# Patient Record
Sex: Female | Born: 1935 | Race: White | Hispanic: No | Marital: Single | Smoking: Former smoker
Health system: Southern US, Community
[De-identification: ages and names within clinical notes are randomized; demographics above are authoritative.]

## PROBLEM LIST (undated history)

## (undated) DIAGNOSIS — F32A Depression, unspecified: Secondary | ICD-10-CM

## (undated) DIAGNOSIS — G4700 Insomnia, unspecified: Secondary | ICD-10-CM

## (undated) DIAGNOSIS — F329 Major depressive disorder, single episode, unspecified: Secondary | ICD-10-CM

## (undated) DIAGNOSIS — L409 Psoriasis, unspecified: Secondary | ICD-10-CM

## (undated) DIAGNOSIS — I10 Essential (primary) hypertension: Secondary | ICD-10-CM

## (undated) DIAGNOSIS — M1990 Unspecified osteoarthritis, unspecified site: Secondary | ICD-10-CM

## (undated) DIAGNOSIS — Z8673 Personal history of transient ischemic attack (TIA), and cerebral infarction without residual deficits: Secondary | ICD-10-CM

## (undated) DIAGNOSIS — L309 Dermatitis, unspecified: Secondary | ICD-10-CM

## (undated) DIAGNOSIS — D649 Anemia, unspecified: Secondary | ICD-10-CM

## (undated) DIAGNOSIS — E039 Hypothyroidism, unspecified: Secondary | ICD-10-CM

## (undated) DIAGNOSIS — F319 Bipolar disorder, unspecified: Secondary | ICD-10-CM

## (undated) HISTORY — DX: Depression, unspecified: F32.A

## (undated) HISTORY — DX: Unspecified osteoarthritis, unspecified site: M19.90

## (undated) HISTORY — DX: Essential (primary) hypertension: I10

## (undated) HISTORY — DX: Hypothyroidism, unspecified: E03.9

## (undated) HISTORY — DX: Major depressive disorder, single episode, unspecified: F32.9

## (undated) HISTORY — PX: EYE SURGERY: SHX253

## (undated) HISTORY — DX: Insomnia, unspecified: G47.00

## (undated) HISTORY — DX: Personal history of transient ischemic attack (TIA), and cerebral infarction without residual deficits: Z86.73

## (undated) HISTORY — DX: Psoriasis, unspecified: L40.9

## (undated) HISTORY — PX: HERNIA REPAIR: SHX51

## (undated) HISTORY — DX: Bipolar disorder, unspecified: F31.9

## (undated) HISTORY — PX: TONSILLECTOMY: SUR1361

## (undated) HISTORY — DX: Dermatitis, unspecified: L30.9

---
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
## 1969-06-18 HISTORY — PX: TONSILLECTOMY: SHX5217

## 1969-06-18 HISTORY — PX: NASAL SEPTUM SURGERY: SHX37

## 1980-10-18 HISTORY — PX: ABDOMINAL HYSTERECTOMY: SHX81

## 2001-10-18 HISTORY — PX: APPENDECTOMY: SHX54

## 2003-09-27 ENCOUNTER — Observation Stay (HOSPITAL_COMMUNITY): Admission: RE | Admit: 2003-09-27 | Discharge: 2003-09-28 | Payer: Self-pay | Admitting: General Surgery

## 2003-09-28 ENCOUNTER — Emergency Department (HOSPITAL_COMMUNITY): Admission: EM | Admit: 2003-09-28 | Discharge: 2003-09-28 | Payer: Self-pay | Admitting: Emergency Medicine

## 2003-10-16 ENCOUNTER — Encounter: Admission: RE | Admit: 2003-10-16 | Discharge: 2003-10-16 | Payer: Self-pay | Admitting: General Surgery

## 2003-10-19 HISTORY — PX: VENTRAL HERNIA REPAIR: SHX424

## 2004-06-30 ENCOUNTER — Encounter: Admission: RE | Admit: 2004-06-30 | Discharge: 2004-06-30 | Payer: Self-pay | Admitting: Family Medicine

## 2004-11-30 ENCOUNTER — Emergency Department (HOSPITAL_COMMUNITY): Admission: EM | Admit: 2004-11-30 | Discharge: 2004-11-30 | Payer: Self-pay | Admitting: Emergency Medicine

## 2005-03-03 ENCOUNTER — Ambulatory Visit: Payer: Self-pay | Admitting: Internal Medicine

## 2005-06-09 ENCOUNTER — Ambulatory Visit: Payer: Self-pay | Admitting: Internal Medicine

## 2005-06-10 ENCOUNTER — Ambulatory Visit: Payer: Self-pay | Admitting: Internal Medicine

## 2005-06-22 ENCOUNTER — Ambulatory Visit: Payer: Self-pay | Admitting: Internal Medicine

## 2005-07-19 ENCOUNTER — Ambulatory Visit: Payer: Self-pay | Admitting: Internal Medicine

## 2005-10-05 ENCOUNTER — Ambulatory Visit: Payer: Self-pay | Admitting: Internal Medicine

## 2005-10-14 ENCOUNTER — Ambulatory Visit: Payer: Self-pay | Admitting: Internal Medicine

## 2005-10-15 ENCOUNTER — Encounter: Payer: Self-pay | Admitting: Cardiology

## 2005-10-15 ENCOUNTER — Ambulatory Visit: Payer: Self-pay

## 2005-12-23 ENCOUNTER — Ambulatory Visit: Payer: Self-pay | Admitting: Family Medicine

## 2006-08-29 ENCOUNTER — Ambulatory Visit: Payer: Self-pay | Admitting: Family Medicine

## 2006-09-06 ENCOUNTER — Ambulatory Visit: Payer: Self-pay | Admitting: Family Medicine

## 2006-09-06 LAB — CONVERTED CEMR LAB
ALT: 24 units/L (ref 0–40)
AST: 32 units/L (ref 0–37)
Albumin: 3.9 g/dL (ref 3.5–5.2)
Alkaline Phosphatase: 27 units/L — ABNORMAL LOW (ref 39–117)
BUN: 16 mg/dL (ref 6–23)
Basophils Absolute: 0 10*3/uL (ref 0.0–0.1)
Basophils Relative: 0.8 % (ref 0.0–1.0)
CO2: 29 meq/L (ref 19–32)
Calcium: 9.5 mg/dL (ref 8.4–10.5)
Chloride: 108 meq/L (ref 96–112)
Chol/HDL Ratio, serum: 3.7
Cholesterol: 185 mg/dL (ref 0–200)
Creatinine, Ser: 1 mg/dL (ref 0.4–1.2)
Eosinophil percent: 4.9 % (ref 0.0–5.0)
GFR calc non Af Amer: 58 mL/min
Glomerular Filtration Rate, Af Am: 70 mL/min/{1.73_m2}
Glucose, Bld: 88 mg/dL (ref 70–99)
HCT: 39.5 % (ref 36.0–46.0)
HDL: 50.2 mg/dL (ref >39.0)
Hemoglobin: 13.7 g/dL (ref 12.0–15.0)
LDL Cholesterol: 119 mg/dL — ABNORMAL HIGH (ref 0–99)
Lymphocytes Relative: 59.4 % — ABNORMAL HIGH (ref 12.0–46.0)
MCHC: 34.7 g/dL (ref 30.0–36.0)
MCV: 96.2 fL (ref 78.0–100.0)
Monocytes Absolute: 0.5 10*3/uL (ref 0.2–0.7)
Monocytes Relative: 9.1 % (ref 3.0–11.0)
Neutro Abs: 1.3 10*3/uL — ABNORMAL LOW (ref 1.4–7.7)
Neutrophils Relative %: 25.8 % — ABNORMAL LOW (ref 43.0–77.0)
Platelets: 203 10*3/uL (ref 150–400)
Potassium: 4.2 meq/L (ref 3.5–5.1)
RBC: 4.11 M/uL (ref 3.87–5.11)
RDW: 13.6 % (ref 11.5–14.6)
Sodium: 144 meq/L (ref 135–145)
TSH: 2.69 microintl units/mL (ref 0.35–5.50)
Total Bilirubin: 0.9 mg/dL (ref 0.3–1.2)
Total Protein: 7 g/dL (ref 6.0–8.3)
Triglyceride fasting, serum: 80 mg/dL (ref 0–149)
VLDL: 16 mg/dL (ref 0–40)
WBC: 5.1 10*3/uL (ref 4.5–10.5)

## 2006-09-14 ENCOUNTER — Encounter: Admission: RE | Admit: 2006-09-14 | Discharge: 2006-09-14 | Payer: Self-pay | Admitting: Family Medicine

## 2007-03-23 ENCOUNTER — Encounter: Payer: Self-pay | Admitting: Family Medicine

## 2007-05-22 ENCOUNTER — Telehealth (INDEPENDENT_AMBULATORY_CARE_PROVIDER_SITE_OTHER): Payer: Self-pay | Admitting: *Deleted

## 2007-08-11 ENCOUNTER — Telehealth (INDEPENDENT_AMBULATORY_CARE_PROVIDER_SITE_OTHER): Payer: Self-pay | Admitting: *Deleted

## 2007-08-15 ENCOUNTER — Telehealth (INDEPENDENT_AMBULATORY_CARE_PROVIDER_SITE_OTHER): Payer: Self-pay | Admitting: *Deleted

## 2007-09-01 ENCOUNTER — Ambulatory Visit: Payer: Self-pay | Admitting: Family Medicine

## 2007-09-01 DIAGNOSIS — M810 Age-related osteoporosis without current pathological fracture: Secondary | ICD-10-CM | POA: Insufficient documentation

## 2007-09-01 DIAGNOSIS — L259 Unspecified contact dermatitis, unspecified cause: Secondary | ICD-10-CM | POA: Insufficient documentation

## 2007-09-01 DIAGNOSIS — L409 Psoriasis, unspecified: Secondary | ICD-10-CM | POA: Insufficient documentation

## 2007-09-01 DIAGNOSIS — G4700 Insomnia, unspecified: Secondary | ICD-10-CM | POA: Insufficient documentation

## 2007-09-01 DIAGNOSIS — E039 Hypothyroidism, unspecified: Secondary | ICD-10-CM | POA: Insufficient documentation

## 2007-09-19 ENCOUNTER — Ambulatory Visit: Payer: Self-pay | Admitting: Pulmonary Disease

## 2007-09-20 ENCOUNTER — Encounter: Payer: Self-pay | Admitting: Pulmonary Disease

## 2007-09-23 ENCOUNTER — Encounter: Payer: Self-pay | Admitting: Pulmonary Disease

## 2007-09-27 ENCOUNTER — Encounter: Payer: Self-pay | Admitting: Family Medicine

## 2007-12-21 ENCOUNTER — Ambulatory Visit: Payer: Self-pay | Admitting: Family Medicine

## 2007-12-21 DIAGNOSIS — J342 Deviated nasal septum: Secondary | ICD-10-CM | POA: Insufficient documentation

## 2007-12-21 DIAGNOSIS — M1990 Unspecified osteoarthritis, unspecified site: Secondary | ICD-10-CM | POA: Insufficient documentation

## 2007-12-21 DIAGNOSIS — D126 Benign neoplasm of colon, unspecified: Secondary | ICD-10-CM | POA: Insufficient documentation

## 2007-12-21 DIAGNOSIS — F319 Bipolar disorder, unspecified: Secondary | ICD-10-CM | POA: Insufficient documentation

## 2007-12-21 DIAGNOSIS — K439 Ventral hernia without obstruction or gangrene: Secondary | ICD-10-CM | POA: Insufficient documentation

## 2007-12-25 ENCOUNTER — Encounter (INDEPENDENT_AMBULATORY_CARE_PROVIDER_SITE_OTHER): Payer: Self-pay | Admitting: *Deleted

## 2008-01-10 ENCOUNTER — Ambulatory Visit: Payer: Self-pay | Admitting: Gastroenterology

## 2008-02-02 ENCOUNTER — Encounter: Payer: Self-pay | Admitting: Family Medicine

## 2008-02-02 ENCOUNTER — Ambulatory Visit: Payer: Self-pay | Admitting: Gastroenterology

## 2008-02-02 ENCOUNTER — Encounter: Payer: Self-pay | Admitting: Gastroenterology

## 2008-03-07 ENCOUNTER — Telehealth (INDEPENDENT_AMBULATORY_CARE_PROVIDER_SITE_OTHER): Payer: Self-pay | Admitting: *Deleted

## 2008-04-01 ENCOUNTER — Encounter (INDEPENDENT_AMBULATORY_CARE_PROVIDER_SITE_OTHER): Payer: Self-pay | Admitting: *Deleted

## 2008-04-01 ENCOUNTER — Ambulatory Visit: Payer: Self-pay | Admitting: Family Medicine

## 2008-04-09 ENCOUNTER — Encounter (INDEPENDENT_AMBULATORY_CARE_PROVIDER_SITE_OTHER): Payer: Self-pay | Admitting: *Deleted

## 2008-04-22 ENCOUNTER — Telehealth (INDEPENDENT_AMBULATORY_CARE_PROVIDER_SITE_OTHER): Payer: Self-pay | Admitting: *Deleted

## 2008-04-23 ENCOUNTER — Ambulatory Visit: Payer: Self-pay | Admitting: Family Medicine

## 2008-04-23 DIAGNOSIS — N390 Urinary tract infection, site not specified: Secondary | ICD-10-CM | POA: Insufficient documentation

## 2008-04-23 LAB — CONVERTED CEMR LAB
Bilirubin Urine: NEGATIVE
Glucose, Urine, Semiquant: NEGATIVE
Ketones, urine, test strip: NEGATIVE
Nitrite: POSITIVE
Protein, U semiquant: 30
Specific Gravity, Urine: <1.005
Urobilinogen, UA: NEGATIVE
pH: 5.5

## 2008-04-25 ENCOUNTER — Telehealth (INDEPENDENT_AMBULATORY_CARE_PROVIDER_SITE_OTHER): Payer: Self-pay | Admitting: *Deleted

## 2008-07-03 ENCOUNTER — Encounter: Payer: Self-pay | Admitting: Family Medicine

## 2008-07-17 ENCOUNTER — Telehealth (INDEPENDENT_AMBULATORY_CARE_PROVIDER_SITE_OTHER): Payer: Self-pay | Admitting: *Deleted

## 2008-08-05 ENCOUNTER — Telehealth (INDEPENDENT_AMBULATORY_CARE_PROVIDER_SITE_OTHER): Payer: Self-pay | Admitting: *Deleted

## 2008-09-09 ENCOUNTER — Telehealth (INDEPENDENT_AMBULATORY_CARE_PROVIDER_SITE_OTHER): Payer: Self-pay | Admitting: *Deleted

## 2008-09-16 ENCOUNTER — Telehealth (INDEPENDENT_AMBULATORY_CARE_PROVIDER_SITE_OTHER): Payer: Self-pay | Admitting: *Deleted

## 2008-09-16 ENCOUNTER — Telehealth: Payer: Self-pay | Admitting: Family Medicine

## 2008-09-23 ENCOUNTER — Encounter: Payer: Self-pay | Admitting: Family Medicine

## 2008-09-24 ENCOUNTER — Telehealth (INDEPENDENT_AMBULATORY_CARE_PROVIDER_SITE_OTHER): Payer: Self-pay | Admitting: *Deleted

## 2008-10-18 HISTORY — PX: CATARACT EXTRACTION: SUR2

## 2008-10-23 ENCOUNTER — Telehealth (INDEPENDENT_AMBULATORY_CARE_PROVIDER_SITE_OTHER): Payer: Self-pay | Admitting: *Deleted

## 2008-11-25 ENCOUNTER — Ambulatory Visit: Payer: Self-pay | Admitting: Family Medicine

## 2008-11-25 DIAGNOSIS — J019 Acute sinusitis, unspecified: Secondary | ICD-10-CM | POA: Insufficient documentation

## 2008-11-25 DIAGNOSIS — J029 Acute pharyngitis, unspecified: Secondary | ICD-10-CM | POA: Insufficient documentation

## 2008-11-25 LAB — CONVERTED CEMR LAB: Rapid Strep: NEGATIVE

## 2008-12-25 ENCOUNTER — Telehealth (INDEPENDENT_AMBULATORY_CARE_PROVIDER_SITE_OTHER): Payer: Self-pay | Admitting: *Deleted

## 2008-12-30 ENCOUNTER — Ambulatory Visit: Payer: Self-pay | Admitting: Family Medicine

## 2008-12-30 LAB — CONVERTED CEMR LAB
ALT: 21 units/L (ref 0–35)
AST: 24 units/L (ref 0–37)
Albumin: 3.9 g/dL (ref 3.5–5.2)
Alkaline Phosphatase: 39 units/L (ref 39–117)
BUN: 14 mg/dL (ref 6–23)
Bilirubin, Direct: 0.2 mg/dL (ref 0.0–0.3)
CO2: 30 meq/L (ref 19–32)
Calcium: 9.7 mg/dL (ref 8.4–10.5)
Chloride: 105 meq/L (ref 96–112)
Cholesterol: 143 mg/dL (ref 0–200)
Creatinine, Ser: 0.8 mg/dL (ref 0.4–1.2)
GFR calc Af Amer: 90 mL/min
GFR calc non Af Amer: 75 mL/min
Glucose, Bld: 92 mg/dL (ref 70–99)
HDL: 42 mg/dL (ref >39.0)
Hgb A1c MFr Bld: 5.6 % (ref 4.6–6.0)
LDL Cholesterol: 82 mg/dL (ref 0–99)
Potassium: 4.1 meq/L (ref 3.5–5.1)
Sodium: 142 meq/L (ref 135–145)
Total Bilirubin: 1 mg/dL (ref 0.3–1.2)
Total CHOL/HDL Ratio: 3.4
Total Protein: 7.6 g/dL (ref 6.0–8.3)
Triglycerides: 95 mg/dL (ref 0–149)
VLDL: 19 mg/dL (ref 0–40)

## 2008-12-31 ENCOUNTER — Encounter (INDEPENDENT_AMBULATORY_CARE_PROVIDER_SITE_OTHER): Payer: Self-pay | Admitting: *Deleted

## 2008-12-31 LAB — CONVERTED CEMR LAB: Vit D, 25-Hydroxy: 88 ng/mL (ref 30–89)

## 2009-01-01 ENCOUNTER — Encounter (INDEPENDENT_AMBULATORY_CARE_PROVIDER_SITE_OTHER): Payer: Self-pay | Admitting: *Deleted

## 2009-01-20 ENCOUNTER — Telehealth: Payer: Self-pay | Admitting: Family Medicine

## 2009-05-07 ENCOUNTER — Telehealth (INDEPENDENT_AMBULATORY_CARE_PROVIDER_SITE_OTHER): Payer: Self-pay | Admitting: *Deleted

## 2009-05-09 ENCOUNTER — Ambulatory Visit: Payer: Self-pay | Admitting: Family Medicine

## 2009-05-09 DIAGNOSIS — R300 Dysuria: Secondary | ICD-10-CM | POA: Insufficient documentation

## 2009-05-09 DIAGNOSIS — N393 Stress incontinence (female) (male): Secondary | ICD-10-CM | POA: Insufficient documentation

## 2009-05-09 LAB — CONVERTED CEMR LAB
Bilirubin Urine: NEGATIVE
Glucose, Urine, Semiquant: NEGATIVE
Ketones, urine, test strip: NEGATIVE
Nitrite: NEGATIVE
Protein, U semiquant: NEGATIVE
Specific Gravity, Urine: <1.005
Urobilinogen, UA: 0.2
WBC Urine, dipstick: NEGATIVE
pH: 6

## 2009-05-12 ENCOUNTER — Telehealth (INDEPENDENT_AMBULATORY_CARE_PROVIDER_SITE_OTHER): Payer: Self-pay | Admitting: *Deleted

## 2009-05-19 ENCOUNTER — Telehealth (INDEPENDENT_AMBULATORY_CARE_PROVIDER_SITE_OTHER): Payer: Self-pay | Admitting: *Deleted

## 2009-05-22 ENCOUNTER — Telehealth (INDEPENDENT_AMBULATORY_CARE_PROVIDER_SITE_OTHER): Payer: Self-pay | Admitting: *Deleted

## 2009-05-23 ENCOUNTER — Ambulatory Visit: Payer: Self-pay | Admitting: Family Medicine

## 2009-05-23 ENCOUNTER — Telehealth (INDEPENDENT_AMBULATORY_CARE_PROVIDER_SITE_OTHER): Payer: Self-pay | Admitting: *Deleted

## 2009-05-23 LAB — CONVERTED CEMR LAB
Bilirubin Urine: NEGATIVE
Glucose, Urine, Semiquant: NEGATIVE
Ketones, urine, test strip: NEGATIVE
Nitrite: POSITIVE
Protein, U semiquant: NEGATIVE
Specific Gravity, Urine: <1.005
Urobilinogen, UA: 0.2
WBC Urine, dipstick: NEGATIVE
pH: 6.5

## 2009-05-24 ENCOUNTER — Encounter: Payer: Self-pay | Admitting: Family Medicine

## 2009-05-26 ENCOUNTER — Telehealth (INDEPENDENT_AMBULATORY_CARE_PROVIDER_SITE_OTHER): Payer: Self-pay | Admitting: *Deleted

## 2009-05-28 ENCOUNTER — Encounter (INDEPENDENT_AMBULATORY_CARE_PROVIDER_SITE_OTHER): Payer: Self-pay | Admitting: *Deleted

## 2009-05-28 ENCOUNTER — Telehealth (INDEPENDENT_AMBULATORY_CARE_PROVIDER_SITE_OTHER): Payer: Self-pay | Admitting: *Deleted

## 2009-05-30 ENCOUNTER — Telehealth (INDEPENDENT_AMBULATORY_CARE_PROVIDER_SITE_OTHER): Payer: Self-pay | Admitting: *Deleted

## 2009-06-02 ENCOUNTER — Ambulatory Visit: Payer: Self-pay | Admitting: Family Medicine

## 2009-06-02 DIAGNOSIS — R110 Nausea: Secondary | ICD-10-CM | POA: Insufficient documentation

## 2009-06-02 DIAGNOSIS — R109 Unspecified abdominal pain: Secondary | ICD-10-CM | POA: Insufficient documentation

## 2009-06-03 ENCOUNTER — Encounter: Admission: RE | Admit: 2009-06-03 | Discharge: 2009-06-03 | Payer: Self-pay | Admitting: Family Medicine

## 2009-06-05 ENCOUNTER — Encounter: Payer: Self-pay | Admitting: Family Medicine

## 2009-06-05 ENCOUNTER — Telehealth: Payer: Self-pay | Admitting: Family Medicine

## 2009-06-12 ENCOUNTER — Encounter (INDEPENDENT_AMBULATORY_CARE_PROVIDER_SITE_OTHER): Payer: Self-pay | Admitting: *Deleted

## 2009-07-21 ENCOUNTER — Encounter: Payer: Self-pay | Admitting: Family Medicine

## 2009-08-05 ENCOUNTER — Telehealth (INDEPENDENT_AMBULATORY_CARE_PROVIDER_SITE_OTHER): Payer: Self-pay | Admitting: *Deleted

## 2009-09-01 ENCOUNTER — Encounter: Payer: Self-pay | Admitting: Family Medicine

## 2009-11-03 ENCOUNTER — Telehealth (INDEPENDENT_AMBULATORY_CARE_PROVIDER_SITE_OTHER): Payer: Self-pay | Admitting: *Deleted

## 2009-11-04 ENCOUNTER — Ambulatory Visit: Payer: Self-pay | Admitting: Family Medicine

## 2009-11-04 DIAGNOSIS — R319 Hematuria, unspecified: Secondary | ICD-10-CM | POA: Insufficient documentation

## 2009-11-04 LAB — CONVERTED CEMR LAB
Bilirubin Urine: NEGATIVE
Glucose, Urine, Semiquant: NEGATIVE
Ketones, urine, test strip: NEGATIVE
Nitrite: NEGATIVE
Protein, U semiquant: NEGATIVE
Specific Gravity, Urine: 1.01
Urobilinogen, UA: 0.2
WBC Urine, dipstick: NEGATIVE
pH: 6

## 2009-11-05 ENCOUNTER — Encounter: Payer: Self-pay | Admitting: Family Medicine

## 2009-11-07 ENCOUNTER — Telehealth (INDEPENDENT_AMBULATORY_CARE_PROVIDER_SITE_OTHER): Payer: Self-pay | Admitting: *Deleted

## 2009-11-18 ENCOUNTER — Ambulatory Visit: Payer: Self-pay | Admitting: Family Medicine

## 2009-11-18 LAB — CONVERTED CEMR LAB
Bilirubin Urine: NEGATIVE
Glucose, Urine, Semiquant: NEGATIVE
Ketones, urine, test strip: NEGATIVE
Nitrite: NEGATIVE
Protein, U semiquant: NEGATIVE
Specific Gravity, Urine: <1.005
Urobilinogen, UA: 0.2
WBC Urine, dipstick: NEGATIVE
pH: 7

## 2009-11-19 ENCOUNTER — Encounter: Payer: Self-pay | Admitting: Family Medicine

## 2010-01-01 ENCOUNTER — Ambulatory Visit: Payer: Self-pay | Admitting: Family Medicine

## 2010-01-01 DIAGNOSIS — K644 Residual hemorrhoidal skin tags: Secondary | ICD-10-CM | POA: Insufficient documentation

## 2010-01-08 ENCOUNTER — Encounter (INDEPENDENT_AMBULATORY_CARE_PROVIDER_SITE_OTHER): Payer: Self-pay | Admitting: *Deleted

## 2010-01-08 ENCOUNTER — Ambulatory Visit: Payer: Self-pay | Admitting: Family Medicine

## 2010-01-08 LAB — CONVERTED CEMR LAB
OCCULT 1: NEGATIVE
OCCULT 2: NEGATIVE
OCCULT 3: NEGATIVE

## 2010-02-09 ENCOUNTER — Encounter: Payer: Self-pay | Admitting: Family Medicine

## 2010-02-23 ENCOUNTER — Ambulatory Visit: Payer: Self-pay | Admitting: Family Medicine

## 2010-02-23 DIAGNOSIS — K589 Irritable bowel syndrome without diarrhea: Secondary | ICD-10-CM | POA: Insufficient documentation

## 2010-02-23 LAB — CONVERTED CEMR LAB
Bilirubin Urine: NEGATIVE
Glucose, Urine, Semiquant: NEGATIVE
Ketones, urine, test strip: NEGATIVE
Nitrite: NEGATIVE
Protein, U semiquant: NEGATIVE
Specific Gravity, Urine: 1.015
Urobilinogen, UA: 0.2
WBC Urine, dipstick: NEGATIVE
pH: 7

## 2010-02-24 ENCOUNTER — Encounter: Payer: Self-pay | Admitting: Family Medicine

## 2010-04-10 ENCOUNTER — Ambulatory Visit: Payer: Self-pay | Admitting: Family Medicine

## 2010-04-10 LAB — CONVERTED CEMR LAB
Bilirubin Urine: NEGATIVE
Glucose, Urine, Semiquant: NEGATIVE
Ketones, urine, test strip: NEGATIVE
Nitrite: NEGATIVE
Protein, U semiquant: NEGATIVE
Specific Gravity, Urine: 1.015
Urobilinogen, UA: 0.2
WBC Urine, dipstick: NEGATIVE
pH: 6

## 2010-05-11 ENCOUNTER — Telehealth (INDEPENDENT_AMBULATORY_CARE_PROVIDER_SITE_OTHER): Payer: Self-pay | Admitting: *Deleted

## 2010-07-06 ENCOUNTER — Encounter: Payer: Self-pay | Admitting: Family Medicine

## 2010-09-02 ENCOUNTER — Telehealth (INDEPENDENT_AMBULATORY_CARE_PROVIDER_SITE_OTHER): Payer: Self-pay | Admitting: *Deleted

## 2010-09-04 ENCOUNTER — Encounter: Payer: Self-pay | Admitting: Family Medicine

## 2010-09-04 ENCOUNTER — Ambulatory Visit: Payer: Self-pay | Admitting: Family Medicine

## 2010-09-04 LAB — CONVERTED CEMR LAB
Bilirubin Urine: NEGATIVE
Glucose, Urine, Semiquant: NEGATIVE
Ketones, urine, test strip: NEGATIVE
Nitrite: NEGATIVE
Protein, U semiquant: NEGATIVE
Specific Gravity, Urine: <1.005
Urobilinogen, UA: NEGATIVE
WBC Urine, dipstick: NEGATIVE
pH: 7

## 2010-09-07 ENCOUNTER — Telehealth (INDEPENDENT_AMBULATORY_CARE_PROVIDER_SITE_OTHER): Payer: Self-pay | Admitting: *Deleted

## 2010-09-07 LAB — CONVERTED CEMR LAB
ALT: 24 units/L (ref 0–35)
AST: 27 units/L (ref 0–37)
Albumin: 4.3 g/dL (ref 3.5–5.2)
Alkaline Phosphatase: 33 units/L — ABNORMAL LOW (ref 39–117)
BUN: 11 mg/dL (ref 6–23)
Basophils Absolute: 0 10*3/uL (ref 0.0–0.1)
Basophils Relative: 0.3 % (ref 0.0–3.0)
Bilirubin, Direct: 0.1 mg/dL (ref 0.0–0.3)
CO2: 29 meq/L (ref 19–32)
Calcium: 9.5 mg/dL (ref 8.4–10.5)
Chloride: 99 meq/L (ref 96–112)
Cholesterol: 204 mg/dL — ABNORMAL HIGH (ref 0–200)
Creatinine, Ser: 0.8 mg/dL (ref 0.4–1.2)
Direct LDL: 123.7 mg/dL
Eosinophils Absolute: 0 10*3/uL (ref 0.0–0.7)
Eosinophils Relative: 0.5 % (ref 0.0–5.0)
GFR calc non Af Amer: 73.23 mL/min (ref >60)
Glucose, Bld: 96 mg/dL (ref 70–99)
HCT: 42.7 % (ref 36.0–46.0)
HDL: 64.8 mg/dL (ref >39.00)
Hemoglobin: 14.6 g/dL (ref 12.0–15.0)
Lymphocytes Relative: 16.4 % (ref 12.0–46.0)
Lymphs Abs: 1.6 10*3/uL (ref 0.7–4.0)
MCHC: 34.2 g/dL (ref 30.0–36.0)
MCV: 94.4 fL (ref 78.0–100.0)
Monocytes Absolute: 1 10*3/uL (ref 0.1–1.0)
Monocytes Relative: 10.2 % (ref 3.0–12.0)
Neutro Abs: 6.9 10*3/uL (ref 1.4–7.7)
Neutrophils Relative %: 72.6 % (ref 43.0–77.0)
Platelets: 250 10*3/uL (ref 150.0–400.0)
Potassium: 4.1 meq/L (ref 3.5–5.1)
RBC: 4.52 M/uL (ref 3.87–5.11)
RDW: 14 % (ref 11.5–14.6)
Sodium: 139 meq/L (ref 135–145)
TSH: 3.87 microintl units/mL (ref 0.35–5.50)
Total Bilirubin: 0.8 mg/dL (ref 0.3–1.2)
Total CHOL/HDL Ratio: 3
Total Protein: 7.4 g/dL (ref 6.0–8.3)
Triglycerides: 50 mg/dL (ref 0.0–149.0)
VLDL: 10 mg/dL (ref 0.0–40.0)
WBC: 9.5 10*3/uL (ref 4.5–10.5)

## 2010-10-02 ENCOUNTER — Ambulatory Visit: Payer: Self-pay | Admitting: Family Medicine

## 2010-10-02 ENCOUNTER — Encounter: Payer: Self-pay | Admitting: Family Medicine

## 2010-10-02 LAB — CONVERTED CEMR LAB
Bilirubin Urine: NEGATIVE
Blood in Urine, dipstick: NEGATIVE
Glucose, Urine, Semiquant: NEGATIVE
Ketones, urine, test strip: NEGATIVE
Nitrite: NEGATIVE
Protein, U semiquant: NEGATIVE
Specific Gravity, Urine: 1.015
Urobilinogen, UA: 0.2
WBC Urine, dipstick: NEGATIVE
pH: 6.5

## 2010-11-09 ENCOUNTER — Telehealth (INDEPENDENT_AMBULATORY_CARE_PROVIDER_SITE_OTHER): Payer: Self-pay | Admitting: *Deleted

## 2010-11-10 ENCOUNTER — Encounter: Payer: Self-pay | Admitting: Family Medicine

## 2010-11-10 ENCOUNTER — Telehealth (INDEPENDENT_AMBULATORY_CARE_PROVIDER_SITE_OTHER): Payer: Self-pay | Admitting: *Deleted

## 2010-11-15 LAB — CONVERTED CEMR LAB
ALT: 16 units/L (ref 0–35)
ALT: 16 units/L (ref 0–35)
ALT: 23 units/L (ref 0–35)
AST: 21 units/L (ref 0–37)
AST: 23 units/L (ref 0–37)
AST: 26 units/L (ref 0–37)
Albumin: 3.9 g/dL (ref 3.5–5.2)
Albumin: 4 g/dL (ref 3.5–5.2)
Albumin: 4 g/dL (ref 3.5–5.2)
Alkaline Phosphatase: 31 units/L — ABNORMAL LOW (ref 39–117)
Alkaline Phosphatase: 62 units/L (ref 39–117)
Alkaline Phosphatase: 65 units/L (ref 39–117)
BUN: 12 mg/dL (ref 6–23)
BUN: 18 mg/dL (ref 6–23)
Basophils Absolute: 0 10*3/uL (ref 0.0–0.1)
Basophils Absolute: 0.1 10*3/uL (ref 0.0–0.1)
Basophils Relative: 0 % (ref 0.0–3.0)
Basophils Relative: 0.9 % (ref 0.0–1.0)
Bilirubin, Direct: 0.1 mg/dL (ref 0.0–0.3)
Bilirubin, Direct: 0.1 mg/dL (ref 0.0–0.3)
Bilirubin, Direct: 0.1 mg/dL (ref 0.0–0.3)
CO2: 30 meq/L (ref 19–32)
CO2: 30 meq/L (ref 19–32)
Calcium: 10.3 mg/dL (ref 8.4–10.5)
Calcium: 9.4 mg/dL (ref 8.4–10.5)
Chloride: 105 meq/L (ref 96–112)
Chloride: 109 meq/L (ref 96–112)
Cholesterol: 203 mg/dL (ref 0–200)
Creatinine, Ser: 0.8 mg/dL (ref 0.4–1.2)
Creatinine, Ser: 0.9 mg/dL (ref 0.4–1.2)
Direct LDL: 134.2 mg/dL
Eosinophils Absolute: 0.1 10*3/uL (ref 0.0–0.7)
Eosinophils Absolute: 0.2 10*3/uL (ref 0.0–0.6)
Eosinophils Relative: 1.7 % (ref 0.0–5.0)
Eosinophils Relative: 2.1 % (ref 0.0–5.0)
Folate: 16.6 ng/mL
GFR calc Af Amer: 79 mL/min
GFR calc non Af Amer: 65 mL/min
GFR calc non Af Amer: 74.54 mL/min (ref >60)
Glucose, Bld: 101 mg/dL — ABNORMAL HIGH (ref 70–99)
Glucose, Bld: 87 mg/dL (ref 70–99)
HCT: 39.7 % (ref 36.0–46.0)
HCT: 41 % (ref 36.0–46.0)
HDL: 38.2 mg/dL — ABNORMAL LOW (ref >39.0)
Hemoglobin: 13.7 g/dL (ref 12.0–15.0)
Hemoglobin: 13.9 g/dL (ref 12.0–15.0)
Lymphocytes Relative: 25.6 % (ref 12.0–46.0)
Lymphocytes Relative: 30.7 % (ref 12.0–46.0)
Lymphs Abs: 1.9 10*3/uL (ref 0.7–4.0)
MCHC: 33.3 g/dL (ref 30.0–36.0)
MCHC: 35 g/dL (ref 30.0–36.0)
MCV: 93.3 fL (ref 78.0–100.0)
MCV: 94.6 fL (ref 78.0–100.0)
Monocytes Absolute: 0.2 10*3/uL (ref 0.1–1.0)
Monocytes Absolute: 0.8 10*3/uL — ABNORMAL HIGH (ref 0.2–0.7)
Monocytes Relative: 10 % (ref 3.0–11.0)
Monocytes Relative: 3.3 % (ref 3.0–12.0)
Neutro Abs: 4.4 10*3/uL (ref 1.4–7.7)
Neutro Abs: 5.2 10*3/uL (ref 1.4–7.7)
Neutrophils Relative %: 56.3 % (ref 43.0–77.0)
Neutrophils Relative %: 69.4 % (ref 43.0–77.0)
Platelets: 225 10*3/uL (ref 150.0–400.0)
Platelets: 257 10*3/uL (ref 150–400)
Potassium: 3.9 meq/L (ref 3.5–5.1)
Potassium: 4.5 meq/L (ref 3.5–5.1)
RBC: 4.26 M/uL (ref 3.87–5.11)
RBC: 4.33 M/uL (ref 3.87–5.11)
RDW: 12.7 % (ref 11.5–14.6)
RDW: 12.7 % (ref 11.5–14.6)
Sodium: 142 meq/L (ref 135–145)
Sodium: 143 meq/L (ref 135–145)
TSH: 1.19 microintl units/mL (ref 0.35–5.50)
TSH: 1.6 microintl units/mL (ref 0.35–5.50)
Total Bilirubin: 0.7 mg/dL (ref 0.3–1.2)
Total Bilirubin: 0.8 mg/dL (ref 0.3–1.2)
Total Bilirubin: 0.8 mg/dL (ref 0.3–1.2)
Total CHOL/HDL Ratio: 5.3
Total Protein: 7.1 g/dL (ref 6.0–8.3)
Total Protein: 7.5 g/dL (ref 6.0–8.3)
Total Protein: 7.6 g/dL (ref 6.0–8.3)
Triglycerides: 179 mg/dL — ABNORMAL HIGH (ref 0–149)
VLDL: 36 mg/dL (ref 0–40)
Vit D, 1,25-Dihydroxy: 43 (ref 30–89)
Vitamin B-12: 703 pg/mL (ref 211–911)
WBC: 7.4 10*3/uL (ref 4.5–10.5)
WBC: 8 10*3/uL (ref 4.5–10.5)

## 2010-11-17 NOTE — Letter (Signed)
Summary: Results Follow-up Letter   at [place]  [address] [place], [place] [address]   Phone: [phone]  Fax: [phone]    12/31/2008        [facility] [address] [place], [place]  [address]  Dear Ms. Soh,   The following are the results of your recent test(s):  Test     Result     Pap Smear    Normal_______  Not Normal_____       Comments: _________________________________________________________ Cholesterol LDL(Bad cholesterol):          Your goal is less than:         HDL (Good cholesterol):        Your goal is more than: _________________________________________________________ Other Tests:   _________________________________________________________  Please call for an appointment Or _PLEASE SEE ATTACHED LABWORK.________________________________________________________ _________________________________________________________ _________________________________________________________  Sincerely,  Ardyth Man  at [facility]

## 2010-11-17 NOTE — Letter (Signed)
Summary: Results Follow up Letter  [place] at [place]  [address] [place], [place] [address]   Phone: [phone]  Fax: [phone]    01/08/2010 MRN: [id number]  [facility] [address] [place], [place]  [address]  Dear Ms. Devries,  The following are the results of your recent test(s):  Test         Result    Pap Smear:        Normal _____  Not Normal _____ Comments: ______________________________________________________ Cholesterol: LDL(Bad cholesterol):         Your goal is less than:         HDL (Good cholesterol):       Your goal is more than: Comments:  ______________________________________________________ Mammogram:        Normal _____  Not Normal _____ Comments:  ___________________________________________________________________ Hemoccult:        Normal __X___  Not normal _______ Comments:    _____________________________________________________________________ Other Tests:    We routinely do not discuss normal results over the telephone.  If you desire a copy of the results, or you have any questions about this information we can discuss them at your next office visit.   Sincerely,   Army Fossa CMA  January 08, 2010 4:30 PM

## 2010-11-17 NOTE — Letter (Signed)
Summary: Results Follow-up Letter  [place] at [facility]  [address] [place], [place] [address]   Phone: [phone]  Fax: [phone]    01/01/2009         [name] [name] [address] [place], [place]  [address]  Dear Ms. Geffrard,   The following are the results of your recent test(s):  Test     Result     Pap Smear    Normal_______  Not Normal_____       Comments: _________________________________________________________ Cholesterol LDL(Bad cholesterol):          Your goal is less than:         HDL (Good cholesterol):        Your goal is more than: _________________________________________________________ Other Tests:   _________________________________________________________  Please call for an appointment Or Please see attached labwork. _________________________________________________________ _________________________________________________________  Sincerely,  Ardyth Man [place] at [facility]

## 2010-11-17 NOTE — Progress Notes (Signed)
  Phone Note Outgoing Call   Summary of Call: Left message for patient to call the office in regards to her append on the most recent lab. Ardyth Man  April 25, 2008 1:33 PM   Follow-up for Phone Call        patient aware to change abx from cipro to bactrim ds and is aware that rx sent to pharmacy Ardyth Man  April 25, 2008 3:28 PM  Follow-up by: Ardyth Man,  April 25, 2008 3:28 PM

## 2010-11-17 NOTE — Progress Notes (Signed)
Summary: lowne--rx  Phone Note Refill Request   Refills Requested: Medication #1:  FORTEO 750 MCG/3ML  SOLN SUBCUTANEOUS DAILY  Medication #2:  BD MINI PEN NDL 31 G X5MM as directed [id number]--[phone]  Initial call taken by: Freddy Jaksch,  September 24, 2008 8:43 AM      Prescriptions: BD MINI PEN NDL 70 G X5MM as directed  #3 months x 3   Entered by:   Kandice Hams   Authorized by:   Loreen Freud DO   Signed by:   Kandice Hams on 09/24/2008   Method used:   Printed then faxed to ...       [facility] [address] Environmental education officer)       [address]       [place], [place]  [address]       Ph: [phone]       Fax: [phone]   RxID:   [id number] FORTEO 750 MCG/3ML  SOLN (TERIPARATIDE (RECOMBINANT)) SUBCUTANEOUS DAILY  #3 pens x 3   Entered by:   Kandice Hams   Authorized by:   Loreen Freud DO   Signed by:   Kandice Hams on 09/24/2008   Method used:   Printed then faxed to ...       [facility] [address] Environmental education officer)       [address]       [place], [place]  [address]       Ph: [phone]       Fax: [phone]   RxID:   [id number]

## 2010-11-17 NOTE — Assessment & Plan Note (Signed)
Summary: acute cough,sore throat.cbs   Vital Signs:  Patient Profile:   77 Years Old Female Height:     66 inches Weight:      144.0 pounds O2 Sat:      96 % Temp:     98.0 degrees F oral Pulse rate:   89 / minute BP sitting:   150 / 78  (left arm)  Pt. in pain?   no  Vitals Entered By: Jeremy Johann CMA (November 25, 2008 10:49 AM)                  PCP:  Laury Axon  Chief Complaint:  nasal drainage, sore throat cough up yellow mucous x 4 days, and URI symptoms.  History of Present Illness:  URI Symptoms      This is a 77 year old woman who presents with URI symptoms.  The symptoms began duration > 3 days ago.  Pt here c/o congestion and ST since Thursday.  pt used neti pot with no relief.  The patient reports nasal congestion, purulent nasal discharge, sore throat, and productive cough, but denies sick contacts.  The patient denies fever, low-grade fever (<100.5 degrees), fever of 100.5-103 degrees, fever of 103.1-104 degrees, fever to >104 degrees, stiff neck, dyspnea, wheezing, rash, vomiting, diarrhea, use of an antipyretic, and response to antipyretic.  The patient also reports itchy throat and headache.  The patient denies itchy watery eyes, sneezing, seasonal symptoms, response to antihistamine, muscle aches, and severe fatigue.  The patient denies the following risk factors for Strep sinusitis: unilateral facial pain, unilateral nasal discharge, poor response to decongestant, double sickening, tooth pain, Strep exposure, tender adenopathy, and absence of cough.      Current Allergies: ! [facility]  Past Medical History:    Reviewed history from 12/21/2007 and no changes required:       HYPOTHYROIDISM (ICD-244.9)       INSOMNIA (ICD-780.52)       PSORIASIS (ICD-696.1)       ECZEMA (ICD-692.9)       OSTEOPOROSIS (ICD-733.00)       FAMILY HISTORY OF COLON CA 1ST DEGREE RELATIVE <60 (ICD-V16.0)       BIPOLAR       Osteoarthritis-- LOW BACK   Family History:  Reviewed history from 12/21/2007 and no changes required:       MOTHER:LIVING       FATHER:LIVING       4:BROTHERS-1LIVING       Family History of Colon CA 1st degree relative <60: BROTHER(DECEASED)       Family History Lung cancer: BROTHER(DECEASED), FATHER       Family History of Prostate CA 1st degree relative <50: FATHER       HEART:BROTHER(DECEASED)       Family History of Arthritis:FATHER       Family History of Aneurysm Aortic-- brother (deceased)  Social History:    Reviewed history from 12/21/2007 and no changes required:       Retired-- Engineer, 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[place]       Single       Former Smoker       Alcohol use-no       Drug use-no       Regular exercise-yes   Risk Factors: Tobacco use:  quit    Year quit:  age 39    Pack-years:  20-- 1ppd for 20 years Passive smoke exposure:  no Drug use:  no HIV high-risk behavior:  no Alcohol use:  no Exercise:  yes    Times per week:  7    Type:  WALKING,YOGA Seatbelt use:  100 %  Family History Risk Factors:    Family History of MI in females < 14 years old:  no    Family History of MI in males < 14 years old:  no  Colonoscopy History:    Date of Last Colonoscopy:  01/31/2008  Mammogram History:    Date of Last Mammogram:  11/02/2006   Review of Systems      See HPI   Physical Exam  General:     Well-developed,well-nourished,in no acute distress; alert,appropriate and cooperative throughout examination Ears:     External ear exam shows no significant lesions or deformities.  Otoscopic examination reveals clear canals, tympanic membranes are intact bilaterally without bulging, retraction, inflammation or discharge. Hearing is grossly normal bilaterally. Nose:     L frontal sinus tenderness and R frontal sinus tenderness.   Mouth:     pharyngeal erythema and postnasal drip.   Neck:     No deformities, masses, or tenderness noted. Lungs:     Normal respiratory effort, chest expands symmetrically. Lungs are clear to  auscultation, no crackles or wheezes. Heart:     normal rate, regular rhythm, and no murmur.   Psych:     Cognition and judgment appear intact. Alert and cooperative with normal attention span and concentration. No apparent delusions, illusions, hallucinations    Impression & Recommendations:  Problem # 1:  ACUTE PHARYNGITIS (ICD-462)  The following medications were removed from the medication list:    Bactrim Ds 800-160 Mg Tabs (Sulfamethoxazole-trimethoprim) ..... One tablet by mouth twice daily for 7 days.  Her updated medication list for this problem includes:    Augmentin 875-125 Mg Tabs (Amoxicillin-pot clavulanate) .Marland Kitchen... 1 by mouth two times a day  Orders: Rapid Strep ([address])   Problem # 2:  SINUSITIS- ACUTE-NOS (ICD-461.9)  The following medications were removed from the medication list:    Bactrim Ds 800-160 Mg Tabs (Sulfamethoxazole-trimethoprim) ..... One tablet by mouth twice daily for 7 days.  Her updated medication list for this problem includes:    Augmentin 875-125 Mg Tabs (Amoxicillin-pot clavulanate) .Marland Kitchen... 1 by mouth two times a day    Nasonex 50 Mcg/act Susp (Mometasone furoate) .Marland Kitchen... 2 sprays each nostril once daily Instructed on treatment. Call if symptoms persist or worsen.  Orders: Rapid Strep ([address])   Complete Medication List: 1)  Ambien 5 Mg Tabs (Zolpidem tartrate) .Marland Kitchen.. 1 by mouth at bedtime 2)  Vit-d 1000iu  .Marland Kitchen.. 1 by mouth once daily 3)  Mvi-centrum Silver  .Marland Kitchen.. 1 by mouth once daily 4)  Calcium 600mg   .... 1 by mouth two times a day 5)  Fish Oil 1000 Mg Caps (Omega-3 fatty acids) .Marland Kitchen.. 1 by mouth once daily 6)  Lutein 40 Mg Caps (Lutein) .Marland Kitchen.. 1 by mouth once daily 7)  Bd Mini Pen Ndl 31 G X56mm  .... As directed 8)  Alcohol Prep Pads  .... As directed 9)  Synthroid 75 Mcg Tabs (Levothyroxine sodium) .Marland Kitchen.. 1 by mouth once daily 10)  Abilify 2 Mg Tabs (Aripiprazole) .Marland Kitchen.. 1 by mouth once daily 11)  Zocor 40 Mg Tabs (Simvastatin) .... Take one  tablet each evening at bedtime. 12)  Zocor 40 Mg Tabs (Simvastatin) .Marland Kitchen.. 1 by mouth at bedtime 13)  Fosamax 70 Mg Tabs (Alendronate sodium) .... Take 1 tab weekly 14)  Augmentin 875-125 Mg Tabs (Amoxicillin-pot clavulanate) .Marland Kitchen.. 1 by  mouth two times a day 15)  Nasonex 50 Mcg/act Susp (Mometasone furoate) .... 2 sprays each nostril once daily    Prescriptions: NASONEX 50 MCG/ACT SUSP (MOMETASONE FUROATE) 2 sprays each nostril once daily  #1 x 0   Entered and Authorized by:   Loreen Freud DO   Signed by:   Loreen Freud DO on 11/25/2008   Method used:   Historical   RxID:   [id number] AUGMENTIN 875-125 MG TABS (AMOXICILLIN-POT CLAVULANATE) 1 by mouth two times a day  #20 x 0   Entered and Authorized by:   Loreen Freud DO   Signed by:   Loreen Freud DO on 11/25/2008   Method used:   [phone][facility]       [address]       [place], [place]  [address]       Ph: [phone]       Fax: [phone]   RxID:   [id number]   Laboratory Results  Date/Time Received: November 25, 2008 11:03 AM  Date/Time Reported: November 25, 2008 11:06 AM   Other Tests  Rapid Strep: negative

## 2010-11-17 NOTE — Procedures (Signed)
Summary: Colonoscopy  Colonoscopy   Imported By: Freddy Jaksch 02/12/2008 10:13:34  _____________________________________________________________________  External Attachment:    Type:   Image     Comment:   External Document  Appended Document: Colonoscopy    Clinical Lists Changes  Observations: Added new observation of COLONNXTDUE: 01/2013 (02/14/2008 17:57) Added new observation of COLONOSCOPY: polyp (01/31/2008 18:09)        Preventive Care Screening  Colonoscopy:    Date:  01/31/2008    Next Due:  01/2013    Results:  polyp

## 2010-11-17 NOTE — Progress Notes (Signed)
Summary: UTI   Phone Note Call from Patient   Caller: Patient Summary of Call: pt left VM that she would like a appt for today due to buring and frequency with urination. call pt back pt c/o burning and frequency with urination.. pt denies any fever, pain lower back or side or any  blood in urine. pt has pending OV for tomorrow but advised if symptoms worsen or fever occur advise UC or ED pt ok...............Marland KitchenFelecia Deloach CMA  November 03, 2009 1:24 PM   Initial call taken by: Jeremy Johann CMA,  November 03, 2009 12:29 PM

## 2010-11-17 NOTE — Progress Notes (Signed)
Summary: labs sched [phone]  Phone Note Call from Patient Call back at Home Phone [phone]   Caller: Patient Call For: Loreen Freud DO Summary of Call: Patient is requesting that we order labs to test her thyroid, blood sugar, and cholesterol.  Magdalen Spatz [facility]  December 25, 2008 2:04 PM  Initial call taken by: Magdalen Spatz [facility],  December 25, 2008 2:04 PM  Follow-up for Phone Call        Last labs 04/2008 and last seen 11/2008 for cough. Please advise. Ardyth Man  December 26, 2008 7:46 AM  Follow-up by: Ardyth Man,  December 26, 2008 7:46 AM  Additional Follow-up for Phone Call Additional follow up Details #1::        272.4  bmp, lipid, hep,  244.9  TSH,  vita d Additional Follow-up by: Loreen Freud DO,  December 26, 2008 9:37 AM    Additional Follow-up for Phone Call Additional follow up Details #2::    i called patient to schedule lab appt she asked if blood sugar was going to be checked - she said she takes ambilify & psy told her med could cause level to go up - do you want to add Follow-up by: Okey Regal [place],  December 26, 2008 3:49 PM  Additional Follow-up for Phone Call Additional follow up Details #3:: Details for Additional Follow-up Action Taken: Dr. Laury Axon, Please advise. Ardyth Man  December 26, 2008 3:59 PM Yes add [id number].   added to appt Additional Follow-up by: Okey Regal [place],  December 26, 2008 4:11 PM

## 2010-11-17 NOTE — Letter (Signed)
Summary: [facility]  [facility]   Imported By: Lanelle Bal 02/13/2010 14:10:40  _____________________________________________________________________  External Attachment:    Type:   Image     Comment:   External Document

## 2010-11-17 NOTE — Progress Notes (Signed)
Summary: rx [name] [name]- dr Darcus Austin RX? DR PAZ SEE  Phone Note Call from Patient Call back at Home Phone [phone]   Caller: Patient Summary of Call: pt wanted rx for mail order for  ambian 5mg  - per last refill in aug pt was to make appt - she was last seen 1107 -- patient made appt for [address]   - please fax 30 day supply [facility] [address]   ------patient aware she is to keep appt Initial call taken by: Okey Regal [place],  August 11, 2007 4:31 PM  Follow-up for Phone Call        ok to send a 30 day supply to local pharmacy....................................................................Jose E. Paz MD  August 12, 2007 11:04 AM  rx sent to [facility] pt aware Follow-up by: Kandice Hams,  August 14, 2007 9:24 AM      Prescriptions: AMBIEN 5 MG  TABS (ZOLPIDEM TARTRATE) 1 by mouth at bedtime  #30 x 0   Entered by:   Kandice Hams   Authorized by:   Nolon Rod. Paz MD   Signed by:   Kandice Hams on 08/14/2007   Method used:   Electronically sent to ...       [facility] [phone][facility]       [address]       [place], [place]  [address]       Ph: [phone]       Fax: [phone]   RxID:   [id number]

## 2010-11-17 NOTE — Progress Notes (Signed)
Summary: [facility]--refill  Phone Note Refill Request   Refills Requested: Medication #1:  FORTEO 750 MCG/3ML  SOLN SUBCUTANEOUS DAILY [facility]--[phone]  Initial call taken by: Freddy Jaksch,  September 16, 2008 8:39 AM      Prescriptions: FORTEO 750 MCG/3ML  SOLN (TERIPARATIDE (RECOMBINANT)) SUBCUTANEOUS DAILY  #3 pens x 3   Entered by:   Ardyth Man   Authorized by:   Loreen Freud DO   Signed by:   Ardyth Man on 09/16/2008   Method used:   Printed then faxed to ...       [facility] [phone][facility] (retail)       [address]       [place], [place]  [address]       Ph: [phone]       Fax: [phone]   RxID:   [id number]

## 2010-11-17 NOTE — Miscellaneous (Signed)
Summary: Waiver of Liability  Waiver of Liability   Imported By: Freddy Jaksch 04/18/2008 10:05:06  _____________________________________________________________________  External Attachment:    Type:   Image     Comment:   External Document

## 2010-11-17 NOTE — Assessment & Plan Note (Signed)
Summary: burning when urinating.cbs   Vital Signs:  Patient profile:   77 year old female Height:      66 inches Weight:      148.13 pounds BMI:     24.00 Pulse rate:   92 / minute Pulse rhythm:   regular BP sitting:   126 / 80  (left arm) Cuff size:   regular  Vitals Entered By: Army Fossa CMA (April 10, 2010 10:04 AM) CC: Pt here for lower back pain, frequency and burning with urination., Dysuria   History of Present Illness:  Dysuria      This is a 77 year old woman who presents with Dysuria.  The symptoms began 2 days ago.  The patient complains of burning with urination and urinary frequency, but denies urgency, hematuria, vaginal discharge, vaginal itching, vaginal sores, and penile discharge.  Associated symptoms include abdominal pain and back pain.  The patient denies the following associated symptoms: nausea, vomiting, fever, shaking chills, flank pain, pelvic pain, and arthralgias.  The patient denies the following risk factors: diabetes, prior antibiotics, immunosuppression, history of GU anomaly, history of pyelonephritis, pregnancy, history of STD, and analgesic abuse.  History is significant for recent UTI.    Current Medications (verified): 1)  Ambien 10 Mg Tabs (Zolpidem Tartrate) .Marland Kitchen.. 1 By Mouth At Bedtime 2)  Vit-D 1000iu .Marland Kitchen.. 1 By Mouth Once Daily 3)  Mvi-Centrum Silver .Marland Kitchen.. 1 By Mouth Once Daily 4)  Calcium 600mg  .... 1 By Mouth Two Times A Day 5)  Fish Oil 1000 Mg  Caps (Omega-3 Fatty Acids) .Marland Kitchen.. 1 By Mouth Once Daily 6)  Lutein 40 Mg  Caps (Lutein) .Marland Kitchen.. 1 By Mouth Once Daily 7)  Synthroid 75 Mcg  Tabs (Levothyroxine Sodium) .Marland Kitchen.. 1 By Mouth Once Daily 8)  Abilify 2 Mg  Tabs (Aripiprazole) .... 2  By Mouth Once Daily 9)  Zocor 40 Mg  Tabs (Simvastatin) .Marland Kitchen.. 1 By Mouth At Bedtime 10)  Fosamax 70 Mg Tabs (Alendronate Sodium) .... Take 1 Tab Weekly 11)  Nasonex 50 Mcg/act Susp (Mometasone Furoate) .... 2 Sprays Each Nostril Once Daily 12)  Aciphex 20 Mg Tbec  (Rabeprazole Sodium) .Marland Kitchen.. 1 By Mouth Once Daily 13)  Promethazine Hcl 25 Mg Tabs (Promethazine Hcl) .Marland Kitchen.. 1 By Mouth Qid As Needed 14)  Cipro 500 Mg Tabs (Ciprofloxacin Hcl) .Marland Kitchen.. 1 By Mouth Two Times A Day  Allergies: 1)  ! Wellbutrin 2)  ! * Nitrofurantoin  Past History:  Past medical, surgical, family and social histories (including risk factors) reviewed for relevance to current acute and chronic problems.  Past Medical History: Reviewed history from 12/21/2007 and no changes required. HYPOTHYROIDISM (ICD-244.9) INSOMNIA (ICD-780.52) PSORIASIS (ICD-696.1) ECZEMA (ICD-692.9) OSTEOPOROSIS (ICD-733.00) FAMILY HISTORY OF COLON CA 1ST DEGREE RELATIVE <60 (ICD-V16.0) BIPOLAR Osteoarthritis-- LOW BACK  Past Surgical History: Reviewed history from 12/21/2007 and no changes required. Appendectomy Hysterectomy,  partial Tonsillectomy ventral hernia Lumpectomy  Family History: Reviewed history from 12/21/2007 and no changes required. MOTHER:LIVING FATHER:LIVING 4:BROTHERS-1LIVING Family History of Colon CA 1st degree relative <60: BROTHER(DECEASED) Family History Lung cancer: BROTHER(DECEASED), FATHER Family History of Prostate CA 1st degree relative <50: FATHER HEART:BROTHER(DECEASED) Family History of Arthritis:FATHER Family History of Aneurysm Aortic-- brother (deceased)  Social History: Reviewed history from 12/21/2007 and no changes required. 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 no flank pain Psych:  Oriented X3 and normally interactive.     Impression & Recommendations:  Problem # 1:  DYSURIA (ICD-788.1)  The following  medications were removed from the medication list:    Bactrim Ds 800-160 Mg Tabs (Sulfamethoxazole-trimethoprim) .Marland Kitchen... Take 1 by mouth two times a day for 7 days Her updated medication list for this problem includes:    Cipro 500 Mg Tabs (Ciprofloxacin hcl) .Marland Kitchen... 1 by mouth two times a day  Orders: T-Culture, Urine ([id number])  Encouraged to push clear liquids, get enough rest, and take acetaminophen as needed. To be seen in 10 days if no improvement, sooner if worse.  Complete Medication List: 1)  Ambien 10 Mg Tabs (Zolpidem tartrate) .Marland Kitchen.. 1 by mouth at bedtime 2)  Vit-d 1000iu  .Marland Kitchen.. 1 by mouth once daily 3)  Mvi-centrum Silver  .Marland Kitchen.. 1 by mouth once daily 4)  Calcium 600mg   .... 1 by mouth two times a day 5)  Fish Oil 1000 Mg Caps (Omega-3 fatty acids) .Marland Kitchen.. 1 by mouth once daily 6)  Lutein 40 Mg Caps (Lutein) .Marland Kitchen.. 1 by mouth once daily 7)  Synthroid 75 Mcg Tabs (Levothyroxine sodium) .Marland Kitchen.. 1 by mouth once daily 8)  Abilify 2 Mg Tabs (Aripiprazole) .... 2  by mouth once daily 9)  Zocor 40 Mg Tabs (Simvastatin) .Marland Kitchen.. 1 by mouth at bedtime 10)  Fosamax 70 Mg Tabs (Alendronate sodium) .... Take 1 tab weekly 11)  Nasonex 50 Mcg/act Susp (Mometasone furoate) .... 2 sprays each nostril once daily 12)  Aciphex 20 Mg Tbec (Rabeprazole sodium) .Marland Kitchen.. 1 by mouth once daily 13)  Promethazine Hcl 25 Mg Tabs (Promethazine hcl) .Marland Kitchen.. 1 by mouth qid as needed 14)  Cipro 500 Mg Tabs (Ciprofloxacin hcl) .Marland Kitchen.. 1 by mouth two times a day Prescriptions: CIPRO 500 MG TABS (CIPROFLOXACIN HCL) 1 by mouth two times a day  #10 x 0   Entered and Authorized by:   Loreen Freud DO   Signed by:   Loreen Freud DO on 04/10/2010   Method used:   [facility]        [facility] [phone][facility] (retail)       [address]       [place], [place]  [address]       Ph: [phone]       Fax: [phone]   RxID:   [id number]   Laboratory Results   Urine Tests    Routine Urinalysis    Color: yellow Appearance: Clear Glucose: negative   (Normal Range: Negative) Bilirubin: negative   (Normal Range: Negative) Ketone: negative   (Normal Range: Negative) Spec. Gravity: 1.015   (Normal Range: 1.003-1.035) Blood: trace-lysed   (Normal Range: Negative) pH: 6.0   (Normal Range: 5.0-8.0) Protein: negative   (Normal Range: Negative) Urobilinogen: 0.2   (Normal Range: 0-1) Nitrite: negative   (Normal Range: Negative) Leukocyte Esterace: negative   (Normal Range: Negative)    Comments: Army Fossa CMA  April 10, 2010 10:13 AM

## 2010-11-17 NOTE — Consult Note (Signed)
Summary: [facility]  [facility]   Imported By: Lanelle Bal 06/12/2009 10:46:09  _____________________________________________________________________  External Attachment:    Type:   Image     Comment:   External Document

## 2010-11-17 NOTE — Progress Notes (Signed)
Summary: orders  Phone Note Call from Patient   Caller: Patient Summary of Call: pt  needs order for bone density  pls fax over the order for [facility]  [phone]   dec 7  Initial call taken by: [facility] CMA,  September 16, 2008 4:08 PM  Follow-up for Phone Call        dr lowne pls advise on orders for bone density Follow-up by: Jeremy Johann CMA,  September 16, 2008 4:53 PM  Additional Follow-up for Phone Call Additional follow up Details #1::        done Additional Follow-up by: Loreen Freud DO,  September 17, 2008 9:08 AM    Additional Follow-up for Phone Call Additional follow up Details #2::    LEFT DETAIL MESSAGE FOR PT THAT ORDER HAD BEEN DONE Follow-up by: Jeremy Johann CMA,  September 17, 2008 1:47 PM

## 2010-11-17 NOTE — Assessment & Plan Note (Signed)
Summary: UTI symptoms//lch   Vital Signs:  Patient profile:   77 year old female Weight:      147 pounds Pulse rate:   88 / minute Pulse rhythm:   regular BP sitting:   128 / 82  (left arm) Cuff size:   regular  Vitals Entered By: Army Fossa CMA (Feb 23, 2010 2:41 PM) CC: Pt here for burning and frequency when urinating onset friday, Dysuria   History of Present Illness:  Dysuria      This is a 77 year old woman who presents with Dysuria.  The symptoms began 4 days ago.  The patient complains of burning with urination, urinary frequency, and hematuria, but denies urgency, vaginal discharge, vaginal itching, vaginal sores, and penile discharge.  The patient denies the following associated symptoms: nausea, vomiting, fever, shaking chills, flank pain, abdominal pain, back pain, pelvic pain, and arthralgias.  The patient denies the following risk factors: diabetes, prior antibiotics, immunosuppression, history of GU anomaly, history of pyelonephritis, pregnancy, history of STD, and analgesic abuse.  History is significant for recent UTI.  Pt sees urology regularly for incontinence and UA done at every visit.   Current Medications (verified): 1)  Ambien 10 Mg Tabs (Zolpidem Tartrate) .Marland Kitchen.. 1 By Mouth At Bedtime 2)  Vit-D 1000iu .Marland Kitchen.. 1 By Mouth Once Daily 3)  Mvi-Centrum Silver .Marland Kitchen.. 1 By Mouth Once Daily 4)  Calcium 600mg  .... 1 By Mouth Two Times A Day 5)  Fish Oil 1000 Mg  Caps (Omega-3 Fatty Acids) .Marland Kitchen.. 1 By Mouth Once Daily 6)  Lutein 40 Mg  Caps (Lutein) .Marland Kitchen.. 1 By Mouth Once Daily 7)  Synthroid 75 Mcg  Tabs (Levothyroxine Sodium) .Marland Kitchen.. 1 By Mouth Once Daily 8)  Abilify 2 Mg  Tabs (Aripiprazole) .... 2  By Mouth Once Daily 9)  Zocor 40 Mg  Tabs (Simvastatin) .... Take One Tablet Each Evening At Bedtime. 10)  Zocor 40 Mg  Tabs (Simvastatin) .Marland Kitchen.. 1 By Mouth At Bedtime 11)  Fosamax 70 Mg Tabs (Alendronate Sodium) .... Take 1 Tab Weekly 12)  Nasonex 50 Mcg/act Susp (Mometasone Furoate)  .... 2 Sprays Each Nostril Once Daily 13)  Aciphex 20 Mg Tbec (Rabeprazole Sodium) .Marland Kitchen.. 1 By Mouth Once Daily 14)  Promethazine Hcl 25 Mg Tabs (Promethazine Hcl) .Marland Kitchen.. 1 By Mouth Qid As Needed 15)  Bactrim Ds 800-160 Mg Tabs (Sulfamethoxazole-Trimethoprim) .... Take 1 By Mouth Two Times A Day For 7 Days 16)  Bactrim Ds 800-160 Mg Tabs (Sulfamethoxazole-Trimethoprim) .Marland Kitchen.. 1 By Mouth Two Times A Day  Allergies: 1)  ! Wellbutrin 2)  ! * Nitrofurantoin  Past History:  Past medical, surgical, family and social histories (including risk factors) reviewed for relevance to current acute and chronic problems.  Past Medical History: Reviewed history from 12/21/2007 and no changes required. HYPOTHYROIDISM (ICD-244.9) INSOMNIA (ICD-780.52) PSORIASIS (ICD-696.1) ECZEMA (ICD-692.9) OSTEOPOROSIS (ICD-733.00) FAMILY HISTORY OF COLON CA 1ST DEGREE RELATIVE <60 (ICD-V16.0) BIPOLAR Osteoarthritis-- LOW BACK  Past Surgical History: Reviewed history from 12/21/2007 and no changes required. Appendectomy Hysterectomy,  partial Tonsillectomy ventral hernia Lumpectomy  Family History: Reviewed history from 12/21/2007 and no changes required. MOTHER:LIVING FATHER:LIVING 4:BROTHERS-1LIVING Family History of Colon CA 1st degree relative <60: BROTHER(DECEASED) Family History Lung cancer: BROTHER(DECEASED), FATHER Family History of Prostate CA 1st degree relative <50: FATHER HEART:BROTHER(DECEASED) Family History of Arthritis:FATHER Family History of Aneurysm Aortic-- brother (deceased)  Social History: Reviewed history from 12/21/2007 and no changes required. 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 Psych:  Oriented X3 and normally interactive.     Impression & Recommendations:  Problem # 1:  HEMATURIA UNSPECIFIED (ICD-599.70)  Her updated medication list for this problem includes:    Bactrim Ds 800-160 Mg Tabs (Sulfamethoxazole-trimethoprim) .Marland Kitchen... Take 1 by mouth two times a day for 7 days    Bactrim Ds 800-160 Mg Tabs (Sulfamethoxazole-trimethoprim) .Marland Kitchen... 1 by mouth two times a day  Orders: T-Culture, Urine ([id number]) UA Dipstick w/o Micro (manual) (09811)  Discussed medication use and hematuria work-up.   Problem # 2:  DYSURIA (ICD-788.1)  Her updated medication list for this problem includes:    Bactrim Ds 800-160 Mg Tabs (Sulfamethoxazole-trimethoprim) .Marland Kitchen... Take 1 by mouth two times a day for 7 days    Bactrim Ds 800-160 Mg Tabs (Sulfamethoxazole-trimethoprim) .Marland Kitchen... 1 by mouth two times a day  Orders: T-Culture, Urine ([id number]) UA Dipstick w/o Micro (manual) (13086)  Encouraged to push clear liquids, get enough rest, and take acetaminophen as needed. To be seen in 10 days if no improvement, sooner if worse.  Problem # 3:  IBS (ICD-564.1)  align daily if no relief ---refer to GI-- pt was reluctant to go yet she will call if symptoms persist  Orders: UA Dipstick w/o Micro (manual) (57846)  Complete Medication List: 1)  Ambien 10 Mg Tabs (Zolpidem tartrate) .Marland Kitchen.. 1 by mouth at bedtime 2)  Vit-d 1000iu  .Marland Kitchen.. 1 by mouth once daily 3)  Mvi-centrum Silver  .Marland Kitchen.. 1 by mouth once daily 4)  Calcium 600mg   .... 1 by mouth two times a day 5)  Fish Oil 1000 Mg Caps (Omega-3 fatty acids) .Marland Kitchen.. 1 by mouth once daily 6)  Lutein 40 Mg Caps (Lutein) .Marland Kitchen.. 1 by mouth once daily 7)  Synthroid 75 Mcg Tabs (Levothyroxine sodium) .Marland Kitchen.. 1 by mouth once daily 8)  Abilify 2 Mg Tabs (Aripiprazole) .... 2  by mouth once daily 9)  Zocor 40 Mg Tabs (Simvastatin) .... Take one tablet each evening at bedtime. 10)  Zocor 40 Mg Tabs (Simvastatin)  .Marland Kitchen.. 1 by mouth at bedtime 11)  Fosamax 70 Mg Tabs (Alendronate sodium) .... Take 1 tab weekly 12)  Nasonex 50 Mcg/act Susp (Mometasone furoate) .... 2 sprays each nostril once daily 13)  Aciphex 20 Mg Tbec (Rabeprazole sodium) .Marland Kitchen.. 1 by mouth once daily 14)  Promethazine Hcl 25 Mg Tabs (Promethazine hcl) .Marland Kitchen.. 1 by mouth qid as needed 15)  Bactrim Ds 800-160 Mg Tabs (Sulfamethoxazole-trimethoprim) .... Take 1 by mouth two times a day for 7 days 16)  Bactrim Ds 800-160 Mg Tabs (Sulfamethoxazole-trimethoprim) .Marland Kitchen.. 1 by mouth two times a day Prescriptions: BACTRIM DS 800-160 MG TABS (SULFAMETHOXAZOLE-TRIMETHOPRIM) 1 by mouth two times a day  #14 x 0   Entered and Authorized by:   Loreen Freud DO   Signed by:   Loreen Freud DO on 02/23/2010   Method used:   [facility]        [facility] [phone][facility] (retail[facility]       [address]       [place], [place]  [address]       Ph: [phone]       Fax: [phone]   RxID:  [id number]   Laboratory Results   Urine Tests    Routine Urinalysis   Color: yellow Appearance: Clear Glucose: negative   (Normal Range: Negative) Bilirubin: negative   (Normal Range: Negative) Ketone: negative   (Normal Range: Negative) Spec. Gravity: 1.015   (Normal Range: 1.003-1.035) Blood: trace-lysed   (Normal Range: Negative) pH: 7.0   (Normal Range: 5.0-8.0) Protein: negative   (Normal Range: Negative) Urobilinogen: 0.2   (Normal Range: 0-1) Nitrite: negative   (Normal Range: Negative) Leukocyte Esterace: negative   (Normal Range: Negative)    Comments: Army Fossa CMA  Feb 23, 2010 2:55 PM

## 2010-11-17 NOTE — Progress Notes (Signed)
Summary: no better  Phone Note Call from Patient   Summary of Call: PT LEFT MESSAGE ON MACHINE THAT SHE IS STILL NAUSEA AND FEELS THAT SHE HAS A FEVER. PT STATES THAT SHE HAS ALREADY TAKING SEVERAL ANTIBIOTIC AND HAS NOT HAD ANY RELIEF YET...............................Marland KitchenFelecia Deloach CMA  May 30, 2009 3:27 PM  Follow-up for Phone Call        is she still having urinary symptoms?  if yes--refer to urology---if only nausea---ov Follow-up by: Loreen Freud DO,  May 30, 2009 3:38 PM  Additional Follow-up for Phone Call Additional follow up Details #1::        pt aware....................Marland KitchenFelecia Deloach CMA  May 30, 2009 4:02 PM

## 2010-11-17 NOTE — Assessment & Plan Note (Signed)
Summary: acute/uti   Vital Signs:  Patient Profile:   77 Years Old Female Height:     66 inches Weight:      138 pounds Temp:     98.4 degrees F oral Pulse rate:   78 / minute Resp:     14 per minute BP sitting:   124 / 80  (left arm)  Pt. in pain?   no  Vitals Entered By: Ardyth Man (April 23, 2008 3:23 PM)                  PCP:  Laury Axon  Chief Complaint:  ?UTI and Dysuria.  History of Present Illness:  Dysuria      This is a 77 year old woman who presents with Dysuria.  The symptoms began duration > 3 days ago.  The patient reports burning with urination, urinary frequency, and urgency, but denies hematuria, vaginal discharge, vaginal itching, vaginal sores, and penile discharge.  Associated symptoms include flank pain.  The patient denies the following associated symptoms: nausea, vomiting, fever, shaking chills, abdominal pain, back pain, pelvic pain, and arthralgias.  The patient denies the following risk factors: diabetes, prior antibiotics, immunosuppression, history of GU anomaly, history of pyelonephritis, pregnancy, history of STD, and analgesic abuse.  History is significant for no urinary tract problems.      Current Allergies: ! [facility]  Past Medical History:    Reviewed history from 12/21/2007 and no changes required:       HYPOTHYROIDISM (ICD-244.9)       INSOMNIA (ICD-780.52)       PSORIASIS (ICD-696.1)       ECZEMA (ICD-692.9)       OSTEOPOROSIS (ICD-733.00)       FAMILY HISTORY OF COLON CA 1ST DEGREE RELATIVE <60 (ICD-V16.0)       BIPOLAR       Osteoarthritis-- LOW BACK  Past Surgical History:    Reviewed history from 12/21/2007 and no changes required:       Appendectomy       Hysterectomy,  partial       Tonsillectomy       ventral hernia       Lumpectomy   Family History:    Reviewed history from 12/21/2007 and no changes required:       MOTHER:LIVING       FATHER:LIVING       4:BROTHERS-1LIVING       Family History of Colon CA  1st degree relative <60: BROTHER(DECEASED)       Family History Lung cancer: BROTHER(DECEASED), FATHER       Family History of Prostate CA 1st degree relative <50: FATHER       HEART:BROTHER(DECEASED)       Family History of Arthritis:FATHER       Family History of Aneurysm Aortic-- brother (deceased)  Social History:    Reviewed history from 12/21/2007 and no changes required:       Retired-- Engineer, 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[place]       Single       Former Smoker       Alcohol use-no       Drug use-no       Regular exercise-yes   Risk Factors: Tobacco use:  quit    Year quit:  age 56    Pack-years:  20-- 1ppd for 20 years Passive smoke exposure:  no Drug use:  no HIV high-risk behavior:  no Alcohol use:  no Exercise:  yes  Times per week:  7    Type:  WALKING,YOGA Seatbelt use:  100 %  Family History Risk Factors:    Family History of MI in females < 71 years old:  no    Family History of MI in males < 56 years old:  no  Colonoscopy History:    Date of Last Colonoscopy:  01/31/2008  Mammogram History:    Date of Last Mammogram:  11/02/2006   Review of Systems      See HPI   Physical Exam  General:     Well-developed,well-nourished,in no acute distress; alert,appropriate and cooperative throughout examination Abdomen:     + suprapubic tenderness soft, normal bowel sounds, no distention, no masses, no guarding, no rigidity, and no rebound tenderness.   no flank tenderness Skin:     Intact without suspicious lesions or rashes    Impression & Recommendations:  Problem # 1:  UTI (ICD-599.0)  Her updated medication list for this problem includes:    Ciprofloxacin Hcl 500 Mg Tabs (Ciprofloxacin hcl) .Marland Kitchen... 1 by mouth two times a day  Orders: T-Culture, Urine ([id number])  Encouraged to push clear liquids, get enough rest, and take acetaminophen as needed. To be seen in 10 days if no improvement, sooner if worse.   Complete Medication List: 1)  Ambien 5 Mg Tabs  (Zolpidem tartrate) .Marland Kitchen.. 1 by mouth at bedtime 2)  Forteo 750 Mcg/60ml Soln (Teriparatide (recombinant)) .... subcutaneous daily 3)  Vit-d 1000iu  .Marland Kitchen.. 1 by mouth once daily 4)  Mvi-centrum Silver  .Marland Kitchen.. 1 by mouth once daily 5)  Calcium 600mg   .... 1 by mouth two times a day 6)  Fish Oil 1000 Mg Caps (Omega-3 fatty acids) .Marland Kitchen.. 1 by mouth once daily 7)  Lutein 40 Mg Caps (Lutein) .Marland Kitchen.. 1 by mouth once daily 8)  Bd Mini Pen Ndl 31 G X18mm  .... As directed 9)  Alcohol Prep Pads  .... As directed 10)  Synthroid 75 Mcg Tabs (Levothyroxine sodium) .Marland Kitchen.. 1 by mouth once daily 11)  Abilify 2 Mg Tabs (Aripiprazole) .Marland Kitchen.. 1 by mouth once daily 12)  Zocor 40 Mg Tabs (Simvastatin) .... Take one tablet each evening at bedtime. 13)  Ciprofloxacin Hcl 500 Mg Tabs (Ciprofloxacin hcl) .Marland Kitchen.. 1 by mouth two times a day 14)  Zocor 40 Mg Tabs (Simvastatin) .Marland Kitchen.. 1 by mouth at bedtime    Prescriptions: ZOCOR 40 MG  TABS (SIMVASTATIN) 1 by mouth at bedtime  #90 x 3   Entered and Authorized by:   Loreen Freud DO   Signed by:   Loreen Freud DO on 04/23/2008   Method used:   Print then Give to Patient   RxID:   [id number] CIPROFLOXACIN HCL 500 MG  TABS (CIPROFLOXACIN HCL) 1 by mouth two times a day  #10 x 0   Entered and Authorized by:   Loreen Freud DO   Signed by:   Loreen Freud DO on 04/23/2008   Method used:   Electronically sent to ...       [facility] [phone][facility]       [address]       [place], [place]  [address]       Ph: [phone]       Fax: [phone]   RxID:   [id number]  ] Laboratory Results   Urine Tests  Date/Time Recieved: Ardyth Man  April 23, 2008 3:28 PM  Date/Time Reported: [name] [name]  [name] [name]  April 23, 2008 3:28 PM   Routine Urinalysis   Color: straw Appearance: Hazy Glucose: negative   (Normal Range: Negative) Bilirubin: negative   (Normal Range: Negative) Ketone: negative   (Normal Range: Negative) Spec. Gravity: <1.005    (Normal Range: 1.003-1.035) Blood: moderate   (Normal Range: Negative) pH: 5.5   (Normal Range: 5.0-8.0) Protein: 30   (Normal Range: Negative) Urobilinogen: negative   (Normal Range: 0-1) Nitrite: positive   (Normal Range: Negative) Leukocyte Esterace: moderate   (Normal Range: Negative)

## 2010-11-17 NOTE — Progress Notes (Signed)
Summary: ?FORTEO &REFERAL - DR Laury Axon  Phone Note Call from Patient Call back at [facility] Phone [phone]   Caller: Patient Summary of Call: PATIENT FEELS SHE HAS BEEN ON FORTEO TWO YEARS - SHE IS TO BE TAKEING SHE NEEDS REFERRAL FOR BONE DENSITY- & INSTRUCTION  IF YOU FEEL SHE SHOULD STOP TAKING FORTEO Initial call taken by: Okey Regal [place],  September 09, 2008 10:55 AM  Follow-up for Phone Call        Dr. Laury Axon, Please advise. Felecia Deloach CMA  September 09, 2008 1:53 PM  Follow-up by: Jeremy Johann CMA,  September 09, 2008 1:53 PM  Additional Follow-up for Phone Call Additional follow up Details #1::        get bmd first then we will discuss options Additional Follow-up by: Loreen Freud DO,  September 09, 2008 3:52 PM    Additional Follow-up for Phone Call Additional follow up Details #2::    Patient aware, would like to use Yolanda Bonine. Ardyth Man  September 10, 2008 5:47 PM  Follow-up by: Ardyth Man,  September 10, 2008 5:47 PM

## 2010-11-17 NOTE — Letter (Signed)
Summary: [facility]  [facility]   Imported By: Lanelle Bal 08/01/2009 11:23:08  _____________________________________________________________________  External Attachment:    Type:   Image     Comment:   External Document

## 2010-11-17 NOTE — Progress Notes (Signed)
  Phone Note Call from Patient Call back at [facility] Phone [phone]   Caller: Patient Summary of Call: Patient calling and wanted to know where she had her Bone Density done. The [facility] center [facility] per chart. Patient aware Ardyth Man  August 05, 2008 11:38 AM  Initial call taken by: Ardyth Man,  August 05, 2008 11:38 AM

## 2010-11-17 NOTE — Progress Notes (Signed)
Summary: WANTS MORE ANTIBIOTICS  Phone Note Call from Patient Call back at [facility] Phone [phone]   Caller: Patient Call For: Loreen Freud DO Reason for Call: Talk to Nurse Summary of Call: PATIENT CALLING TO SEE IF DR. Laury Axon IS CALLING HER IN MORE ANTIBIOTICS FOR HER CONSTANT UTI.  HER APPT W/[facility] IS 06-18-09 & PT IS AWARE OF THAT. Initial call taken by: Magdalen Spatz [facility],  May 26, 2009 5:31 PM  Follow-up for Phone Call        done Follow-up by: Loreen Freud DO,  May 27, 2009 11:06 AM  Additional Follow-up for Phone Call Additional follow up Details #1::        pt aware rx sent to pharmacy..............Marland KitchenFelecia Deloach CMA  May 27, 2009 12:13 PM    New/Updated Medications: NITROFURANTOIN MACROCRYSTAL 50 MG CAPS (NITROFURANTOIN MACROCRYSTAL) 1 by mouth at bedtime Prescriptions: NITROFURANTOIN MACROCRYSTAL 50 MG CAPS (NITROFURANTOIN MACROCRYSTAL) 1 by mouth at bedtime  #30 x 0   Entered and Authorized by:   Loreen Freud DO   Signed by:   Loreen Freud DO on 05/27/2009   Method used:   Electronically to        [facility] [phone]* (retail)       [address]       [place], [place]  [address]       Ph: [phone]       Fax: [phone]   RxID:   [id number]

## 2010-11-17 NOTE — Progress Notes (Signed)
  Phone Note Refill Request   Refills Requested: Medication #1:  MACROBID 100 MG CAPS Take one tablet twice daily for 7 days./. pt states that she is still nausea, and having buring with urination and would like a refill on med.Marti Sleigh Deloach CMA  May 19, 2009 4:49 PM   Follow-up for Phone Call        last ov 05-09-09, last filled 05-12-09 #14.Marti Sleigh Deloach CMA  May 19, 2009 4:49 PM  Additional Follow-up for Phone Call Additional follow up Details #1::        refill x1---pt needs ov to recheck urine  Additional Follow-up by: Loreen Freud DO,  May 19, 2009 5:03 PM    Additional Follow-up for Phone Call Additional follow up Details #2::    pt aware rx sent to pharmacy....Marland KitchenMarland KitchenFelecia Deloach CMA  May 19, 2009 5:08 PM  Prescriptions: MACROBID 100 MG CAPS (NITROFURANTOIN MONOHYD MACRO) Take one tablet twice daily for 7 days./  #14 x 0   Entered by:   Jeremy Johann CMA   Authorized by:   Loreen Freud DO   Signed by:   Jeremy Johann CMA on 05/19/2009   Method used:   Faxed to ...       [facility] [phone]* (retail)       [address]       [place], [place]  [address]       Ph: [phone]       Fax: [phone]   RxID:   [id number]

## 2010-11-17 NOTE — Assessment & Plan Note (Signed)
Summary: nausea//fd   Vital Signs:  Patient profile:   77 year old female Height:      66 inches Weight:      147 pounds BMI:     23.81 Temp:     98.3 degrees F oral Pulse rate:   90 / minute BP sitting:   140 / 80  (left arm)  Vitals Entered By: Jeremy Johann CMA (June 02, 2009 2:08 PM) CC: nausea,weakness,tremorous   History of Present Illness: Pt here c/o nausea since finishing the cipro.  + urinary frequency but no other symptoms.   no diarrhea , no vomiting,  no fever.     Current Medications (verified): 1)  Ambien 5 Mg  Tabs (Zolpidem Tartrate) .Marland Kitchen.. 1 By Mouth At Bedtime 2)  Vit-D 1000iu .Marland Kitchen.. 1 By Mouth Once Daily 3)  Mvi-Centrum Silver .Marland Kitchen.. 1 By Mouth Once Daily 4)  Calcium 600mg  .... 1 By Mouth Two Times A Day 5)  Fish Oil 1000 Mg  Caps (Omega-3 Fatty Acids) .Marland Kitchen.. 1 By Mouth Once Daily 6)  Lutein 40 Mg  Caps (Lutein) .Marland Kitchen.. 1 By Mouth Once Daily 7)  Synthroid 75 Mcg  Tabs (Levothyroxine Sodium) .Marland Kitchen.. 1 By Mouth Once Daily 8)  Abilify 2 Mg  Tabs (Aripiprazole) .Marland Kitchen.. 1 By Mouth Once Daily 9)  Zocor 40 Mg  Tabs (Simvastatin) .... Take One Tablet Each Evening At Bedtime. 10)  Zocor 40 Mg  Tabs (Simvastatin) .Marland Kitchen.. 1 By Mouth At Bedtime 11)  Fosamax 70 Mg Tabs (Alendronate Sodium) .... Take 1 Tab Weekly 12)  Nasonex 50 Mcg/act Susp (Mometasone Furoate) .... 2 Sprays Each Nostril Once Daily 13)  Aciphex 20 Mg Tbec (Rabeprazole Sodium) .Marland Kitchen.. 1 By Mouth Once Daily 14)  Promethazine Hcl 25 Mg Tabs (Promethazine Hcl) .Marland Kitchen.. 1 By Mouth Qid As Needed  Allergies (verified): 1)  ! Wellbutrin  Past History:  Past medical, surgical, family and social histories (including risk factors) reviewed, and no changes noted (except as noted below).  Past Medical History: Reviewed history from 12/21/2007 and no changes required. HYPOTHYROIDISM (ICD-244.9) INSOMNIA (ICD-780.52) PSORIASIS (ICD-696.1) ECZEMA (ICD-692.9) OSTEOPOROSIS (ICD-733.00) FAMILY HISTORY OF COLON CA 1ST DEGREE RELATIVE  <60 (ICD-V16.0) BIPOLAR Osteoarthritis-- LOW BACK  Past Surgical History: Reviewed history from 12/21/2007 and no changes required. Appendectomy Hysterectomy,  partial Tonsillectomy ventral hernia Lumpectomy  Family History: Reviewed history from 12/21/2007 and no changes required. MOTHER:LIVING FATHER:LIVING 4:BROTHERS-1LIVING Family History of Colon CA 1st degree relative <60: BROTHER(DECEASED) Family History Lung cancer: BROTHER(DECEASED), FATHER Family History of Prostate CA 1st degree relative <50: FATHER HEART:BROTHER(DECEASED) Family History of Arthritis:FATHER Family History of Aneurysm Aortic-- brother (deceased)  Social History: Reviewed history from 12/21/2007 and no changes required. 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  Teeth in good repair. Neck:  No deformities, masses, or tenderness noted. Lungs:  Normal respiratory effort, chest expands symmetrically. Lungs are clear to auscultation, no crackles or wheezes. Heart:  Normal rate and regular rhythm. S1 and S2 normal without gallop, murmur, click, rub or other extra sounds. Abdomen:  Bowel sounds positive,abdomen soft and non-tender without masses, organomegaly or hernias noted. Extremities:  No clubbing, cyanosis, edema, or deformity noted with normal full range of motion of all joints.   Neurologic:  No cranial nerve  deficits noted. Station and gait are normal. Plantar reflexes are down-going bilaterally. DTRs are symmetrical throughout. Sensory, motor and coordinative functions appear intact. Skin:  Intact without suspicious lesions or rashes Cervical Nodes:  No lymphadenopathy noted Psych:  Cognition and judgment  appear intact. Alert and cooperative with normal attention span and concentration. No apparent delusions, illusions, hallucinations   Impression & Recommendations:  Problem # 1:  ABDOMINAL PAIN OTHER SPECIFIED SITE (ICD-789.09)  Orders: Venipuncture (29562) TLB-BMP (Basic Metabolic Panel-BMET) (80048-METABOL) TLB-CBC Platelet - w/Differential (85025-CBCD) TLB-Hepatic/Liver Function Pnl (80076-HEPATIC) TLB-TSH (Thyroid Stimulating Hormone) (84443-TSH) Venipuncture (13086) Radiology Referral (Radiology) [place]   Discussed use of medications, application of heat or cold, and exercises.   Problem # 2:  NAUSEA (ICD-787.02)  Orders: Venipuncture (57846) TLB-BMP (Basic Metabolic Panel-BMET) (80048-METABOL) TLB-CBC Platelet - w/Differential (85025-CBCD) TLB-Hepatic/Liver Function Pnl (80076-HEPATIC) TLB-TSH (Thyroid Stimulating Hormone) (84443-TSH) Venipuncture (96295) Radiology Referral (Radiology) Admin of Therapeutic Inj  intramuscular or subcutaneous (28413) Promethazine up to 50mg  (K4401)  Her updated medication list for this problem includes:    Promethazine Hcl 25 Mg Tabs (Promethazine hcl) .Marland Kitchen... 1 by mouth qid as needed  Discussed symptom control.   Problem # 3:  URINARY INCONTINENCE, STRESS, FEMALE (ICD-625.6) pt has urology appointment this week  Complete Medication List: 1)  Ambien 5 Mg Tabs (Zolpidem tartrate) .Marland Kitchen.. 1 by mouth at bedtime 2)  Vit-d 1000iu  .Marland Kitchen.. 1 by mouth once daily 3)  Mvi-centrum Silver  .Marland Kitchen.. 1 by mouth once daily 4)  Calcium 600mg   .... 1 by mouth two times a day 5)  Fish Oil 1000 Mg Caps (Omega-3 fatty acids) .Marland Kitchen.. 1 by mouth once daily 6)  Lutein 40 Mg Caps (Lutein) .Marland Kitchen.. 1 by mouth once daily 7)  Synthroid 75 Mcg Tabs (Levothyroxine sodium) .Marland Kitchen.. 1 by mouth once daily 8)  Abilify 2 Mg Tabs (Aripiprazole) .Marland Kitchen.. 1 by mouth once daily 9)  Zocor 40 Mg Tabs (Simvastatin) .... Take one tablet each evening at bedtime. 10)  Zocor 40 Mg Tabs (Simvastatin) .Marland Kitchen..  1 by mouth at bedtime 11)  Fosamax 70 Mg Tabs (Alendronate sodium) .... Take 1 tab weekly 12)  Nasonex 50 Mcg/act Susp (Mometasone furoate) .... 2 sprays each nostril once daily 13)  Aciphex 20 Mg Tbec (Rabeprazole sodium) .Marland Kitchen.. 1 by mouth once daily 14)  Promethazine Hcl 25 Mg Tabs (Promethazine hcl) .Marland Kitchen.. 1 by mouth qid as needed Prescriptions: PROMETHAZINE HCL 25 MG TABS (PROMETHAZINE HCL) 1 by mouth qid as needed  #20 x 0   Entered and Authorized by:   Loreen Freud DO   Signed by:   Loreen Freud DO on 06/02/2009   Method used:   [facility] to        [phone]ail[facility]       [address]       [place], [place]  [address]       Ph: [phone]       Fax: [phone]   RxID:   [id number]    Medication Administration  Injection # 1:    Medication: Promethazine up to 50mg     Diagnosis: NAUSEA (ICD-787.02)    Route: IM    Site: RUOQ gluteus    Exp Date: 09/17/2010    Lot #: [id number]    Mfr: [email or web address]    Comments: pt given 50 mg    Patient tolerated injection without complications    Given by: Jeremy Johann CMA (June 02, 2009 5:18 PM)  Orders Added: 1)  Venipuncture [36415] 2)  TLB-BMP (Basic Metabolic Panel-BMET) [80048-METABOL] 3)  TLB-CBC Platelet - w/Differential [85025-CBCD] 4)  TLB-Hepatic/Liver Function Pnl [80076-HEPATIC] 5)  TLB-TSH (Thyroid Stimulating Hormone) [84443-TSH] 6)  Venipuncture [44010] 7)  Radiology Referral [Radiology] 8)  Admin of Therapeutic Inj  intramuscular or subcutaneous [96372] 9)  Promethazine up to 50mg  [J2550] 10)  Est. Patient Level IV [27253]

## 2010-11-17 NOTE — Progress Notes (Signed)
Summary: [facility] Report Results  Phone Note Outgoing Call Call back at [facility] Phone [phone]   Call placed by: Shonna Chock,  June 05, 2009 1:53 PM Call placed to: Patient Summary of Call: Tried to call patient x 2, line busy.  how is pt feeling?  No acute changes on CT----  + complex cyst R kidney----radiologist recc.  CT abd in 6 months to evaluate further---- however if pt feeling no better may need to to ct now.    Chrae Malloy  June 05, 2009 1:57 PM   Follow-up for Phone Call        pt states that nausea is almost gone and the shot really help and she is doing much better now.Marti Sleigh Deloach CMA  June 05, 2009 4:40 PM

## 2010-11-17 NOTE — Assessment & Plan Note (Signed)
Summary: cpx//tl   Vital Signs:  Patient Profile:   77 Years Old Female Height:     66 inches Weight:      138.2 pounds Temp:     98.1 degrees F oral Pulse rate:   88 / minute Resp:     16 per minute BP sitting:   122 / 76  (left arm) Cuff size:   regular  Vitals Entered By: Shonna Chock (December 21, 2007 9:05 AM)                 PCP:  Laury Axon  Chief Complaint:  CPX WITH FASTING LABS and NO PAP.Marland Kitchen  History of Present Illness: Pt here for CPE and labs -- no pap.    Current Allergies: ! [facility]  Past Medical History:    Reviewed history from 09/23/2007 and no changes required:       HYPOTHYROIDISM (ICD-244.9)       INSOMNIA (ICD-780.52)       PSORIASIS (ICD-696.1)       ECZEMA (ICD-692.9)       OSTEOPOROSIS (ICD-733.00)       FAMILY HISTORY OF COLON CA 1ST DEGREE RELATIVE <60 (ICD-V16.0)       BIPOLAR       Osteoarthritis-- LOW BACK  Past Surgical History:    Reviewed history from 09/20/2007 and no changes required:       Appendectomy       Hysterectomy,  partial       Tonsillectomy       ventral hernia       Lumpectomy   Family History:    Reviewed history from 09/01/2007 and no changes required:       MOTHER:LIVING       FATHER:LIVING       4:BROTHERS-1LIVING       Family History of Colon CA 1st degree relative <60: BROTHER(DECEASED)       Family History Lung cancer: BROTHER(DECEASED), FATHER       Family History of Prostate CA 1st degree relative <50: FATHER       HEART:BROTHER(DECEASED)       Family History of Arthritis:FATHER       Family History of Aneurysm Aortic-- brother (deceased)  Social History:    Reviewed history and no changes required:       Retired-- Engineer, 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[place]       Single       Former Smoker       Alcohol use-no       Drug use-no       Regular exercise-yes   Risk Factors:  Tobacco use:  quit    Year quit:  age 65    Pack-years:  20-- 1ppd for 20 years Passive smoke exposure:  no Drug use:  no HIV high-risk  behavior:  no Alcohol use:  no Exercise:  yes Seatbelt use:  100 %  Family History Risk Factors:    Family History of MI in females < 49 years old:  no    Family History of MI in males < 27 years old:  no  Mammogram History:     Date of Last Mammogram:  11/02/2006    Results:  Normal Bilateral   Colonoscopy History:     Date of Last Colonoscopy:  11/01/2001    Results:  polyps    Review of Systems      See HPI  General      Denies chills, fatigue, fever, loss of appetite,  malaise, sleep disorder, sweats, weakness, and weight loss.  Eyes      Denies blurring, discharge, double vision, eye irritation, eye pain, halos, itching, light sensitivity, red eye, vision loss-1 eye, and vision loss-both eyes.      optho-q 1 year-- beg of cataract per pt per ophtho  ENT      Denies decreased hearing, difficulty swallowing, ear discharge, earache, hoarseness, nasal congestion, nosebleeds, postnasal drainage, ringing in ears, sinus pressure, and sore throat.      dentist q67m  CV      Denies bluish discoloration of lips or nails, chest pain or discomfort, difficulty breathing at night, difficulty breathing while lying down, fainting, fatigue, leg cramps with exertion, lightheadness, near fainting, palpitations, shortness of breath with exertion, swelling of feet, swelling of hands, and weight gain.  Resp      Denies chest discomfort, chest pain with inspiration, cough, coughing up blood, excessive snoring, hypersomnolence, morning headaches, pleuritic, shortness of breath, sputum productive, and wheezing.  GI      Denies abdominal pain, bloody stools, change in bowel habits, constipation, dark tarry stools, diarrhea, excessive appetite, gas, hemorrhoids, indigestion, loss of appetite, nausea, vomiting, vomiting blood, and yellowish skin color.  GU      Denies abnormal vaginal bleeding, decreased libido, discharge, dysuria, genital sores, hematuria, incontinence, nocturia, urinary frequency,  and urinary hesitancy.  MS      Denies joint pain, joint redness, joint swelling, loss of strength, low back pain, mid back pain, muscle aches, muscle , cramps, muscle weakness, stiffness, and thoracic pain.  Derm      derm- Dr Lucretia Roers  Neuro      Denies brief paralysis, difficulty with concentration, disturbances in coordination, falling down, headaches, inability to speak, memory loss, numbness, poor balance, seizures, sensation of room spinning, tingling, tremors, visual disturbances, and weakness.  Psych      Denies alternate hallucination ( auditory/visual), anxiety, depression, easily angered, easily tearful, irritability, mental problems, panic attacks, sense of great danger, suicidal thoughts/plans, thoughts of violence, unusual visions or sounds, and thoughts /plans of harming others.      psych q16m  Endo      Denies cold intolerance, excessive hunger, excessive thirst, excessive urination, heat intolerance, polyuria, and weight change.  Heme      Denies abnormal bruising, bleeding, enlarge lymph nodes, fevers, pallor, and skin discoloration.  Allergy      Denies hives or rash, itching eyes, persistent infections, seasonal allergies, and sneezing.  ENT      Denies decreased hearing, difficulty swallowing, ear discharge, earache, hoarseness, nasal congestion, nosebleeds, postnasal drainage, ringing in ears, sinus pressure, and sore throat.   Physical Exam  General:     Well-developed,well-nourished,in no acute distress; alert,appropriate and cooperative throughout examination Head:     Normocephalic and atraumatic without obvious abnormalities. No apparent alopecia or balding. Eyes:     vision grossly intact, pupils equal, pupils round, pupils reactive to light, and no injection.   Ears:     External ear exam shows no significant lesions or deformities.  Otoscopic examination reveals clear canals, tympanic membranes are intact bilaterally without bulging, retraction,  inflammation or discharge. Hearing is grossly normal bilaterally. Nose:     External nasal examination shows no deformity or inflammation. Nasal mucosa are pink and moist without lesions or exudates. Mouth:     Oral mucosa and oropharynx without lesions or exudates.  Teeth in good repair. Neck:     No deformities, masses, or tenderness noted.no carotid bruits  and no cervical lymphadenopathy.   Chest Wall:     No deformities, masses, or tenderness noted. Breasts:     No mass, nodules, thickening, tenderness, bulging, retraction, inflamation, nipple discharge or skin changes noted.   Lungs:     Normal respiratory effort, chest expands symmetrically. Lungs are clear to auscultation, no crackles or wheezes. Heart:     normal rate, regular rhythm, and no murmur.   Abdomen:     Bowel sounds positive,abdomen soft and non-tender without masses, organomegaly or hernias noted. Rectal:     gyn Genitalia:     gyn Msk:     normal ROM, no joint tenderness, no joint swelling, no joint warmth, no redness over joints, no joint deformities, no joint instability, no crepitation, and no muscle atrophy.   Pulses:     R carotid normal and L carotid normal.   Extremities:     No clubbing, cyanosis, edema, or deformity noted with normal full range of motion of all joints.   Neurologic:     No cranial nerve deficits noted. Station and gait are normal. Plantar reflexes are down-going bilaterally. DTRs are symmetrical throughout. Sensory, motor and coordinative functions appear intact. Skin:     Intact without suspicious lesions or rashes Cervical Nodes:     No lymphadenopathy noted Psych:     Cognition and judgment appear intact. Alert and cooperative with normal attention span and concentration. No apparent delusions, illusions, hallucinations    Impression & Recommendations:  Problem # 1:  PREVENTIVE HEALTH CARE (ICD-V70.0) Immun UTD Orders: Venipuncture (60454) TLB-B12 + Folate Pnl  (09811_91478-G95/AOZ) TLB-Lipid Panel (80061-LIPID) TLB-BMP (Basic Metabolic Panel-BMET) (80048-METABOL) TLB-CBC Platelet - w/Differential (85025-CBCD) TLB-Hepatic/Liver Function Pnl (80076-HEPATIC) TLB-TSH (Thyroid Stimulating Hormone) (84443-TSH) T-Vitamin D (25-Hydroxy) (30865-78469) UA Dipstick w/o Micro (manual) (62952) EKG w/ Interpretation (93000)   Problem # 2:  BIPOLAR DISORDER UNSPECIFIED (ICD-296.80) per psych Orders: Venipuncture (84132) TLB-B12 + Folate Pnl (44010_27253-G64/QIH) TLB-Lipid Panel (80061-LIPID) TLB-BMP (Basic Metabolic Panel-BMET) (80048-METABOL) TLB-CBC Platelet - w/Differential (85025-CBCD) TLB-Hepatic/Liver Function Pnl (80076-HEPATIC) TLB-TSH (Thyroid Stimulating Hormone) (84443-TSH) T-Vitamin D (25-Hydroxy) (47425-95638) UA Dipstick w/o Micro (manual) (75643) EKG w/ Interpretation (93000)   Problem # 3:  COLONIC POLYPS (ICD-211.3)  Orders: Gastroenterology Referral (GI) UA Dipstick w/o Micro (manual) (32951) EKG w/ Interpretation (93000)   Problem # 4:  HYPOTHYROIDISM (ICD-244.9)  Her updated medication list for this problem includes:    Synthroid 75 Mcg Tabs (Levothyroxine sodium) .Marland Kitchen... 1 by mouth once daily  Orders: Venipuncture (88416) TLB-B12 + Folate Pnl (60630_16010-X32/TFT) TLB-Lipid Panel (80061-LIPID) TLB-BMP (Basic Metabolic Panel-BMET) (80048-METABOL) TLB-CBC Platelet - w/Differential (85025-CBCD) TLB-Hepatic/Liver Function Pnl (80076-HEPATIC) TLB-TSH (Thyroid Stimulating Hormone) (84443-TSH) T-Vitamin D (25-Hydroxy) (73220-25427) UA Dipstick w/o Micro (manual) (06237) EKG w/ Interpretation (93000)  Labs Reviewed: TSH: 2.69 (09/06/2006)    Chol: 185 (09/06/2006)   HDL: 50.2 (09/06/2006)   LDL: 119 (09/06/2006)   TG: 80 (09/06/2006)   Problem # 5:  OSTEOPOROSIS (ICD-733.00)  Her updated medication list for this problem includes:    Forteo 750 Mcg/33ml Soln (Teriparatide (recombinant)) .Marland Kitchen... subcutaneous  daily  Orders: Venipuncture (62831) TLB-B12 + Folate Pnl (51761_60737-T06/YIR) TLB-Lipid Panel (80061-LIPID) TLB-BMP (Basic Metabolic Panel-BMET) (80048-METABOL) TLB-CBC Platelet - w/Differential (85025-CBCD) TLB-Hepatic/Liver Function Pnl (80076-HEPATIC) TLB-TSH (Thyroid Stimulating Hormone) (84443-TSH) T-Vitamin D (25-Hydroxy) (48546-27035) UA Dipstick w/o Micro (manual) (00938) EKG w/ Interpretation (93000)   Problem # 6:  PSORIASIS (ICD-696.1) per derm Orders: Venipuncture (18299) TLB-B12 + Folate Pnl (37169_67893-Y10/FBP) TLB-Lipid Panel (80061-LIPID) TLB-BMP (Basic Metabolic Panel-BMET) (80048-METABOL) TLB-CBC Platelet - w/Differential (  85025-CBCD) TLB-Hepatic/Liver Function Pnl (80076-HEPATIC) TLB-TSH (Thyroid Stimulating Hormone) (84443-TSH) T-Vitamin D (25-Hydroxy) (09811-91478) UA Dipstick w/o Micro (manual) (29562) EKG w/ Interpretation (93000)   Complete Medication List: 1)  Ambien 5 Mg Tabs (Zolpidem tartrate) .Marland Kitchen.. 1 by mouth at bedtime 2)  Forteo 750 Mcg/50ml Soln (Teriparatide (recombinant)) .... subcutaneous daily 3)  Vit-d 1000iu  .Marland Kitchen.. 1 by mouth once daily 4)  Mvi-centrum Silver  .Marland Kitchen.. 1 by mouth once daily 5)  Calcium 600mg   .... 1 by mouth two times a day 6)  Fish Oil 1000 Mg Caps (Omega-3 fatty acids) .Marland Kitchen.. 1 by mouth once daily 7)  Lutein 40 Mg Caps (Lutein) .Marland Kitchen.. 1 by mouth once daily 8)  Bd Mini Pen Ndl 31 G X82mm  .... As directed 9)  Alcohol Prep Pads  .... As directed 10)  Synthroid 75 Mcg Tabs (Levothyroxine sodium) .Marland Kitchen.. 1 by mouth once daily 11)  Abilify 2 Mg Tabs (Aripiprazole) .Marland Kitchen.. 1 by mouth once daily     ]  Tetanus/Td Immunization History:    Tetanus/Td # 1:  Td (10/18/1998)  Pneumovax Immunization History:    Pneumovax # 1:  Pneumovax (Medicare) (08/08/2002)  Other Immunization History:    Zostavax # 1:  Zostavax (09/28/2006)    Echocardiogram  Procedure date:  12/21/2007  Findings:      sinus rhythm 93 bpm

## 2010-11-17 NOTE — Progress Notes (Signed)
  Phone Note Outgoing Call Call back at [facility] Phone [phone]   Call placed by: Ardyth Man,  May 12, 2009 3:08 PM Call placed to: Patient Summary of Call: Patient aware of uti and macrobid sent to pharmacy Ardyth Man  May 12, 2009 3:08 PM

## 2010-11-17 NOTE — Progress Notes (Signed)
Summary: lmtc 1-21  Phone Note Outgoing Call   Call placed by: Jeremy Johann CMA,  November 07, 2009 10:13 AM Summary of Call: left message to call  office..................Marland KitchenFelecia Deloach CMA  November 07, 2009 10:13 AM   + UTI---resistant to cipro-----bactrim ds 1 by mouth two times a day for 7 days  recheck 2 weeks  Follow-up for Phone Call        Pt is aware to stop cipro and pick up bactrim. Army Fossa CMA  November 07, 2009 2:22 PM     New/Updated Medications: BACTRIM DS 800-160 MG TABS (SULFAMETHOXAZOLE-TRIMETHOPRIM) take 1 by mouth two times a day for 7 days Prescriptions: BACTRIM DS 800-160 MG TABS (SULFAMETHOXAZOLE-TRIMETHOPRIM) take 1 by mouth two times a day for 7 days  #14 x 0   Entered by:   Army Fossa CMA   Authorized by:   Loreen Freud DO   Signed by:   Army Fossa CMA on 11/07/2009   Method used:   [facility]        [phone][facility] ([facility]ail[facility]       [address]       [place], [place]  [address]       Ph: [phone]       Fax: [phone]   RxID:   [id number]

## 2010-11-17 NOTE — Assessment & Plan Note (Signed)
Summary: frequency urination/alr  ns/alr   Vital Signs:  Patient profile:   77 year old female Height:      66 inches Weight:      148 pounds Temp:     98.3 degrees F oral Pulse rate:   86 / minute BP sitting:   160 / 90  (left arm)  Vitals Entered By: Jeremy Johann CMA (May 09, 2009 2:04 PM) CC: constant urge to urinate   History of Present Illness: Pt here c/o urinary incontinence ----+ frequent urination ---up several times at night.  She is going on a cruise with a friend and is afraid it will be a problem.  Her friend takes enablex and she would like to try this.   No dysuria,  back pain or blood in urine.   Current Medications (verified): 1)  Ambien 5 Mg  Tabs (Zolpidem Tartrate) .Marland Kitchen.. 1 By Mouth At Bedtime 2)  Vit-D 1000iu .Marland Kitchen.. 1 By Mouth Once Daily 3)  Mvi-Centrum Silver .Marland Kitchen.. 1 By Mouth Once Daily 4)  Calcium 600mg  .... 1 By Mouth Two Times A Day 5)  Fish Oil 1000 Mg  Caps (Omega-3 Fatty Acids) .Marland Kitchen.. 1 By Mouth Once Daily 6)  Lutein 40 Mg  Caps (Lutein) .Marland Kitchen.. 1 By Mouth Once Daily 7)  Synthroid 75 Mcg  Tabs (Levothyroxine Sodium) .Marland Kitchen.. 1 By Mouth Once Daily 8)  Abilify 2 Mg  Tabs (Aripiprazole) .Marland Kitchen.. 1 By Mouth Once Daily 9)  Zocor 40 Mg  Tabs (Simvastatin) .... Take One Tablet Each Evening At Bedtime. 10)  Zocor 40 Mg  Tabs (Simvastatin) .Marland Kitchen.. 1 By Mouth At Bedtime 11)  Fosamax 70 Mg Tabs (Alendronate Sodium) .... Take 1 Tab Weekly 12)  Nasonex 50 Mcg/act Susp (Mometasone Furoate) .... 2 Sprays Each Nostril Once Daily 13)  Enablex 7.5 Mg Xr24h-Tab (Darifenacin Hydrobromide) .Marland Kitchen.. 1 By Mouth Once Daily  Allergies: 1)  ! Wellbutrin PMH-FH-SH reviewed-no changes except otherwise noted  Physical Exam  General:  Well-developed,well-nourished,in no acute distress; alert,appropriate and cooperative throughout examination Psych:  Oriented X3, normally interactive, and good eye contact.     Impression & Recommendations:  Problem # 1:  URINARY INCONTINENCE, STRESS, FEMALE  (ICD-625.6) enablex 7.5 1 by mouth once daily ----after 2 weeks can increase to 15 mg as needed  urology if no better  Problem # 2:  DYSURIA (ICD-788.1)  Her updated medication list for this problem includes:    Enablex 7.5 Mg Xr24h-tab (Darifenacin hydrobromide) .Marland Kitchen... 1 by mouth once daily  Orders: T-Culture, Urine ([id number])  Encouraged to push clear liquids, get enough rest, and take acetaminophen as needed. To be seen in 10 days if no improvement, sooner if worse.  Complete Medication List: 1)  Ambien 5 Mg Tabs (Zolpidem tartrate) .Marland Kitchen.. 1 by mouth at bedtime 2)  Vit-d 1000iu  .Marland Kitchen.. 1 by mouth once daily 3)  Mvi-centrum Silver  .Marland Kitchen.. 1 by mouth once daily 4)  Calcium 600mg   .... 1 by mouth two times a day 5)  Fish Oil 1000 Mg Caps (Omega-3 fatty acids) .Marland Kitchen.. 1 by mouth once daily 6)  Lutein 40 Mg Caps (Lutein) .Marland Kitchen.. 1 by mouth once daily 7)  Synthroid 75 Mcg Tabs (Levothyroxine sodium) .Marland Kitchen.. 1 by mouth once daily 8)  Abilify 2 Mg Tabs (Aripiprazole) .Marland Kitchen.. 1 by mouth once daily 9)  Zocor 40 Mg Tabs (Simvastatin) .... Take one tablet each evening at bedtime. 10)  Zocor 40 Mg Tabs (Simvastatin) .Marland Kitchen.. 1 by mouth at bedtime 11)  Fosamax  70 Mg Tabs (Alendronate sodium) .... Take 1 tab weekly 12)  Nasonex 50 Mcg/act Susp (Mometasone furoate) .... 2 sprays each nostril once daily 13)  Enablex 7.5 Mg Xr24h-tab (Darifenacin hydrobromide) .Marland Kitchen.. 1 by mouth once daily  Laboratory Results   Urine Tests   Date/Time Reported: May 09, 2009 2:18 PM  Routine Urinalysis   Color: yellow Appearance: Clear Glucose: negative   (Normal Range: Negative) Bilirubin: negative   (Normal Range: Negative) Ketone: negative   (Normal Range: Negative) Spec. Gravity: <1.005   (Normal Range: 1.003-1.035) Blood: trace-lysed   (Normal Range: Negative) pH: 6.0   (Normal Range: 5.0-8.0) Protein: negative   (Normal Range: Negative) Urobilinogen: 0.2   (Normal Range: 0-1) Nitrite: negative   (Normal Range:  Negative) Leukocyte Esterace: negative   (Normal Range: Negative)

## 2010-11-17 NOTE — Letter (Signed)
Summary: Results Follow-up Letter  [place] [facility]  [address] [place], [place] [address]   Phone: [phone]  Fax: [phone]    06/12/2009        [name] [name] [address] [place], [place]  [address]  Dear [name],   The following are the results of your recent test(s):  Test     Result     Pap Smear    Normal_______  Not Normal_____       Comments: _________________________________________________________ Cholesterol LDL(Bad cholesterol):          Your goal is less than:         HDL (Good cholesterol):        Your goal is more than: _________________________________________________________ Other Tests:   _________________________________________________________  Please call for an appointment Or Please see attached labs.___________________________________________________ _________________________________________________________ _________________________________________________________  Sincerely,  Felecia Deloach CMA [place] [facility]

## 2010-11-17 NOTE — Assessment & Plan Note (Signed)
Summary: urinary frequency/kdc   Vital Signs:  Patient profile:   77 year old female Weight:      146 pounds Temp:     98.0 degrees F oral Pulse rate:   96 / minute Pulse rhythm:   regular BP sitting:   140 / 86  (left arm) Cuff size:   regular  Vitals Entered By: Army Fossa CMA (November 04, 2009 11:32 AM) CC: Urinary frequency, burning x 3 days , Dysuria   History of Present Illness:  Dysuria      This is a 77 year old woman who presents with Dysuria.  The symptoms began 3 days ago.  The patient complains of burning with urination and urinary frequency, but denies urgency, hematuria, vaginal discharge, vaginal itching, vaginal sores, and penile discharge.  The patient denies the following associated symptoms: nausea, vomiting, fever, shaking chills, flank pain, abdominal pain, back pain, pelvic pain, and arthralgias.  The patient denies the following risk factors: diabetes, prior antibiotics, immunosuppression, history of GU anomaly, history of pyelonephritis, pregnancy, history of STD, and analgesic abuse.    Allergies: 1)  ! Wellbutrin 2)  ! * Nitrofurantoin  Past History:  Past medical, surgical, family and social histories (including risk factors) reviewed for relevance to current acute and chronic problems.  Past Medical History: Reviewed history from 12/21/2007 and no changes required. HYPOTHYROIDISM (ICD-244.9) INSOMNIA (ICD-780.52) PSORIASIS (ICD-696.1) ECZEMA (ICD-692.9) OSTEOPOROSIS (ICD-733.00) FAMILY HISTORY OF COLON CA 1ST DEGREE RELATIVE <60 (ICD-V16.0) BIPOLAR Osteoarthritis-- LOW BACK  Past Surgical History: Reviewed history from 12/21/2007 and no changes required. Appendectomy Hysterectomy,  partial Tonsillectomy ventral hernia Lumpectomy  Family History: Reviewed history from 12/21/2007 and no changes required. MOTHER:LIVING FATHER:LIVING 4:BROTHERS-1LIVING Family History of Colon CA 1st degree relative <60: BROTHER(DECEASED) Family  History Lung cancer: BROTHER(DECEASED), FATHER Family History of Prostate CA 1st degree relative <50: FATHER HEART:BROTHER(DECEASED) Family History of Arthritis:FATHER Family History of Aneurysm Aortic-- brother (deceased)  Social History: Reviewed history from 12/21/2007 and no changes required. Retired-- Engineer, 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 Single Former Smoker Alcohol use-no Drug use-no Regular exercise-yes  Review of Systems      See HPI  Physical Exam  General:  Well-developed,well-nourished,in no acute distress; alert,appropriate and cooperative throughout examination Abdomen:  Bowel sounds positive,abdomen soft and non-tender without masses, organomegaly or hernias noted. Msk:  no back pain Psych:  Oriented X3 and normally interactive.     Impression & Recommendations:  Problem # 1:  DYSURIA (ICD-788.1)  Her updated medication list for this problem includes:    Cipro 500 Mg Tabs (Ciprofloxacin hcl) .Marland Kitchen... 1 by mouth two times a day  Encouraged to push clear liquids, get enough rest, and take acetaminophen as needed. To be seen in 10 days if no improvement, sooner if worse.  Orders: UA Dipstick w/o Micro (manual) ([address])  Problem # 2:  HEMATURIA UNSPECIFIED (ICD-599.70)  may be secondary to number 1---recheck 2 weeks  Her updated medication list for this problem includes:    Cipro 500 Mg Tabs (Ciprofloxacin hcl) .Marland Kitchen... 1 by mouth two times a day  Orders: T-Culture, Urine ([id number]) UA Dipstick w/o Micro (manual) (91478)  Discussed medication use and hematuria work-up.   Complete Medication List: 1)  Ambien 5 Mg Tabs (Zolpidem tartrate) .Marland Kitchen.. 1 by mouth at bedtime 2)  Vit-d 1000iu  .Marland Kitchen.. 1 by mouth once daily 3)  Mvi-centrum Silver  .Marland Kitchen.. 1 by mouth once daily 4)  Calcium 600mg   .... 1 by mouth two times a day 5)  Fish Oil  1000 Mg Caps (Omega-3 fatty acids) .Marland Kitchen.. 1 by mouth once daily 6)  Lutein 40 Mg Caps (Lutein) .Marland Kitchen.. 1 by mouth once daily 7)  Synthroid 75 Mcg Tabs  (Levothyroxine sodium) .Marland Kitchen.. 1 by mouth once daily 8)  Abilify 2 Mg Tabs (Aripiprazole) .Marland Kitchen.. 1 by mouth once daily 9)  Zocor 40 Mg Tabs (Simvastatin) .... Take one tablet each evening at bedtime. 10)  Zocor 40 Mg Tabs (Simvastatin) .Marland Kitchen.. 1 by mouth at bedtime 11)  Fosamax 70 Mg Tabs (Alendronate sodium) .... Take 1 tab weekly 12)  Nasonex 50 Mcg/act Susp (Mometasone furoate) .... 2 sprays each nostril once daily 13)  Aciphex 20 Mg Tbec (Rabeprazole sodium) .Marland Kitchen.. 1 by mouth once daily 14)  Promethazine Hcl 25 Mg Tabs (Promethazine hcl) .Marland Kitchen.. 1 by mouth qid as needed 15)  Cipro 500 Mg Tabs (Ciprofloxacin hcl) .Marland Kitchen.. 1 by mouth two times a day Prescriptions: FOSAMAX 70 MG TABS (ALENDRONATE SODIUM) take 1 tab weekly  #12 x 3   Entered and Authorized by:   Loreen Freud DO   Signed by:   Loreen Freud DO on 11/04/2009   Method used:   Electronically to        [facility]* (mail-order)             ,          Ph: [phone]       Fax: [phone]   RxID:   [id number] CIPRO 500 MG TABS (CIPROFLOXACIN HCL) 1 by mouth two times a day  #10 x 0   Entered and Authorized by:   Loreen Freud DO   Signed by:   Loreen Freud DO on 11/04/2009   Method used:   Electronically to        [facility] [phone][facility] (retail)       [address]       [place], [place]  [address]       Ph: [phone]       Fax: [phone]   RxID:   [id number]   Laboratory Results   Urine Tests    Routine Urinalysis   Color: yellow Appearance: Clear Glucose: negative   (Normal Range: Negative) Bilirubin: negative   (Normal Range: Negative) Ketone: negative   (Normal Range: Negative) Spec. Gravity: 1.010   (Normal Range: 1.003-1.035) Blood: moderate   (Normal Range: Negative) pH: 6.0   (Normal Range: 5.0-8.0) Protein: negative   (Normal Range: Negative) Urobilinogen: 0.2   (Normal Range: 0-1) Nitrite: negative   (Normal Range: Negative) Leukocyte Esterace: negative   (Normal  Range: Negative)    Comments: Army Fossa CMA  November 04, 2009 11:40 AM

## 2010-11-17 NOTE — Progress Notes (Signed)
Summary: tsh and synthroid  Phone Note Refill Request Message from:  Patient  Refills Requested: Medication #1:  SYNTHROID 75 MCG  TABS 1 by mouth once daily Initial call taken by: Kandice Hams,  January 20, 2009 10:54 AM  Follow-up for Phone Call        Pt called for refill of her Synthroid, she is requesting more than a 6 months supply she says is VERY INCONVENIENT, Itold pt I will inform Dr Laury Axon.  Dr Laury Axon  WAS TSH DONE?  Follow-up by: Kandice Hams,  January 20, 2009 11:02 AM  Additional Follow-up for Phone Call Additional follow up Details #1::        TSH was ordered but I don't see it in labs---we need TSH. Pt can get synthroid for a year as long as tsh has been done within year and it is normal. Additional Follow-up by: Loreen Freud DO,  January 20, 2009 12:09 PM    Additional Follow-up for Phone Call Additional follow up Details #2::    Left message for patient to call the office. Ardyth Man  January 20, 2009 12:20 PM  Follow-up by: Ardyth Man,  January 20, 2009 12:20 PM  Additional Follow-up for Phone Call Additional follow up Details #3:: Details for Additional Follow-up Action Taken: PATIENT AWARE RX SENT TO PHARMACY BY DR. Laury Axon. Ardyth Man  January 20, 2009 1:55 PM  Additional Follow-up by: Ardyth Man,  January 20, 2009 1:55 PM    Prescriptions: SYNTHROID 75 MCG  TABS (LEVOTHYROXINE SODIUM) 1 by mouth once daily  #90 x 1   Entered by:   Ardyth Man   Authorized by:   Loreen Freud DO   Signed by:   Ardyth Man on 01/20/2009   Method used:   Electronically to        [facility]* (mail-order)             ,          Ph: [phone]       Fax: [phone]   RxID:   [id number] SYNTHROID 75 MCG  TABS (LEVOTHYROXINE SODIUM) 1 by mouth once daily  #90 x 1   Entered by:   Kandice Hams   Authorized by:   Loreen Freud DO   Signed by:   Kandice Hams on 01/20/2009   Method used:   Print then Give to Patient   RxID:   [id number]

## 2010-11-17 NOTE — Progress Notes (Signed)
Summary: PRIOR AUTH APPROVED FOR AMBIEN 5 MG -PER [facility]  Phone Note Refill Request   Initial call taken by: Kandice Hams,  August 15, 2007 4:14 PM       PRIOR AUTH APPROVED FOR [name] FROM 10-27,08 TO 08/13/08 [facility] FAXED

## 2010-11-17 NOTE — Assessment & Plan Note (Signed)
Summary: BLOOD IN STOOL/KDC   Vital Signs:  Patient profile:   77 year old female Weight:      142 pounds Temp:     98.3 degrees F oral Pulse rate:   97 / minute Pulse rhythm:   regular BP sitting:   132 / 86  (left arm) Cuff size:   regular  Vitals Entered By: Army Fossa CMA (January 01, 2010 3:53 PM) CC: Pt c/o when wipeing this a.m. saw blood, none in the actual stool.    History of Present Illness: Pt here c/o blood on TP this am.  Pt had soft BM,  no straining,  no abd pain,   Pt had colonoscopyin 2009 ---1 polyp.    No other complaints.      Allergies: 1)  ! Wellbutrin 2)  ! * Nitrofurantoin  Past History:  Past medical, surgical, family and social histories (including risk factors) reviewed for relevance to current acute and chronic problems.  Past Medical History: Reviewed history from 12/21/2007 and no changes required. HYPOTHYROIDISM (ICD-244.9) INSOMNIA (ICD-780.52) PSORIASIS (ICD-696.1) ECZEMA (ICD-692.9) OSTEOPOROSIS (ICD-733.00) FAMILY HISTORY OF COLON CA 1ST DEGREE RELATIVE <60 (ICD-V16.0) BIPOLAR Osteoarthritis-- LOW BACK  Past Surgical History: Reviewed history from 12/21/2007 and no changes required. Appendectomy Hysterectomy,  partial Tonsillectomy ventral hernia Lumpectomy  Family History: Reviewed history from 12/21/2007 and no changes required. MOTHER:LIVING FATHER:LIVING 4:BROTHERS-1LIVING Family History of Colon CA 1st degree relative <60: BROTHER(DECEASED) Family History Lung cancer: BROTHER(DECEASED), FATHER Family History of Prostate CA 1st degree relative <50: FATHER HEART:BROTHER(DECEASED) Family History of Arthritis:FATHER Family History of Aneurysm Aortic-- brother (deceased)  Social History: Reviewed history from 12/21/2007 and no changes required. 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 Lungs:  Normal respiratory effort, chest expands symmetrically. Lungs are clear to auscultation, no crackles or wheezes. Heart:  Normal rate and regular rhythm. S1 and S2 normal without gallop, murmur, click, rub or other extra sounds. Abdomen:  Bowel sounds positive,abdomen soft and non-tender without masses, organomegaly or hernias noted. Rectal:  small ext hemorrhoids heme neg brown stool Psych:  Cognition and judgment appear intact. Alert and cooperative with normal attention span and concentration. No apparent delusions, illusions, hallucinations   Impression & Recommendations:  Problem # 1:  HEMORRHOIDS, EXTERNAL (ICD-455.3) increase fiber in diet  proctosol cream two times a day  check stool cards consider GI if occurs again  Complete Medication List: 1)  Ambien 5 Mg Tabs (Zolpidem tartrate) .Marland Kitchen.. 1 by mouth at bedtime 2)  Vit-d 1000iu  .Marland Kitchen.. 1 by mouth once daily 3)  Mvi-centrum Silver  .Marland Kitchen.. 1 by mouth once daily 4)  Calcium 600mg   .... 1 by mouth two times a day 5)  Fish Oil 1000 Mg Caps (Omega-3 fatty acids) .Marland Kitchen.. 1 by mouth once daily 6)  Lutein 40 Mg Caps (Lutein) .Marland Kitchen.. 1 by mouth once daily 7)  Synthroid 75 Mcg Tabs (Levothyroxine sodium) .Marland Kitchen.. 1 by mouth once daily 8)  Abilify 2 Mg Tabs (Aripiprazole) .Marland Kitchen.. 1 by mouth once daily 9)  Zocor 40 Mg Tabs (Simvastatin) .... Take one tablet each evening at bedtime. 10)  Zocor 40 Mg Tabs (Simvastatin) .Marland Kitchen.. 1 by mouth at bedtime 11)  Fosamax 70 Mg Tabs (Alendronate sodium) .... Take 1 tab weekly 12)  Nasonex 50 Mcg/act Susp (Mometasone furoate) .... 2 sprays each nostril once daily 13)  Aciphex 20 Mg Tbec (Rabeprazole sodium) .Marland Kitchen.. 1 by mouth once daily 14)  Promethazine Hcl 25 Mg Tabs (Promethazine hcl) .Marland Kitchen.. 1 by mouth qid as needed 15)  Bactrim Ds 800-160 Mg Tabs  (Sulfamethoxazole-trimethoprim) .... Take 1 by mouth two times a day for 7 days 16)  Proctosol Hc 2.5 % Crea (Hydrocortisone) .... Apply two times a day Prescriptions: PROCTOSOL HC 2.5 % CREA (HYDROCORTISONE) apply two times a day  #2 weeks x 1   Entered and Authorized by:   Loreen Freud DO   Signed by:   Loreen Freud DO on 01/01/2010   Method used:   [facility] to        [facility] [phone][facility] (retail)       [address]       [place], [place]  [address]       Ph: [phone]       Fax: [phone]   RxID:   [id number]

## 2010-11-17 NOTE — Progress Notes (Signed)
Summary: frequency/urinate-d lowne   Phone Note Call from Patient Call back at Home Phone [phone]   Caller: Patient Summary of Call: patient is haveing frequency to urinate x's 1 week - now she is having discomfort x's a few days  Initial call taken by: Okey Regal [place],  April 22, 2008 4:30 PM  Follow-up for Phone Call        Patient coming in tomorrow at 2:45 PM Ardyth Man  April 22, 2008 4:35 PM  Follow-up by: Ardyth Man,  April 22, 2008 4:35 PM

## 2010-11-17 NOTE — Assessment & Plan Note (Signed)
Summary: roa med refill,cbs   Vital Signs:  Patient Profile:   77 Years Old Female Weight:      136.6 pounds Temp:     98.4 degrees F oral Pulse rate:   88 / minute BP sitting:   100 / 68  (left arm) Cuff size:   regular  Vitals Entered By: Shonna Chock (September 01, 2007 10:29 AM)                 PCP:  Laury Axon  Chief Complaint:  REFILL MEDICATIONS.  History of Present Illness: Pt here for med refills.  Pt has been on Ambien 5 years daily---  and was never told she shouldn't  take it everyday and she never had a sleep study.  Current Allergies: ! [facility]  Past Medical History:    Osteoporosis    Hypothyroidism   Family History:    MOTHER:LIVING    FATHER:LIVING    4:BROTHERS-1LIVING    Family History of Colon CA 1st degree relative <60: BROTHER(DECEASED)    Family History Lung cancer: BROTHER(DECEASED), FATHER    Family History of Prostate CA 1st degree relative <50: FATHER    HEART:BROTHER(DECEASED)    Family History of Arthritis:FATHER   Risk Factors:  Tobacco use:  quit    Year quit:  AGE 85 Alcohol use:  no Exercise:  yes    Times per week:  7    Type:  WALKING,YOGA   Review of Systems      See HPI   Physical Exam  General:     Well-developed,well-nourished,in no acute distress; alert,appropriate and cooperative throughout examination Psych:     Cognition and judgment appear intact. Alert and cooperative with normal attention span and concentration. No apparent delusions, illusions, hallucinations    Impression & Recommendations:  Problem # 1:  INSOMNIA (ICD-780.52) f/u cpe Her updated medication list for this problem includes:    Ambien 5 Mg Tabs (Zolpidem tartrate) .Marland Kitchen... 1 by mouth at bedtime  Orders: Sleep Disorder Referral (Sleep Disorder)--pulm Discussed sleep hygiene.  Orders: Sleep Disorder Referral (Sleep Disorder)   Problem # 2:  OSTEOPOROSIS (ICD-733.00) Pt to f/u for cpe Her updated medication list for this problem  includes:    Forteo 750 Mcg/13ml Soln (Teriparatide (recombinant)) .Marland Kitchen... subcutaneous daily   Problem # 3:  HYPOTHYROIDISM (ICD-244.9) RTO for labs Her updated medication list for this problem includes:    Synthroid 75 Mcg Tabs (Levothyroxine sodium) .Marland Kitchen... 1 by mouth once daily  Labs Reviewed: TSH: 2.69 (09/06/2006)    Chol: 185 (09/06/2006)   HDL: 50.2 (09/06/2006)   LDL: 119 (09/06/2006)   TG: 80 (09/06/2006)   Complete Medication List: 1)  Ambien 5 Mg Tabs (Zolpidem tartrate) .Marland Kitchen.. 1 by mouth at bedtime 2)  Forteo 750 Mcg/12ml Soln (Teriparatide (recombinant)) .... subcutaneous daily 3)  Vit-d 1000iu  .Marland Kitchen.. 1 by mouth once daily 4)  Mvi-centrum Silver  .Marland Kitchen.. 1 by mouth once daily 5)  Calcium 600mg   .... 1 by mouth two times a day 6)  Fish Oil 1000 Mg Caps (Omega-3 fatty acids) .Marland Kitchen.. 1 by mouth once daily 7)  Lutein 40 Mg Caps (Lutein) .Marland Kitchen.. 1 by mouth once daily 8)  Bd Mini Pen Ndl 31 G X73mm  .... As directed 9)  Alcohol Prep Pads  .... As directed 10)  Synthroid 75 Mcg Tabs (Levothyroxine sodium) .Marland Kitchen.. 1 by mouth once daily   Patient Instructions: 1)  f/u CPE    Prescriptions: AMBIEN 5 MG  TABS (  ZOLPIDEM TARTRATE) 1 by mouth at bedtime  #90 x 0   Entered and Authorized by:   Loreen Freud DO   Signed by:   Loreen Freud DO on 09/01/2007   Method used:   Print then Give to Patient   RxID:   [id number] SYNTHROID 75 MCG  TABS (LEVOTHYROXINE SODIUM) 1 by mouth once daily  #90 x 3   Entered and Authorized by:   Loreen Freud DO   Signed by:   Loreen Freud DO on 09/01/2007   Method used:   Print then Give to Patient   RxID:   [id number] ALCOHOL PREP PADS as directed  #box 200 x 3   Entered and Authorized by:   Loreen Freud DO   Signed by:   Loreen Freud DO on 09/01/2007   Method used:   Print then Give to Patient   RxID:   [id number] BD MINI PEN NDL 31 G X5MM as directed  #3 months x 3   Entered and Authorized by:   Loreen Freud DO   Signed by:    Loreen Freud DO on 09/01/2007   Method used:   Print then Give to Patient   RxID:   [id number] FORTEO 750 MCG/3ML  SOLN (TERIPARATIDE (RECOMBINANT)) SUBCUTANEOUS DAILY  #3 pens x 3   Entered and Authorized by:   Loreen Freud DO   Signed by:   Loreen Freud DO on 09/01/2007   Method used:   Print then Give to Patient   RxID:   [id number]  ]  Influenza Immunization History:    Influenza # 1:  Fluvax MCR (08/09/2007)

## 2010-11-17 NOTE — Assessment & Plan Note (Signed)
Summary: cpx//fd   Vital Signs:  Patient profile:   77 year old female Height:      66 inches Weight:      192 pounds Temp:     98.0 degrees F oral Pulse rate:   82 / minute Resp:     18 per minute BP sitting:   170 / 98  (left arm)  Vitals Entered By: Jeremy Johann CMA (September 04, 2010 1:07 PM)  Serial Vital Signs/Assessments:  Time      Position  BP       Pulse  Resp  Temp     By 2:00 PM             140/90                         Almeta Monas CMA (AAMA)  CC: cpx  Does patient need assistance? Functional Status Self care, Cook/clean, Shopping, Social activities Ambulation Normal Comments pt is able to do all adls and can read and write.  Vision Screening:      Vision Comments: optho-- q1y + glasses 40db HL: Left  Right  Audiometry Comment: grossly normal    History of Present Illness: Pt here for cpe and labs.  No complaints.  Pt has had some stress because her boyfriend broke his hip and is in [place].   optho--Dr Groat dentist -- Dr Yetta Barre   Preventive Screening-Counseling & Management  Alcohol-Tobacco     Alcohol drinks/day: 0     Smoking Status: quit     Year Quit: age 77     Pack years: 20-- 1ppd for 20 years     Passive Smoke Exposure: no  Caffeine-Diet-Exercise     Caffeine use/day: 3     Does Patient Exercise: yes     Type of exercise: WALKING,YOGA, silver sneakers     Times/week: 7  Hep-HIV-STD-Contraception     HIV Risk: no     Dental Visit-last 6 months yes     Dental Care Counseling: not indicated; dental care within six months  Safety-Violence-Falls     Seat Belt Use: 100     Firearms in the Home: no firearms in the home     Smoke Detectors: yes     Violence in the Home: no risk noted     Sexual Abuse: no     Fall Risk: no      Sexual History:  currently monogamous.    Current Medications (verified): 1)  Ambien 10 Mg Tabs (Zolpidem Tartrate) .Marland Kitchen.. 1 By Mouth At Bedtime 2)  Vit-D 1000iu .Marland Kitchen.. 1 By Mouth Once Daily 3)   Mvi-Centrum Silver .Marland Kitchen.. 1 By Mouth Once Daily 4)  Calcium 600mg  .... 1 By Mouth Two Times A Day 5)  Fish Oil 1000 Mg  Caps (Omega-3 Fatty Acids) .Marland Kitchen.. 1 By Mouth Once Daily 6)  Lutein 40 Mg  Caps (Lutein) .Marland Kitchen.. 1 By Mouth Once Daily 7)  Synthroid 75 Mcg  Tabs (Levothyroxine Sodium) .Marland Kitchen.. 1 By Mouth Once Daily 8)  Abilify 2 Mg  Tabs (Aripiprazole) .... 2  By Mouth Once Daily 9)  Zocor 40 Mg  Tabs (Simvastatin) .Marland Kitchen.. 1 By Mouth At Bedtime 10)  Fosamax 70 Mg Tabs (Alendronate Sodium) .... Take 1 Tab Weekly 11)  Nasonex 50 Mcg/act Susp (Mometasone Furoate) .... 2 Sprays Each Nostril Once Daily 12)  Aciphex 20 Mg Tbec (Rabeprazole Sodium) .Marland Kitchen.. 1 By Mouth Once Daily  Allergies (verified): 1)  !  Wellbutrin 2)  ! * Nitrofurantoin  Past History:  Past Medical History: Last updated: 12/21/2007 HYPOTHYROIDISM (ICD-244.9) INSOMNIA (ICD-780.52) PSORIASIS (ICD-696.1) ECZEMA (ICD-692.9) OSTEOPOROSIS (ICD-733.00) FAMILY HISTORY OF COLON CA 1ST DEGREE RELATIVE <60 (ICD-V16.0) BIPOLAR Osteoarthritis-- LOW BACK  Family History: Last updated: 12/21/2007 MOTHER:LIVING FATHER:LIVING 4:BROTHERS-1LIVING Family History of Colon CA 1st degree relative <60: BROTHER(DECEASED) Family History Lung cancer: BROTHER(DECEASED), FATHER Family History of Prostate CA 1st degree relative <50: FATHER HEART:BROTHER(DECEASED) Family History of Arthritis:FATHER Family History of Aneurysm Aortic-- brother (deceased)  Social History: Last updated: 12/21/2007 Retired-- Engineer, 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[employer] Single Former Smoker Alcohol use-no Drug use-no Regular exercise-yes  Risk Factors: Alcohol Use: 0 (09/04/2010) Caffeine Use: 3 (09/04/2010) Exercise: yes (09/04/2010)  Risk Factors: Smoking Status: quit (09/04/2010) Passive Smoke Exposure: no (09/04/2010)  Past Surgical History: Appendectomy Hysterectomy,  partial Tonsillectomy ventral hernia Lumpectomy Cataract extraction  b/l 2010  Family History: Reviewed  history from 12/21/2007 and no changes required. MOTHER:LIVING FATHER:LIVING 4:BROTHERS-1LIVING Family History of Colon CA 1st degree relative <60: BROTHER(DECEASED) Family History Lung cancer: BROTHER(DECEASED), FATHER Family History of Prostate CA 1st degree relative <50: FATHER HEART:BROTHER(DECEASED) Family History of Arthritis:FATHER Family History of Aneurysm Aortic-- brother (deceased)  Social History: Reviewed history from 12/21/2007 and no changes required. 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 Eyes:  Denies blurring, discharge, double vision, eye irritation, eye pain, halos, itching, light sensitivity, red eye, vision loss-1 eye, and vision loss-both eyes. ENT:  Denies decreased hearing, difficulty swallowing, ear discharge, earache, hoarseness, nasal congestion, nosebleeds, postnasal drainage, ringing in ears, sinus pressure, and sore throat. CV:  Denies bluish discoloration of lips or nails, chest pain or discomfort, difficulty breathing at night, difficulty breathing while lying down, fainting, fatigue, leg cramps with exertion, lightheadness, near fainting, palpitations, shortness of breath with exertion, swelling of feet, swelling of hands, and weight gain. Resp:  Denies chest discomfort, chest pain with inspiration, cough, coughing up blood, excessive snoring, hypersomnolence, morning headaches, pleuritic, shortness of breath, sputum productive, and wheezing. GI:  Denies abdominal pain, bloody stools, change in bowel habits, constipation, dark tarry stools, diarrhea, excessive appetite, gas, hemorrhoids, indigestion, loss of appetite, nausea, vomiting, vomiting  blood, and yellowish skin color. GU:  Denies abnormal vaginal bleeding, decreased libido, discharge, dysuria, genital sores, hematuria, incontinence, nocturia, urinary frequency, and urinary hesitancy. MS:  Denies joint pain, joint redness, joint swelling, loss of strength, low back pain, mid back pain, muscle aches, muscle , cramps, muscle weakness, stiffness, and thoracic pain. Derm:  Denies changes in color of skin, changes in nail beds, dryness, excessive perspiration, flushing, hair loss, insect bite(s), itching, lesion(s), poor wound healing, and rash. Neuro:  Denies brief paralysis, difficulty with concentration, disturbances in coordination, falling down, headaches, inability to speak, memory loss, numbness, poor balance, seizures, sensation of room spinning, tingling, tremors, visual disturbances, and weakness. Psych:  Denies alternate hallucination ( auditory/visual), anxiety, depression, easily angered, easily tearful, irritability, mental problems, panic attacks, sense of great danger, suicidal thoughts/plans, thoughts of violence, unusual visions or sounds, and thoughts /plans of harming others. Endo:  Denies cold intolerance, excessive hunger, excessive thirst, excessive urination, heat intolerance, polyuria, and weight change. Heme:  Denies abnormal bruising, bleeding, enlarge lymph  nodes, fevers, pallor, and skin discoloration. Allergy:  Denies hives or rash, itching eyes, persistent infections, seasonal allergies, and sneezing.   Impression & Recommendations:  Problem # 1:  PREVENTIVE HEALTH CARE (ICD-V70.0)  Orders: Venipuncture (16109) TLB-Lipid Panel (80061-LIPID) TLB-BMP (Basic Metabolic Panel-BMET) (80048-METABOL) TLB-CBC Platelet - w/Differential (85025-CBCD) TLB-Hepatic/Liver Function Pnl (80076-HEPATIC) TLB-TSH (Thyroid Stimulating Hormone) (84443-TSH) Specimen Handling (60454) Medicare -1st Annual Wellness Visit [phone]) EKG w/ Interpretation (93000)  Problem # 2:   HYPOTHYROIDISM (ICD-244.9)  Her updated medication list for this problem includes:    Synthroid 75 Mcg Tabs (Levothyroxine sodium) .Marland Kitchen... 1 by mouth once daily  Orders: Venipuncture (91478) TLB-Lipid Panel (80061-LIPID) TLB-BMP (Basic Metabolic Panel-BMET) (80048-METABOL) TLB-CBC Platelet - w/Differential (85025-CBCD) TLB-Hepatic/Liver Function Pnl (80076-HEPATIC) TLB-TSH (Thyroid Stimulating Hormone) (84443-TSH)  Labs Reviewed: TSH: 1.60 (06/02/2009)    HgBA1c: 5.6 (12/30/2008) Chol: 143 (12/30/2008)   HDL: 42.0 (12/30/2008)   LDL: 82 (12/30/2008)   TG: 95 (12/30/2008)  Problem # 3:  OSTEOPOROSIS (ICD-733.00)  Her updated medication list for this problem includes:    Fosamax 70 Mg Tabs (Alendronate sodium) .Marland Kitchen... Take 1 tab weekly  Orders: Specimen Handling (29562) Radiology Referral (Radiology) Prescription Created Electronically [phone])  Bone Density: osteopenia (10/08/2008) Vit D:88 (12/30/2008), 43 (12/21/2007)  Complete Medication List: 1)  Ambien 10 Mg Tabs (Zolpidem tartrate) .Marland Kitchen.. 1 by mouth at bedtime 2)  Vit-d 1000iu  .Marland Kitchen.. 1 by mouth once daily 3)  Mvi-centrum Silver  .Marland Kitchen.. 1 by mouth once daily 4)  Calcium 600mg   .... 1 by mouth two times a day 5)  Fish Oil 1000 Mg Caps (Omega-3 fatty acids) .Marland Kitchen.. 1 by mouth once daily 6)  Lutein 40 Mg Caps (Lutein) .Marland Kitchen.. 1 by mouth once daily 7)  Synthroid 75 Mcg Tabs (Levothyroxine sodium) .Marland Kitchen.. 1 by mouth once daily 8)  Abilify 2 Mg Tabs (Aripiprazole) .... 2  by mouth once daily 9)  Zocor 40 Mg Tabs (Simvastatin) .Marland Kitchen.. 1 by mouth at bedtime 10)  Fosamax 70 Mg Tabs (Alendronate sodium) .... Take 1 tab weekly 11)  Nasonex 50 Mcg/act Susp (Mometasone furoate) .... 2 sprays each nostril once daily 12)  Aciphex 20 Mg Tbec (Rabeprazole sodium) .Marland Kitchen.. 1 by mouth once daily  Other Orders: T-Culture, Urine (57846-96295) UA Dipstick W/ Micro (manual) (28413) Prescriptions: FOSAMAX 70 MG TABS (ALENDRONATE SODIUM) take 1 tab weekly  #12 x  3   Entered and Authorized by:   Loreen Freud DO   Signed by:   Loreen Freud DO on 09/04/2010   Method used:   Faxed to ...       [facility] (mail-order)             , [place]         Ph: [phone]       Fax: [phone]   RxID:   [id number]    Orders Added: 1)  Venipuncture [18841] 2)  TLB-Lipid Panel [80061-LIPID] 3)  TLB-BMP (Basic Metabolic Panel-BMET) [80048-METABOL] 4)  TLB-CBC Platelet - w/Differential [85025-CBCD] 5)  TLB-Hepatic/Liver Function Pnl [80076-HEPATIC] 6)  TLB-TSH (Thyroid Stimulating Hormone) [84443-TSH] 7)  Specimen Handling [99000] 8)  Specimen Handling [99000] 9)  T-Culture, Urine [66063-01601] 10)  UA Dipstick W/ Micro (manual) [81000] 11)  Radiology Referral [Radiology] 12)  Medicare -1st Annual Wellness Visit [G0438] 13)  EKG w/ Interpretation [93000] 14)  Prescription Created Electronically [G8553]    Last Flu Vaccine:  Fluvax MCR (08/09/2007 10:29:59 AM) Flu Vaccine Result Date:  07/08/2010 Flu Vaccine Result:  given Flu Vaccine Next Due:  1 yr Last Mammogram:  Normal Bilateral (11/02/2006 9:43:12 AM) Mammogram Result Date:  07/01/2010 Mammogram Result:  normal Mammogram Next Due:  1 yr     Laboratory Results   Urine Tests   Date/Time Reported: September 04, 2010 2:13 PM   Routine Urinalysis   Color: yellow Appearance: Clear Glucose: negative   (Normal Range: Negative) Bilirubin: negative   (Normal Range: Negative) Ketone: negative   (Normal Range: Negative) Spec. Gravity: <1.005   (Normal Range: 1.003-1.035) Blood: moderate   (Normal Range: Negative) pH: 7.0   (Normal Range: 5.0-8.0) Protein: negative   (Normal Range: Negative) Urobilinogen: negative   (Normal Range: 0-1) Nitrite: negative   (Normal Range: Negative) Leukocyte Esterace: negative   (Normal Range: Negative)    Comments: Floydene Flock  September 04, 2010 2:14 PM cx sent     Appended Document: cpx//fd     Vital Signs:  Patient profile:   77 year old  female Weight:      192 pounds BMI:     31.10  Allergies: 1)  ! Wellbutrin 2)  ! * Nitrofurantoin   Complete Medication List: 1)  Ambien 10 Mg Tabs (Zolpidem tartrate) .Marland Kitchen.. 1 by mouth at bedtime 2)  Vit-d 1000iu  .Marland Kitchen.. 1 by mouth once daily 3)  Mvi-centrum Silver  .Marland Kitchen.. 1 by mouth once daily 4)  Calcium 600mg   .... 1 by mouth two times a day 5)  Fish Oil 1000 Mg Caps (Omega-3 fatty acids) .Marland Kitchen.. 1 by mouth once daily 6)  Lutein 40 Mg Caps (Lutein) .Marland Kitchen.. 1 by mouth once daily 7)  Synthroid 75 Mcg Tabs (Levothyroxine sodium) .Marland Kitchen.. 1 by mouth once daily 8)  Abilify 2 Mg Tabs (Aripiprazole) .... 2  by mouth once daily 9)  Zocor 40 Mg Tabs (Simvastatin) .Marland Kitchen.. 1 by mouth at bedtime 10)  Fosamax 70 Mg Tabs (Alendronate sodium) .... Take 1 tab weekly 11)  Nasonex 50 Mcg/act Susp (Mometasone furoate) .... 2 sprays each nostril once daily 12)  Aciphex 20 Mg Tbec (Rabeprazole sodium) .Marland Kitchen.. 1 by mouth once daily 13)  Macrobid 100 Mg Caps (Nitrofurantoin monohyd macro) .Marland Kitchen.. 1 by mouth two times a day x7days

## 2010-11-17 NOTE — Progress Notes (Signed)
Summary: UTI CONCERNS  Phone Note Call from Patient   Summary of Call: PATIENT CAME IN TO GIVE URINE SAMPLE, PATIENT APPEARED VERY DISTURBED THAT SHE IS STILL HAVING UTI SYMPTOMS AFTER TAKING MACROBID. PATIENT STATES "MACROBID DIDNT HELP A BIT" PATIENT WOULD LIKE FOR ALL OF THIS TO BE CLEARED PRIOR TO HER CRUISE AND REQUESTED THAT THE DR. GIVE HER AND ABX TO BEGIN.  PER DR.LOWNE CIPRO 500MG  1 by mouth two times a day X 5 DAYS , IF NO BETTER OV NEEDED-NEXT STEP : POSSIBLE REFERRAL TO UROLOGIST. PATIENT OK'D ALL INSTRUCTION. Initial call taken by: Shonna Chock,  May 23, 2009 9:38 AM    New/Updated Medications: CIPROFLOXACIN HCL 500 MG TABS (CIPROFLOXACIN HCL) 1 by mouth two times a day X 5 DAYS Prescriptions: CIPROFLOXACIN HCL 500 MG TABS (CIPROFLOXACIN HCL) 1 by mouth two times a day X 5 DAYS  #10 x 0   Entered by:   Shonna Chock   Authorized by:   Loreen Freud DO   Signed by:   Shonna Chock on 05/23/2009   Method used:   [facility] to        [facility] [phone][facility] (retail)       [address]       [place], [place]  [address]       Ph: [phone]       Fax: [phone]   RxID:   [id number]

## 2010-11-17 NOTE — Progress Notes (Signed)
Summary: ambien refill  Phone Note Refill Request Message from:  Patient on Mar 07, 2008 11:52 AM  Refills Requested: Medication #1:  AMBIEN 5 MG  TABS 1 by mouth at bedtime 90 day supply-call to let patient know it was done wants faxed to [facility]  Initial call taken by: Doristine Devoid,  Mar 07, 2008 11:53 AM  Follow-up for Phone Call        Patient aware faxed to [facility]. Ardyth Man  Mar 07, 2008 12:14 PM  Follow-up by: Ardyth Man,  Mar 07, 2008 12:15 PM      Prescriptions: AMBIEN 5 MG  TABS (ZOLPIDEM TARTRATE) 1 by mouth at bedtime  #90 x 0   Entered by:   Ardyth Man   Authorized by:   Loreen Freud DO   Signed by:   Ardyth Man on 03/07/2008   Method used:   Print then Give to Patient   RxID:   [id number]

## 2010-11-17 NOTE — Progress Notes (Signed)
Summary: PHONE  Phone Note Call from Patient   Caller: Patient Summary of Call: PATIENT DAUGHTER IN LAW CALL STATING THAT HER MOTHER IN LAW FEEL THAT SHE NEEDS TO BE CATHERIZE. PATIENT IS GOING TO THE BATHROOM EVER 15 MINS. PER CHRAE TOLD TO GO TO THE URGENT CARE OR EMERGENCY ROOM. Initial call taken by: Barb Merino,  May 28, 2009 1:39 PM  Follow-up for Phone Call        [facility] BRIEFLY DESCRIBED THAT PATIENT IS CURRENTLY UNDER TREATMENT FOR UTI AND STILL WITH URINARY FREQUENCY EVERY 15 MIN. 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 I RECOMMENDED UC Follow-up by: Shonna Chock,  May 28, 2009 2:28 PM

## 2010-11-17 NOTE — Progress Notes (Signed)
Summary: refill for mail order [facility] DRE LOWNE SEE  Phone Note Call from Patient Call back at [facility] Phone [phone]   Caller: Patient Summary of Call: patient needs mail order refill for ambian 10 mg - with several refills - pt wil pick up   Initial call taken by: Okey Regal [place],  May 22, 2007 10:29 AM  Follow-up for Phone Call        I don't do several refills on ambien----  Only 1 rx a t a time.  Also when was the last time whe was seen.---- if not in last 12 months needs ov.  1 rx printed Follow-up by: Loreen Freud DO,  May 22, 2007 12:49 PM  Additional Follow-up for Phone Call Additional follow up Details #1::        SPOKE WITH PT RX READY FOR PICKUP PT LAST SEEN 11/07 Additional Follow-up by: Kandice Hams,  May 22, 2007 2:21 PM    New/Updated Medications: AMBIEN 5 MG  TABS (ZOLPIDEM TARTRATE) 1 by mouth at bedtime   Prescriptions: AMBIEN 5 MG  TABS (ZOLPIDEM TARTRATE) 1 by mouth at bedtime  #90 x 0   Entered and Authorized by:   Loreen Freud DO   Signed by:   Loreen Freud DO on 05/22/2007   Method used:   Print then Give to Patient   RxID:   [id number]

## 2010-11-17 NOTE — Letter (Signed)
Summary: External Correspondence-[facility] office visit  External Correspondence-[facility] office visit   Imported By: Vanessa Swaziland 10/11/2007 10:53:08  _____________________________________________________________________  External Attachment:    Type:   Image     Comment:   External Document

## 2010-11-17 NOTE — Progress Notes (Signed)
Summary: [name] [name]  for mailorder/DR LOWNE  Phone Note Refill Request Call back at [phone] Message from:  Patient on ambien  Refills Requested: Medication #1:  AMBIEN 5 MG  TABS 1 by mouth at bedtime pt requesting refill of Ambien  for [employer] , she said she has had a sleep study, and has bipolar, need this med to sleep.  Last rx #90 03/07/08, pt is aware Dr Rachael Darby out of office until AM and will address then  Initial call taken by: Kandice Hams,  July 17, 2008 2:24 PM  Follow-up for Phone Call        ok to refill #90  no refills Follow-up by: Loreen Freud DO,  July 17, 2008 10:27 PM  Additional Follow-up for Phone Call Additional follow up Details #1::        left msg for pt rc for Ambien faxed to [facility] .Kandice Hams  July 18, 2008 10:54 AM  Additional Follow-up by: Kandice Hams,  July 18, 2008 10:55 AM      Prescriptions: AMBIEN 5 MG  TABS (ZOLPIDEM TARTRATE) 1 by mouth at bedtime  #90 x 0   Entered by:   Kandice Hams   Authorized by:   Loreen Freud DO   Signed by:   Kandice Hams on 07/18/2008   Method used:   Printed then faxed to ...       [facility] (mail-order)             ,          Ph: [phone]       Fax: [phone]   RxID:   [id number]

## 2010-11-17 NOTE — Letter (Signed)
Summary: Primary Care Consult Scheduled Letter  [facility] [place]  [address] [place], [place] [address]   Phone: [phone]  Fax: [phone]      12/25/2007 MRN: [id number]  [facility] [address] [place], [place]  [address]    Dear Ms. Oberman,      We have scheduled an appointment for you.  At the recommendation of Dr.LOWNE, we have scheduled you a consult with DR JACOBS [facility] [address] on 03.25.09 @ 8:30 CK IN 8:15.  Their phone number is [phone].  If this appointment day and time is not convenient for you, please feel free to call the office of the doctor you are being referred to at the number listed above and reschedule the appointment.     It is important for you to keep your scheduled appointments. We are here to make sure you are given good patient care. If yu have questions or you have made changes to your appointment, please notify us at  [phone], ask for [facility].    Thank you,  Patient Care Coordinator [place] [facility]

## 2010-11-17 NOTE — Letter (Signed)
Summary: Internal Correspondence  Internal Correspondence   Imported By: Freddy Jaksch 04/05/2007 11:41:32  _____________________________________________________________________  External Attachment:    Type:   Image     Comment:   External Document

## 2010-11-17 NOTE — Progress Notes (Signed)
Summary: no better uti  Phone Note Call from Patient   Summary of Call: pt states that she just got med refill and she is still having nausea and burning. pt would like for another med to be call in for her. pt states that she plan to take a cruise in 15 days and she seem to be getting worse than better and she needs a med that works. [facility]  May 22, 2009 4:43 PM...  Follow-up for Phone Call        Pt needs to have urine repeated----  [facility] c&S refer to urology Follow-up by: Loreen Freud DO,  May 22, 2009 4:58 PM  Additional Follow-up for Phone Call Additional follow up Details #1::        pt aware.Felecia Deloach CMA  May 22, 2009 5:06 PM

## 2010-11-17 NOTE — Progress Notes (Signed)
Summary: 91-47,82-95---AOZHY culture  Phone Note Outgoing Call   Call placed by: [facility] CMA,  September 07, 2010 3:05 PM Details for Reason: + UTI---- macrobid 1 by mouth two times a day for 7 days recheck UA,  and C&S  2 weeks Summary of Call: left message to call office ...............Marland KitchenFelecia Deloach CMA  September 07, 2010 3:05 PM   pt left mssg advising to leave am msgs on vm.... Almeta Monas CMA Duncan Dull)  September 08, 2010 10:30 AM  Left message to call back.... Almeta Monas CMA Duncan Dull)  September 08, 2010 10:31 AM   Follow-up for Phone Call        pt aware.... Almeta Monas CMA Duncan Dull)  September 08, 2010 12:02 PM     New/Updated Medications: MACROBID 100 MG CAPS (NITROFURANTOIN MONOHYD MACRO) 1 by mouth two times a day x7days Prescriptions: MACROBID 100 MG CAPS (NITROFURANTOIN MONOHYD MACRO) 1 by mouth two times a day x7days  #14 x 0   Entered by:   Almeta Monas CMA (AAMA)   Authorized by:   Loreen Freud DO   Signed by:   Almeta Monas CMA (AAMA) on 09/08/2010   Method used:   [facility]        [facility] [phone][facility] (retail)       [address]       [place], [place]  [address]       Ph: [phone]       Fax: [phone]   RxID:   [id number]

## 2010-11-17 NOTE — Letter (Signed)
Summary: Internal Correspondence--MAMMOGRAM  Internal Correspondence--MAMMOGRAM   Imported By: Freddy Jaksch 04/05/2007 11:40:37  _____________________________________________________________________  External Attachment:    Type:   Image     Comment:   MAMMOGRAM

## 2010-11-17 NOTE — Progress Notes (Signed)
Summary: **90 DAY**REFILLS FOR SYNTHROID AND AMBIEN Dr Laury Axon SEE  Phone Note Call from Patient Call back at Home Phone [phone]   Caller: Patient Summary of Call: PATIENT NEEDS TWO ****90 DAY**** PRECRIPTIONS FOR   1) SYNTHROID TAB (L-THROX) 75 MCG------1 TABLET DAILY---NEEDS 3 REFILLS  2) ZOLPIDEM TARTRATE ( AMBIEN) 5MG      1 TABLET AT BEDTIME---NEEDS REFILLS  HER LETTER STATES:   " I M BIPOLAR AND NEED THE AMBIENTO GET SIX OR MORE HOURS OF SLEEP AT NIGHT.  YOU SENT ME TO A SLEEP DOCTOR REGARDING MY NEED TO REQUIRE A SLEEPING PILL."  PLEASE PRINT PRESCRIPTIONS AND CALL HER TO PICK THEM UP  WILL SEND LETTER TO [facility]   Initial call taken by: Jerolyn Shin,  October 23, 2008 4:07 PM  Follow-up for Phone Call        last refill ambien #90 no refill 07/17/08 last ov 04/23/08.  please advise on ambien request.  pt due for tsh in March can do 90 of synthroid  Follow-up by: Kandice Hams,  October 23, 2008 5:00 PM  Additional Follow-up for Phone Call Additional follow up Details #1::        She did see Dr Shelle Iron for sleep--- he wanted her to eventually come off Ambien---  she was supposed to discuss this with her psych---Dr plovsky----  I will fill #30 only and she should not take it everyday --- If her psych wants her on it every day---He needs to write it. Additional Follow-up by: Loreen Freud DO,  October 23, 2008 6:51 PM    Additional Follow-up for Phone Call Additional follow up Details #2::    pt has ben informed and rx up front for pickup .Kandice Hams  October 24, 2008 12:38 PM  Follow-up by: Kandice Hams,  October 24, 2008 12:38 PM    Prescriptions: SYNTHROID 75 MCG  TABS (LEVOTHYROXINE SODIUM) 1 by mouth once daily  #90 x 0   Entered by:   Kandice Hams   Authorized by:   Loreen Freud DO   Signed by:   Kandice Hams on 10/24/2008   Method used:   Print then Give to Patient   RxID:   [id number] AMBIEN 5 MG  TABS (ZOLPIDEM TARTRATE) 1 by mouth at bedtime  #30 x 0  Entered by:   Kandice Hams   Authorized by:   Loreen Freud DO   Signed by:   Kandice Hams on 10/24/2008   Method used:   Print then Give to Patient   RxID:   [id number]

## 2010-11-17 NOTE — Letter (Signed)
Summary: Results Follow-up Letter  [place] at [facility]  [address] [place], [place] [address]   Phone: [phone]  Fax: [phone]    05/28/2009        Vaeda Westall [address] [place], [place]  [address]  Dear Ms. Knight,   The following are the results of your recent test(s):  Test     Result     Pap Smear    Normal_______  Not Normal_____       Comments: _________________________________________________________ Cholesterol LDL(Bad cholesterol):          Your goal is less than:         HDL (Good cholesterol):        Your goal is more than: _________________________________________________________ Other Tests:   _________________________________________________________  Please call for an appointment Or Please see attached labs._________________________________________________________ _________________________________________________________ _________________________________________________________  Sincerely,  Felecia Deloach CMA [place] at [facility]

## 2010-11-17 NOTE — Letter (Signed)
Summary: Results Follow-up Letter  [place] at [facility]  [address] [place], [place] [address]   Phone: [phone]  Fax: [phone]    04/09/2008        [name] [name] [address] [place], [place]  [address]  Dear Ms. Friend,   The following are the results of your recent test(s):  Test     Result     Pap Smear    Normal_______  Not Normal_____       Comments: _________________________________________________________ Cholesterol LDL(Bad cholesterol):          Your goal is less than:         HDL (Good cholesterol):        Your goal is more than: _________________________________________________________ Other Tests:   _________________________________________________________  Please call for an appointment Or __Please see attached lab report._______________________________________________________ _________________________________________________________ _________________________________________________________  Sincerely,  Ardyth Man [place] at [facility]

## 2010-11-17 NOTE — Progress Notes (Signed)
Summary: Questions about appt  Phone Note Call from Patient   Caller: Patient Details for Reason: questions about appt Summary of Call: Mssg from pt and she wanted to know if she needed to be fasting for Friday c/b# [phone]... Almeta Monas CMA [facility] Dull)  September 02, 2010 5:06 PM  Initial call taken by: Almeta Monas CMA [facility]),  September 02, 2010 5:06 PM  Follow-up for Phone Call        PT aware if she would like to have labs done the same day she will need to fast otherwise she can have labs done another day. Pt ok, verbalized understanding ..........Marland KitchenFelecia Deloach CMA  September 03, 2010 9:19 AM

## 2010-11-17 NOTE — Letter (Signed)
Summary: Results Follow-up Letter  [place] at [facility]  [address] [place], [place] [address]   Phone: [phone]  Fax: [phone]    04/01/2008       [name] [name] [address] [place], [place]  [address]  Dear [name],   The following are the results of your recent test(s):  Test     Result     Pap Smear    Normal_______  Not Normal_____       Comments: _________________________________________________________ Cholesterol LDL(Bad cholesterol):          Your goal is less than:         HDL (Good cholesterol):        Your goal is more than: _________________________________________________________ Other Tests:   _________________________________________________________  Please call for an appointment Or _Please see attached.________________________________________________________ _________________________________________________________ _________________________________________________________  Sincerely,  Ardyth Man [place] at [facility]

## 2010-11-17 NOTE — Progress Notes (Signed)
  Phone Note Call from Patient Call back at Home Phone [phone]   Caller: Patient Call For: [name] Freud DO Summary of Call: Having problems with urinary frequency for about 6 months and would like to know if she can have enablex or toviaz. [facility] [address] Ardyth Man  May 07, 2009 3:08 PM   Follow-up for Phone Call        urine needs to be checked first.---needs OV Follow-up by: [name] Freud DO,  May 07, 2009 3:45 PM  Additional Follow-up for Phone Call Additional follow up Details #1::        patient aware and switched to scheduler. Ardyth Man  May 07, 2009 4:01 PM  Additional Follow-up by: Ardyth Man,  May 07, 2009 4:01 PM

## 2010-11-17 NOTE — Progress Notes (Signed)
Summary: refill  Phone Note Refill Request   Refills Requested: Medication #1:  SYNTHROID 75 MCG  TABS 1 by mouth once daily [facility].Felecia Deloach CMA  May 11, 2010 4:47 PM       Prescriptions: SYNTHROID 75 MCG  TABS (LEVOTHYROXINE SODIUM) 1 by mouth once daily  #90 x 0   Entered by:   Jeremy Johann CMA   Authorized by:   Loreen Freud DO   Signed by:   Jeremy Johann CMA on 05/11/2010   Method used:   Faxed to ...       [facility]* (retail)             ,          Ph: [phone]       Fax: [phone]   RxID:   [id number]

## 2010-11-19 NOTE — Progress Notes (Signed)
Summary: Refill request  Phone Note Refill Request   Refills Requested: Medication #1:  SYNTHROID 75 MCG  TABS 1 by mouth once daily  Medication #2:  FOSAMAX 70 MG TABS take 1 tab weekly RF request from [facility]   Method Requested: Fax to [facility] Pharm[facility] Initial call taken by: Almeta Monas CMA [facility]),  November 09, 2010 8:28 AM    Prescriptions: FOSAMAX 70 MG TABS (ALENDRONATE SODIUM) take 1 tab weekly  #12 x 3   Entered by:   Almeta Monas CMA (AAMA)   Authorized by:   Loreen Freud DO   Signed by:   Almeta Monas CMA (AAMA) on 11/09/2010   Method used:   Electronically to        [facility]* (retail)             ,          Ph: [phone]       Fax: [phone]   RxID:   [id number] SYNTHROID 75 MCG  TABS (LEVOTHYROXINE SODIUM) 1 by mouth once daily  #90 x 1   Entered by:   Almeta Monas CMA (AAMA)   Authorized by:   Loreen Freud DO   Signed by:   Almeta Monas CMA (AAMA) on 11/09/2010   Method used:   Electronically to        [facility]* (retail)             ,          Ph: [phone]       Fax: [phone]   RxID:   [id number]

## 2010-11-19 NOTE — Progress Notes (Signed)
Summary: Per fax from [facility] patient no longer has home delivery Benefits  Phone Note Outgoing Call   Call placed by: Almeta Monas CMA [facility] Dull),  November 10, 2010 1:29 PM Call placed to: Patient Details for Reason: Pt No longer has home delivery services--Per Fax from [facility]----Need local pharmacy  Summary of Call: Left message to call back.... Almeta Monas CMA [facility] Dull)  November 10, 2010 1:29 PM  spoke with patient and advised of the above, I advised her she can try calling [facility] to see what was going on, in the mean time I can fax to a local pharmacy...she agreed.Marland Kitchen Rx for a 90 day supply being [facility] [address].... Almeta Monas CMA Duncan Dull)  November 10, 2010 4:16 PM     Prescriptions: FOSAMAX 70 MG TABS (ALENDRONATE SODIUM) take 1 tab weekly  #12 x 3   Entered by:   Almeta Monas CMA (AAMA)   Authorized by:   Loreen Freud DO   Signed by:   Almeta Monas CMA (AAMA) on 11/10/2010   Method used:   [facility]ed to ...       [facility] [phone]* (retail)       [address]       [place], [place]  [address]       Ph: [phone]       Fax: [phone]   RxID:   [id number] SYNTHROID 75 MCG  TABS (LEVOTHYROXINE SODIUM) 1 by mouth once daily  #90 x 0   Entered by:   Almeta Monas CMA (AAMA)   Authorized by:   Loreen Freud DO   Signed by:   Almeta Monas CMA (AAMA) on 11/10/2010   Method used:   Faxed to ...       [facility] [phone]* (retail)       [address]       [place], [place]  [address]       Ph: [phone]       Fax: [phone]   RxID:   [id number]

## 2010-11-20 NOTE — Letter (Signed)
Summary: [facility]  [facility]   Imported By: Lanelle Bal 09/05/2009 09:50:13  _____________________________________________________________________  External Attachment:    Type:   Image     Comment:   External Document

## 2010-11-25 NOTE — Medication Information (Signed)
Summary: Patient No Longer Has Home Delivery Benefits/[facility]  Patient No Longer Has Home Delivery Benefits/[facility]   Imported By: Lanelle Bal 11/19/2010 09:20:21  _____________________________________________________________________  External Attachment:    Type:   Image     Comment:   External Document

## 2011-01-04 ENCOUNTER — Encounter: Payer: Self-pay | Admitting: Family Medicine

## 2011-01-04 ENCOUNTER — Other Ambulatory Visit: Payer: Self-pay | Admitting: Family Medicine

## 2011-01-04 ENCOUNTER — Ambulatory Visit (INDEPENDENT_AMBULATORY_CARE_PROVIDER_SITE_OTHER): Payer: Medicare Other | Admitting: Family Medicine

## 2011-01-04 DIAGNOSIS — N390 Urinary tract infection, site not specified: Secondary | ICD-10-CM

## 2011-01-04 LAB — CONVERTED CEMR LAB
Bilirubin Urine: NEGATIVE
Glucose, Urine, Semiquant: NEGATIVE
Ketones, urine, test strip: NEGATIVE
Nitrite: NEGATIVE
Protein, U semiquant: NEGATIVE
Specific Gravity, Urine: <1.005
Urobilinogen, UA: 0.2
pH: 6.5

## 2011-01-07 ENCOUNTER — Telehealth: Payer: Self-pay

## 2011-01-07 LAB — URINE CULTURE: Colony Count: >=100000

## 2011-01-07 MED ORDER — SULFAMETHOXAZOLE-TRIMETHOPRIM 800-160 MG PO TABS: 1.0000 | ORAL_TABLET | Freq: Two times a day (BID) | ORAL | Status: AC

## 2011-01-07 NOTE — Telephone Encounter (Signed)
Message copied by Almeta Monas on Thu Jan 07, 2011  1:29 PM ------      Message from: Loreen Freud      Created: Thu Jan 07, 2011 12:21 PM       + uti ---resistant to cipro      Bactrim ds 1 po bid for 7 days       Recheck urine 2 weeks----UA and culture

## 2011-01-07 NOTE — Telephone Encounter (Signed)
Spoke with patient and made her aware of her Urine culture results, she voiced understanding. She has been advised to stop taking the Cipro and to start the Bactrim. Rx sent [facility] per patient request..... KP

## 2011-01-14 NOTE — Assessment & Plan Note (Signed)
Summary: UTI//kp   Vital Signs:  Patient profile:   77 year old female Weight:      147.0 pounds Temp:     98.6 degrees F oral BP sitting:   122 / 74  (left arm) Cuff size:   regular  Vitals Entered By: Almeta Monas CMA [facility] Dull) (January 04, 2011 1:47 PM) CC: x 3 days c/o UTI, frequency and LBP, Dysuria   History of Present Illness:  Dysuria      This is a 77 year old woman who presents with Dysuria.  The symptoms began 4 days ago.  The patient complains of burning with urination and urinary frequency, but denies urgency, hematuria, vaginal discharge, vaginal itching, and vaginal sores.  Associated symptoms include back pain.  The patient denies the following associated symptoms: nausea, vomiting, fever, shaking chills, flank pain, abdominal pain, pelvic pain, and arthralgias.  The patient denies the following risk factors: diabetes, prior antibiotics, immunosuppression, history of GU anomaly, history of pyelonephritis, pregnancy, history of STD, and analgesic abuse.    Current Medications (verified): 1)  Ambien 10 Mg Tabs (Zolpidem Tartrate) .Marland Kitchen.. 1 By Mouth At Bedtime 2)  Vit-D 1000iu .Marland Kitchen.. 1 By Mouth Once Daily 3)  Mvi-Centrum Silver .Marland Kitchen.. 1 By Mouth Once Daily 4)  Calcium 600mg  .... 1 By Mouth Two Times A Day 5)  Fish Oil 1000 Mg  Caps (Omega-3 Fatty Acids) .Marland Kitchen.. 1 By Mouth Once Daily 6)  Lutein 40 Mg  Caps (Lutein) .Marland Kitchen.. 1 By Mouth Once Daily 7)  Synthroid 75 Mcg  Tabs (Levothyroxine Sodium) .Marland Kitchen.. 1 By Mouth Once Daily 8)  Abilify 2 Mg  Tabs (Aripiprazole) .... 2  By Mouth Once Daily 9)  Fosamax 70 Mg Tabs (Alendronate Sodium) .... Take 1 Tab Weekly 10)  Nasonex 50 Mcg/act Susp (Mometasone Furoate) .... 2 Sprays Each Nostril Once Daily 11)  Cipro 500 Mg Tabs (Ciprofloxacin Hcl) .Marland Kitchen.. 1 By Mouth Two Times A Day  Allergies (verified): 1)  ! Wellbutrin 2)  ! * Nitrofurantoin  Past History:  Past Medical History: Last updated: 12/21/2007 HYPOTHYROIDISM (ICD-244.9) INSOMNIA  (ICD-780.52) PSORIASIS (ICD-696.1) ECZEMA (ICD-692.9) OSTEOPOROSIS (ICD-733.00) FAMILY HISTORY OF COLON CA 1ST DEGREE RELATIVE <60 (ICD-V16.0) BIPOLAR Osteoarthritis-- LOW BACK  Past Surgical History: Last updated: 09/04/2010 Appendectomy Hysterectomy,  partial Tonsillectomy ventral hernia Lumpectomy Cataract extraction  b/l 2010  Family History: Last updated: 12/21/2007 MOTHER:LIVING FATHER:LIVING 4:BROTHERS-1LIVING Family History of Colon CA 1st degree relative <60: BROTHER(DECEASED) Family History Lung cancer: BROTHER(DECEASED), FATHER Family History of Prostate CA 1st degree relative <50: FATHER HEART:BROTHER(DECEASED) Family History of Arthritis:FATHER Family History of Aneurysm Aortic-- brother (deceased)  Social History: Last updated: 12/21/2007 Retired-- Engineer, 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 MOTHER:LIVING FATHER:LIVING 4:BROTHERS-1LIVING Family History of Colon CA 1st degree relative <60: BROTHER(DECEASED) Family History Lung cancer: BROTHER(DECEASED), FATHER Family History of Prostate CA 1st degree relative <50: FATHER HEART:BROTHER(DECEASED) Family History of Arthritis:FATHER Family History of Aneurysm Aortic-- brother (deceased)  Social History: Reviewed history from 12/21/2007 and no changes required. 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     Impression  & Recommendations:  Problem # 1:  UTI (ICD-599.0)  The following medications were removed from the medication list:    Macrobid 100 Mg Caps (Nitrofurantoin monohyd macro) .Marland Kitchen... 1 by mouth two times a day x7days Her updated medication list for this problem includes:    Cipro 500 Mg Tabs (Ciprofloxacin hcl) .Marland Kitchen... 1 by mouth two times a day  Orders: T-Culture, Urine ([id number]) UA Dipstick w/o Micro (manual) (57846)  Encouraged to push clear liquids, get enough rest, and take acetaminophen as needed. To be seen in 10 days if no improvement, sooner if worse.  Complete Medication List: 1)  Ambien 10 Mg Tabs (Zolpidem tartrate) .Marland Kitchen.. 1 by mouth at bedtime 2)  Vit-d 1000iu  .Marland Kitchen.. 1 by mouth once daily 3)  Mvi-centrum Silver  .Marland Kitchen.. 1 by mouth once daily 4)  Calcium 600mg   .... 1 by mouth two times a day 5)  Fish Oil 1000 Mg Caps (Omega-3 fatty acids) .Marland Kitchen.. 1 by mouth once daily 6)  Lutein 40 Mg Caps (Lutein) .Marland Kitchen.. 1 by mouth once daily 7)  Synthroid 75 Mcg Tabs (Levothyroxine sodium) .Marland Kitchen.. 1 by mouth once daily 8)  Abilify 2 Mg Tabs (Aripiprazole) .... 2  by mouth once daily 9)  Fosamax 70 Mg Tabs (Alendronate sodium) .... Take 1 tab weekly 10)  Nasonex 50 Mcg/act Susp (Mometasone furoate) .... 2 sprays each nostril once daily 11)  Cipro 500 Mg Tabs (Ciprofloxacin hcl) .Marland Kitchen.. 1 by mouth two times a day Prescriptions: CIPRO 500 MG TABS (CIPROFLOXACIN HCL) 1 by mouth two times a day  #10 x 0   Entered and Authorized by:   Loreen Freud DO   Signed by:   Loreen Freud DO on 01/04/2011   Method used:   [facility]        [facility] [phone][facility] (retail)       [address]       [place], [place]  [address]       Ph: [phone]       Fax: [phone]   RxID:   [id number]    Orders Added: 1)  T-Culture, Urine [[id number]] 2)  Est. Patient Level III [30160] 3)  UA Dipstick w/o Micro (manual) [81002]    Laboratory Results   Urine Tests    Routine  Urinalysis   Color: straw Appearance: Clear Glucose: negative   (Normal Range: Negative) Bilirubin: negative   (Normal Range: Negative) Ketone: negative   (Normal Range: Negative) Spec. Gravity: <1.005   (Normal Range: 1.003-1.035) Blood: moderate   (Normal Range: Negative) pH: 6.5   (Normal Range: 5.0-8.0) Protein: negative   (Normal Range: Negative) Urobilinogen: 0.2   (Normal Range: 0-1) Nitrite: negative   (Normal Range: Negative) Leukocyte Esterace: small   (Normal Range: Negative)

## 2011-02-16 ENCOUNTER — Other Ambulatory Visit: Payer: Self-pay

## 2011-02-16 MED ORDER — LEVOTHYROXINE SODIUM 75 MCG PO TABS: 75.0000 ug | ORAL_TABLET | Freq: Every day | ORAL | Status: DC

## 2011-03-02 NOTE — Assessment & Plan Note (Signed)
[facility]                         GASTROENTEROLOGY OFFICE NOTE   NAME:[name] [name], [name] [name]                       MRN:          [id number]  DATE:01/10/2008                            DOB:          1933-03-28    REFERRING PHYSICIAN:  Lelon Perla, DO   REASON FOR REFERRAL:  Dr. Laury Axon asked me to evaluate [name] [name] in  consultation regarding a personal history of colon polyps, family  history of colon cancer.   HPI:  [name] [name] is a very pleasant 77 year old woman, whose brother was  diagnosed with colon cancer.  She does not know exactly how old he was  when that happened, as she does not keep in very close touch with him.  She herself has had tubular adenomas removed by various  gastroenterologists.  She moved from [place] in 2004 and had multiple  colonoscopies there, at [facility], [place].  I do have path report  dating back to 1995, showing she had a small tubular adenoma removed.  Her most recent colonoscopy was in 2003 and she says that no polyps were  seen at that time.  She is not really having any trouble with  significant bowel symptoms.  She does have very mild constipation, but  that is easy for her control with fiber in her diet.  She has no  bleeding.   REVIEW OF SYSTEMS:  Notable for stable weight, otherwise essentially  normal and is available on our nursing intake sheet.   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   CURRENT MEDICINES:  Synthroid, Forteo, Ambien, vitamin D, multivitamin,  calcium, fish oil, Lutein Vision, Abilify.   ALLERGIES:  To WELLBUTRIN.   SOCIAL HISTORY:  Divorced, lives alone, retired Engineer, civil (consulting), nonsmoker,  nondrinker, has one son.   FAMILY HISTORY:  Brother had colon cancer.   PHYSICAL EXAM:  Patient is 5 feet 6 inches, 135 pounds.   Blood pressure  118/76, pulse 80.  CONSTITUTIONAL:  Generally well-appearing.  NEUROLOGIC:  Alert and oriented times three.  EYES:  Extraocular movements intact.  MOUTH:  Oropharynx moist, no lesions.  NECK:  Supple, no lymphadenopathy.  CARDIOVASCULAR/HEART:  Regular rate and rhythm.  LUNGS:  Clear to auscultation bilaterally.  ABDOMEN:  Soft, nontender, nondistended, normal bowel sounds.  EXTREMITIES:  No lower extremity edema.  SKIN:  No rashes or lesions on visible extremities.   ASSESSMENT AND PLAN:  Patient is a 77 year old woman with personal  history of tubular adenomas, family history of colon cancer (brother).   I did not mention above, but she did have recent blood testing, earlier  this month, showing normal CBC, as well as a normal complete metabolic  profile.  She has two indications for colonoscopy, including personal  history of colonic adenomas and family history of colon cancer in first-  degree relative, and therefore, we will arrange for her to have a full  colonoscopy performed at her soonest convenience.  She normally needs a  two-day prep  and so she will have two days of clear liquids prior to a  Movi Prep.  I see no reason for any further blood tests or imaging  studies prior to then.     Rachael Fee, MD  Electronically Signed    DPJ/MedQ  DD: 01/10/2008  DT: 01/10/2008  Job #: [id number]   cc:   Lelon Perla, DO

## 2011-03-02 NOTE — Assessment & Plan Note (Signed)
[facility]                             PULMONARY OFFICE NOTE   NAME:[name] [name]                       MRN:          [id number]  DATE:09/19/2007                            DOB:          1933-03-17    HISTORY OF PRESENT ILLNESS:  The patient is a very pleasant 77 year old  white female whom I have been asked to see for sleep onset insomnia.  The patient states her whole life she has had a very difficult time  unwinding, and very difficult for her to fall asleep.  She typically  will go to bed between 10:30 and 11 p.m. and would stare at the ceiling  and toss and turn all through the night.  She would ultimately fall  asleep at approximately 2 a.m. and would awaken at 7 a.m. and would not  feel rested and would be very tired all throughout the day.  She  recently has been placed on Ambien at 5 mg q.h.s. since June, and states  that with this she falls asleep very easily without difficulty and  awakens at 9 a.m. and feels extremely rested.  She currently feels that  her alertness during the day is excellent and has no complaints.  The  patient typically drinks three cups of coffee in the morning and one cup  of green tea between 1 p.m. and 2 p.m.  She does not drink caffeinated  sodas or iced tea.  She does not nap during the day.  She exercises at  least two miles a day, but never within three to four hours of bedtime.  Of note, the patient has recently been diagnosed with a bipolar illness.  She is seeing Dr. Archer Asa, and has been placed on Abilify.  The  patient is very upset with herself because she has not told Dr. Lelon Perla this.  The patient denies any snoring or pauses in her  breathing during sleep.  She has nothing by history to suggest  obstructive sleep apnea or periodic leg movements.   PAST MEDICAL/SURGICAL HISTORY:  1. Significant for allergic rhinitis with sinus difficulties.  2. Status post hysterectomy.  3. Status  post appendectomy.  4. History of hypothyroidism, for which she is on medication.   CURRENT MEDICATIONS:  1. Forteo 20 mcg daily.  2. Ambien 5 mg daily.  3. Synthroid 75 mcg daily.  4. Abilify 2 mg, two daily.   ALLERGIES/INTOLERANCES:  The patient has intolerance to [facility].   SOCIAL HISTORY:  She is divorced and has one son.  She has a history of  smoking cigarettes, one pack per day for 15 years, but has not smoked  since age 86.   FAMILY HISTORY:  Remarkable for father and brother having lung cancer.  A brother also had colon cancer.   REVIEW OF SYSTEMS:  As per the history of present illness, also see the  patient intake form documented in the chart.   PHYSICAL EXAMINATION:  GENERAL:  She is a thin white female, in no acute  distress.  VITAL SIGNS:  Blood pressure 126/78, pulse  97, temperature 98.5 degrees,  weight 137 pounds.  She is 5 feet 6 inches tall.  O2 saturation on room  air 97%.  HEENT:  Pupils equal, round, reactive to light and accommodation.  Extraocular muscles intact.  Nares patent without discharge.  Oropharynx  clear.  NECK:  Supple without jugular venous distention or lymphadenopathy.  No  palpable thyromegaly.  CHEST:  Totally clear.  HEART:  A regular rate and rhythm.  No murmurs, rubs or gallops.  ABDOMEN:  Soft, nontender, with good bowel sounds.  GENITAL/BREASTS/RECTAL:  Exams not done, not indicated.  EXTREMITIES:  Lower extremities show trace of edema.  Pulses intact  distally.  NEUROLOGIC:  Alert and oriented with no obvious motor deficits.   IMPRESSION:  1. Sleep onset insomnia that I suspect is secondary to her bipolar      disease and also inadequate sleep hygiene.  Unfortunately, the      patient frequently will watch television or read in bed, which I      think contributes to some of her sleep issues.  At this point in      time, she is seeing Dr. Donell Beers and has been placed on Abilify.  I      would hope that with control of her  bipolar disease, along with      better sleep hygiene, that she should be able to get over this and      possibly ultimately get off of Ambien.   PLAN:  1. I have asked the patient to work on ritualistic behaviors at night      that she can do for 30 to 60 minutes prior to going to bed each      night, in order to try to train her brain to turn itself off.  2. No watching TV or reading in the bedroom.  I have explained to her      that the bedroom is only for sleep.  3. The patient has followup with Dr. Donell Beers.  I will leave this issue      in his very capable hands.  She may need cognitive behavioral      therapy, to help with this.  I would hope that over time she will      be able to come off the Ambien.  4. The patient will follow up on a p.r.n. basis.     Barbaraann Share, MD,FCCP  Electronically Signed    [facility]Med[facility]  DD: 09/19/2007  DT: 09/19/2007  Job #: [id number]   cc:   Lelon Perla, DO  Archer Asa, M.D.

## 2011-03-05 NOTE — Op Note (Signed)
[name] [name], [name] [name]                          ACCOUNT NO.:  [id number]   MEDICAL RECORD NO.:  [id number]                   PATIENT TYPE:  AMB   LOCATION:  DAY                                  FACILITY:  [facility]   PHYSICIAN:  Jimmye Norman III, M.D.               DATE OF BIRTH:  1933/04/27   DATE OF PROCEDURE:  09/27/2003  DATE OF DISCHARGE:                                 OPERATIVE REPORT   PREOPERATIVE DIAGNOSIS:  Right lower quadrant ventral hernia at previous  incisional site for appendectomy.   POSTOPERATIVE DIAGNOSIS:  Right lower quadrant ventral hernia at previous  incisional site for appendectomy.   PROCEDURE:  Repair of right lower quadrant ventral hernia with mesh.   SURGEON:  Jimmye Norman, M.D.   ASSISTANT:  Anselm Pancoast. Zachery Dakins, M.D.   ANESTHESIA:  General endotracheal.   ESTIMATED BLOOD LOSS:  Less than 50 mL.   COMPLICATIONS:  None.   CONDITION:  Stable.   FINDINGS:  The patient had [name] 6 x 3 cm fascial defect in the internal oblique  and external oblique fascia and muscle.  Those more incarcerated are  compromised bowel.   INDICATION FOR OPERATION:  The patient is [name] 77 year old female, in otherwise  good condition, who comes in with [name] hernia at her previous appendectomy  site.   OPERATION:  The patient was taken to the operating room, placed on the table  in [name] supine position.  After an adequate endotracheal anesthetic was  administered, she was prepped and draped in the usual sterile manner,  exposing the right lower quadrant.   The patient's old previous well-healed incision was used as the site for  this incision.  It was [name] transverse incision approximately 7 cm long.  It  was taken down into the subcu.  We subsequently dissected out the hernia sac  circumferentially from the surrounding subcu down to the fascia of the  external oblique.  At that point, we incised and excised the hernia sac at  the edge of the external oblique fascia and sent it on  separately as [name]  specimen.  Contained within the hernia sac was omentum which was freely  mobile and easily reduced into the peritoneal cavity.   Once we had released the hernia sac, we closed the hernia defect primarily  using figure-of-eight stitches of #1 Novofil.  We made sure to include the  internal oblique fascia and the external oblique fascia with the repair.  Once this was repaired and closed primarily, taking care not to injure  internal structures, we did an onlay mesh of polypropylene, measuring  approximately 5 x 3 cm.  We tagged that down using [name] running stitch of 0  Prolene at the margins overlying the primary repair.  The mesh had been  soaked in antibiotic solution prior to implantation, and we also irrigated  the wound with antibiotic solution.  The inferolateral part of  this mesh was  attached to the fascia just overlying the anterior-superior iliac spine.   Once the mesh was implanted, we closed the abdomen.  We closed the fascia.  The underlying subcu was closed using [name] running 3-0 Vicryl suture.  Then  0.5% Marcaine without epinephrine was injected into the wound and the  incision.  Once the subcu was closed, the skin was closed using running  subcuticular stitch of 4-0 Vicryl.  All needle counts, sponge counts, and  instrument counts were correct.  Sterile dressing was applied.                                               Kathrin Ruddy, M.D.    JW/MEDQ  D:  09/27/2003  T:  09/27/2003  Job:  [id number]

## 2011-04-19 ENCOUNTER — Encounter: Payer: Self-pay | Admitting: Family Medicine

## 2011-04-19 ENCOUNTER — Ambulatory Visit (INDEPENDENT_AMBULATORY_CARE_PROVIDER_SITE_OTHER): Payer: Medicare Other | Admitting: Family Medicine

## 2011-04-19 VITALS — BP 148/92 | HR 70 | Temp 98.8°F | Wt 145.8 lb

## 2011-04-19 DIAGNOSIS — R300 Dysuria: Secondary | ICD-10-CM

## 2011-04-19 LAB — POCT URINALYSIS DIPSTICK
Bilirubin, UA: NEGATIVE
Glucose, UA: NEGATIVE
Ketones, UA: NEGATIVE
Leukocytes, UA: NEGATIVE
Nitrite, UA: NEGATIVE
Protein, UA: NEGATIVE
Spec Grav, UA: 1.005
Urobilinogen, UA: 0.2
pH, UA: 7

## 2011-04-19 MED ORDER — CIPROFLOXACIN HCL 500 MG PO TABS: 500.0000 mg | ORAL_TABLET | Freq: Two times a day (BID) | ORAL | Status: AC

## 2011-04-19 NOTE — Progress Notes (Signed)
  Subjective:    [name] [name] is a 77 y.o. female who complains of burning with urination and frequency. She has had symptoms for 6 days. Patient also complains of back pain and chills. Patient denies congestion, cough, fever, headache, rhinitis, sorethroat, stomach ache and vaginal discharge. Patient does not have a history of recurrent UTI. Patient does not have a history of pyelonephritis.   The following portions of the patient's history were reviewed and updated as appropriate: allergies, current medications, past family history, past medical history, past social history, past surgical history and problem list.  Review of Systems Pertinent items are noted in HPI.    Objective:    BP 148/92  Pulse 70  Temp(Src) 98.8 F (37.1 C) (Oral)  Wt 145 lb 12.8 oz (66.134 kg)  SpO2 96% General appearance: alert, cooperative, appears stated age and no distress Back: no cva tenderness Abdomen: soft, non-tender; bowel sounds normal; no masses,  no organomegaly  Laboratory:  Urine dipstick: + for hemoglobin.   Micro exam: not done.    Assessment:    UTI     Plan:    Medications: ciprofloxacin. Maintain adequate hydration. f/u 2 weeks to recheck urine

## 2011-04-19 NOTE — Patient Instructions (Signed)
Urinary Tract Infection (UTI)   Infections of the urinary tract can start in several places. A bladder infection (cystitis), a kidney infection (pyelonephritis), and a prostate infection (prostatitis) are different types of urinary tract infections. They usually get better if treated with medicines (antibiotics) that kill germs. Take all the medicine until it is gone. You or your child may feel better in a few days, but TAKE ALL MEDICINE or the infection may not respond and may become more difficult to treat.   HOME CARE INSTRUCTIONS   Drink enough water and fluids to keep the urine clear or pale yellow. Cranberry juice is especially recommended, in addition to large amounts of water.   Avoid caffeine, tea, and carbonated beverages. They tend to irritate the bladder.   Alcohol may irritate the prostate.   Only take over-the-counter or prescription medicines for pain, discomfort, or fever as directed by your caregiver.   FINDING OUT THE RESULTS OF YOUR TEST   Not all test results are available during your visit. If your or your child's test results are not back during the visit, make an appointment with your caregiver to find out the results. Do not assume everything is normal if you have not heard from your caregiver or the medical facility. It is important for you to follow up on all test results.   TO PREVENT FURTHER INFECTIONS:   Empty the bladder often. Avoid holding urine for long periods of time.   After a bowel movement, women should cleanse from front to back. Use each tissue only once.   Empty the bladder before and after sexual intercourse.   SEEK MEDICAL CARE IF:   There is back pain.   You or your child has an oral temperature above 100.4.   Your baby is older than 3 months with a rectal temperature of 100.5º F (38.1° C) or higher for more than 1 day.   Your or your child's problems (symptoms) are no better in 3 days. Return sooner if you or your child is getting worse.   SEEK IMMEDIATE MEDICAL CARE IF:    There is severe back pain or lower abdominal pain.   You or your child develops chills.   You or your child has an oral temperature above 100.4, not controlled by medicine.   Your baby is older than 3 months with a rectal temperature of 102º F (38.9º C) or higher.   Your baby is 3 months old or younger with a rectal temperature of 100.4º F (38º C) or higher.   There is nausea or vomiting.   There is continued burning or discomfort with urination.   MAKE SURE YOU:   Understand these instructions.   Will watch this condition.   Will get help right away if you or your child is not doing well or gets worse.   Document Released: 07/14/2005 Document Re-Released: 12/29/2009   ExitCare® Patient Information ©2011 [employer].

## 2011-04-22 LAB — URINE CULTURE: Colony Count: >=100000

## 2011-08-20 ENCOUNTER — Other Ambulatory Visit: Payer: Self-pay | Admitting: Orthopaedic Surgery

## 2011-08-20 DIAGNOSIS — M25561 Pain in right knee: Secondary | ICD-10-CM

## 2011-08-23 ENCOUNTER — Other Ambulatory Visit: Payer: Self-pay | Admitting: Family Medicine

## 2011-08-23 MED ORDER — LEVOTHYROXINE SODIUM 75 MCG PO TABS: 75.0000 ug | ORAL_TABLET | Freq: Every day | ORAL | Status: DC

## 2011-08-23 NOTE — Telephone Encounter (Signed)
Letter mailed to schedule a CPE.      KP 

## 2011-08-25 ENCOUNTER — Ambulatory Visit
Admission: RE | Admit: 2011-08-25 | Discharge: 2011-08-25 | Disposition: A | Discharge status: Home / Self Care | Payer: Medicare Other | Source: Ambulatory Visit | Attending: Orthopaedic Surgery | Admitting: Orthopaedic Surgery

## 2011-08-25 ENCOUNTER — Other Ambulatory Visit: Payer: Medicare Other

## 2011-08-25 DIAGNOSIS — M25561 Pain in right knee: Secondary | ICD-10-CM

## 2011-09-02 ENCOUNTER — Telehealth: Payer: Self-pay | Admitting: *Deleted

## 2011-09-02 NOTE — Telephone Encounter (Signed)
Spoke to sylvia from orthopedic surgical office requesting last EKG and last CBC results. Manually faxed to [phone]

## 2011-09-07 HISTORY — PX: KNEE ARTHROSCOPY: SUR90

## 2011-09-17 DIAGNOSIS — IMO0001 Reserved for inherently not codable concepts without codable children: Secondary | ICD-10-CM | POA: Diagnosis not present

## 2011-09-17 DIAGNOSIS — Z4889 Encounter for other specified surgical aftercare: Secondary | ICD-10-CM | POA: Diagnosis not present

## 2011-09-20 DIAGNOSIS — IMO0001 Reserved for inherently not codable concepts without codable children: Secondary | ICD-10-CM | POA: Diagnosis not present

## 2011-09-20 DIAGNOSIS — Z4889 Encounter for other specified surgical aftercare: Secondary | ICD-10-CM | POA: Diagnosis not present

## 2011-09-22 DIAGNOSIS — Z4889 Encounter for other specified surgical aftercare: Secondary | ICD-10-CM | POA: Diagnosis not present

## 2011-09-22 DIAGNOSIS — IMO0001 Reserved for inherently not codable concepts without codable children: Secondary | ICD-10-CM | POA: Diagnosis not present

## 2011-09-27 DIAGNOSIS — IMO0001 Reserved for inherently not codable concepts without codable children: Secondary | ICD-10-CM | POA: Diagnosis not present

## 2011-09-27 DIAGNOSIS — Z4889 Encounter for other specified surgical aftercare: Secondary | ICD-10-CM | POA: Diagnosis not present

## 2011-09-28 ENCOUNTER — Encounter: Payer: Self-pay | Admitting: Family Medicine

## 2011-09-29 DIAGNOSIS — IMO0001 Reserved for inherently not codable concepts without codable children: Secondary | ICD-10-CM | POA: Diagnosis not present

## 2011-09-29 DIAGNOSIS — Z4889 Encounter for other specified surgical aftercare: Secondary | ICD-10-CM | POA: Diagnosis not present

## 2011-09-30 ENCOUNTER — Ambulatory Visit (INDEPENDENT_AMBULATORY_CARE_PROVIDER_SITE_OTHER): Payer: Medicare Other | Admitting: Family Medicine

## 2011-09-30 ENCOUNTER — Encounter: Payer: Self-pay | Admitting: Family Medicine

## 2011-09-30 VITALS — BP 134/80 | HR 94 | Temp 98.4°F | Ht 66.0 in | Wt 141.2 lb

## 2011-09-30 DIAGNOSIS — Z13220 Encounter for screening for lipoid disorders: Secondary | ICD-10-CM

## 2011-09-30 DIAGNOSIS — M8580 Other specified disorders of bone density and structure, unspecified site: Secondary | ICD-10-CM

## 2011-09-30 DIAGNOSIS — Z0000 Encounter for general adult medical examination without abnormal findings: Secondary | ICD-10-CM

## 2011-09-30 DIAGNOSIS — Z1239 Encounter for other screening for malignant neoplasm of breast: Secondary | ICD-10-CM

## 2011-09-30 DIAGNOSIS — M899 Disorder of bone, unspecified: Secondary | ICD-10-CM

## 2011-09-30 DIAGNOSIS — L853 Xerosis cutis: Secondary | ICD-10-CM

## 2011-09-30 DIAGNOSIS — E039 Hypothyroidism, unspecified: Secondary | ICD-10-CM

## 2011-09-30 DIAGNOSIS — M949 Disorder of cartilage, unspecified: Secondary | ICD-10-CM

## 2011-09-30 LAB — CBC WITH DIFFERENTIAL/PLATELET
Basophils Absolute: 0 10*3/uL (ref 0.0–0.1)
Basophils Relative: 0.6 % (ref 0.0–3.0)
Eosinophils Absolute: 0 10*3/uL (ref 0.0–0.7)
Eosinophils Relative: 0 % (ref 0.0–5.0)
HCT: 41.9 % (ref 36.0–46.0)
Hemoglobin: 14.3 g/dL (ref 12.0–15.0)
Lymphocytes Relative: 27.3 % (ref 12.0–46.0)
Lymphs Abs: 2.2 10*3/uL (ref 0.7–4.0)
MCHC: 34.1 g/dL (ref 30.0–36.0)
MCV: 95.7 fl (ref 78.0–100.0)
Monocytes Absolute: 0.6 10*3/uL (ref 0.1–1.0)
Monocytes Relative: 7.7 % (ref 3.0–12.0)
Neutro Abs: 5.1 10*3/uL (ref 1.4–7.7)
Neutrophils Relative %: 64.4 % (ref 43.0–77.0)
Platelets: 299 10*3/uL (ref 150.0–400.0)
RBC: 4.38 Mil/uL (ref 3.87–5.11)
RDW: 14.1 % (ref 11.5–14.6)
WBC: 7.9 10*3/uL (ref 4.5–10.5)

## 2011-09-30 LAB — LIPID PANEL
Cholesterol: 189 mg/dL (ref 0–200)
HDL: 54.8 mg/dL (ref >39.00)
LDL Cholesterol: 117 mg/dL — ABNORMAL HIGH (ref 0–99)
Total CHOL/HDL Ratio: 3
Triglycerides: 85 mg/dL (ref 0.0–149.0)
VLDL: 17 mg/dL (ref 0.0–40.0)

## 2011-09-30 LAB — BASIC METABOLIC PANEL
BUN: 14 mg/dL (ref 6–23)
CO2: 29 mEq/L (ref 19–32)
Calcium: 9.9 mg/dL (ref 8.4–10.5)
Chloride: 103 mEq/L (ref 96–112)
Creatinine, Ser: 0.8 mg/dL (ref 0.4–1.2)
GFR: 76.27 mL/min (ref >60.00)
Glucose, Bld: 103 mg/dL — ABNORMAL HIGH (ref 70–99)
Potassium: 4.2 mEq/L (ref 3.5–5.1)
Sodium: 141 mEq/L (ref 135–145)

## 2011-09-30 LAB — HEPATIC FUNCTION PANEL
ALT: 15 U/L (ref 0–35)
AST: 19 U/L (ref 0–37)
Albumin: 4.2 g/dL (ref 3.5–5.2)
Alkaline Phosphatase: 36 U/L — ABNORMAL LOW (ref 39–117)
Bilirubin, Direct: 0 mg/dL (ref 0.0–0.3)
Total Bilirubin: 0.6 mg/dL (ref 0.3–1.2)
Total Protein: 7.8 g/dL (ref 6.0–8.3)

## 2011-09-30 LAB — TSH: TSH: 2.59 u[IU]/mL (ref 0.35–5.50)

## 2011-09-30 MED ORDER — AMMONIUM LACTATE 12 % EX CREA: TOPICAL_CREAM | CUTANEOUS | Status: AC | PRN

## 2011-09-30 NOTE — Patient Instructions (Signed)

## 2011-09-30 NOTE — Progress Notes (Signed)
Subjective:    [name] [name] is a 77 y.o. female who presents for Medicare Annual/Subsequent preventive examination.  Preventive Screening-Counseling & Management  Tobacco History  Smoking status  . Former Smoker  . Quit date: 09/29/1985  Smokeless tobacco  . Never Used     Problems Prior to Visit 1.   Current Problems (verified) Patient Active Problem List  Diagnoses  . COLONIC POLYPS  . HYPOTHYROIDISM  . BIPOLAR DISORDER UNSPECIFIED  . HEMORRHOIDS, EXTERNAL  . SINUSITIS- ACUTE-NOS  . ACUTE PHARYNGITIS  . DEVIATED NASAL SEPTUM  . HERNIA, VENTRAL  . IBS  . UTI  . HEMATURIA UNSPECIFIED  . URINARY INCONTINENCE, STRESS, FEMALE  . ECZEMA  . PSORIASIS  . OSTEOARTHRITIS  . OSTEOPOROSIS  . INSOMNIA  . NAUSEA  . DYSURIA  . ABDOMINAL PAIN OTHER SPECIFIED SITE    Medications Prior to Visit Current Outpatient Prescriptions on File Prior to Visit  Medication Sig Dispense Refill  . alendronate (FOSAMAX) 70 MG tablet Take 70 mg by mouth every 7 (seven) days. Take with a full glass of water on an empty stomach.       . ARIPiprazole (ABILIFY) 2 MG tablet Take 4 mg by mouth daily.        . calcium carbonate (OS-CAL) 600 MG TABS Take 600 mg by mouth 2 (two) times daily with a meal.        . cholecalciferol (VITAMIN D) 1000 UNITS tablet Take 1,000 Units by mouth daily.        . fish oil-omega-3 fatty acids 1000 MG capsule Take 1 g by mouth daily.        Marland Kitchen levothyroxine (SYNTHROID) 75 MCG tablet Take 1 tablet (75 mcg total) by mouth daily.  90 tablet  0  . Lutein 40 MG CAPS Take 1 capsule by mouth daily.        . multivitamin (THERAGRAN) per tablet Take 1 tablet by mouth daily.        Marland Kitchen zolpidem (AMBIEN) 10 MG tablet Take 10 mg by mouth at bedtime as needed.          Current Medications (verified) Current Outpatient Prescriptions  Medication Sig Dispense Refill  . alendronate (FOSAMAX) 70 MG tablet Take 70 mg by mouth every 7 (seven) days. Take with a full glass of water on  an empty stomach.       . ARIPiprazole (ABILIFY) 2 MG tablet Take 4 mg by mouth daily.        . calcium carbonate (OS-CAL) 600 MG TABS Take 600 mg by mouth 2 (two) times daily with a meal.        . cholecalciferol (VITAMIN D) 1000 UNITS tablet Take 1,000 Units by mouth daily.        . fish oil-omega-3 fatty acids 1000 MG capsule Take 1 g by mouth daily.        Marland Kitchen levothyroxine (SYNTHROID) 75 MCG tablet Take 1 tablet (75 mcg total) by mouth daily.  90 tablet  0  . Lutein 40 MG CAPS Take 1 capsule by mouth daily.        . multivitamin (THERAGRAN) per tablet Take 1 tablet by mouth daily.        Marland Kitchen zolpidem (AMBIEN) 10 MG tablet Take 10 mg by mouth at bedtime as needed.           Allergies (verified) Bupropion hcl   PAST HISTORY  Family History Family History  Problem Relation Age of Onset  . Colon cancer Brother   .  Lung cancer Brother   . Lung cancer Father   . Prostate cancer Father   . Arthritis Father   . Cancer Father     lung, prostate  . Heart disease Brother   . Aortic aneurysm Brother   . Cancer Brother     lung  . Cancer Other     colon    Social History History  Substance Use Topics  . Smoking status: Former Smoker    Quit date: 09/29/1985  . Smokeless tobacco: Never Used  . Alcohol Use: No     Are there smokers in your home (other than you)? No  Risk Factors Current exercise habits: Exercise is limited by orthopedic condition(s): knee surgery.  Dietary issues discussed: na   Cardiac risk factors: advanced age (older than 74 for men, 95 for women) and hypertension.  Depression Screen (Note: if answer to either of the following is "Yes", a more complete depression screening is indicated)   Over the past two weeks, have you felt down, depressed or hopeless? Yes--depressed over being confined in her house  Over the past two weeks, have you felt little interest or pleasure in doing things? No  Have you lost interest or pleasure in daily life? No  Do you often  feel hopeless? No  Do you cry easily over simple problems? No  Activities of Daily Living In your present state of health, do you have any difficulty performing the following activities?:  Driving? No Managing money?  No Feeding yourself? No Getting from bed to chair? No Climbing a flight of stairs? No Preparing food and eating?: No Bathing or showering? No Getting dressed: No Getting to the toilet? No Using the toilet:No Moving around from place to place: No In the past year have you fallen or had a near fall?:No   Are you sexually active?  No  Do you have more than one partner?  No  Hearing Difficulties: No Do you often ask people to speak up or repeat themselves? No Do you experience ringing or noises in your ears? No Do you have difficulty understanding soft or whispered voices? No   Do you feel that you have a problem with memory? No  Do you often misplace items? No  Do you feel safe at home? yes Cognitive Testing  Alert? Yes  Normal Appearance?Yes  Oriented to person? Yes  Place? Yes   Time? Yes  Recall of three objects?  Yes  Can perform simple calculations? Yes  Displays appropriate judgment?Yes  Can read the correct time from a watch face?Yes   Advanced Directives have been discussed with the patient? Yes  List the Names of Other Physician/Practitioners you currently use: 1.  optho-- [facility]  Indicate any recent Medical Services you may have received from other than [facility] providers in the past year (date may be approximate).  Immunization History  Administered Date(s) Administered  . Influenza Whole 08/09/2007, 07/08/2010  . Pneumococcal Polysaccharide 08/08/2002  . Td 10/18/1998  . Zoster 09/28/2006    Screening Tests Health Maintenance  Topic Date Due  . Tetanus/tdap  10/18/2008  . Mammogram  09/30/2011  . Influenza Vaccine  07/18/2012  . Colonoscopy  01/30/2013  . Pneumococcal Polysaccharide Vaccine Age 55 And Over  Completed  . Zostavax  Completed     All answers were reviewed with the patient and necessary referrals were made:  Loreen Freud, DO   09/30/2011   History reviewed: allergies, current medications, past family history, past medical history, past social  history, past surgical history and problem list  Review of Systems  Review of Systems  Constitutional: Negative for activity change, appetite change and fatigue.  HENT: Negative for hearing loss, congestion, tinnitus and ear discharge.   Eyes: Negative for visual disturbance (see optho q1y -- vision corrected to 20/20 with glasses).  Respiratory: Negative for cough, chest tightness and shortness of breath.   Cardiovascular: Negative for chest pain, palpitations and leg swelling.  Gastrointestinal: Negative for abdominal pain, diarrhea, constipation and abdominal distention.  Genitourinary: Negative for urgency, frequency, decreased urine volume and difficulty urinating.  Musculoskeletal: Negative for back pain, arthralgias and gait problem.  Skin: Negative for color change, pallor and rash.  Neurological: Negative for dizziness, light-headedness, numbness and headaches.  Hematological: Negative for adenopathy. Does not bruise/bleed easily.  Psychiatric/Behavioral: Negative for suicidal ideas, confusion, sleep disturbance, self-injury, dysphoric mood, decreased concentration and agitation.  Pt is able to read and write and can do all ADLs No risk for falling No abuse/ violence in home     Objective:     Vision by Snellen chart: optho  Body mass index is 22.79 kg/(m^2). BP 134/80  Pulse 94  Temp(Src) 98.4 F (36.9 C) (Oral)  Ht 5\' 6"  (1.676 m)  Wt 141 lb 3.2 oz (64.048 kg)  BMI 22.79 kg/m2  SpO2 96%  BP 134/80  Pulse 94  Temp(Src) 98.4 F (36.9 C) (Oral)  Ht 5\' 6"  (1.676 m)  Wt 141 lb 3.2 oz (64.048 kg)  BMI 22.79 kg/m2  SpO2 96% General appearance: alert, cooperative, appears stated age and no distress Head: Normocephalic, without obvious  abnormality, atraumatic Eyes: conjunctivae/corneas clear. PERRL, EOM's intact. Fundi benign. Ears: normal TM's and external ear canals both ears Nose: Nares normal. Septum midline. Mucosa normal. No drainage or sinus tenderness. Throat: lips, mucosa, and tongue normal; teeth and gums normal Neck: no adenopathy, no carotid bruit, no JVD, supple, symmetrical, trachea midline and thyroid not enlarged, symmetric, no tenderness/mass/nodules Back: symmetric, no curvature. ROM normal. No CVA tenderness. Lungs: clear to auscultation bilaterally Breasts: normal appearance, no masses or tenderness Heart: regular rate and rhythm, S1, S2 normal, no murmur, click, rub or gallop Abdomen: soft, non-tender; bowel sounds normal; no masses,  no organomegaly Pelvic: not indicated; post-menopausal, no abnormal Pap smears in past Extremities: extremities normal, atraumatic, no cyanosis or edema Pulses: 2+ and symmetric Skin: Skin color, texture, turgor normal. No rashes or lesions----very dry Lymph nodes: Cervical, supraclavicular, and axillary nodes normal. Neurologic: Alert and oriented X 3, normal strength and tone. Normal symmetric reflexes. Normal coordination and gait Psych--no depression, anxiety     Assessment:    cpe Osteoporosis---check bmd Hypothyroidism---check labs     Plan:     During the course of the visit the patient was educated and counseled about appropriate screening and preventive services including:    Pneumococcal vaccine   Influenza vaccine  Td vaccine  Screening mammography  Screening Pap smear and pelvic exam   Bone densitometry screening  Colorectal cancer screening  Advanced directives: has an advanced directive - a copy HAS NOT been provided.  Diet review for nutrition referral? Yes ____  Not Indicated ___x_   Patient Instructions (the written plan) was given to the patient.  Medicare Attestation I have personally reviewed: The patient's medical and  social history Their use of alcohol, tobacco or illicit drugs Their current medications and supplements The patient's functional ability including ADLs,fall risks, home safety risks, cognitive, and hearing and visual impairment Diet and physical activities  Evidence for depression or mood disorders  The patient's weight, height, BMI, and visual acuity have been recorded in the chart.  I have made referrals, counseling, and provided education to the patient based on review of the above and I have provided the patient with a written personalized care plan for preventive services.     Loreen Freud, DO   09/30/2011

## 2011-10-04 DIAGNOSIS — Z4889 Encounter for other specified surgical aftercare: Secondary | ICD-10-CM | POA: Diagnosis not present

## 2011-10-04 DIAGNOSIS — IMO0001 Reserved for inherently not codable concepts without codable children: Secondary | ICD-10-CM | POA: Diagnosis not present

## 2011-10-06 DIAGNOSIS — IMO0001 Reserved for inherently not codable concepts without codable children: Secondary | ICD-10-CM | POA: Diagnosis not present

## 2011-10-06 DIAGNOSIS — Z4889 Encounter for other specified surgical aftercare: Secondary | ICD-10-CM | POA: Diagnosis not present

## 2011-10-11 DIAGNOSIS — Z4889 Encounter for other specified surgical aftercare: Secondary | ICD-10-CM | POA: Diagnosis not present

## 2011-10-11 DIAGNOSIS — IMO0001 Reserved for inherently not codable concepts without codable children: Secondary | ICD-10-CM | POA: Diagnosis not present

## 2011-10-13 DIAGNOSIS — Z4889 Encounter for other specified surgical aftercare: Secondary | ICD-10-CM | POA: Diagnosis not present

## 2011-10-13 DIAGNOSIS — IMO0001 Reserved for inherently not codable concepts without codable children: Secondary | ICD-10-CM | POA: Diagnosis not present

## 2011-10-18 DIAGNOSIS — IMO0001 Reserved for inherently not codable concepts without codable children: Secondary | ICD-10-CM | POA: Diagnosis not present

## 2011-10-18 DIAGNOSIS — Z4889 Encounter for other specified surgical aftercare: Secondary | ICD-10-CM | POA: Diagnosis not present

## 2011-10-22 DIAGNOSIS — Z4889 Encounter for other specified surgical aftercare: Secondary | ICD-10-CM | POA: Diagnosis not present

## 2011-10-22 DIAGNOSIS — IMO0001 Reserved for inherently not codable concepts without codable children: Secondary | ICD-10-CM | POA: Diagnosis not present

## 2011-10-25 DIAGNOSIS — Z4889 Encounter for other specified surgical aftercare: Secondary | ICD-10-CM | POA: Diagnosis not present

## 2011-10-25 DIAGNOSIS — IMO0001 Reserved for inherently not codable concepts without codable children: Secondary | ICD-10-CM | POA: Diagnosis not present

## 2011-10-27 DIAGNOSIS — IMO0001 Reserved for inherently not codable concepts without codable children: Secondary | ICD-10-CM | POA: Diagnosis not present

## 2011-10-27 DIAGNOSIS — Z4889 Encounter for other specified surgical aftercare: Secondary | ICD-10-CM | POA: Diagnosis not present

## 2011-11-18 DIAGNOSIS — F319 Bipolar disorder, unspecified: Secondary | ICD-10-CM | POA: Diagnosis not present

## 2011-11-19 ENCOUNTER — Telehealth: Payer: Self-pay | Admitting: Family Medicine

## 2011-11-19 MED ORDER — LEVOTHYROXINE SODIUM 75 MCG PO TABS: 75.0000 ug | ORAL_TABLET | Freq: Every day | ORAL | Status: DC

## 2011-11-19 NOTE — Telephone Encounter (Signed)
Rx faxed.    KP 

## 2011-11-19 NOTE — Telephone Encounter (Signed)
Refill- levothyroxine tablet. Take one tablet by mouth daily. Qty 90 last fill 11.5.12

## 2011-12-06 DIAGNOSIS — Z1231 Encounter for screening mammogram for malignant neoplasm of breast: Secondary | ICD-10-CM | POA: Diagnosis not present

## 2011-12-09 DIAGNOSIS — B351 Tinea unguium: Secondary | ICD-10-CM | POA: Diagnosis not present

## 2011-12-09 DIAGNOSIS — M79609 Pain in unspecified limb: Secondary | ICD-10-CM | POA: Diagnosis not present

## 2011-12-22 ENCOUNTER — Encounter: Payer: Self-pay | Admitting: Family Medicine

## 2012-02-24 DIAGNOSIS — H61009 Unspecified perichondritis of external ear, unspecified ear: Secondary | ICD-10-CM | POA: Diagnosis not present

## 2012-02-24 DIAGNOSIS — L570 Actinic keratosis: Secondary | ICD-10-CM | POA: Diagnosis not present

## 2012-02-29 DIAGNOSIS — B351 Tinea unguium: Secondary | ICD-10-CM | POA: Diagnosis not present

## 2012-02-29 DIAGNOSIS — M79609 Pain in unspecified limb: Secondary | ICD-10-CM | POA: Diagnosis not present

## 2012-05-04 DIAGNOSIS — M79609 Pain in unspecified limb: Secondary | ICD-10-CM | POA: Diagnosis not present

## 2012-05-04 DIAGNOSIS — B351 Tinea unguium: Secondary | ICD-10-CM | POA: Diagnosis not present

## 2012-05-10 DIAGNOSIS — F319 Bipolar disorder, unspecified: Secondary | ICD-10-CM | POA: Diagnosis not present

## 2012-06-16 ENCOUNTER — Ambulatory Visit (INDEPENDENT_AMBULATORY_CARE_PROVIDER_SITE_OTHER): Payer: Medicare Other | Admitting: Family Medicine

## 2012-06-16 ENCOUNTER — Encounter: Payer: Self-pay | Admitting: Family Medicine

## 2012-06-16 VITALS — BP 130/80 | HR 80 | Temp 98.3°F | Wt 149.2 lb

## 2012-06-16 DIAGNOSIS — K219 Gastro-esophageal reflux disease without esophagitis: Secondary | ICD-10-CM | POA: Diagnosis not present

## 2012-06-16 DIAGNOSIS — R197 Diarrhea, unspecified: Secondary | ICD-10-CM

## 2012-06-16 LAB — CBC WITH DIFFERENTIAL/PLATELET
Basophils Absolute: 0 10*3/uL (ref 0.0–0.1)
Basophils Relative: 0 % (ref 0–1)
Eosinophils Absolute: 0.2 10*3/uL (ref 0.0–0.7)
Eosinophils Relative: 3 % (ref 0–5)
HCT: 39.4 % (ref 36.0–46.0)
Hemoglobin: 13.8 g/dL (ref 12.0–15.0)
Lymphocytes Relative: 30 % (ref 12–46)
Lymphs Abs: 2 10*3/uL (ref 0.7–4.0)
MCH: 31.6 pg (ref 26.0–34.0)
MCHC: 35 g/dL (ref 30.0–36.0)
MCV: 90.2 fL (ref 78.0–100.0)
Monocytes Absolute: 0.7 10*3/uL (ref 0.1–1.0)
Monocytes Relative: 11 % (ref 3–12)
Neutro Abs: 3.8 10*3/uL (ref 1.7–7.7)
Neutrophils Relative %: 56 % (ref 43–77)
Platelets: 290 10*3/uL (ref 150–400)
RBC: 4.37 MIL/uL (ref 3.87–5.11)
RDW: 13.6 % (ref 11.5–15.5)
WBC: 6.7 10*3/uL (ref 4.0–10.5)

## 2012-06-16 LAB — BASIC METABOLIC PANEL
BUN: 13 mg/dL (ref 6–23)
CO2: 27 mEq/L (ref 19–32)
Calcium: 9.6 mg/dL (ref 8.4–10.5)
Chloride: 105 mEq/L (ref 96–112)
Creat: 0.73 mg/dL (ref 0.50–1.10)
Glucose, Bld: 98 mg/dL (ref 70–99)
Potassium: 4.1 mEq/L (ref 3.5–5.3)
Sodium: 140 mEq/L (ref 135–145)

## 2012-06-16 LAB — HEPATIC FUNCTION PANEL
ALT: 13 U/L (ref 0–35)
AST: 17 U/L (ref 0–37)
Albumin: 4.2 g/dL (ref 3.5–5.2)
Alkaline Phosphatase: 32 U/L — ABNORMAL LOW (ref 39–117)
Bilirubin, Direct: 0.1 mg/dL (ref 0.0–0.3)
Indirect Bilirubin: 0.3 mg/dL (ref 0.0–0.9)
Total Bilirubin: 0.4 mg/dL (ref 0.3–1.2)
Total Protein: 7.2 g/dL (ref 6.0–8.3)

## 2012-06-16 LAB — TSH: TSH: 3.582 u[IU]/mL (ref 0.350–4.500)

## 2012-06-16 MED ORDER — HYOSCYAMINE SULFATE 0.125 MG SL SUBL: 0.1250 mg | SUBLINGUAL_TABLET | SUBLINGUAL | Status: DC | PRN

## 2012-06-16 MED ORDER — OMEPRAZOLE MAGNESIUM 20 MG PO TBEC: 20.0000 mg | DELAYED_RELEASE_TABLET | Freq: Every day | ORAL | Status: DC

## 2012-06-16 NOTE — Progress Notes (Signed)
  Subjective:     [name] [name] is a 78 y.o. female who presents for evaluation of diarrhea. Onset of diarrhea was a few weeks ago. Diarrhea is occurring approximately 3 times per day. Patient describes diarrhea as watery. Diarrhea has been associated with abdominal pain described as cramping. Patient denies blood in stool, fever, illness in household contacts, recent antibiotic use, recent camping, recent travel, significant abdominal pain, unintentional weight loss. Previous visits for diarrhea: none. Evaluation to date: none.  Treatment to date: pepcid complete because she was having heartburn as well.  The following portions of the patient's history were reviewed and updated as appropriate: allergies, current medications, past family history, past medical history, past social history, past surgical history and problem list.  Review of Systems Pertinent items are noted in HPI.    Objective:    BP 130/80  Pulse 80  Temp 98.3 F (36.8 C) (Oral)  Wt 149 lb 3.2 oz (67.677 kg)  SpO2 96% General: alert, cooperative, appears stated age and no distress  Hydration:  well hydrated  Abdomen:    normal findings: soft and abnormal findings:  mild tenderness in the epigastrium    Assessment:    Diarrhea of uncertain etiology, mild in severity ---? IBS  Plan:    Appropriate educational material discussed and distributed. Follow up as needed. Lab studies per orders. Medications per orders. OTC antidiarrheals may be used judiciously.

## 2012-06-16 NOTE — Patient Instructions (Signed)
Diarrhea Infections caused by germs (bacterial) or a virus commonly cause diarrhea. Your caregiver has determined that with time, rest and fluids, the diarrhea should improve. In general, eat normally while drinking more water than usual. Although water may prevent dehydration, it does not contain salt and minerals (electrolytes). Broths, weak tea without caffeine and oral rehydration solutions (ORS) replace fluids and electrolytes. Small amounts of fluids should be taken frequently. Large amounts at one time may not be tolerated. Plain water may be harmful in infants and the elderly. Oral rehydrating solutions (ORS) are available at pharmacies and grocery stores. ORS replace water and important electrolytes in proper proportions. Sports drinks are not as effective as ORS and may be harmful due to sugars worsening diarrhea.  ORS is especially recommended for use in children with diarrhea. As a general guideline for children, replace any new fluid losses from diarrhea and/or vomiting with ORS as follows:   If your child weighs 22 pounds or under (10 kg or less), give 60-120 mL ( -  cup or 2 - 4 ounces) of ORS for each episode of diarrheal stool or vomiting episode.   If your child weighs more than 22 pounds (more than 10 kgs), give 120-240 mL ( - 1 cup or 4 - 8 ounces) of ORS for each diarrheal stool or episode of vomiting.   While correcting for dehydration, children should eat normally. However, foods high in sugar should be avoided because this may worsen diarrhea. Large amounts of carbonated soft drinks, juice, gelatin desserts and other highly sugared drinks should be avoided.   After correction of dehydration, other liquids that are appealing to the child may be added. Children should drink small amounts of fluids frequently and fluids should be increased as tolerated. Children should drink enough fluids to keep urine clear or pale yellow.   Adults should eat normally while drinking more fluids  than usual. Drink small amounts of fluids frequently and increase as tolerated. Drink enough fluids to keep urine clear or pale yellow. Broths, weak decaffeinated tea, lemon lime soft drinks (allowed to go flat) and ORS replace fluids and electrolytes.   Avoid:   Carbonated drinks.   Juice.   Extremely hot or cold fluids.   Caffeine drinks.   Fatty, greasy foods.   Alcohol.   Tobacco.   Too much intake of anything at one time.   Gelatin desserts.   Probiotics are active cultures of beneficial bacteria. They may lessen the amount and number of diarrheal stools in adults. Probiotics can be found in yogurt with active cultures and in supplements.   Wash hands well to avoid spreading bacteria and virus.   Anti-diarrheal medications are not recommended for infants and children.   Only take over-the-counter or prescription medicines for pain, discomfort or fever as directed by your caregiver. Do not give aspirin to children because it may cause Reye's Syndrome.   For adults, ask your caregiver if you should continue all prescribed and over-the-counter medicines.   If your caregiver has given you a follow-up appointment, it is very important to keep that appointment. Not keeping the appointment could result in a chronic or permanent injury, and disability. If there is any problem keeping the appointment, you must call back to this facility for assistance.  SEEK IMMEDIATE MEDICAL CARE IF:   You or your child is unable to keep fluids down or other symptoms or problems become worse in spite of treatment.   Vomiting or diarrhea develops and becomes persistent.     There is vomiting of blood or bile (green material).   There is blood in the stool or the stools are black and tarry.   There is no urine output in 6-8 hours or there is only a small amount of very dark urine.   Abdominal pain develops, increases or localizes.   You have a fever.   Your baby is older than 3 months with a  rectal temperature of 102 F (38.9 C) or higher.   Your baby is 3 months old or younger with a rectal temperature of 100.4 F (38 C) or higher.   You or your child develops excessive weakness, dizziness, fainting or extreme thirst.   You or your child develops a rash, stiff neck, severe headache or become irritable or sleepy and difficult to awaken.  MAKE SURE YOU:   Understand these instructions.   Will watch your condition.   Will get help right away if you are not doing well or get worse.  Document Released: 09/24/2002 Document Revised: 09/23/2011 Document Reviewed: 08/11/2009 [employer] Patient Information 2012 [employer]. 

## 2012-06-16 NOTE — Assessment & Plan Note (Signed)
Check labs 

## 2012-06-20 LAB — H. PYLORI ANTIBODY, IGG: H Pylori IgG: <0.4 {ISR}

## 2012-07-10 DIAGNOSIS — M216X9 Other acquired deformities of unspecified foot: Secondary | ICD-10-CM | POA: Diagnosis not present

## 2012-11-08 DIAGNOSIS — F319 Bipolar disorder, unspecified: Secondary | ICD-10-CM | POA: Diagnosis not present

## 2012-11-16 DIAGNOSIS — M79609 Pain in unspecified limb: Secondary | ICD-10-CM | POA: Diagnosis not present

## 2012-11-16 DIAGNOSIS — B351 Tinea unguium: Secondary | ICD-10-CM | POA: Diagnosis not present

## 2013-01-26 ENCOUNTER — Encounter: Payer: Self-pay | Admitting: Gastroenterology

## 2013-02-01 ENCOUNTER — Encounter: Payer: Self-pay | Admitting: Gastroenterology

## 2013-02-12 ENCOUNTER — Other Ambulatory Visit: Payer: Self-pay | Admitting: Family Medicine

## 2013-03-01 DIAGNOSIS — B351 Tinea unguium: Secondary | ICD-10-CM | POA: Diagnosis not present

## 2013-03-01 DIAGNOSIS — M79609 Pain in unspecified limb: Secondary | ICD-10-CM | POA: Diagnosis not present

## 2013-03-02 ENCOUNTER — Telehealth: Payer: Self-pay | Admitting: *Deleted

## 2013-03-02 NOTE — Telephone Encounter (Signed)
Good pick up.  It was a insertion time, must have been pretty challenging and so I think MAC is a good idea this time.  Thanks

## 2013-03-02 NOTE — Telephone Encounter (Signed)
Please review pt. Last colon in 2009.  Your note says tortuous colon.  Do you want her to have propofol?  Thanks.

## 2013-03-02 NOTE — Telephone Encounter (Signed)
Spoke with pt. And changed her colon to a propofol day./Dr. Christella Hartigan

## 2013-03-05 ENCOUNTER — Ambulatory Visit (AMBULATORY_SURGERY_CENTER): Payer: Medicare Other | Admitting: *Deleted

## 2013-03-05 VITALS — Ht 67.0 in | Wt 158.0 lb

## 2013-03-05 DIAGNOSIS — Z1211 Encounter for screening for malignant neoplasm of colon: Secondary | ICD-10-CM

## 2013-03-05 MED ORDER — MOVIPREP 100 G PO SOLR: ORAL | Status: DC

## 2013-03-05 NOTE — Progress Notes (Signed)
Pt. Told to be on clear liquids an extra day Monday 04/02/13 as per last colonoscopy instructions.  Pt. Did not have access to computer so was not given emmi.

## 2013-03-27 ENCOUNTER — Other Ambulatory Visit: Payer: Medicare Other | Admitting: Gastroenterology

## 2013-04-04 ENCOUNTER — Ambulatory Visit (AMBULATORY_SURGERY_CENTER): Payer: Medicare Other | Admitting: Gastroenterology

## 2013-04-04 ENCOUNTER — Encounter: Payer: Self-pay | Admitting: Gastroenterology

## 2013-04-04 VITALS — BP 120/96 | HR 76 | Temp 97.1°F | Resp 25 | Ht 67.0 in | Wt 158.0 lb

## 2013-04-04 DIAGNOSIS — Z1211 Encounter for screening for malignant neoplasm of colon: Secondary | ICD-10-CM | POA: Diagnosis not present

## 2013-04-04 DIAGNOSIS — F319 Bipolar disorder, unspecified: Secondary | ICD-10-CM | POA: Diagnosis not present

## 2013-04-04 DIAGNOSIS — E039 Hypothyroidism, unspecified: Secondary | ICD-10-CM | POA: Diagnosis not present

## 2013-04-04 DIAGNOSIS — K5730 Diverticulosis of large intestine without perforation or abscess without bleeding: Secondary | ICD-10-CM

## 2013-04-04 DIAGNOSIS — D126 Benign neoplasm of colon, unspecified: Secondary | ICD-10-CM

## 2013-04-04 DIAGNOSIS — Z86010 Personal history of colonic polyps: Secondary | ICD-10-CM | POA: Diagnosis not present

## 2013-04-04 MED ORDER — SODIUM CHLORIDE 0.9 % IV SOLN: 500.0000 mL | INTRAVENOUS | Status: DC

## 2013-04-04 NOTE — Op Note (Signed)
[facility] [address] [place], [address]   COLONOSCOPY PROCEDURE REPORT  PATIENT: [name], [name]  MR#: [id number] BIRTHDATE: 08-03-1933 , 79  yrs. old GENDER: Female ENDOSCOPIST: Rachael Fee, MD PROCEDURE DATE:  04/04/2013 PROCEDURE:   Colonoscopy with snare polypectomy ASA CLASS:   Class II INDICATIONS:Two subcentimeter adenomas removed 2009. MEDICATIONS: MAC sedation, administered by CRNA and propofol (Diprivan) 200mg  IV  DESCRIPTION OF PROCEDURE:   After the risks benefits and alternatives of the procedure were thoroughly explained, informed consent was obtained.  A digital rectal exam revealed no abnormalities of the rectum.   The LB [id number]-[id number]  endoscope was introduced through the anus and advanced to the cecum, which was identified by both the appendix and ileocecal valve. No adverse events experienced.   The quality of the prep was good.  The instrument was then slowly withdrawn as the colon was fully examined.   COLON FINDINGS: Four small polyps were found, removed and sent to pathology.  These were all sessile; 2-49mm across; removed with cold snare; located in ascending and sigmoid segments.  There were a few small diverticulum in left colon.  The examination was otherwise normal.  Retroflexed views revealed no abnormalities. The time to cecum=9 minutes 00 seconds.  Withdrawal time=11 minutes 12 seconds. The scope was withdrawn and the procedure completed. COMPLICATIONS: There were no complications.  ENDOSCOPIC IMPRESSION: Four small polyps were found, removed and sent to pathology. There were a few small diverticulum in left colon. The examination was otherwise normal.  RECOMMENDATIONS: 1.  Given your age, you will not need another colonoscopy for colon cancer screening or polyp surveillance.  These types of tests usually stop around the age 71. 2.  You will receive a letter within 1-2 weeks with the results of your biopsy as  well as final recommendations.  Please call my office if you have not received a letter after 3 weeks.   eSigned:  Rachael Fee, MD 04/04/2013 10:42 AM   cc: Loreen Freud, MD

## 2013-04-04 NOTE — Patient Instructions (Addendum)
YOU HAD AN ENDOSCOPIC PROCEDURE TODAY AT THE [facility]: Refer to the procedure report that was given to you for any specific questions about what was found during the examination.  If the procedure report does not answer your questions, please call your gastroenterologist to clarify.  If you requested that your care partner not be given the details of your procedure findings, then the procedure report has been included in a sealed envelope for you to review at your convenience later.  YOU SHOULD EXPECT: Some feelings of bloating in the abdomen. Passage of more gas than usual.  Walking can help get rid of the air that was put into your GI tract during the procedure and reduce the bloating. If you had a lower endoscopy (such as a colonoscopy or flexible sigmoidoscopy) you may notice spotting of blood in your stool or on the toilet paper. If you underwent a bowel prep for your procedure, then you may not have a normal bowel movement for a few days.  DIET: Your first meal following the procedure should be a light meal and then it is ok to progress to your normal diet.  A half-sandwich or bowl of soup is an example of a good first meal.  Heavy or fried foods are harder to digest and may make you feel nauseous or bloated.  Likewise meals heavy in dairy and vegetables can cause extra gas to form and this can also increase the bloating.  Drink plenty of fluids but you should avoid alcoholic beverages for 24 hours.  ACTIVITY: Your care partner should take you home directly after the procedure.  You should plan to take it easy, moving slowly for the rest of the day.  You can resume normal activity the day after the procedure however you should NOT DRIVE or use heavy machinery for 24 hours (because of the sedation medicines used during the test).    SYMPTOMS TO REPORT IMMEDIATELY: A gastroenterologist can be reached at any hour.  During normal business hours, 8:30 AM to 5:00 PM Monday through Friday,  call [phone].  After hours and on weekends, please call the GI answering service at [phone] who will take a message and have the physician on call contact you.   Following lower endoscopy (colonoscopy or flexible sigmoidoscopy):  Excessive amounts of blood in the stool  Significant tenderness or worsening of abdominal pains  Swelling of the abdomen that is new, acute  Fever of 100F or higher    FOLLOW UP: If any biopsies were taken you will be contacted by phone or by letter within the next 1-3 weeks.  Call your gastroenterologist if you have not heard about the biopsies in 3 weeks.  Our staff will call the home number listed on your records the next business day following your procedure to check on you and address any questions or concerns that you may have at that time regarding the information given to you following your procedure. This is a courtesy call and so if there is no answer at the home number and we have not heard from you through the emergency physician on call, we will assume that you have returned to your regular daily activities without incident.  SIGNATURES/CONFIDENTIALITY: You and/or your care partner have signed paperwork which will be entered into your electronic medical record.  These signatures attest to the fact that that the information above on your After Visit Summary has been reviewed and is understood.  Full responsibility of the confidentiality   of this discharge information lies with you and/or your care-partner.  Polyp and diverticulosis information given. 

## 2013-04-04 NOTE — Progress Notes (Signed)
Called to room to assist during endoscopic procedure.  Patient ID and intended procedure confirmed with present staff. Received instructions for my participation in the procedure from the performing physician.  

## 2013-04-04 NOTE — Progress Notes (Signed)
Patient did not experience any of the following events: a burn prior to discharge; a fall within the facility; wrong site/side/patient/procedure/implant event; or a hospital transfer or hospital admission upon discharge from the facility. (G8907) Patient did not have preoperative order for IV antibiotic SSI prophylaxis. (G8918)  

## 2013-04-04 NOTE — Progress Notes (Signed)
Lidocaine-40mg IV prior to Propofol InductionPropofol given over incremental dosages 

## 2013-04-05 ENCOUNTER — Telehealth: Payer: Self-pay | Admitting: *Deleted

## 2013-04-05 NOTE — Telephone Encounter (Signed)
  Follow up Call-  Call back number 04/04/2013  Post procedure Call Back phone  # [phone]  Permission to leave phone message Yes     Patient questions:  Do you have a fever, pain , or abdominal swelling? no Pain Score  0 *  Have you tolerated food without any problems? yes  Have you been able to return to your normal activities? yes  Do you have any questions about your discharge instructions: Diet   no Medications  no Follow up visit  no  Do you have questions or concerns about your Care? no  Actions: * If pain score is 4 or above: No action needed, pain <4.

## 2013-04-09 ENCOUNTER — Encounter: Payer: Self-pay | Admitting: Gastroenterology

## 2013-05-07 DIAGNOSIS — M79609 Pain in unspecified limb: Secondary | ICD-10-CM | POA: Diagnosis not present

## 2013-05-07 DIAGNOSIS — B351 Tinea unguium: Secondary | ICD-10-CM | POA: Diagnosis not present

## 2013-05-09 ENCOUNTER — Other Ambulatory Visit: Payer: Self-pay | Admitting: Family Medicine

## 2013-05-09 DIAGNOSIS — F319 Bipolar disorder, unspecified: Secondary | ICD-10-CM | POA: Diagnosis not present

## 2013-07-26 DIAGNOSIS — B351 Tinea unguium: Secondary | ICD-10-CM | POA: Diagnosis not present

## 2013-07-26 DIAGNOSIS — M79609 Pain in unspecified limb: Secondary | ICD-10-CM | POA: Diagnosis not present

## 2013-08-09 DIAGNOSIS — H902 Conductive hearing loss, unspecified: Secondary | ICD-10-CM | POA: Diagnosis not present

## 2013-08-09 DIAGNOSIS — H6120 Impacted cerumen, unspecified ear: Secondary | ICD-10-CM | POA: Diagnosis not present

## 2013-08-09 DIAGNOSIS — M1710 Unilateral primary osteoarthritis, unspecified knee: Secondary | ICD-10-CM | POA: Diagnosis not present

## 2013-08-11 ENCOUNTER — Other Ambulatory Visit: Payer: Self-pay | Admitting: Family Medicine

## 2013-08-29 ENCOUNTER — Other Ambulatory Visit: Payer: Self-pay | Admitting: Family Medicine

## 2013-08-29 DIAGNOSIS — M1710 Unilateral primary osteoarthritis, unspecified knee: Secondary | ICD-10-CM | POA: Diagnosis not present

## 2013-09-05 ENCOUNTER — Telehealth: Payer: Self-pay

## 2013-09-05 NOTE — Telephone Encounter (Signed)
Medication and allergies: reviewed and updated  90 day supply/mail order: na Local pharmacy: [facility]   Immunizations due:  tdap  A/P:   No changes to FH or PSH Tdap---2000 PNA--09/2002 Shingles--09/2006 Flu--06/2013 MMG--11/2011--neg CCS--03/2013 (see note below from Dr Christella Hartigan) At least one of the polyps removed during your recent procedure was proven to be adenomatous.  Usually, routine screening/surveillance colonoscopy would be recommended to be done in 3-5 years.  However since colon cancer screening tests generally stop between ages 50 and 75, I will leave it to you and your primary care physician to contact my office at that time if it is felt that colon cancer screening is still an important issue for you.  To Discuss with Provider: PNA vaccine

## 2013-09-06 ENCOUNTER — Ambulatory Visit (INDEPENDENT_AMBULATORY_CARE_PROVIDER_SITE_OTHER): Payer: Medicare Other | Admitting: Family Medicine

## 2013-09-06 ENCOUNTER — Encounter: Payer: Self-pay | Admitting: Family Medicine

## 2013-09-06 VITALS — BP 114/80 | HR 87 | Temp 98.5°F | Ht 65.0 in | Wt 155.0 lb

## 2013-09-06 DIAGNOSIS — E2839 Other primary ovarian failure: Secondary | ICD-10-CM

## 2013-09-06 DIAGNOSIS — Z23 Encounter for immunization: Secondary | ICD-10-CM | POA: Diagnosis not present

## 2013-09-06 DIAGNOSIS — L259 Unspecified contact dermatitis, unspecified cause: Secondary | ICD-10-CM | POA: Diagnosis not present

## 2013-09-06 DIAGNOSIS — Z1231 Encounter for screening mammogram for malignant neoplasm of breast: Secondary | ICD-10-CM

## 2013-09-06 DIAGNOSIS — L309 Dermatitis, unspecified: Secondary | ICD-10-CM

## 2013-09-06 DIAGNOSIS — Z136 Encounter for screening for cardiovascular disorders: Secondary | ICD-10-CM | POA: Diagnosis not present

## 2013-09-06 DIAGNOSIS — Z0000 Encounter for general adult medical examination without abnormal findings: Secondary | ICD-10-CM

## 2013-09-06 DIAGNOSIS — M810 Age-related osteoporosis without current pathological fracture: Secondary | ICD-10-CM

## 2013-09-06 DIAGNOSIS — M1710 Unilateral primary osteoarthritis, unspecified knee: Secondary | ICD-10-CM | POA: Diagnosis not present

## 2013-09-06 DIAGNOSIS — E039 Hypothyroidism, unspecified: Secondary | ICD-10-CM | POA: Diagnosis not present

## 2013-09-06 LAB — CBC WITH DIFFERENTIAL/PLATELET
Basophils Absolute: 0.1 10*3/uL (ref 0.0–0.1)
Basophils Relative: 0.8 % (ref 0.0–3.0)
Eosinophils Absolute: 0.2 10*3/uL (ref 0.0–0.7)
Eosinophils Relative: 3.1 % (ref 0.0–5.0)
HCT: 41 % (ref 36.0–46.0)
Hemoglobin: 13.9 g/dL (ref 12.0–15.0)
Lymphocytes Relative: 34.3 % (ref 12.0–46.0)
Lymphs Abs: 2.2 10*3/uL (ref 0.7–4.0)
MCHC: 33.8 g/dL (ref 30.0–36.0)
MCV: 92.3 fl (ref 78.0–100.0)
Monocytes Absolute: 0.6 10*3/uL (ref 0.1–1.0)
Monocytes Relative: 8.9 % (ref 3.0–12.0)
Neutro Abs: 3.4 10*3/uL (ref 1.4–7.7)
Neutrophils Relative %: 52.9 % (ref 43.0–77.0)
Platelets: 270 10*3/uL (ref 150.0–400.0)
RBC: 4.44 Mil/uL (ref 3.87–5.11)
RDW: 14.2 % (ref 11.5–14.6)
WBC: 6.5 10*3/uL (ref 4.5–10.5)

## 2013-09-06 LAB — HEPATIC FUNCTION PANEL
ALT: 19 U/L (ref 0–35)
AST: 20 U/L (ref 0–37)
Albumin: 4.2 g/dL (ref 3.5–5.2)
Alkaline Phosphatase: 37 U/L — ABNORMAL LOW (ref 39–117)
Bilirubin, Direct: 0.1 mg/dL (ref 0.0–0.3)
Total Bilirubin: 0.9 mg/dL (ref 0.3–1.2)
Total Protein: 7.8 g/dL (ref 6.0–8.3)

## 2013-09-06 LAB — BASIC METABOLIC PANEL
BUN: 16 mg/dL (ref 6–23)
CO2: 26 mEq/L (ref 19–32)
Calcium: 9.6 mg/dL (ref 8.4–10.5)
Chloride: 105 mEq/L (ref 96–112)
Creatinine, Ser: 0.9 mg/dL (ref 0.4–1.2)
GFR: 64.33 mL/min (ref >60.00)
Glucose, Bld: 89 mg/dL (ref 70–99)
Potassium: 3.9 mEq/L (ref 3.5–5.1)
Sodium: 140 mEq/L (ref 135–145)

## 2013-09-06 LAB — TSH: TSH: 2.32 u[IU]/mL (ref 0.35–5.50)

## 2013-09-06 LAB — LIPID PANEL
Cholesterol: 212 mg/dL — ABNORMAL HIGH (ref 0–200)
HDL: 56.9 mg/dL (ref >39.00)
Total CHOL/HDL Ratio: 4
Triglycerides: 69 mg/dL (ref 0.0–149.0)
VLDL: 13.8 mg/dL (ref 0.0–40.0)

## 2013-09-06 LAB — LDL CHOLESTEROL, DIRECT: Direct LDL: 150.7 mg/dL

## 2013-09-06 MED ORDER — TETANUS-DIPHTH-ACELL PERTUSSIS 5-2.5-18.5 LF-MCG/0.5 IM SUSP: 0.5000 mL | Freq: Once | INTRAMUSCULAR | Status: DC

## 2013-09-06 MED ORDER — AMMONIUM LACTATE 12 % EX CREA: TOPICAL_CREAM | CUTANEOUS | Status: DC | PRN

## 2013-09-06 NOTE — Assessment & Plan Note (Signed)
Refill lac hydrin

## 2013-09-06 NOTE — Patient Instructions (Signed)
Preventive Care for Adults, Female A healthy lifestyle and preventive care can promote health and wellness. Preventive health guidelines for women include the following key practices.  A routine yearly physical is a good way to check with your caregiver about your health and preventive screening. It is a chance to share any concerns and updates on your health, and to receive a thorough exam.  Visit your dentist for a routine exam and preventive care every 6 months. Brush your teeth twice a day and floss once a day. Good oral hygiene prevents tooth decay and gum disease.  The frequency of eye exams is based on your age, health, family medical history, use of contact lenses, and other factors. Follow your caregiver's recommendations for frequency of eye exams.  Eat a healthy diet. Foods like vegetables, fruits, whole grains, low-fat dairy products, and lean protein foods contain the nutrients you need without too many calories. Decrease your intake of foods high in solid fats, added sugars, and salt. Eat the right amount of calories for you.Get information about a proper diet from your caregiver, if necessary.  Regular physical exercise is one of the most important things you can do for your health. Most adults should get at least 150 minutes of moderate-intensity exercise (any activity that increases your heart rate and causes you to sweat) each week. In addition, most adults need muscle-strengthening exercises on 2 or more days a week.  Maintain a healthy weight. The body mass index (BMI) is a screening tool to identify possible weight problems. It provides an estimate of body fat based on height and weight. Your caregiver can help determine your BMI, and can help you achieve or maintain a healthy weight.For adults 20 years and older:  A BMI below 18.5 is considered underweight.  A BMI of 18.5 to 24.9 is normal.  A BMI of 25 to 29.9 is considered overweight.  A BMI of 30 and above is  considered obese.  Maintain normal blood lipids and cholesterol levels by exercising and minimizing your intake of saturated fat. Eat a balanced diet with plenty of fruit and vegetables. Blood tests for lipids and cholesterol should begin at age 20 and be repeated every 5 years. If your lipid or cholesterol levels are high, you are over 50, or you are at high risk for heart disease, you may need your cholesterol levels checked more frequently.Ongoing high lipid and cholesterol levels should be treated with medicines if diet and exercise are not effective.  If you smoke, find out from your caregiver how to quit. If you do not use tobacco, do not start.  Lung cancer screening is recommended for adults aged 55 80 years who are at high risk for developing lung cancer because of a history of smoking. Yearly low-dose computed tomography (CT) is recommended for people who have at least a 30-pack-year history of smoking and are a current smoker or have quit within the past 15 years. A pack year of smoking is smoking an average of 1 pack of cigarettes a day for 1 year (for example: 1 pack a day for 30 years or 2 packs a day for 15 years). Yearly screening should continue until the smoker has stopped smoking for at least 15 years. Yearly screening should also be stopped for people who develop a health problem that would prevent them from having lung cancer treatment.  If you are pregnant, do not drink alcohol. If you are breastfeeding, be very cautious about drinking alcohol. If you are   not pregnant and choose to drink alcohol, do not exceed 1 drink per day. One drink is considered to be 12 ounces (355 mL) of beer, 5 ounces (148 mL) of wine, or 1.5 ounces (44 mL) of liquor.  Avoid use of street drugs. Do not share needles with anyone. Ask for help if you need support or instructions about stopping the use of drugs.  High blood pressure causes heart disease and increases the risk of stroke. Your blood pressure  should be checked at least every 1 to 2 years. Ongoing high blood pressure should be treated with medicines if weight loss and exercise are not effective.  If you are 55 to 77 years old, ask your caregiver if you should take aspirin to prevent strokes.  Diabetes screening involves taking a blood sample to check your fasting blood sugar level. This should be done once every 3 years, after age 45, if you are within normal weight and without risk factors for diabetes. Testing should be considered at a younger age or be carried out more frequently if you are overweight and have at least 1 risk factor for diabetes.  Breast cancer screening is essential preventive care for women. You should practice "breast self-awareness." This means understanding the normal appearance and feel of your breasts and may include breast self-examination. Any changes detected, no matter how small, should be reported to a caregiver. Women in their 20s and 30s should have a clinical breast exam (CBE) by a caregiver as part of a regular health exam every 1 to 3 years. After age 40, women should have a CBE every year. Starting at age 40, women should consider having a mammography (breast X-ray test) every year. Women who have a family history of breast cancer should talk to their caregiver about genetic screening. Women at a high risk of breast cancer should talk to their caregivers about having magnetic resonance imaging (MRI) and a mammography every year.  Breast cancer gene (BRCA)-related cancer risk assessment is recommended for women who have family members with BRCA-related cancers. BRCA-related cancers include breast, ovarian, tubal, and peritoneal cancers. Having family members with these cancers may be associated with an increased risk for harmful changes (mutations) in the breast cancer genes BRCA1 and BRCA2. Results of the assessment will determine the need for genetic counseling and BRCA1 and BRCA2 testing.  The Pap test is  a screening test for cervical cancer. A Pap test can show cell changes on the cervix that might become cervical cancer if left untreated. A Pap test is a procedure in which cells are obtained and examined from the lower end of the uterus (cervix).  Women should have a Pap test starting at age 21.  Between ages 21 and 29, Pap tests should be repeated every 2 years.  Beginning at age 30, you should have a Pap test every 3 years as long as the past 3 Pap tests have been normal.  Some women have medical problems that increase the chance of getting cervical cancer. Talk to your caregiver about these problems. It is especially important to talk to your caregiver if a new problem develops soon after your last Pap test. In these cases, your caregiver may recommend more frequent screening and Pap tests.  The above recommendations are the same for women who have or have not gotten the vaccine for human papillomavirus (HPV).  If you had a hysterectomy for a problem that was not cancer or a condition that could lead to cancer, then   you no longer need Pap tests. Even if you no longer need a Pap test, a regular exam is a good idea to make sure no other problems are starting.  If you are between ages 65 and 70, and you have had normal Pap tests going back 10 years, you no longer need Pap tests. Even if you no longer need a Pap test, a regular exam is a good idea to make sure no other problems are starting.  If you have had past treatment for cervical cancer or a condition that could lead to cancer, you need Pap tests and screening for cancer for at least 20 years after your treatment.  If Pap tests have been discontinued, risk factors (such as a new sexual partner) need to be reassessed to determine if screening should be resumed.  The HPV test is an additional test that may be used for cervical cancer screening. The HPV test looks for the virus that can cause the cell changes on the cervix. The cells collected  during the Pap test can be tested for HPV. The HPV test could be used to screen women aged 30 years and older, and should be used in women of any age who have unclear Pap test results. After the age of 30, women should have HPV testing at the same frequency as a Pap test.  Colorectal cancer can be detected and often prevented. Most routine colorectal cancer screening begins at the age of 50 and continues through age 75. However, your caregiver may recommend screening at an earlier age if you have risk factors for colon cancer. On a yearly basis, your caregiver may provide home test kits to check for hidden blood in the stool. Use of a small camera at the end of a tube, to directly examine the colon (sigmoidoscopy or colonoscopy), can detect the earliest forms of colorectal cancer. Talk to your caregiver about this at age 50, when routine screening begins. Direct examination of the colon should be repeated every 5 to 10 years through age 75, unless early forms of pre-cancerous polyps or small growths are found.  Hepatitis C blood testing is recommended for all people born from 1945 through 1965 and any individual with known risks for hepatitis C.  Practice safe sex. Use condoms and avoid high-risk sexual practices to reduce the spread of sexually transmitted infections (STIs). STIs include gonorrhea, chlamydia, syphilis, trichomonas, herpes, HPV, and human immunodeficiency virus (HIV). Herpes, HIV, and HPV are viral illnesses that have no cure. They can result in disability, cancer, and death. Sexually active women aged 25 and younger should be checked for chlamydia. Older women with new or multiple partners should also be tested for chlamydia. Testing for other STIs is recommended if you are sexually active and at increased risk.  Osteoporosis is a disease in which the bones lose minerals and strength with aging. This can result in serious bone fractures. The risk of osteoporosis can be identified using a  bone density scan. Women ages 65 and over and women at risk for fractures or osteoporosis should discuss screening with their caregivers. Ask your caregiver whether you should take a calcium supplement or vitamin D to reduce the rate of osteoporosis.  Menopause can be associated with physical symptoms and risks. Hormone replacement therapy is available to decrease symptoms and risks. You should talk to your caregiver about whether hormone replacement therapy is right for you.  Use sunscreen. Apply sunscreen liberally and repeatedly throughout the day. You should seek shade   when your shadow is shorter than you. Protect yourself by wearing long sleeves, pants, a wide-brimmed hat, and sunglasses year round, whenever you are outdoors.  Once a month, do a whole body skin exam, using a mirror to look at the skin on your back. Notify your caregiver of new moles, moles that have irregular borders, moles that are larger than a pencil eraser, or moles that have changed in shape or color.  Stay current with required immunizations.  Influenza vaccine. All adults should be immunized every year.  Tetanus, diphtheria, and acellular pertussis (Td, Tdap) vaccine. Pregnant women should receive 1 dose of Tdap vaccine during each pregnancy. The dose should be obtained regardless of the length of time since the last dose. Immunization is preferred during the 27th to 36th week of gestation. An adult who has not previously received Tdap or who does not know her vaccine status should receive 1 dose of Tdap. This initial dose should be followed by tetanus and diphtheria toxoids (Td) booster doses every 10 years. Adults with an unknown or incomplete history of completing a 3-dose immunization series with Td-containing vaccines should begin or complete a primary immunization series including a Tdap dose. Adults should receive a Td booster every 10 years.  Varicella vaccine. An adult without evidence of immunity to varicella  should receive 2 doses or a second dose if she has previously received 1 dose. Pregnant females who do not have evidence of immunity should receive the first dose after pregnancy. This first dose should be obtained before leaving the health care facility. The second dose should be obtained 4 8 weeks after the first dose.  Human papillomavirus (HPV) vaccine. Females aged 13 26 years who have not received the vaccine previously should obtain the 3-dose series. The vaccine is not recommended for use in pregnant females. However, pregnancy testing is not needed before receiving a dose. If a female is found to be pregnant after receiving a dose, no treatment is needed. In that case, the remaining doses should be delayed until after the pregnancy. Immunization is recommended for any person with an immunocompromised condition through the age of 26 years if she did not get any or all doses earlier. During the 3-dose series, the second dose should be obtained 4 8 weeks after the first dose. The third dose should be obtained 24 weeks after the first dose and 16 weeks after the second dose.  Zoster vaccine. One dose is recommended for adults aged 60 years or older unless certain conditions are present.  Measles, mumps, and rubella (MMR) vaccine. Adults born before 1957 generally are considered immune to measles and mumps. Adults born in 1957 or later should have 1 or more doses of MMR vaccine unless there is a contraindication to the vaccine or there is laboratory evidence of immunity to each of the three diseases. A routine second dose of MMR vaccine should be obtained at least 28 days after the first dose for students attending postsecondary schools, health care workers, or international travelers. People who received inactivated measles vaccine or an unknown type of measles vaccine during 1963 1967 should receive 2 doses of MMR vaccine. People who received inactivated mumps vaccine or an unknown type of mumps vaccine  before 1979 and are at high risk for mumps infection should consider immunization with 2 doses of MMR vaccine. For females of childbearing age, rubella immunity should be determined. If there is no evidence of immunity, females who are not pregnant should be vaccinated. If there   is no evidence of immunity, females who are pregnant should delay immunization until after pregnancy. Unvaccinated health care workers born before 1957 who lack laboratory evidence of measles, mumps, or rubella immunity or laboratory confirmation of disease should consider measles and mumps immunization with 2 doses of MMR vaccine or rubella immunization with 1 dose of MMR vaccine.  Pneumococcal 13-valent conjugate (PCV13) vaccine. When indicated, a person who is uncertain of her immunization history and has no record of immunization should receive the PCV13 vaccine. An adult aged 19 years or older who has certain medical conditions and has not been previously immunized should receive 1 dose of PCV13 vaccine. This PCV13 should be followed with a dose of pneumococcal polysaccharide (PPSV23) vaccine. The PPSV23 vaccine dose should be obtained at least 8 weeks after the dose of PCV13 vaccine. An adult aged 19 years or older who has certain medical conditions and previously received 1 or more doses of PPSV23 vaccine should receive 1 dose of PCV13. The PCV13 vaccine dose should be obtained 1 or more years after the last PPSV23 vaccine dose.  Pneumococcal polysaccharide (PPSV23) vaccine. When PCV13 is also indicated, PCV13 should be obtained first. All adults aged 65 years and older should be immunized. An adult younger than age 65 years who has certain medical conditions should be immunized. Any person who resides in a nursing home or long-term care facility should be immunized. An adult smoker should be immunized. People with an immunocompromised condition and certain other conditions should receive both PCV13 and PPSV23 vaccines. People  with human immunodeficiency virus (HIV) infection should be immunized as soon as possible after diagnosis. Immunization during chemotherapy or radiation therapy should be avoided. Routine use of PPSV23 vaccine is not recommended for American Indians, [place] Natives, or people younger than 65 years unless there are medical conditions that require PPSV23 vaccine. When indicated, people who have unknown immunization and have no record of immunization should receive PPSV23 vaccine. One-time revaccination 5 years after the first dose of PPSV23 is recommended for people aged 19 64 years who have chronic kidney failure, nephrotic syndrome, asplenia, or immunocompromised conditions. People who received 1 2 doses of PPSV23 before age 65 years should receive another dose of PPSV23 vaccine at age 65 years or later if at least 5 years have passed since the previous dose. Doses of PPSV23 are not needed for people immunized with PPSV23 at or after age 65 years.  Meningococcal vaccine. Adults with asplenia or persistent complement component deficiencies should receive 2 doses of quadrivalent meningococcal conjugate (MenACWY-D) vaccine. The doses should be obtained at least 2 months apart. Microbiologists working with certain meningococcal bacteria, military recruits, people at risk during an outbreak, and people who travel to or live in countries with a high rate of meningitis should be immunized. A first-year college student up through age 21 years who is living in a residence hall should receive a dose if she did not receive a dose on or after her 16th birthday. Adults who have certain high-risk conditions should receive one or more doses of vaccine.  Hepatitis A vaccine. Adults who wish to be protected from this disease, have certain high-risk conditions, work with hepatitis A-infected animals, work in hepatitis A research labs, or travel to or work in countries with a high rate of hepatitis A should be immunized. Adults  who were previously unvaccinated and who anticipate close contact with an international adoptee during the first 60 days after arrival in the [place] from a country   with a high rate of hepatitis A should be immunized.  Hepatitis B vaccine. Adults who wish to be protected from this disease, have certain high-risk conditions, may be exposed to blood or other infectious body fluids, are household contacts or sex partners of hepatitis B positive people, are clients or workers in certain care facilities, or travel to or work in countries with a high rate of hepatitis B should be immunized.  Haemophilus influenzae type b (Hib) vaccine. A previously unvaccinated person with asplenia or sickle cell disease or having a scheduled splenectomy should receive 1 dose of Hib vaccine. Regardless of previous immunization, a recipient of a hematopoietic stem cell transplant should receive a 3-dose series 6 12 months after her successful transplant. Hib vaccine is not recommended for adults with HIV infection. Preventive Services / Frequency Ages 19 to 39  Blood pressure check.** / Every 1 to 2 years.  Lipid and cholesterol check.** / Every 5 years beginning at age 20.  Clinical breast exam.** / Every 3 years for women in their 20s and 30s.  BRCA-related cancer risk assessment.** / For women who have family members with a BRCA-related cancer (breast, ovarian, tubal, or peritoneal cancers).  Pap test.** / Every 2 years from ages 21 through 29. Every 3 years starting at age 30 through age 65 or 70 with a history of 3 consecutive normal Pap tests.  HPV screening.** / Every 3 years from ages 30 through ages 65 to 70 with a history of 3 consecutive normal Pap tests.  Hepatitis C blood test.** / For any individual with known risks for hepatitis C.  Skin self-exam. / Monthly.  Influenza vaccine. / Every year.  Tetanus, diphtheria, and acellular pertussis (Tdap, Td) vaccine.** / Consult your caregiver. Pregnant  women should receive 1 dose of Tdap vaccine during each pregnancy. 1 dose of Td every 10 years.  Varicella vaccine.** / Consult your caregiver. Pregnant females who do not have evidence of immunity should receive the first dose after pregnancy.  HPV vaccine. / 3 doses over 6 months, if 26 and younger. The vaccine is not recommended for use in pregnant females. However, pregnancy testing is not needed before receiving a dose.  Measles, mumps, rubella (MMR) vaccine.** / You need at least 1 dose of MMR if you were born in 1957 or later. You may also need a 2nd dose. For females of childbearing age, rubella immunity should be determined. If there is no evidence of immunity, females who are not pregnant should be vaccinated. If there is no evidence of immunity, females who are pregnant should delay immunization until after pregnancy.  Pneumococcal 13-valent conjugate (PCV13) vaccine.** / Consult your caregiver.  Pneumococcal polysaccharide (PPSV23) vaccine.** / 1 to 2 doses if you smoke cigarettes or if you have certain conditions.  Meningococcal vaccine.** / 1 dose if you are age 19 to 21 years and a first-year college student living in a residence hall, or have one of several medical conditions, you need to get vaccinated against meningococcal disease. You may also need additional booster doses.  Hepatitis A vaccine.** / Consult your caregiver.  Hepatitis B vaccine.** / Consult your caregiver.  Haemophilus influenzae type b (Hib) vaccine.** / Consult your caregiver. Ages 40 to 64  Blood pressure check.** / Every 1 to 2 years.  Lipid and cholesterol check.** / Every 5 years beginning at age 20.  Lung cancer screening. / Every year if you are aged 55 80 years and have a 30-pack-year history of smoking and   currently smoke or have quit within the past 15 years. Yearly screening is stopped once you have quit smoking for at least 15 years or develop a health problem that would prevent you from having  lung cancer treatment.  Clinical breast exam.** / Every year after age 40.  BRCA-related cancer risk assessment.** / For women who have family members with a BRCA-related cancer (breast, ovarian, tubal, or peritoneal cancers).  Mammogram.** / Every year beginning at age 40 and continuing for as long as you are in good health. Consult with your caregiver.  Pap test.** / Every 3 years starting at age 30 through age 65 or 70 with a history of 3 consecutive normal Pap tests.  HPV screening.** / Every 3 years from ages 30 through ages 65 to 70 with a history of 3 consecutive normal Pap tests.  Fecal occult blood test (FOBT) of stool. / Every year beginning at age 50 and continuing until age 75. You may not need to do this test if you get a colonoscopy every 10 years.  Flexible sigmoidoscopy or colonoscopy.** / Every 5 years for a flexible sigmoidoscopy or every 10 years for a colonoscopy beginning at age 50 and continuing until age 75.  Hepatitis C blood test.** / For all people born from 1945 through 1965 and any individual with known risks for hepatitis C.  Skin self-exam. / Monthly.  Influenza vaccine. / Every year.  Tetanus, diphtheria, and acellular pertussis (Tdap/Td) vaccine.** / Consult your caregiver. Pregnant women should receive 1 dose of Tdap vaccine during each pregnancy. 1 dose of Td every 10 years.  Varicella vaccine.** / Consult your caregiver. Pregnant females who do not have evidence of immunity should receive the first dose after pregnancy.  Zoster vaccine.** / 1 dose for adults aged 60 years or older.  Measles, mumps, rubella (MMR) vaccine.** / You need at least 1 dose of MMR if you were born in 1957 or later. You may also need a 2nd dose. For females of childbearing age, rubella immunity should be determined. If there is no evidence of immunity, females who are not pregnant should be vaccinated. If there is no evidence of immunity, females who are pregnant should delay  immunization until after pregnancy.  Pneumococcal 13-valent conjugate (PCV13) vaccine.** / Consult your caregiver.  Pneumococcal polysaccharide (PPSV23) vaccine.** / 1 to 2 doses if you smoke cigarettes or if you have certain conditions.  Meningococcal vaccine.** / Consult your caregiver.  Hepatitis A vaccine.** / Consult your caregiver.  Hepatitis B vaccine.** / Consult your caregiver.  Haemophilus influenzae type b (Hib) vaccine.** / Consult your caregiver. Ages 65 and over  Blood pressure check.** / Every 1 to 2 years.  Lipid and cholesterol check.** / Every 5 years beginning at age 20.  Lung cancer screening. / Every year if you are aged 55 80 years and have a 30-pack-year history of smoking and currently smoke or have quit within the past 15 years. Yearly screening is stopped once you have quit smoking for at least 15 years or develop a health problem that would prevent you from having lung cancer treatment.  Clinical breast exam.** / Every year after age 40.  BRCA-related cancer risk assessment.** / For women who have family members with a BRCA-related cancer (breast, ovarian, tubal, or peritoneal cancers).  Mammogram.** / Every year beginning at age 40 and continuing for as long as you are in good health. Consult with your caregiver.  Pap test.** / Every 3 years starting at age   30 through age 65 or 70 with a 3 consecutive normal Pap tests. Testing can be stopped between 65 and 70 with 3 consecutive normal Pap tests and no abnormal Pap or HPV tests in the past 10 years.  HPV screening.** / Every 3 years from ages 30 through ages 65 or 70 with a history of 3 consecutive normal Pap tests. Testing can be stopped between 65 and 70 with 3 consecutive normal Pap tests and no abnormal Pap or HPV tests in the past 10 years.  Fecal occult blood test (FOBT) of stool. / Every year beginning at age 50 and continuing until age 75. You may not need to do this test if you get a colonoscopy  every 10 years.  Flexible sigmoidoscopy or colonoscopy.** / Every 5 years for a flexible sigmoidoscopy or every 10 years for a colonoscopy beginning at age 50 and continuing until age 75.  Hepatitis C blood test.** / For all people born from 1945 through 1965 and any individual with known risks for hepatitis C.  Osteoporosis screening.** / A one-time screening for women ages 65 and over and women at risk for fractures or osteoporosis.  Skin self-exam. / Monthly.  Influenza vaccine. / Every year.  Tetanus, diphtheria, and acellular pertussis (Tdap/Td) vaccine.** / 1 dose of Td every 10 years.  Varicella vaccine.** / Consult your caregiver.  Zoster vaccine.** / 1 dose for adults aged 60 years or older.  Pneumococcal 13-valent conjugate (PCV13) vaccine.** / Consult your caregiver.  Pneumococcal polysaccharide (PPSV23) vaccine.** / 1 dose for all adults aged 65 years and older.  Meningococcal vaccine.** / Consult your caregiver.  Hepatitis A vaccine.** / Consult your caregiver.  Hepatitis B vaccine.** / Consult your caregiver.  Haemophilus influenzae type b (Hib) vaccine.** / Consult your caregiver. ** Family history and personal history of risk and conditions may change your caregiver's recommendations. Document Released: 11/30/2001 Document Revised: 01/29/2013 Document Reviewed: 03/01/2011 [employer] Patient Information 2014 [employer].  

## 2013-09-06 NOTE — Progress Notes (Signed)
Subjective:    [name] [name] is a 79 y.o. female who presents for Medicare Annual/Subsequent preventive examination.  Preventive Screening-Counseling & Management  Tobacco History  Smoking status  . Former Smoker  . Quit date: 09/29/1985  Smokeless tobacco  . Never Used     Problems Prior to Visit 1.   Current Problems (verified) Patient Active Problem List   Diagnosis Date Noted  . Diarrhea 06/16/2012  . IBS 02/23/2010  . HEMORRHOIDS, EXTERNAL 01/01/2010  . HEMATURIA UNSPECIFIED 11/04/2009  . NAUSEA 06/02/2009  . ABDOMINAL PAIN OTHER SPECIFIED SITE 06/02/2009  . URINARY INCONTINENCE, STRESS, FEMALE 05/09/2009  . DYSURIA 05/09/2009  . SINUSITIS- ACUTE-NOS 11/25/2008  . ACUTE PHARYNGITIS 11/25/2008  . UTI 04/23/2008  . COLONIC POLYPS 12/21/2007  . BIPOLAR DISORDER UNSPECIFIED 12/21/2007  . DEVIATED NASAL SEPTUM 12/21/2007  . HERNIA, VENTRAL 12/21/2007  . OSTEOARTHRITIS 12/21/2007  . HYPOTHYROIDISM 09/01/2007  . ECZEMA 09/01/2007  . PSORIASIS 09/01/2007  . OSTEOPOROSIS 09/01/2007  . INSOMNIA 09/01/2007    Medications Prior to Visit Current Outpatient Prescriptions on File Prior to Visit  Medication Sig Dispense Refill  . ARIPiprazole (ABILIFY) 2 MG tablet Take 5 mg by mouth daily.       Marland Kitchen b complex vitamins tablet Take 2 tablets by mouth daily.      . calcium carbonate (OS-CAL) 600 MG TABS Take 600 mg by mouth 2 (two) times daily with a meal.        . cholecalciferol (VITAMIN D) 1000 UNITS tablet Take 2 Units by mouth daily.       . Cyanocobalamin (B-12) 2500 MCG TABS Take 1 tablet by mouth daily.      . fish oil-omega-3 fatty acids 1000 MG capsule Take 1 g by mouth daily.        . Glucosamine 500 MG TABS Take 2 tablets by mouth daily.      Marland Kitchen levothyroxine (SYNTHROID, LEVOTHROID) 75 MCG tablet TAKE 1 TABLET BY MOUTH DAILY **LABS AND OFFICE VISITI DUE NOW**  14 tablet  0  . Lutein 40 MG CAPS Take 1 capsule by mouth daily.        . magnesium oxide (MAG-OX) 400  MG tablet Take 800 mg by mouth daily.      . multivitamin (THERAGRAN) per tablet Take 1 tablet by mouth daily.        Marland Kitchen zolpidem (AMBIEN) 10 MG tablet Take 10 mg by mouth at bedtime as needed.         No current facility-administered medications on file prior to visit.    Current Medications (verified) Current Outpatient Prescriptions  Medication Sig Dispense Refill  . ARIPiprazole (ABILIFY) 2 MG tablet Take 5 mg by mouth daily.       Marland Kitchen b complex vitamins tablet Take 2 tablets by mouth daily.      . calcium carbonate (OS-CAL) 600 MG TABS Take 600 mg by mouth 2 (two) times daily with a meal.        . cholecalciferol (VITAMIN D) 1000 UNITS tablet Take 2 Units by mouth daily.       . Cyanocobalamin (B-12) 2500 MCG TABS Take 1 tablet by mouth daily.      . fish oil-omega-3 fatty acids 1000 MG capsule Take 1 g by mouth daily.        . Glucosamine 500 MG TABS Take 2 tablets by mouth daily.      Marland Kitchen levothyroxine (SYNTHROID, LEVOTHROID) 75 MCG tablet TAKE 1 TABLET BY MOUTH DAILY **LABS AND OFFICE  VISITI DUE NOW**  14 tablet  0  . Lutein 40 MG CAPS Take 1 capsule by mouth daily.        . magnesium oxide (MAG-OX) 400 MG tablet Take 800 mg by mouth daily.      . multivitamin (THERAGRAN) per tablet Take 1 tablet by mouth daily.        . Sodium Hyaluronate (EUFLEXXA) 20 MG/2ML SOSY Inject 20 mg into the articular space every 21 ( twenty-one) days.      Marland Kitchen zolpidem (AMBIEN) 10 MG tablet Take 10 mg by mouth at bedtime as needed.        Marland Kitchen ammonium lactate (LAC-HYDRIN) 12 % cream Apply topically as needed for dry skin.  385 g  0  . TDaP (BOOSTRIX) 5-2.5-18.5 LF-MCG/0.5 injection Inject 0.5 mLs into the muscle once.  0.5 mL  0   No current facility-administered medications for this visit.     Allergies (verified) Bupropion hcl   PAST HISTORY  Family History Family History  Problem Relation Age of Onset  . Colon cancer Brother   . Lung cancer Brother   . Lung cancer Father   . Prostate cancer  Father   . Arthritis Father   . Cancer Father     lung, prostate  . Heart disease Brother   . Aortic aneurysm Brother   . Cancer Brother     lung  . Cancer Other     colon    Social History History  Substance Use Topics  . Smoking status: Former Smoker    Quit date: 09/29/1985  . Smokeless tobacco: Never Used  . Alcohol Use: No     Are there smokers in your home (other than you)? No  Risk Factors Current exercise habits: Gym/ health club routine includes yoga and walking.  Dietary issues discussed: na   Cardiac risk factors: advanced age (older than 33 for men, 66 for women).  Depression Screen (Note: if answer to either of the following is "Yes", a more complete depression screening is indicated)   Over the past two weeks, have you felt down, depressed or hopeless? No  Over the past two weeks, have you felt little interest or pleasure in doing things? No  Have you lost interest or pleasure in daily life? No  Do you often feel hopeless? No  Do you cry easily over simple problems? No  Activities of Daily Living In your present state of health, do you have any difficulty performing the following activities?:  Driving? No Managing money?  No Feeding yourself? No Getting from bed to chair? No Climbing a flight of stairs? No Preparing food and eating?: No Bathing or showering? No Getting dressed: No Getting to the toilet? No Using the toilet:No Moving around from place to place: No In the past year have you fallen or had a near fall?:No   Are you sexually active?  No  Do you have more than one partner?  No  Hearing Difficulties: No Do you often ask people to speak up or repeat themselves? No Do you experience ringing or noises in your ears? No Do you have difficulty understanding soft or whispered voices? No   Do you feel that you have a problem with memory? No  Do you often misplace items? No  Do you feel safe at home?  Yes  Cognitive Testing  Alert? Yes   Normal Appearance?Yes  Oriented to person? Yes  Place? Yes   Time? Yes  Recall of three objects?  Yes  Can perform simple calculations? Yes  Displays appropriate judgment?Yes  Can read the correct time from a watch face?Yes   Advanced Directives have been discussed with the patient? Yes  List the Names of Other Physician/Practitioners you currently use: 1.    Indicate any recent Medical Services you may have received from other than [facility] providers in the past year (date may be approximate).  Immunization History  Administered Date(s) Administered  . Influenza Whole 08/09/2007, 07/08/2010  . Influenza,inj,Quad PF,36+ Mos 07/02/2013  . Pneumococcal Polysaccharide 08/08/2002  . Td 10/18/1998  . Zoster 09/28/2006    Screening Tests Health Maintenance  Topic Date Due  . Tetanus/tdap  10/18/2008  . Mammogram  12/05/2012  . Influenza Vaccine  05/18/2014  . Colonoscopy  04/04/2018  . Pneumococcal Polysaccharide Vaccine Age 73 And Over  Completed  . Zostavax  Completed    All answers were reviewed with the patient and necessary referrals were made:  Loreen Freud, DO   09/06/2013   History reviewed:  She  has a past medical history of Hypothyroidism; Insomnia; Psoriasis; Eczema; Osteoporosis; Bipolar disorder; Osteoarthritis; and Depression. She  does not have any pertinent problems on file. She  has past surgical history that includes Appendectomy (2003); Abdominal hysterectomy (1982); Tonsillectomy (1970's); Ventral hernia repair (2005); Breast lumpectomy (1947); Cataract extraction (2010); Knee arthroscopy (09/07/11); and Nasal septum surgery (1970's). Her family history includes Aortic aneurysm in her brother; Arthritis in her father; Cancer in her brother, father, and other; Colon cancer in her brother; Heart disease in her brother; Lung cancer in her brother and father; Prostate cancer in her father. She  reports that she quit smoking about 27 years ago. She has never used  smokeless tobacco. She reports that she does not drink alcohol or use illicit drugs. She has a current medication list which includes the following prescription(s): aripiprazole, b complex vitamins, calcium carbonate, cholecalciferol, b-12, fish oil-omega-3 fatty acids, glucosamine, levothyroxine, lutein, magnesium oxide, multivitamin, sodium hyaluronate, zolpidem, ammonium lactate, and tdap. Current Outpatient Prescriptions on File Prior to Visit  Medication Sig Dispense Refill  . ARIPiprazole (ABILIFY) 2 MG tablet Take 5 mg by mouth daily.       Marland Kitchen b complex vitamins tablet Take 2 tablets by mouth daily.      . calcium carbonate (OS-CAL) 600 MG TABS Take 600 mg by mouth 2 (two) times daily with a meal.        . cholecalciferol (VITAMIN D) 1000 UNITS tablet Take 2 Units by mouth daily.       . Cyanocobalamin (B-12) 2500 MCG TABS Take 1 tablet by mouth daily.      . fish oil-omega-3 fatty acids 1000 MG capsule Take 1 g by mouth daily.        . Glucosamine 500 MG TABS Take 2 tablets by mouth daily.      Marland Kitchen levothyroxine (SYNTHROID, LEVOTHROID) 75 MCG tablet TAKE 1 TABLET BY MOUTH DAILY **LABS AND OFFICE VISITI DUE NOW**  14 tablet  0  . Lutein 40 MG CAPS Take 1 capsule by mouth daily.        . magnesium oxide (MAG-OX) 400 MG tablet Take 800 mg by mouth daily.      . multivitamin (THERAGRAN) per tablet Take 1 tablet by mouth daily.        Marland Kitchen zolpidem (AMBIEN) 10 MG tablet Take 10 mg by mouth at bedtime as needed.         No current facility-administered medications on file prior  to visit.   She is allergic to bupropion hcl.  Review of Systems  Review of Systems  Constitutional: Negative for activity change, appetite change and fatigue.  HENT: Negative for hearing loss, congestion, tinnitus and ear discharge.   Eyes: Negative for visual disturbance (see optho q1y -- vision corrected to 20/20 with glasses).  Respiratory: Negative for cough, chest tightness and shortness of breath.    Cardiovascular: Negative for chest pain, palpitations and leg swelling.  Gastrointestinal: Negative for abdominal pain, diarrhea, constipation and abdominal distention.  Genitourinary: Negative for urgency, frequency, decreased urine volume and difficulty urinating.  Musculoskeletal: Negative for back pain, arthralgias and gait problem.  Skin: Negative for color change, pallor and rash.  Neurological: Negative for dizziness, light-headedness, numbness and headaches.  Hematological: Negative for adenopathy. Does not bruise/bleed easily.  Psychiatric/Behavioral: Negative for suicidal ideas, confusion, sleep disturbance, self-injury, dysphoric mood, decreased concentration and agitation.  Pt is able to read and write and can do all ADLs No risk for falling No abuse/ violence in home    Objective:     Vision by Snellen chart: opth  Body mass index is 25.79 kg/(m^2). BP 114/80  Pulse 87  Temp(Src) 98.5 F (36.9 C) (Oral)  Ht 5\' 5"  (1.651 m)  Wt 155 lb (70.308 kg)  BMI 25.79 kg/m2  SpO2 95%  BP 114/80  Pulse 87  Temp(Src) 98.5 F (36.9 C) (Oral)  Ht 5\' 5"  (1.651 m)  Wt 155 lb (70.308 kg)  BMI 25.79 kg/m2  SpO2 95% General appearance: alert, cooperative, appears stated age and no distress Head: Normocephalic, without obvious abnormality, atraumatic Eyes: conjunctivae/corneas clear. PERRL, EOM's intact. Fundi benign. Ears: normal TM's and external ear canals both ears Nose: Nares normal. Septum midline. Mucosa normal. No drainage or sinus tenderness. Throat: lips, mucosa, and tongue normal; teeth and gums normal Neck: no adenopathy, no carotid bruit, no JVD, supple, symmetrical, trachea midline and thyroid not enlarged, symmetric, no tenderness/mass/nodules Back: symmetric, no curvature. ROM normal. No CVA tenderness. Lungs: clear to auscultation bilaterally Breasts: normal appearance, no masses or tenderness Heart: regular rate and rhythm, S1, S2 normal, no murmur, click, rub  or gallop Abdomen: soft, non-tender; bowel sounds normal; no masses,  no organomegaly Pelvic: not indicated; post-menopausal, no abnormal Pap smears in past Extremities: extremities normal, atraumatic, no cyanosis or edema Pulses: 2+ and symmetric Skin: Skin color, texture, turgor normal. No rashes or lesions Lymph nodes: Cervical, supraclavicular, and axillary nodes normal. Neurologic: Alert and oriented X 3, normal strength and tone. Normal symmetric reflexes. Normal coordination and gait Psych-- no depression, no anxiety      Assessment:     cpe      Plan:     During the course of the visit the patient was educated and counseled about appropriate screening and preventive services including:    Pneumococcal vaccine   Influenza vaccine  Td vaccine  Screening mammography  Bone densitometry screening  Colorectal cancer screening  Glaucoma screening  Advanced directives: has an advanced directive - a copy HAS NOT been provided.  Diet review for nutrition referral? Yes ____  Not Indicated __x__   Patient Instructions (the written plan) was given to the patient.  Medicare Attestation I have personally reviewed: The patient's medical and social history Their use of alcohol, tobacco or illicit drugs Their current medications and supplements The patient's functional ability including ADLs,fall risks, home safety risks, cognitive, and hearing and visual impairment Diet and physical activities Evidence for depression or mood disorders  The patient's weight, height, BMI, and visual acuity have been recorded in the chart.  I have made referrals, counseling, and provided education to the patient based on review of the above and I have provided the patient with a written personalized care plan for preventive services.     Loreen Freud, DO   09/06/2013

## 2013-09-06 NOTE — Assessment & Plan Note (Signed)
Check bmd 

## 2013-09-06 NOTE — Progress Notes (Signed)
Pre visit review using our clinic review tool, if applicable. No additional management support is needed unless otherwise documented below in the visit note. 

## 2013-09-06 NOTE — Assessment & Plan Note (Signed)
Check labs 

## 2013-09-07 LAB — POCT URINALYSIS DIPSTICK
Bilirubin, UA: NEGATIVE
Blood, UA: NEGATIVE
Glucose, UA: NEGATIVE
Ketones, UA: NEGATIVE
Leukocytes, UA: NEGATIVE
Nitrite, UA: NEGATIVE
Protein, UA: NEGATIVE
Spec Grav, UA: <=1.005
Urobilinogen, UA: 0.2
pH, UA: 7.5

## 2013-09-12 ENCOUNTER — Other Ambulatory Visit: Payer: Self-pay | Admitting: Family Medicine

## 2013-09-12 ENCOUNTER — Ambulatory Visit (HOSPITAL_BASED_OUTPATIENT_CLINIC_OR_DEPARTMENT_OTHER): Payer: Medicare Other

## 2013-09-12 DIAGNOSIS — M1710 Unilateral primary osteoarthritis, unspecified knee: Secondary | ICD-10-CM | POA: Diagnosis not present

## 2013-09-17 DIAGNOSIS — Z1231 Encounter for screening mammogram for malignant neoplasm of breast: Secondary | ICD-10-CM | POA: Diagnosis not present

## 2013-09-17 DIAGNOSIS — M899 Disorder of bone, unspecified: Secondary | ICD-10-CM | POA: Diagnosis not present

## 2013-09-17 LAB — HM MAMMOGRAPHY

## 2013-09-18 ENCOUNTER — Encounter: Payer: Self-pay | Admitting: Family Medicine

## 2013-09-18 ENCOUNTER — Ambulatory Visit (HOSPITAL_BASED_OUTPATIENT_CLINIC_OR_DEPARTMENT_OTHER)
Admission: RE | Admit: 2013-09-18 | Discharge: 2013-09-18 | Disposition: A | Discharge status: Home / Self Care | Payer: Medicare Other | Source: Ambulatory Visit | Attending: Internal Medicine | Admitting: Internal Medicine

## 2013-09-18 ENCOUNTER — Encounter: Payer: Self-pay | Admitting: Internal Medicine

## 2013-09-18 ENCOUNTER — Ambulatory Visit (HOSPITAL_BASED_OUTPATIENT_CLINIC_OR_DEPARTMENT_OTHER): Payer: Medicare Other

## 2013-09-18 ENCOUNTER — Ambulatory Visit (INDEPENDENT_AMBULATORY_CARE_PROVIDER_SITE_OTHER): Payer: Medicare Other | Admitting: Internal Medicine

## 2013-09-18 VITALS — BP 148/76 | HR 76 | Temp 97.8°F | Wt 154.8 lb

## 2013-09-18 DIAGNOSIS — M47817 Spondylosis without myelopathy or radiculopathy, lumbosacral region: Secondary | ICD-10-CM | POA: Diagnosis not present

## 2013-09-18 DIAGNOSIS — M259 Joint disorder, unspecified: Secondary | ICD-10-CM | POA: Insufficient documentation

## 2013-09-18 DIAGNOSIS — M25559 Pain in unspecified hip: Secondary | ICD-10-CM | POA: Insufficient documentation

## 2013-09-18 DIAGNOSIS — M1610 Unilateral primary osteoarthritis, unspecified hip: Secondary | ICD-10-CM | POA: Diagnosis not present

## 2013-09-18 DIAGNOSIS — M1990 Unspecified osteoarthritis, unspecified site: Secondary | ICD-10-CM

## 2013-09-18 DIAGNOSIS — M79609 Pain in unspecified limb: Secondary | ICD-10-CM | POA: Diagnosis not present

## 2013-09-18 DIAGNOSIS — M169 Osteoarthritis of hip, unspecified: Secondary | ICD-10-CM | POA: Diagnosis not present

## 2013-09-18 NOTE — Progress Notes (Signed)
   Subjective:    Patient ID: [name] [name], female    DOB: [birth year], 79 y.o.   MRN: [id number]  HPI Acute visit, here with her daughter-in-law. 4 days ago developed pressure at the left groin without bulging or mass f/u by pain in the anterior left hip with walking and weightbearing. She also has persisting numbness in the anterior left tigh. Previously she has been very active , taking walks in the neighborhood, now needs to use a  walker. Has been taking Aleve with some relief.  Past Medical History  Diagnosis Date  . Hypothyroidism   . Insomnia   . Psoriasis   . Eczema   . Osteoporosis   . Bipolar disorder   . Osteoarthritis   . Depression     bipolar   Past Surgical History  Procedure Laterality Date  . Appendectomy  2003  . Abdominal hysterectomy  1982  . Tonsillectomy  1970's  . Ventral hernia repair  2005  . Breast lumpectomy  1947    left  . Cataract extraction  2010  . Knee arthroscopy  09/07/11    right knee  . Nasal septum surgery  1970's   History   Social History  . Marital Status: Divorced    Spouse Name: N/A    Number of Children: 1  . Years of Education: N/A   Occupational History  . retired    Social History Main Topics  . Smoking status: Former Smoker    Quit date: 09/29/1985  . Smokeless tobacco: Never Used  . Alcohol Use: No  . Drug Use: No  . Sexual Activity: Not Currently    Partners: Male   Other Topics Concern  . Not on file   Social History Narrative   Lives by herself   PT for knee----silver sneakers    Review of Systems Denies any recent falls or injuries. No weight loss or weight gain. Not rash. No pain at the rest of the lower extremities.     Objective:   Physical Exam BP 148/76  Pulse 76  Temp(Src) 97.8 F (36.6 C)  Wt 154 lb 12.8 oz (70.217 kg)  SpO2 96% General -- alert, well-developed, NAD.   Abdomen-- Not distended, good bowel sounds,soft, non-tender. Extremities-- no pretibial edema bilaterally ;  Good femoral pulses bilaterally, no rash anywhere in the lower extremities. Left anterior thigh somehow tender to palpation but without redness, warmness or swelling. Right hip rotation normal. Left hip rotation slightly limited with pain with internal rotation. Neurologic--  alert & oriented X3. Speech normal, gait antalgic.  DTRs symmetric  Psych-- Cognition and judgment appear intact. Cooperative with normal attention span and concentration. No anxious appearing , no depressed appearing.     Assessment & Plan:

## 2013-09-18 NOTE — Assessment & Plan Note (Signed)
Presents with 4 days history of left hip pain and numbness/tenderness at the left thigh. No history of injury, vascular exam normal. Plan: X-rays Referral to ortho Likes to continue with Aleve, and that is okay, GI precautions discussed. See instructions

## 2013-09-18 NOTE — Patient Instructions (Signed)
Get the XR at [facility], corner of [address] (10 minutes form here); they are open 24/7 [address]  [place], [place] [address] [phone]  Aleve, one or 2 a day   as needed for pain. Always take it with food because may cause gastritis and ulcers. If you notice nausea, stomach pain, change in the color of stools --->  Stop the medicine and let us know  Will arrange a visit with DR Valeria Batman,  Call if not improving or if worse

## 2013-09-24 DIAGNOSIS — M2550 Pain in unspecified joint: Secondary | ICD-10-CM | POA: Diagnosis not present

## 2013-09-24 DIAGNOSIS — M25569 Pain in unspecified knee: Secondary | ICD-10-CM | POA: Diagnosis not present

## 2013-09-24 DIAGNOSIS — M25559 Pain in unspecified hip: Secondary | ICD-10-CM | POA: Diagnosis not present

## 2013-09-26 ENCOUNTER — Other Ambulatory Visit (HOSPITAL_COMMUNITY): Payer: Self-pay | Admitting: Orthopaedic Surgery

## 2013-09-26 DIAGNOSIS — M549 Dorsalgia, unspecified: Secondary | ICD-10-CM

## 2013-10-02 ENCOUNTER — Encounter (HOSPITAL_COMMUNITY): Payer: Medicare Other

## 2013-10-02 ENCOUNTER — Ambulatory Visit (HOSPITAL_COMMUNITY): Payer: Medicare Other

## 2013-10-08 DIAGNOSIS — M79609 Pain in unspecified limb: Secondary | ICD-10-CM | POA: Diagnosis not present

## 2013-10-08 DIAGNOSIS — B351 Tinea unguium: Secondary | ICD-10-CM | POA: Diagnosis not present

## 2013-11-07 DIAGNOSIS — F319 Bipolar disorder, unspecified: Secondary | ICD-10-CM | POA: Diagnosis not present

## 2013-11-16 DIAGNOSIS — H04129 Dry eye syndrome of unspecified lacrimal gland: Secondary | ICD-10-CM | POA: Diagnosis not present

## 2013-11-16 DIAGNOSIS — Z961 Presence of intraocular lens: Secondary | ICD-10-CM | POA: Diagnosis not present

## 2013-12-11 DIAGNOSIS — B351 Tinea unguium: Secondary | ICD-10-CM | POA: Diagnosis not present

## 2013-12-11 DIAGNOSIS — M79609 Pain in unspecified limb: Secondary | ICD-10-CM | POA: Diagnosis not present

## 2014-01-02 ENCOUNTER — Telehealth: Payer: Self-pay

## 2014-01-02 NOTE — Telephone Encounter (Signed)
Spoke with patient and I made her aware that she has Osteoporosis, she voiced understanding and requested to have Prolia done. Forms faxed.     KP

## 2014-01-22 ENCOUNTER — Encounter: Payer: Self-pay | Admitting: Family Medicine

## 2014-02-11 NOTE — Telephone Encounter (Signed)
Benefits verified. No PA patient has met the 147 dollar deductible and has a secondary insurance that will cove the 20 percent. Medicare will pay 80 percent based on medical necessity. I have tried to call the patient. MSG left to call the office      KP

## 2014-02-13 ENCOUNTER — Ambulatory Visit (INDEPENDENT_AMBULATORY_CARE_PROVIDER_SITE_OTHER): Payer: Medicare Other | Admitting: *Deleted

## 2014-02-13 DIAGNOSIS — M810 Age-related osteoporosis without current pathological fracture: Secondary | ICD-10-CM

## 2014-02-13 MED ORDER — DENOSUMAB 60 MG/ML ~~LOC~~ SOLN
60.0000 mg | Freq: Once | SUBCUTANEOUS | Status: AC
  Administered 2014-02-13: 60 mg via SUBCUTANEOUS

## 2014-02-25 DIAGNOSIS — M79609 Pain in unspecified limb: Secondary | ICD-10-CM | POA: Diagnosis not present

## 2014-02-25 DIAGNOSIS — B351 Tinea unguium: Secondary | ICD-10-CM | POA: Diagnosis not present

## 2014-03-12 ENCOUNTER — Other Ambulatory Visit: Payer: Self-pay | Admitting: Family Medicine

## 2014-04-08 ENCOUNTER — Ambulatory Visit (INDEPENDENT_AMBULATORY_CARE_PROVIDER_SITE_OTHER): Payer: Medicare Other | Admitting: Family Medicine

## 2014-04-08 ENCOUNTER — Encounter: Payer: Self-pay | Admitting: Family Medicine

## 2014-04-08 VITALS — BP 132/90 | HR 83 | Temp 98.5°F | Wt 154.0 lb

## 2014-04-08 DIAGNOSIS — R300 Dysuria: Secondary | ICD-10-CM | POA: Diagnosis not present

## 2014-04-08 DIAGNOSIS — N3000 Acute cystitis without hematuria: Secondary | ICD-10-CM | POA: Diagnosis not present

## 2014-04-08 LAB — POCT URINALYSIS DIPSTICK
Bilirubin, UA: NEGATIVE
Glucose, UA: NEGATIVE
Ketones, UA: NEGATIVE
Nitrite, UA: POSITIVE
Protein, UA: NEGATIVE
Spec Grav, UA: <=1.005
Urobilinogen, UA: 0.2
pH, UA: 6.5

## 2014-04-08 MED ORDER — CIPROFLOXACIN HCL 250 MG PO TABS: 250.0000 mg | ORAL_TABLET | Freq: Two times a day (BID) | ORAL | Status: DC

## 2014-04-08 NOTE — Progress Notes (Signed)
  Subjective:    [name] [name] is a 80 y.o. female who complains of dysuria, frequency and nausea. She has had symptoms for 4 days. Patient also complains of stomach ache. Patient denies back pain, congestion, cough, fever, headache, rhinitis, sorethroat and vaginal discharge. Patient does not have a history of recurrent UTI. Patient does not have a history of pyelonephritis.   The following portions of the patient's history were reviewed and updated as appropriate: allergies, current medications, past family history, past medical history, past social history, past surgical history and problem list.  Review of Systems Pertinent items are noted in HPI.    Objective:    BP 132/90  Pulse 83  Temp(Src) 98.5 F (36.9 C) (Oral)  Wt 154 lb (69.854 kg)  SpO2 95% General appearance: alert, cooperative, appears stated age and no distress Abdomen: soft, non-tender; bowel sounds normal; no masses,  no organomegaly  Laboratory:  Urine dipstick: trace for hemoglobin, 3+ for leukocyte esterase and pos for nitrites.   Micro exam: not done.    Assessment:    UTI     Plan:    Medications: ciprofloxacin. Maintain adequate hydration. Follow up if symptoms not improving, and as needed.

## 2014-04-08 NOTE — Patient Instructions (Signed)

## 2014-04-08 NOTE — Progress Notes (Signed)
Pre visit review using our clinic review tool, if applicable. No additional management support is needed unless otherwise documented below in the visit note. 

## 2014-04-09 DIAGNOSIS — R300 Dysuria: Secondary | ICD-10-CM | POA: Diagnosis not present

## 2014-04-12 ENCOUNTER — Telehealth: Payer: Self-pay

## 2014-04-12 DIAGNOSIS — N3000 Acute cystitis without hematuria: Secondary | ICD-10-CM

## 2014-04-12 MED ORDER — CIPROFLOXACIN HCL 500 MG PO TABS: 500.0000 mg | ORAL_TABLET | Freq: Two times a day (BID) | ORAL | Status: DC

## 2014-04-12 NOTE — Telephone Encounter (Signed)
Patient has been made aware and voiced understanding. Rx faxed      KP 

## 2014-04-12 NOTE — Telephone Encounter (Signed)
MSG from patient who stated she is still having Dysuria after taking her Cipro for 3 days, she wants to know if she could get another refill. Please advise     KP

## 2014-04-12 NOTE — Telephone Encounter (Signed)
cipro 500 mg 1 po bid for 5 days

## 2014-04-13 LAB — URINE CULTURE: Colony Count: >=100000

## 2014-04-15 DIAGNOSIS — M216X9 Other acquired deformities of unspecified foot: Secondary | ICD-10-CM | POA: Diagnosis not present

## 2014-04-17 ENCOUNTER — Other Ambulatory Visit: Payer: Self-pay

## 2014-04-17 MED ORDER — CIPROFLOXACIN HCL 500 MG PO TABS: 500.0000 mg | ORAL_TABLET | Freq: Two times a day (BID) | ORAL | Status: DC

## 2014-05-16 DIAGNOSIS — F319 Bipolar disorder, unspecified: Secondary | ICD-10-CM | POA: Diagnosis not present

## 2014-05-28 DIAGNOSIS — M25559 Pain in unspecified hip: Secondary | ICD-10-CM | POA: Diagnosis not present

## 2014-06-07 ENCOUNTER — Other Ambulatory Visit: Payer: Self-pay

## 2014-06-07 DIAGNOSIS — M545 Low back pain, unspecified: Secondary | ICD-10-CM | POA: Diagnosis not present

## 2014-06-07 MED ORDER — LEVOTHYROXINE SODIUM 75 MCG PO TABS: ORAL_TABLET | ORAL | Status: DC

## 2014-06-17 DIAGNOSIS — M48061 Spinal stenosis, lumbar region without neurogenic claudication: Secondary | ICD-10-CM | POA: Diagnosis not present

## 2014-07-01 ENCOUNTER — Other Ambulatory Visit: Payer: Self-pay | Admitting: Orthopedic Surgery

## 2014-07-01 DIAGNOSIS — M21372 Foot drop, left foot: Secondary | ICD-10-CM

## 2014-07-01 DIAGNOSIS — M48061 Spinal stenosis, lumbar region without neurogenic claudication: Secondary | ICD-10-CM

## 2014-07-11 ENCOUNTER — Other Ambulatory Visit: Payer: Self-pay | Admitting: Orthopedic Surgery

## 2014-07-11 ENCOUNTER — Ambulatory Visit
Admission: RE | Admit: 2014-07-11 | Discharge: 2014-07-11 | Disposition: A | Discharge status: Home / Self Care | Payer: Self-pay | Source: Ambulatory Visit | Attending: Orthopedic Surgery | Admitting: Orthopedic Surgery

## 2014-07-11 ENCOUNTER — Ambulatory Visit
Admission: RE | Admit: 2014-07-11 | Discharge: 2014-07-11 | Disposition: A | Discharge status: Home / Self Care | Payer: Medicare Other | Source: Ambulatory Visit | Attending: Orthopedic Surgery | Admitting: Orthopedic Surgery

## 2014-07-11 VITALS — BP 157/69 | HR 66

## 2014-07-11 DIAGNOSIS — M21372 Foot drop, left foot: Secondary | ICD-10-CM

## 2014-07-11 DIAGNOSIS — M4120 Other idiopathic scoliosis, site unspecified: Secondary | ICD-10-CM | POA: Diagnosis not present

## 2014-07-11 DIAGNOSIS — M48061 Spinal stenosis, lumbar region without neurogenic claudication: Secondary | ICD-10-CM

## 2014-07-11 DIAGNOSIS — M216X9 Other acquired deformities of unspecified foot: Secondary | ICD-10-CM | POA: Diagnosis not present

## 2014-07-11 DIAGNOSIS — M5137 Other intervertebral disc degeneration, lumbosacral region: Secondary | ICD-10-CM | POA: Diagnosis not present

## 2014-07-11 DIAGNOSIS — M4310 Spondylolisthesis, site unspecified: Secondary | ICD-10-CM | POA: Diagnosis not present

## 2014-07-11 DIAGNOSIS — M47817 Spondylosis without myelopathy or radiculopathy, lumbosacral region: Secondary | ICD-10-CM | POA: Diagnosis not present

## 2014-07-11 MED ORDER — IOHEXOL 180 MG/ML  SOLN
15.0000 mL | Freq: Once | INTRAMUSCULAR | Status: AC | PRN
  Administered 2014-07-11: 15 mL via INTRATHECAL

## 2014-07-11 MED ORDER — DIAZEPAM 5 MG PO TABS
5.0000 mg | ORAL_TABLET | Freq: Once | ORAL | Status: AC
  Administered 2014-07-11: 5 mg via ORAL

## 2014-07-11 NOTE — Discharge Instructions (Signed)
Myelogram Discharge Instructions  1. Go home and rest quietly for the next 24 hours.  It is important to lie flat for the next 24 hours.  Get up only to go to the restroom.  You may lie in the bed or on a couch on your back, your stomach, your left side or your right side.  You may have one pillow under your head.  You may have pillows between your knees while you are on your side or under your knees while you are on your back.  2. DO NOT drive today.  Recline the seat as far back as it will go, while still wearing your seat belt, on the way home.  3. You may get up to go to the bathroom as needed.  You may sit up for 10 minutes to eat.  You may resume your normal diet and medications unless otherwise indicated.  Drink lots of extra fluids today and tomorrow.  4. The incidence of headache, nausea, or vomiting is about 5% (one in 20 patients).  If you develop a headache, lie flat and drink plenty of fluids until the headache goes away.  Caffeinated beverages may be helpful.  If you develop severe nausea and vomiting or a headache that does not go away with flat bed rest, call [phone].  5. You may resume normal activities after your 24 hours of bed rest is over; however, do not exert yourself strongly or do any heavy lifting tomorrow. If when you get up you have a headache when standing, go back to bed and force fluids for another 24 hours.  6. Call your physician for a follow-up appointment.  The results of your myelogram will be sent directly to your physician by the following day.  7. If you have any questions or if complications develop after you arrive home, please call [phone].  Discharge instructions have been explained to the patient.  The patient, or the person responsible for the patient, fully understands these instructions.      May resume Abilify on Sept. 25, 2015, after 1:00 pm.

## 2014-07-11 NOTE — Progress Notes (Signed)
Pt states she has been off Abilify for the past 2 days. Discharge instructions explained to pt.

## 2014-07-22 DIAGNOSIS — B351 Tinea unguium: Secondary | ICD-10-CM | POA: Diagnosis not present

## 2014-07-22 DIAGNOSIS — M79674 Pain in right toe(s): Secondary | ICD-10-CM | POA: Diagnosis not present

## 2014-07-22 DIAGNOSIS — M79675 Pain in left toe(s): Secondary | ICD-10-CM | POA: Diagnosis not present

## 2014-07-24 ENCOUNTER — Other Ambulatory Visit (HOSPITAL_COMMUNITY): Payer: Self-pay | Admitting: Psychiatry

## 2014-07-26 NOTE — Telephone Encounter (Signed)
Chart reviewed, refill not appropriate. Never seen at this practice or under Dr. Casimiro Needle

## 2014-08-08 DIAGNOSIS — M4807 Spinal stenosis, lumbosacral region: Secondary | ICD-10-CM | POA: Diagnosis not present

## 2014-09-04 ENCOUNTER — Telehealth: Payer: Self-pay | Admitting: Family Medicine

## 2014-09-04 NOTE — Telephone Encounter (Signed)
Pt scheduled prolia injection 09/06/14.

## 2014-09-06 ENCOUNTER — Ambulatory Visit (INDEPENDENT_AMBULATORY_CARE_PROVIDER_SITE_OTHER): Payer: Medicare Other

## 2014-09-06 DIAGNOSIS — M810 Age-related osteoporosis without current pathological fracture: Secondary | ICD-10-CM | POA: Diagnosis not present

## 2014-09-06 MED ORDER — DENOSUMAB 60 MG/ML ~~LOC~~ SOLN
60.0000 mg | Freq: Once | SUBCUTANEOUS | Status: AC
  Administered 2014-09-06: 60 mg via SUBCUTANEOUS

## 2014-09-06 NOTE — Progress Notes (Signed)
Pre visit review using our clinic review tool, if applicable. No additional management support is needed unless otherwise documented below in the visit note. 

## 2014-09-06 NOTE — Progress Notes (Signed)
Pt tolerated injection well

## 2014-09-30 DIAGNOSIS — Z1231 Encounter for screening mammogram for malignant neoplasm of breast: Secondary | ICD-10-CM | POA: Diagnosis not present

## 2014-10-15 ENCOUNTER — Emergency Department (HOSPITAL_BASED_OUTPATIENT_CLINIC_OR_DEPARTMENT_OTHER): Payer: Medicare Other

## 2014-10-15 ENCOUNTER — Emergency Department (HOSPITAL_BASED_OUTPATIENT_CLINIC_OR_DEPARTMENT_OTHER)
Admission: EM | Admit: 2014-10-15 | Discharge: 2014-10-15 | Disposition: A | Discharge status: Home / Self Care | Payer: Medicare Other | Attending: Emergency Medicine | Admitting: Emergency Medicine

## 2014-10-15 ENCOUNTER — Encounter (HOSPITAL_BASED_OUTPATIENT_CLINIC_OR_DEPARTMENT_OTHER): Payer: Self-pay | Admitting: *Deleted

## 2014-10-15 DIAGNOSIS — S0990XA Unspecified injury of head, initial encounter: Secondary | ICD-10-CM | POA: Diagnosis not present

## 2014-10-15 DIAGNOSIS — I6782 Cerebral ischemia: Secondary | ICD-10-CM | POA: Diagnosis not present

## 2014-10-15 DIAGNOSIS — Y92000 Kitchen of unspecified non-institutional (private) residence as  the place of occurrence of the external cause: Secondary | ICD-10-CM | POA: Diagnosis not present

## 2014-10-15 DIAGNOSIS — F329 Major depressive disorder, single episode, unspecified: Secondary | ICD-10-CM | POA: Insufficient documentation

## 2014-10-15 DIAGNOSIS — M810 Age-related osteoporosis without current pathological fracture: Secondary | ICD-10-CM | POA: Insufficient documentation

## 2014-10-15 DIAGNOSIS — S8001XA Contusion of right knee, initial encounter: Secondary | ICD-10-CM | POA: Insufficient documentation

## 2014-10-15 DIAGNOSIS — E039 Hypothyroidism, unspecified: Secondary | ICD-10-CM | POA: Insufficient documentation

## 2014-10-15 DIAGNOSIS — S01111A Laceration without foreign body of right eyelid and periocular area, initial encounter: Secondary | ICD-10-CM | POA: Insufficient documentation

## 2014-10-15 DIAGNOSIS — Z872 Personal history of diseases of the skin and subcutaneous tissue: Secondary | ICD-10-CM | POA: Diagnosis not present

## 2014-10-15 DIAGNOSIS — W19XXXA Unspecified fall, initial encounter: Secondary | ICD-10-CM

## 2014-10-15 DIAGNOSIS — S8991XA Unspecified injury of right lower leg, initial encounter: Secondary | ICD-10-CM | POA: Diagnosis not present

## 2014-10-15 DIAGNOSIS — Y9389 Activity, other specified: Secondary | ICD-10-CM | POA: Diagnosis not present

## 2014-10-15 DIAGNOSIS — W010XXA Fall on same level from slipping, tripping and stumbling without subsequent striking against object, initial encounter: Secondary | ICD-10-CM | POA: Diagnosis not present

## 2014-10-15 DIAGNOSIS — Y998 Other external cause status: Secondary | ICD-10-CM | POA: Insufficient documentation

## 2014-10-15 DIAGNOSIS — M1711 Unilateral primary osteoarthritis, right knee: Secondary | ICD-10-CM | POA: Diagnosis not present

## 2014-10-15 DIAGNOSIS — Z792 Long term (current) use of antibiotics: Secondary | ICD-10-CM | POA: Diagnosis not present

## 2014-10-15 DIAGNOSIS — Z87891 Personal history of nicotine dependence: Secondary | ICD-10-CM | POA: Diagnosis not present

## 2014-10-15 DIAGNOSIS — G4700 Insomnia, unspecified: Secondary | ICD-10-CM | POA: Insufficient documentation

## 2014-10-15 DIAGNOSIS — M25561 Pain in right knee: Secondary | ICD-10-CM | POA: Diagnosis not present

## 2014-10-15 DIAGNOSIS — M25461 Effusion, right knee: Secondary | ICD-10-CM

## 2014-10-15 DIAGNOSIS — S199XXA Unspecified injury of neck, initial encounter: Secondary | ICD-10-CM | POA: Diagnosis not present

## 2014-10-15 DIAGNOSIS — IMO0002 Reserved for concepts with insufficient information to code with codable children: Secondary | ICD-10-CM

## 2014-10-15 MED ORDER — LIDOCAINE HCL (PF) 1 % IJ SOLN
10.0000 mL | Freq: Once | INTRAMUSCULAR | Status: AC
  Administered 2014-10-15: 10 mL
  Filled 2014-10-15: qty 10

## 2014-10-15 NOTE — ED Provider Notes (Signed)
CSN: [id number]     Arrival date & time 10/15/14  1500 History   First MD Initiated Contact with Patient 10/15/14 1617     Chief Complaint  Patient presents with  . Laceration     (Consider location/radiation/quality/duration/timing/severity/associated sxs/prior Treatment) HPI   This is a 80 year old female who presents emergency Department with chief complaint of fall. The patient states she slipped on cleaning fluid in her kitchen today and fell face forward onto her right knee and right side of her head. She denies loss of consciousness, changes in vision, difficulty with speech or slurring. Patient was unable to get up because of her right knee pain. She complains of pain and swelling in the right knee. She just complains of laceration near the right eyebrow. She has a history of severe osteoarthritis of the right knee. she is up-to-date on her tetanus vaccination  Past Medical History  Diagnosis Date  . Hypothyroidism   . Insomnia   . Psoriasis   . Eczema   . Osteoporosis   . Bipolar disorder   . Osteoarthritis   . Depression     bipolar   Past Surgical History  Procedure Laterality Date  . Appendectomy  2003  . Abdominal hysterectomy  1982  . Tonsillectomy  1970's  . Ventral hernia repair  2005  . Breast lumpectomy  1947    left  . Cataract extraction  2010  . Knee arthroscopy  09/07/11    right knee  . Nasal septum surgery  1970's   Family History  Problem Relation Age of Onset  . Colon cancer Brother   . Lung cancer Brother   . Lung cancer Father   . Prostate cancer Father   . Arthritis Father   . Cancer Father     lung, prostate  . Heart disease Brother   . Aortic aneurysm Brother   . Cancer Brother     lung  . Cancer Other     colon   History  Substance Use Topics  . Smoking status: Former Smoker    Quit date: 09/29/1985  . Smokeless tobacco: Never Used  . Alcohol Use: No   OB History    No data available     Review of Systems Ten systems  reviewed and are negative for acute change, except as noted in the HPI.     Allergies  Bupropion hcl  Home Medications   Prior to Admission medications   Medication Sig Start Date End Date Taking? Authorizing Provider  ammonium lactate (LAC-HYDRIN) 12 % cream Apply topically as needed for dry skin. 09/06/13   Alferd Apa Lowne, DO  ARIPiprazole (ABILIFY) 2 MG tablet Take 5 mg by mouth daily.     Historical Provider, MD  b complex vitamins tablet Take 2 tablets by mouth daily.    Historical Provider, MD  calcium carbonate (OS-CAL) 600 MG TABS Take 600 mg by mouth 2 (two) times daily with a meal.      Historical Provider, MD  cholecalciferol (VITAMIN D) 1000 UNITS tablet Take 2 Units by mouth daily.     Historical Provider, MD  ciprofloxacin (CIPRO) 500 MG tablet Take 1 tablet (500 mg total) by mouth 2 (two) times daily. 04/17/14   Rosalita Chessman, DO  Cyanocobalamin (B-12) 2500 MCG TABS Take 1 tablet by mouth daily.    Historical Provider, MD  fish oil-omega-3 fatty acids 1000 MG capsule Take 1 g by mouth daily.      Historical Provider, MD  Glucosamine 500 MG TABS Take 2 tablets by mouth daily.    Historical Provider, MD  levothyroxine (SYNTHROID, LEVOTHROID) 75 MCG tablet TAKE 1 TABLET (75 MCG TOTAL) BY MOUTH DAILY BEFORE BREAKFAST. 06/07/14   Alferd Apa Lowne, DO  Lutein 40 MG CAPS Take 1 capsule by mouth daily.      Historical Provider, MD  magnesium oxide (MAG-OX) 400 MG tablet Take 800 mg by mouth daily.    Historical Provider, MD  multivitamin [facility]) per tablet Take 1 tablet by mouth daily.      Historical Provider, MD  Sodium Hyaluronate (EUFLEXXA) 20 MG/2ML SOSY Inject 20 mg into the articular space every 21 ( twenty-one) days.    Historical Provider, MD  TDaP Durwin Reges) 5-2.5-18.5 LF-MCG/0.5 injection Inject 0.5 mLs into the muscle once. 09/06/13   Rosalita Chessman, DO  zolpidem (AMBIEN) 10 MG tablet Take 10 mg by mouth at bedtime as needed.      Historical Provider, MD   BP 194/92  mmHg  Pulse 100  Temp(Src) 98.3 F (36.8 C) (Oral)  Resp 18  Ht 5\' 7"  (1.702 m)  Wt 155 lb (70.308 kg)  BMI 24.27 kg/m2  SpO2 99% Physical Exam  Constitutional: She is oriented to person, place, and time. She appears well-developed and well-nourished. No distress.  HENT:  Head: Normocephalic.    4cm laceration Proximal to the R eyebrow.  Eyes: Conjunctivae are normal. No scleral icterus.  Neck: Normal range of motion.  Cardiovascular: Normal rate, regular rhythm and normal heart sounds.  Exam reveals no gallop and no friction rub.   No murmur heard. Pulmonary/Chest: Effort normal and breath sounds normal. No respiratory distress.  Abdominal: Soft. Bowel sounds are normal. She exhibits no distension and no mass. There is no tenderness. There is no guarding.  Musculoskeletal:  Right knee with significant swelling and ecchymosis. Exquisitely tender to palpation over the patella. Range of motion is limited due to pain. Distal pulses intact.  Neurological: She is alert and oriented to person, place, and time.  Skin: Skin is warm and dry. She is not diaphoretic.  Nursing note and vitals reviewed.   ED Course  Procedures (including critical care time) Labs Review Labs Reviewed - No data to display  Imaging Review No results found.   EKG Interpretation None     LACERATION REPAIR Performed by: Margarita Mail Authorized by: Margarita Mail Consent: Verbal consent obtained. Risks and benefits: risks, benefits and alternatives were discussed Consent given by: patient Patient identity confirmed: provided demographic data Prepped and Draped in normal sterile fashion Wound explored  Laceration Location: R eyebrow  Laceration Length: 4 cm  No Foreign Bodies seen or palpated  Anesthesia: local infiltration  Local anesthetic: lidocaine 1% w/o epinephrine  Anesthetic total: 4 ml  Irrigation method: syringe Amount of cleaning: standard  Skin closure: 5.0 vicryl Rapide;  6.0 ethilon  Number of sutures: 6  Technique: 1 subq; 5 SI  Patient tolerance: Patient tolerated the procedure well with no immediate complications.  MDM   Final diagnoses:  Fall  Laceration  Knee injury, right, initial encounter  Knee effusion, right    Negative ct head and c-spine.  R knee with OA and large effusion/ Patient has a walker at home and will be assisted by her son who is present with the patient. Patient laceration well approximated. No Lid tug or lid margin abnormalities. She appears safe for discharge . The patient has pain medicine at home.    Margarita Mail, PA-C 10/15/14  1924  Mariea Clonts, MD 10/16/14 0001

## 2014-10-15 NOTE — ED Notes (Signed)
PA at bedside.

## 2014-10-15 NOTE — Discharge Instructions (Signed)
WOUND CARE Please have your stitches/staples removed in 7 days or sooner if you have concerns. You may do this at any available urgent care or at your primary care doctor's office.  Keep area clean and dry for 24 hours. Do not remove bandage, if applied.  After 24 hours, remove bandage and wash wound gently with mild soap and warm water. Reapply a new bandage after cleaning wound, if directed.  Continue daily cleansing with soap and water until stitches/staples are removed.  Do not apply any ointments or creams to the wound while stitches/staples are in place, as this may cause delayed healing.  Seek medical careif you experience any of the following signs of infection: Swelling, redness, pus drainage, streaking, fever >101.0 F  Seek care if you experience excessive bleeding that does not stop after 15-20 minutes of constant, firm pressure.  Fall Prevention and Home Safety Falls cause injuries and can affect all age groups. It is possible to use preventive measures to significantly decrease the likelihood of falls. There are many simple measures which can make your home safer and prevent falls. OUTDOORS  Repair cracks and edges of walkways and driveways.  Remove high doorway thresholds.  Trim shrubbery on the main path into your home.  Have good outside lighting.  Clear walkways of tools, rocks, debris, and clutter.  Check that handrails are not broken and are securely fastened. Both sides of steps should have handrails.  Have leaves, snow, and ice cleared regularly.  Use sand or salt on walkways during winter months.  In the garage, clean up grease or oil spills. BATHROOM  Install night lights.  Install grab bars by the toilet and in the tub and shower.  Use non-skid mats or decals in the tub or shower.  Place a plastic non-slip stool in the shower to sit on, if needed.  Keep floors dry and clean up all water on the floor immediately.  Remove soap buildup in  the tub or shower on a regular basis.  Secure bath mats with non-slip, double-sided rug tape.  Remove throw rugs and tripping hazards from the floors. BEDROOMS  Install night lights.  Make sure a bedside light is easy to reach.  Do not use oversized bedding.  Keep a telephone by your bedside.  Have a firm chair with side arms to use for getting dressed.  Remove throw rugs and tripping hazards from the floor. KITCHEN  Keep handles on pots and pans turned toward the center of the stove. Use back burners when possible.  Clean up spills quickly and allow time for drying.  Avoid walking on wet floors.  Avoid hot utensils and knives.  Position shelves so they are not too high or low.  Place commonly used objects within easy reach.  If necessary, use a sturdy step stool with a grab bar when reaching.  Keep electrical cables out of the way.  Do not use floor polish or wax that makes floors slippery. If you must use wax, use non-skid floor wax.  Remove throw rugs and tripping hazards from the floor. STAIRWAYS  Never leave objects on stairs.  Place handrails on both sides of stairways and use them. Fix any loose handrails. Make sure handrails on both sides of the stairways are as long as the stairs.  Check carpeting to make sure it is firmly attached along stairs. Make repairs to worn or loose carpet promptly.  Avoid placing throw rugs at the top or bottom of stairways, or properly secure  the rug with carpet tape to prevent slippage. Get rid of throw rugs, if possible.  Have an electrician put in a light switch at the top and bottom of the stairs. OTHER FALL PREVENTION TIPS  Wear low-heel or rubber-soled shoes that are supportive and fit well. Wear closed toe shoes.  When using a stepladder, make sure it is fully opened and both spreaders are firmly locked. Do not climb a closed stepladder.  Add color or contrast paint or tape to grab bars and handrails in your home.  Place contrasting color strips on first and last steps.  Learn and use mobility aids as needed. Install an electrical emergency response system.  Turn on lights to avoid dark areas. Replace light bulbs that burn out immediately. Get light switches that glow.  Arrange furniture to create clear pathways. Keep furniture in the same place.  Firmly attach carpet with non-skid or double-sided tape.  Eliminate uneven floor surfaces.  Select a carpet pattern that does not visually hide the edge of steps.  Be aware of all pets. OTHER HOME SAFETY TIPS  Set the water temperature for 120 F (48.8 C).  Keep emergency numbers on or near the telephone.  Keep smoke detectors on every level of the home and near sleeping areas. Document Released: 09/24/2002 Document Revised: 04/04/2012 Document Reviewed: 12/24/2011 [facility] Patient Information 2015 [place], [place]. This information is not intended to replace advice given to you by your health care provider. Make sure you discuss any questions you have with your health care provider.   Knee Effusion The medical term for having fluid in your knee is effusion. This is often due to an internal derangement of the knee. This means something is wrong inside the knee. Some of the causes of fluid in the knee may be torn cartilage, a torn ligament, or bleeding into the joint from an injury. Your knee is likely more difficult to bend and move. This is often because there is increased pain and pressure in the joint. The time it takes for recovery from a knee effusion depends on different factors, including:   Type of injury.  Your age.  Physical and medical conditions.  Rehabilitation Strategies. How long you will be away from your normal activities will depend on what kind of knee problem you have and how much damage is present. Your knee has two types of cartilage. Articular cartilage covers the bone ends and lets your knee bend and move smoothly. Two  menisci, thick pads of cartilage that form a rim inside the joint, help absorb shock and stabilize your knee. Ligaments bind the bones together and support your knee joint. Muscles move the joint, help support your knee, and take stress off the joint itself. CAUSES  Often an effusion in the knee is caused by an injury to one of the menisci. This is often a tear in the cartilage. Recovery after a meniscus injury depends on how much meniscus is damaged and whether you have damaged other knee tissue. Small tears may heal on their own with conservative treatment. Conservative means rest, limited weight bearing activity and muscle strengthening exercises. Your recovery may take up to 6 weeks.  TREATMENT  Larger tears may require surgery. Meniscus injuries may be treated during arthroscopy. Arthroscopy is a procedure in which your surgeon uses a small telescope like instrument to look in your knee. Your caregiver can make a more accurate diagnosis (learning what is wrong) by performing an arthroscopic procedure. If your injury is on the  inner margin of the meniscus, your surgeon may trim the meniscus back to a smooth rim. In other cases your surgeon will try to repair a damaged meniscus with stitches (sutures). This may make rehabilitation take longer, but may provide better long term result by helping your knee keep its shock absorption capabilities. Ligaments which are completely torn usually require surgery for repair. HOME CARE INSTRUCTIONS  Use crutches as instructed.  If a brace is applied, use as directed.  Once you are home, an ice pack applied to your swollen knee may help with discomfort and help decrease swelling.  Keep your knee raised (elevated) when you are not up and around or on crutches.  Only take over-the-counter or prescription medicines for pain, discomfort, or fever as directed by your caregiver.  Your caregivers will help with instructions for rehabilitation of your knee. This  often includes strengthening exercises.  You may resume a normal diet and activities as directed. SEEK MEDICAL CARE IF:   There is increased swelling in your knee.  You notice redness, swelling, or increasing pain in your knee.  An unexplained oral temperature above 102 F (38.9 C) develops. SEEK IMMEDIATE MEDICAL CARE IF:   You develop a rash.  You have difficulty breathing.  You have any allergic reactions from medications you may have been given.  There is severe pain with any motion of the knee. MAKE SURE YOU:   Understand these instructions.  Will watch your condition.  Will get help right away if you are not doing well or get worse. Document Released: 12/25/2003 Document Revised: 12/27/2011 Document Reviewed: 02/28/2008 [facility] Patient Information 2015 [place], [place]. This information is not intended to replace advice given to you by your health care provider. Make sure you discuss any questions you have with your health care provider. RICE: Routine Care for Injuries The routine care of many injuries includes Rest, Ice, Compression, and Elevation (RICE). HOME CARE INSTRUCTIONS  Rest is needed to allow your body to heal. Routine activities can usually be resumed when comfortable. Injured tendons and bones can take up to 6 weeks to heal. Tendons are the cord-like structures that attach muscle to bone.  Ice following an injury helps keep the swelling down and reduces pain.  Put ice in a plastic bag.  Place a towel between your skin and the bag.  Leave the ice on for 15-20 minutes, 3-4 times a day, or as directed by your health care provider. Do this while awake, for the first 24 to 48 hours. After that, continue as directed by your caregiver.  Compression helps keep swelling down. It also gives support and helps with discomfort. If an elastic bandage has been applied, it should be removed and reapplied every 3 to 4 hours. It should not be applied tightly, but firmly  enough to keep swelling down. Watch fingers or toes for swelling, bluish discoloration, coldness, numbness, or excessive pain. If any of these problems occur, remove the bandage and reapply loosely. Contact your caregiver if these problems continue.  Elevation helps reduce swelling and decreases pain. With extremities, such as the arms, hands, legs, and feet, the injured area should be placed near or above the level of the heart, if possible. SEEK IMMEDIATE MEDICAL CARE IF:  You have persistent pain and swelling.  You develop redness, numbness, or unexpected weakness.  Your symptoms are getting worse rather than improving after several days. These symptoms may indicate that further evaluation or further X-rays are needed. Sometimes, X-rays may not  show a small broken bone (fracture) until 1 week or 10 days later. Make a follow-up appointment with your caregiver. Ask when your X-ray results will be ready. Make sure you get your X-ray results. Document Released: 01/16/2001 Document Revised: 10/09/2013 Document Reviewed: 03/05/2011 [facility] Patient Information 2015 [place], [place]. This information is not intended to replace advice given to you by your health care provider. Make sure you discuss any questions you have with your health care provider.

## 2014-10-15 NOTE — ED Notes (Addendum)
Pt c/o fall from standing hitting head on floor, laceration to right eye brow, also c/o right knee pain

## 2014-10-23 ENCOUNTER — Encounter: Payer: Self-pay | Admitting: Medical

## 2014-10-23 ENCOUNTER — Ambulatory Visit (INDEPENDENT_AMBULATORY_CARE_PROVIDER_SITE_OTHER): Payer: Medicare Other | Admitting: Medical

## 2014-10-23 VITALS — BP 150/90 | HR 100 | Temp 98.2°F | Ht 65.5 in | Wt 158.2 lb

## 2014-10-23 DIAGNOSIS — S8001XD Contusion of right knee, subsequent encounter: Secondary | ICD-10-CM | POA: Diagnosis not present

## 2014-10-23 DIAGNOSIS — S8001XA Contusion of right knee, initial encounter: Secondary | ICD-10-CM | POA: Insufficient documentation

## 2014-10-23 DIAGNOSIS — Z4802 Encounter for removal of sutures: Secondary | ICD-10-CM | POA: Diagnosis not present

## 2014-10-23 NOTE — Progress Notes (Signed)
Pre visit review using our clinic review tool, if applicable. No additional management support is needed unless otherwise documented below in the visit note. 

## 2014-10-23 NOTE — Assessment & Plan Note (Signed)
For your rt eye brow region laceration, it looks well healed and removed all sutures. Looks well healed enough that no steri strips needed. For next 4 days pat wash and dry this area.

## 2014-10-23 NOTE — Assessment & Plan Note (Signed)
If your contused rt knee worsens with pain or changes notify us.   Pt couneled on normal healing process of contusion.

## 2014-10-23 NOTE — Progress Notes (Signed)
   Subjective:    Patient ID: [name] [name], female    DOB: 08/05/1933, 81 y.o.   MRN: [id number]  HPI   Pt in states she fell about 8 days ago. Pt has sutures below the eye brow area rt side. Pt was told to remove sutures in 7 days. Pt denies any redness, swelling, or any discharge. No fever, no chills. Pt states she was spraying febreeze in the kitchen. She made floor wet and she slipped. Pt in ED got the sutures. Negative ct of head or neck for acute changes.  Pt in also had contusion to knee. Xray showed degenerative changes but no acute injury.      Review of Systems  Constitutional: Negative for fever, chills, diaphoresis, activity change and fatigue.  Respiratory: Negative for cough, chest tightness and shortness of breath.   Cardiovascular: Negative for chest pain, palpitations and leg swelling.  Gastrointestinal: Negative for nausea, vomiting and abdominal pain.  Musculoskeletal: Negative for neck pain and neck stiffness.       Mild soreness to rt knee. No neck pain.  Skin:       Laceration healing well.  Neurological: Negative for dizziness, tremors, seizures, syncope, facial asymmetry, speech difficulty, weakness, light-headedness, numbness and headaches.  Psychiatric/Behavioral: Negative for behavioral problems, confusion and agitation. The patient is not nervous/anxious.        Objective:   Physical Exam   General Mental Status- Alert. General Appearance- Not in acute distress.   Skin General: Color- Normal Color. Moisture- Normal Moisture. Rt eye brow below. Small horizontal oriented laceration about 1.5 cm. 5 sutures placed. Wound well healed. No redness, warmth or tenderness. No discharge.  Neck Carotid Arteries- Normal color. Moisture- Normal Moisture.No mid cspine pain.  Chest and Lung Exam Auscultation: Breath Sounds:-Normal.  Cardiovascular Auscultation:Rythm- Regular. Murmurs & Other Heart Sounds:Auscultation of the heart reveals- No  Murmurs.     Neurologic Cranial Nerve exam:- CN III-XII intact(No nystagmus), symmetric smile. Romberg Exam:- Negative.  Finger to Nose:- Normal/Intact Strength:- 5/5 equal and symmetric strength both upper and lower extremities.  Rt knee- mild bruising and swelling. Good rom with mild pain.        Assessment & Plan:

## 2014-10-23 NOTE — Patient Instructions (Signed)
For your rt eye brow region laceration, it looks well healed and removed all sutures. Looks well healed enough that no steri strips needed. For next 4 days pat wash and dry this area.  If your contused rt knee worsens with pain or changes notify us.   Follow up as needed and as regularly scheduled with pcp.

## 2014-10-28 DIAGNOSIS — M79674 Pain in right toe(s): Secondary | ICD-10-CM | POA: Diagnosis not present

## 2014-10-28 DIAGNOSIS — B351 Tinea unguium: Secondary | ICD-10-CM | POA: Diagnosis not present

## 2014-10-28 DIAGNOSIS — M79675 Pain in left toe(s): Secondary | ICD-10-CM | POA: Diagnosis not present

## 2014-11-05 ENCOUNTER — Encounter: Payer: Self-pay | Admitting: Family Medicine

## 2014-11-13 DIAGNOSIS — F319 Bipolar disorder, unspecified: Secondary | ICD-10-CM | POA: Diagnosis not present

## 2014-11-29 ENCOUNTER — Other Ambulatory Visit: Payer: Self-pay | Admitting: Family Medicine

## 2014-12-25 DIAGNOSIS — M1711 Unilateral primary osteoarthritis, right knee: Secondary | ICD-10-CM | POA: Diagnosis not present

## 2014-12-26 DIAGNOSIS — B351 Tinea unguium: Secondary | ICD-10-CM | POA: Diagnosis not present

## 2014-12-26 DIAGNOSIS — M79675 Pain in left toe(s): Secondary | ICD-10-CM | POA: Diagnosis not present

## 2014-12-26 DIAGNOSIS — M79674 Pain in right toe(s): Secondary | ICD-10-CM | POA: Diagnosis not present

## 2015-02-05 DIAGNOSIS — M25561 Pain in right knee: Secondary | ICD-10-CM | POA: Diagnosis not present

## 2015-02-05 DIAGNOSIS — M1711 Unilateral primary osteoarthritis, right knee: Secondary | ICD-10-CM | POA: Diagnosis not present

## 2015-02-10 ENCOUNTER — Ambulatory Visit (INDEPENDENT_AMBULATORY_CARE_PROVIDER_SITE_OTHER): Payer: Medicare Other | Admitting: Family Medicine

## 2015-02-10 ENCOUNTER — Encounter: Payer: Self-pay | Admitting: Family Medicine

## 2015-02-10 VITALS — BP 144/83 | HR 88 | Temp 98.3°F | Wt 159.4 lb

## 2015-02-10 DIAGNOSIS — L301 Dyshidrosis [pompholyx]: Secondary | ICD-10-CM | POA: Diagnosis not present

## 2015-02-10 DIAGNOSIS — Z01810 Encounter for preprocedural cardiovascular examination: Secondary | ICD-10-CM | POA: Diagnosis not present

## 2015-02-10 DIAGNOSIS — E039 Hypothyroidism, unspecified: Secondary | ICD-10-CM

## 2015-02-10 DIAGNOSIS — Z136 Encounter for screening for cardiovascular disorders: Secondary | ICD-10-CM | POA: Diagnosis not present

## 2015-02-10 DIAGNOSIS — B3749 Other urogenital candidiasis: Secondary | ICD-10-CM

## 2015-02-10 NOTE — Progress Notes (Signed)
Pre visit review using our clinic review tool, if applicable. No additional management support is needed unless otherwise documented below in the visit note. 

## 2015-02-10 NOTE — Patient Instructions (Signed)
YOU ARE CLEAR TO HAVE SURGERY--- Mar 04, 2015--- WE WILL SEE YOU WHEN YOU ARE FINISHED WITH PT!!!

## 2015-02-10 NOTE — Progress Notes (Signed)
Subjective:    [name] [name] is a 81 y.o. female who presents to the office today for a preoperative consultation at the request of surgeon DR Jean Rosenthal  who plans on performing TOTAL RIGHT KNEE REPLACEMENT on May 17. This consultation is requested for the specific conditions prompting preoperative evaluation (i.e. because of potential affect on operative risk): AGE. Planned anesthesia: general. The patient has the following known anesthesia issues: NONE. Patients bleeding risk: no recent abnormal bleeding. Patient does not have objections to receiving blood products if needed.  The following portions of the patient's history were reviewed and updated as appropriate:  She  has a past medical history of Hypothyroidism; Insomnia; Psoriasis; Eczema; Osteoporosis; Bipolar disorder; Osteoarthritis; and Depression. She  does not have any pertinent problems on file. She  has past surgical history that includes Appendectomy (2003); Abdominal hysterectomy (1982); Tonsillectomy (1970's); Ventral hernia repair (2005); Breast lumpectomy (1947); Cataract extraction (2010); Knee arthroscopy (09/07/11); and Nasal septum surgery (1970's). Her family history includes Aortic aneurysm in her brother; Arthritis in her father; Cancer in her brother, father, and other; Colon cancer in her brother; Heart disease in her brother; Lung cancer in her brother and father; Prostate cancer in her father. She  reports that she quit smoking about 29 years ago. She has never used smokeless tobacco. She reports that she does not drink alcohol or use illicit drugs. She has a current medication list which includes the following prescription(s): ammonium lactate, aripiprazole, b complex vitamins, calcium carbonate, cholecalciferol, b-12, fish oil-omega-3 fatty acids, glucosamine, levothyroxine, lutein, multivitamin, and zolpidem. Current Outpatient Prescriptions on File Prior to Visit  Medication Sig Dispense Refill  .  ammonium lactate (LAC-HYDRIN) 12 % cream Apply topically as needed for dry skin. 385 g 0  . b complex vitamins tablet Take 2 tablets by mouth daily.    . calcium carbonate (OS-CAL) 600 MG TABS Take 600 mg by mouth 2 (two) times daily with a meal.      . cholecalciferol (VITAMIN D) 1000 UNITS tablet Take 2 Units by mouth daily.     . Cyanocobalamin (B-12) 2500 MCG TABS Take 1 tablet by mouth daily.    . fish oil-omega-3 fatty acids 1000 MG capsule Take 1 g by mouth daily.      . Glucosamine 500 MG TABS Take 2 tablets by mouth daily.    Marland Kitchen levothyroxine (SYNTHROID, LEVOTHROID) 75 MCG tablet TAKE 1 TABLET (75 MCG TOTAL) BY MOUTH DAILY BEFORE BREAKFAST. 30 tablet 11  . Lutein 40 MG CAPS Take 1 capsule by mouth daily.      . multivitamin (THERAGRAN) per tablet Take 1 tablet by mouth daily.      Marland Kitchen zolpidem (AMBIEN) 10 MG tablet Take 10 mg by mouth at bedtime as needed.       No current facility-administered medications on file prior to visit.   She is allergic to bupropion hcl..  Review of Systems Pertinent items are noted in HPI.    Objective:    BP 144/83 mmHg  Pulse 88  Temp(Src) 98.3 F (36.8 C) (Oral)  Wt 159 lb 6.4 oz (72.303 kg)  SpO2 95% General appearance: alert, cooperative, appears stated age and no distress Head: Normocephalic, without obvious abnormality, atraumatic Eyes: conjunctivae/corneas clear. PERRL, EOM's intact. Fundi benign. Ears: normal TM's and external ear canals both ears Nose: Nares normal. Septum midline. Mucosa normal. No drainage or sinus tenderness. Throat: lips, mucosa, and tongue normal; teeth and gums normal Neck: no adenopathy, no carotid  bruit, no JVD, supple, symmetrical, trachea midline and thyroid not enlarged, symmetric, no tenderness/mass/nodules Back: symmetric, no curvature. ROM normal. No CVA tenderness. Lungs: clear to auscultation bilaterally Heart: regular rate and rhythm, S1, S2 normal, no murmur, click, rub or gallop Abdomen: soft,  non-tender; bowel sounds normal; no masses,  no organomegaly Extremities: extremities normal, atraumatic, no cyanosis or edema Pulses: 2+ and symmetric Skin: eczema - lower leg(s) bilateral Lymph nodes: Cervical, supraclavicular, and axillary nodes normal. Neurologic: Alert and oriented X 3, normal strength and tone. Normal symmetric reflexes. Normal coordination and gait KNEES---  R KNEE + SWELLING AND TENDER TO PALPATION  Predictors of intubation difficulty:  Morbid obesity? no  Anatomically abnormal facies? no  Prominent incisors? UPPER DENTURES  Receding mandible? no  Short, thick neck? no  Neck range of motion: normal  Mallampati score:   Dentition: dentures--- upper  Cardiographics ECG: POOR R WAVE PROGRESSION OTHER WISE NORMAL Echocardiogram: not done  Imaging Chest x-ray: NOT DONE   Lab Review  No visits with results within 6 Month(s) from this visit. Latest known visit with results is:  Office Visit on 04/08/2014  Component Date Value  . Color, UA 04/08/2014 Yellow   . Clarity, UA 04/08/2014 Clear   . Glucose, UA 04/08/2014 Neg   . Bilirubin, UA 04/08/2014 Neg   . Ketones, UA 04/08/2014 Neg   . Spec Grav, UA 04/08/2014 <=1.005   . Blood, UA 04/08/2014 Trace   . pH, UA 04/08/2014 6.5   . Protein, UA 04/08/2014 Neg   . Urobilinogen, UA 04/08/2014 0.2   . Nitrite, UA 04/08/2014 Positive   . Leukocytes, UA 04/08/2014 large (3+)   . Culture 04/09/2014                     Value:KLEBSIELLA PNEUMONIAE                         PROTEUS MIRABILIS  . Colony Count 04/09/2014 >=100,000 COLONIES/ML   . Organism ID, Bacteria 04/09/2014 KLEBSIELLA PNEUMONIAE   . Organism ID, Bacteria 04/09/2014 PROTEUS MIRABILIS       Assessment:      81 y.o. female with planned surgery as above.   Known risk factors for perioperative complications: None   Difficulty with intubation is not anticipated.  Cardiac Risk Estimation: low  Current medications which may produce withdrawal  symptoms if withheld perioperatively: na      Plan:    1. Preoperative workup as follows ECG, hemoglobin, hematocrit, electrolytes, creatinine, glucose, liver function studies, urinalysis (urinary tract instrumentation planned). 2. Change in medication regimen before surgery: per surgical team. 3. Prophylaxis for cardiac events with perioperative beta-blockers: not indicated. 4. Invasive hemodynamic monitoring perioperatively: not indicated. 5. Deep vein thrombosis prophylaxis postoperatively:regimen to be chosen by surgical team. 6. Surveillance for postoperative MI with ECG immediately postoperatively and on postoperative days 1 and 2 AND troponin levels 24 hours postoperatively and on day 4 or hospital discharge (whichever comes first): at the discretion of anesthesiologist. 7. Pt cleared for surgery

## 2015-02-11 DIAGNOSIS — Z136 Encounter for screening for cardiovascular disorders: Secondary | ICD-10-CM | POA: Diagnosis not present

## 2015-02-11 DIAGNOSIS — L301 Dyshidrosis [pompholyx]: Secondary | ICD-10-CM | POA: Diagnosis not present

## 2015-02-11 DIAGNOSIS — B3749 Other urogenital candidiasis: Secondary | ICD-10-CM | POA: Diagnosis not present

## 2015-02-11 DIAGNOSIS — Z01810 Encounter for preprocedural cardiovascular examination: Secondary | ICD-10-CM | POA: Diagnosis not present

## 2015-02-11 DIAGNOSIS — R350 Frequency of micturition: Secondary | ICD-10-CM | POA: Diagnosis not present

## 2015-02-11 DIAGNOSIS — E039 Hypothyroidism, unspecified: Secondary | ICD-10-CM | POA: Diagnosis not present

## 2015-02-11 LAB — BASIC METABOLIC PANEL
BUN: 10 mg/dL (ref 6–23)
CO2: 29 mEq/L (ref 19–32)
Calcium: 10.2 mg/dL (ref 8.4–10.5)
Chloride: 104 mEq/L (ref 96–112)
Creatinine, Ser: 0.83 mg/dL (ref 0.40–1.20)
GFR: 70.37 mL/min (ref >60.00)
Glucose, Bld: 95 mg/dL (ref 70–99)
Potassium: 3.9 mEq/L (ref 3.5–5.1)
Sodium: 143 mEq/L (ref 135–145)

## 2015-02-11 LAB — CBC WITH DIFFERENTIAL/PLATELET
Basophils Absolute: 0 10*3/uL (ref 0.0–0.1)
Basophils Relative: 0.6 % (ref 0.0–3.0)
Eosinophils Absolute: 0.1 10*3/uL (ref 0.0–0.7)
Eosinophils Relative: 1.7 % (ref 0.0–5.0)
HCT: 42.5 % (ref 36.0–46.0)
Hemoglobin: 14.3 g/dL (ref 12.0–15.0)
Lymphocytes Relative: 33 % (ref 12.0–46.0)
Lymphs Abs: 2.5 10*3/uL (ref 0.7–4.0)
MCHC: 33.7 g/dL (ref 30.0–36.0)
MCV: 91.7 fl (ref 78.0–100.0)
Monocytes Absolute: 0.7 10*3/uL (ref 0.1–1.0)
Monocytes Relative: 10.1 % (ref 3.0–12.0)
Neutro Abs: 4.1 10*3/uL (ref 1.4–7.7)
Neutrophils Relative %: 54.6 % (ref 43.0–77.0)
Platelets: 269 10*3/uL (ref 150.0–400.0)
RBC: 4.64 Mil/uL (ref 3.87–5.11)
RDW: 13.7 % (ref 11.5–15.5)
WBC: 7.4 10*3/uL (ref 4.0–10.5)

## 2015-02-11 LAB — POCT URINALYSIS DIPSTICK
Bilirubin, UA: NEGATIVE
Glucose, UA: NEGATIVE
Ketones, UA: NEGATIVE
Nitrite, UA: NEGATIVE
Protein, UA: NEGATIVE
Spec Grav, UA: 1.02
Urobilinogen, UA: 4
pH, UA: 6

## 2015-02-11 LAB — TSH: TSH: 2.11 u[IU]/mL (ref 0.35–4.50)

## 2015-02-11 LAB — HEPATIC FUNCTION PANEL
ALT: 19 U/L (ref 0–35)
AST: 23 U/L (ref 0–37)
Albumin: 4.5 g/dL (ref 3.5–5.2)
Alkaline Phosphatase: 33 U/L — ABNORMAL LOW (ref 39–117)
Bilirubin, Direct: 0 mg/dL (ref 0.0–0.3)
Total Bilirubin: 0.4 mg/dL (ref 0.2–1.2)
Total Protein: 7.8 g/dL (ref 6.0–8.3)

## 2015-02-11 NOTE — Addendum Note (Signed)
Addended by: Modena Morrow D on: 02/11/2015 05:32 PM   Modules accepted: Orders

## 2015-02-13 LAB — URINE CULTURE: Colony Count: >=100000

## 2015-02-14 DIAGNOSIS — B351 Tinea unguium: Secondary | ICD-10-CM | POA: Diagnosis not present

## 2015-02-14 DIAGNOSIS — M79675 Pain in left toe(s): Secondary | ICD-10-CM | POA: Diagnosis not present

## 2015-02-14 DIAGNOSIS — M79674 Pain in right toe(s): Secondary | ICD-10-CM | POA: Diagnosis not present

## 2015-02-24 ENCOUNTER — Other Ambulatory Visit (HOSPITAL_COMMUNITY): Payer: Self-pay | Admitting: Orthopaedic Surgery

## 2015-02-28 ENCOUNTER — Encounter (HOSPITAL_COMMUNITY)
Admission: RE | Admit: 2015-02-28 | Discharge: 2015-02-28 | Disposition: A | Discharge status: Home / Self Care | Payer: Medicare Other | Source: Ambulatory Visit | Attending: Orthopaedic Surgery | Admitting: Orthopaedic Surgery

## 2015-02-28 ENCOUNTER — Encounter (HOSPITAL_COMMUNITY): Payer: Self-pay

## 2015-02-28 HISTORY — DX: Anemia, unspecified: D64.9

## 2015-02-28 LAB — BASIC METABOLIC PANEL
Anion gap: 12 (ref 5–15)
BUN: 10 mg/dL (ref 6–20)
CO2: 25 mmol/L (ref 22–32)
Calcium: 9.7 mg/dL (ref 8.9–10.3)
Chloride: 103 mmol/L (ref 101–111)
Creatinine, Ser: 0.85 mg/dL (ref 0.44–1.00)
GFR calc Af Amer: >60 mL/min (ref >60)
GFR calc non Af Amer: >60 mL/min (ref >60)
Glucose, Bld: 115 mg/dL — ABNORMAL HIGH (ref 65–99)
Potassium: 3.9 mmol/L (ref 3.5–5.1)
Sodium: 140 mmol/L (ref 135–145)

## 2015-02-28 LAB — CBC
HCT: 42.8 % (ref 36.0–46.0)
Hemoglobin: 14.4 g/dL (ref 12.0–15.0)
MCH: 31.4 pg (ref 26.0–34.0)
MCHC: 33.6 g/dL (ref 30.0–36.0)
MCV: 93.2 fL (ref 78.0–100.0)
Platelets: 277 10*3/uL (ref 150–400)
RBC: 4.59 MIL/uL (ref 3.87–5.11)
RDW: 13.6 % (ref 11.5–15.5)
WBC: 8.2 10*3/uL (ref 4.0–10.5)

## 2015-02-28 LAB — SURGICAL PCR SCREEN
MRSA, PCR: NEGATIVE
Staphylococcus aureus: NEGATIVE

## 2015-02-28 NOTE — Progress Notes (Signed)
Denies any cardiac issues and has never seen a cardiologist.   Does have medical clearance from PCP (inside chart).

## 2015-02-28 NOTE — Pre-Procedure Instructions (Signed)
[name] [name]  02/28/2015   Your procedure is scheduled on:  Tuesday, May 17th   Report to [facility] Admitting at 10:30 AM.   Call this number if you have problems the morning of surgery: [phone]   Remember:   Do not eat food or drink liquids after midnight Monday.   Take these medicines the morning of surgery with A SIP OF WATER: Levothyroxine.              STOP TAKING herbal supplements, vitamins, anti-inflammatories 4-5 days prior to surgery.   Do not wear jewelry, make-up or nail polish.  Do not wear lotions, powders, or perfumes. You may NOT wear deodorant the day of surgery.  Do not shave 48 hours prior to surgery.   Do not bring valuables to the hospital.  [facility] is not responsible for any belongings or valuables.               Contacts, dentures or bridgework may not be worn into surgery.  Leave suitcase in the car. After surgery it may be brought to your room.  For patients admitted to the hospital, discharge time is determined by your treatment team.    Name and phone number of your driver:    Special Instructions: "Preparing for Surgery" instruction sheet.   Please read over the following fact sheets that you were given: Pain Booklet, Coughing and Deep Breathing, MRSA Information and Surgical Site Infection Prevention

## 2015-03-03 MED ORDER — CEFAZOLIN SODIUM-DEXTROSE 2-3 GM-% IV SOLR
2.0000 g | INTRAVENOUS | Status: AC
  Administered 2015-03-04: 2 g via INTRAVENOUS
  Filled 2015-03-03: qty 50

## 2015-03-04 ENCOUNTER — Encounter (HOSPITAL_COMMUNITY)
Admission: RE | Disposition: A | Discharge status: Skilled Nursing Facility | Payer: Self-pay | Source: Ambulatory Visit | Attending: Orthopaedic Surgery

## 2015-03-04 ENCOUNTER — Inpatient Hospital Stay (HOSPITAL_COMMUNITY)
Admission: RE | Admit: 2015-03-04 | Discharge: 2015-03-07 | DRG: 470 | Disposition: A | Discharge status: Skilled Nursing Facility | Payer: Medicare Other | Source: Ambulatory Visit | Attending: Orthopaedic Surgery | Admitting: Orthopaedic Surgery

## 2015-03-04 ENCOUNTER — Inpatient Hospital Stay (HOSPITAL_COMMUNITY): Payer: Medicare Other | Admitting: Anesthesiology

## 2015-03-04 ENCOUNTER — Encounter (HOSPITAL_COMMUNITY): Payer: Self-pay | Admitting: *Deleted

## 2015-03-04 ENCOUNTER — Inpatient Hospital Stay (HOSPITAL_COMMUNITY): Payer: Medicare Other

## 2015-03-04 DIAGNOSIS — M25561 Pain in right knee: Secondary | ICD-10-CM | POA: Diagnosis not present

## 2015-03-04 DIAGNOSIS — Z7902 Long term (current) use of antithrombotics/antiplatelets: Secondary | ICD-10-CM | POA: Diagnosis not present

## 2015-03-04 DIAGNOSIS — Z96651 Presence of right artificial knee joint: Secondary | ICD-10-CM

## 2015-03-04 DIAGNOSIS — Z87891 Personal history of nicotine dependence: Secondary | ICD-10-CM | POA: Diagnosis not present

## 2015-03-04 DIAGNOSIS — Z79899 Other long term (current) drug therapy: Secondary | ICD-10-CM | POA: Diagnosis not present

## 2015-03-04 DIAGNOSIS — F319 Bipolar disorder, unspecified: Secondary | ICD-10-CM | POA: Diagnosis present

## 2015-03-04 DIAGNOSIS — Z8261 Family history of arthritis: Secondary | ICD-10-CM

## 2015-03-04 DIAGNOSIS — M810 Age-related osteoporosis without current pathological fracture: Secondary | ICD-10-CM | POA: Diagnosis present

## 2015-03-04 DIAGNOSIS — Z8249 Family history of ischemic heart disease and other diseases of the circulatory system: Secondary | ICD-10-CM

## 2015-03-04 DIAGNOSIS — R278 Other lack of coordination: Secondary | ICD-10-CM | POA: Diagnosis not present

## 2015-03-04 DIAGNOSIS — F329 Major depressive disorder, single episode, unspecified: Secondary | ICD-10-CM | POA: Diagnosis not present

## 2015-03-04 DIAGNOSIS — L409 Psoriasis, unspecified: Secondary | ICD-10-CM | POA: Diagnosis present

## 2015-03-04 DIAGNOSIS — M1711 Unilateral primary osteoarthritis, right knee: Principal | ICD-10-CM | POA: Diagnosis present

## 2015-03-04 DIAGNOSIS — D62 Acute posthemorrhagic anemia: Secondary | ICD-10-CM | POA: Diagnosis not present

## 2015-03-04 DIAGNOSIS — K589 Irritable bowel syndrome without diarrhea: Secondary | ICD-10-CM | POA: Diagnosis not present

## 2015-03-04 DIAGNOSIS — M6281 Muscle weakness (generalized): Secondary | ICD-10-CM | POA: Diagnosis not present

## 2015-03-04 DIAGNOSIS — E039 Hypothyroidism, unspecified: Secondary | ICD-10-CM | POA: Diagnosis present

## 2015-03-04 DIAGNOSIS — G4700 Insomnia, unspecified: Secondary | ICD-10-CM | POA: Diagnosis present

## 2015-03-04 DIAGNOSIS — M1991 Primary osteoarthritis, unspecified site: Secondary | ICD-10-CM | POA: Diagnosis not present

## 2015-03-04 DIAGNOSIS — Z471 Aftercare following joint replacement surgery: Secondary | ICD-10-CM | POA: Diagnosis not present

## 2015-03-04 DIAGNOSIS — M179 Osteoarthritis of knee, unspecified: Secondary | ICD-10-CM | POA: Diagnosis not present

## 2015-03-04 DIAGNOSIS — R2681 Unsteadiness on feet: Secondary | ICD-10-CM | POA: Diagnosis not present

## 2015-03-04 HISTORY — PX: TOTAL KNEE ARTHROPLASTY: SHX125

## 2015-03-04 SURGERY — ARTHROPLASTY, KNEE, TOTAL
Anesthesia: Monitor Anesthesia Care | Site: Knee | Laterality: Right

## 2015-03-04 MED ORDER — B COMPLEX-C PO TABS
2.0000 | ORAL_TABLET | Freq: Every day | ORAL | Status: DC
  Administered 2015-03-05 – 2015-03-07 (×3): 2 via ORAL
  Filled 2015-03-04 (×3): qty 2

## 2015-03-04 MED ORDER — EPHEDRINE SULFATE 50 MG/ML IJ SOLN
INTRAMUSCULAR | Status: DC | PRN
  Administered 2015-03-04: 10 mg via INTRAVENOUS

## 2015-03-04 MED ORDER — ZOLPIDEM TARTRATE 5 MG PO TABS
5.0000 mg | ORAL_TABLET | Freq: Every evening | ORAL | Status: DC | PRN
  Administered 2015-03-04 – 2015-03-05 (×2): 5 mg via ORAL
  Filled 2015-03-04 (×2): qty 1

## 2015-03-04 MED ORDER — CEFAZOLIN SODIUM 1-5 GM-% IV SOLN
1.0000 g | Freq: Four times a day (QID) | INTRAVENOUS | Status: AC
  Administered 2015-03-04 – 2015-03-05 (×2): 1 g via INTRAVENOUS
  Filled 2015-03-04 (×3): qty 50

## 2015-03-04 MED ORDER — PROMETHAZINE HCL 25 MG/ML IJ SOLN: 6.2500 mg | INTRAMUSCULAR | Status: DC | PRN

## 2015-03-04 MED ORDER — PROPOFOL 10 MG/ML IV BOLUS
INTRAVENOUS | Status: AC
  Filled 2015-03-04: qty 20

## 2015-03-04 MED ORDER — SODIUM CHLORIDE 0.9 % IV SOLN
1000.0000 mg | INTRAVENOUS | Status: AC
  Administered 2015-03-04: 1000 mg via INTRAVENOUS
  Filled 2015-03-04: qty 10

## 2015-03-04 MED ORDER — PHENOL 1.4 % MT LIQD: 1.0000 | OROMUCOSAL | Status: DC | PRN

## 2015-03-04 MED ORDER — B-12 2500 MCG PO TABS: 1.0000 | ORAL_TABLET | Freq: Every day | ORAL | Status: DC

## 2015-03-04 MED ORDER — ONDANSETRON HCL 4 MG/2ML IJ SOLN: 4.0000 mg | Freq: Four times a day (QID) | INTRAMUSCULAR | Status: DC | PRN

## 2015-03-04 MED ORDER — METHOCARBAMOL 500 MG PO TABS
500.0000 mg | ORAL_TABLET | Freq: Four times a day (QID) | ORAL | Status: DC | PRN
  Administered 2015-03-07: 500 mg via ORAL
  Filled 2015-03-04 (×2): qty 1

## 2015-03-04 MED ORDER — PROPOFOL 10 MG/ML IV BOLUS
INTRAVENOUS | Status: DC | PRN
  Administered 2015-03-04: 20 mg via INTRAVENOUS

## 2015-03-04 MED ORDER — DIPHENHYDRAMINE HCL 12.5 MG/5ML PO ELIX: 12.5000 mg | ORAL_SOLUTION | ORAL | Status: DC | PRN

## 2015-03-04 MED ORDER — HYDROMORPHONE HCL 1 MG/ML IJ SOLN
1.0000 mg | INTRAMUSCULAR | Status: DC | PRN
Start: 2015-03-04 — End: 2015-03-07
  Administered 2015-03-05 (×2): 1 mg via INTRAVENOUS
  Filled 2015-03-04 (×2): qty 1

## 2015-03-04 MED ORDER — HYDROMORPHONE HCL 1 MG/ML IJ SOLN
0.2500 mg | INTRAMUSCULAR | Status: DC | PRN
  Administered 2015-03-04 (×4): 0.5 mg via INTRAVENOUS

## 2015-03-04 MED ORDER — FENTANYL CITRATE (PF) 100 MCG/2ML IJ SOLN
INTRAMUSCULAR | Status: DC | PRN
  Administered 2015-03-04 (×2): 50 ug via INTRAVENOUS

## 2015-03-04 MED ORDER — ONDANSETRON HCL 4 MG PO TABS: 4.0000 mg | ORAL_TABLET | Freq: Four times a day (QID) | ORAL | Status: DC | PRN

## 2015-03-04 MED ORDER — OCUVITE-LUTEIN PO CAPS
1.0000 | ORAL_CAPSULE | Freq: Every day | ORAL | Status: DC
Start: 2015-03-05 — End: 2015-03-07
  Administered 2015-03-05 – 2015-03-07 (×3): 1 via ORAL
  Filled 2015-03-04 (×3): qty 1

## 2015-03-04 MED ORDER — PHENYLEPHRINE HCL 10 MG/ML IJ SOLN
10.0000 mg | INTRAMUSCULAR | Status: DC | PRN
  Administered 2015-03-04: 10 ug/min via INTRAVENOUS

## 2015-03-04 MED ORDER — RIVAROXABAN 10 MG PO TABS
10.0000 mg | ORAL_TABLET | Freq: Every day | ORAL | Status: DC
  Administered 2015-03-06 – 2015-03-07 (×2): 10 mg via ORAL
  Filled 2015-03-04 (×3): qty 1

## 2015-03-04 MED ORDER — HYPROMELLOSE 0.3 % OP GEL
1.0000 "application " | Freq: Every day | OPHTHALMIC | Status: DC
  Filled 2015-03-04: qty 3.5

## 2015-03-04 MED ORDER — SODIUM CHLORIDE 0.9 % IJ SOLN
INTRAMUSCULAR | Status: DC | PRN
  Administered 2015-03-04: 40 mL

## 2015-03-04 MED ORDER — DEXTROSE 5 % IV SOLN
500.0000 mg | Freq: Four times a day (QID) | INTRAVENOUS | Status: DC | PRN
  Administered 2015-03-04: 500 mg via INTRAVENOUS
  Filled 2015-03-04 (×2): qty 5

## 2015-03-04 MED ORDER — SODIUM CHLORIDE 0.9 % IV SOLN
INTRAVENOUS | Status: DC
  Administered 2015-03-05 (×2): via INTRAVENOUS

## 2015-03-04 MED ORDER — MIDAZOLAM HCL 2 MG/2ML IJ SOLN
INTRAMUSCULAR | Status: AC
  Filled 2015-03-04: qty 2

## 2015-03-04 MED ORDER — ALUM & MAG HYDROXIDE-SIMETH 200-200-20 MG/5ML PO SUSP: 30.0000 mL | ORAL | Status: DC | PRN

## 2015-03-04 MED ORDER — ACETAMINOPHEN 650 MG RE SUPP: 650.0000 mg | Freq: Four times a day (QID) | RECTAL | Status: DC | PRN

## 2015-03-04 MED ORDER — LEVOTHYROXINE SODIUM 75 MCG PO TABS
75.0000 ug | ORAL_TABLET | Freq: Every day | ORAL | Status: DC
  Administered 2015-03-05 – 2015-03-07 (×3): 75 ug via ORAL
  Filled 2015-03-04: qty 3
  Filled 2015-03-04 (×2): qty 1

## 2015-03-04 MED ORDER — HYDROMORPHONE HCL 1 MG/ML IJ SOLN
INTRAMUSCULAR | Status: AC
  Filled 2015-03-04: qty 1

## 2015-03-04 MED ORDER — OXYCODONE HCL 5 MG PO TABS
5.0000 mg | ORAL_TABLET | ORAL | Status: DC | PRN
  Administered 2015-03-04 – 2015-03-07 (×9): 10 mg via ORAL
  Filled 2015-03-04 (×9): qty 2

## 2015-03-04 MED ORDER — BUPIVACAINE IN DEXTROSE 0.75-8.25 % IT SOLN
INTRATHECAL | Status: DC | PRN
  Administered 2015-03-04: 12 mg via INTRATHECAL

## 2015-03-04 MED ORDER — FENTANYL CITRATE (PF) 250 MCG/5ML IJ SOLN
INTRAMUSCULAR | Status: AC
  Filled 2015-03-04: qty 5

## 2015-03-04 MED ORDER — PROPOFOL INFUSION 10 MG/ML OPTIME
INTRAVENOUS | Status: DC | PRN
  Administered 2015-03-04: 25 ug/kg/min via INTRAVENOUS

## 2015-03-04 MED ORDER — LACTATED RINGERS IV SOLN
INTRAVENOUS | Status: DC
  Administered 2015-03-04 (×3): via INTRAVENOUS

## 2015-03-04 MED ORDER — BUPIVACAINE LIPOSOME 1.3 % IJ SUSP
20.0000 mL | INTRAMUSCULAR | Status: AC
  Administered 2015-03-04: 20 mL
  Filled 2015-03-04: qty 20

## 2015-03-04 MED ORDER — ADULT MULTIVITAMIN W/MINERALS CH
1.0000 | ORAL_TABLET | Freq: Every day | ORAL | Status: DC
  Administered 2015-03-05 – 2015-03-07 (×3): 1 via ORAL
  Filled 2015-03-04 (×6): qty 1

## 2015-03-04 MED ORDER — SODIUM CHLORIDE 0.9 % IR SOLN
Status: DC | PRN
  Administered 2015-03-04: 1000 mL

## 2015-03-04 MED ORDER — METOCLOPRAMIDE HCL 5 MG PO TABS
5.0000 mg | ORAL_TABLET | Freq: Three times a day (TID) | ORAL | Status: DC | PRN
Start: 2015-03-04 — End: 2015-03-07

## 2015-03-04 MED ORDER — METOCLOPRAMIDE HCL 5 MG/ML IJ SOLN: 5.0000 mg | Freq: Three times a day (TID) | INTRAMUSCULAR | Status: DC | PRN

## 2015-03-04 MED ORDER — ACETAMINOPHEN 325 MG PO TABS
650.0000 mg | ORAL_TABLET | Freq: Four times a day (QID) | ORAL | Status: DC | PRN
Start: 2015-03-04 — End: 2015-03-07

## 2015-03-04 MED ORDER — VITAMIN D 1000 UNITS PO TABS
2000.0000 [IU] | ORAL_TABLET | Freq: Every day | ORAL | Status: DC
  Administered 2015-03-05 – 2015-03-07 (×3): 2000 [IU] via ORAL
  Filled 2015-03-04 (×3): qty 2

## 2015-03-04 MED ORDER — ARIPIPRAZOLE 5 MG PO TABS
5.0000 mg | ORAL_TABLET | Freq: Every day | ORAL | Status: DC
  Administered 2015-03-05 – 2015-03-07 (×3): 5 mg via ORAL
  Filled 2015-03-04 (×3): qty 1

## 2015-03-04 MED ORDER — DOCUSATE SODIUM 100 MG PO CAPS
100.0000 mg | ORAL_CAPSULE | Freq: Two times a day (BID) | ORAL | Status: DC
  Administered 2015-03-04 – 2015-03-07 (×6): 100 mg via ORAL
  Filled 2015-03-04 (×7): qty 1

## 2015-03-04 MED ORDER — FENTANYL CITRATE (PF) 100 MCG/2ML IJ SOLN
INTRAMUSCULAR | Status: AC
  Filled 2015-03-04: qty 2

## 2015-03-04 MED ORDER — VITAMIN B-12 1000 MCG PO TABS
2500.0000 ug | ORAL_TABLET | Freq: Every day | ORAL | Status: DC
  Administered 2015-03-05 – 2015-03-07 (×3): 2500 ug via ORAL
  Filled 2015-03-04: qty 3
  Filled 2015-03-04: qty 2.5
  Filled 2015-03-04: qty 3

## 2015-03-04 MED ORDER — ONDANSETRON HCL 4 MG/2ML IJ SOLN
INTRAMUSCULAR | Status: DC | PRN
  Administered 2015-03-04: 4 mg via INTRAVENOUS

## 2015-03-04 MED ORDER — MENTHOL 3 MG MT LOZG: 1.0000 | LOZENGE | OROMUCOSAL | Status: DC | PRN

## 2015-03-04 SURGICAL SUPPLY — 65 items
BANDAGE ELASTIC 6 VELCRO ST LF (GAUZE/BANDAGES/DRESSINGS) ×3 IMPLANT
BANDAGE ESMARK 6X9 LF (GAUZE/BANDAGES/DRESSINGS) ×1 IMPLANT
BLADE SAG 18X100X1.27 (BLADE) ×2 IMPLANT
BNDG CMPR 9X6 STRL LF SNTH (GAUZE/BANDAGES/DRESSINGS) ×1
BNDG ESMARK 6X9 LF (GAUZE/BANDAGES/DRESSINGS) ×2
BOWL SMART MIX CTS (DISPOSABLE) IMPLANT
CAPT KNEE TOTAL 3 ×1 IMPLANT
CEMENT BONE SIMPLEX SPEEDSET (Cement) ×1 IMPLANT
CLSR STERI-STRIP ANTIMIC 1/2X4 (GAUZE/BANDAGES/DRESSINGS) ×1 IMPLANT
COVER SURGICAL LIGHT HANDLE (MISCELLANEOUS) ×2 IMPLANT
CUFF TOURNIQUET SINGLE 34IN LL (TOURNIQUET CUFF) ×2 IMPLANT
CUFF TOURNIQUET SINGLE 44IN (TOURNIQUET CUFF) IMPLANT
DRAPE IMP U-DRAPE 54X76 (DRAPES) ×2 IMPLANT
DRAPE INCISE IOBAN 66X45 STRL (DRAPES) IMPLANT
DRAPE ORTHO SPLIT 77X108 STRL (DRAPES) ×4
DRAPE PROXIMA HALF (DRAPES) ×2 IMPLANT
DRAPE SURG ORHT 6 SPLT 77X108 (DRAPES) ×2 IMPLANT
DRAPE U-SHAPE 47X51 STRL (DRAPES) ×2 IMPLANT
DRSG PAD ABDOMINAL 8X10 ST (GAUZE/BANDAGES/DRESSINGS) ×2 IMPLANT
DURAPREP 26ML APPLICATOR (WOUND CARE) ×2 IMPLANT
ELECT REM PT RETURN 9FT ADLT (ELECTROSURGICAL) ×2
ELECTRODE REM PT RTRN 9FT ADLT (ELECTROSURGICAL) ×1 IMPLANT
EVACUATOR 1/8 PVC DRAIN (DRAIN) IMPLANT
FACESHIELD WRAPAROUND (MASK) ×6 IMPLANT
FACESHIELD WRAPAROUND OR TEAM (MASK) ×3 IMPLANT
GAUZE SPONGE 4X4 12PLY STRL (GAUZE/BANDAGES/DRESSINGS) ×2 IMPLANT
GAUZE XEROFORM 1X8 LF (GAUZE/BANDAGES/DRESSINGS) ×2 IMPLANT
GLOVE BIO SURGEON STRL SZ8 (GLOVE) ×4 IMPLANT
GLOVE BIOGEL PI IND STRL 8 (GLOVE) ×1 IMPLANT
GLOVE BIOGEL PI INDICATOR 8 (GLOVE) ×1
GLOVE ECLIPSE 7.0 STRL STRAW (GLOVE) ×2 IMPLANT
GLOVE ORTHO TXT STRL SZ7.5 (GLOVE) ×2 IMPLANT
GOWN STRL REUS W/ TWL LRG LVL3 (GOWN DISPOSABLE) ×2 IMPLANT
GOWN STRL REUS W/ TWL XL LVL3 (GOWN DISPOSABLE) ×4 IMPLANT
GOWN STRL REUS W/TWL LRG LVL3 (GOWN DISPOSABLE) ×4
GOWN STRL REUS W/TWL XL LVL3 (GOWN DISPOSABLE) ×8
HANDPIECE INTERPULSE COAX TIP (DISPOSABLE) ×2
IMMOBILIZER KNEE 22 UNIV (SOFTGOODS) ×1 IMPLANT
KIT BASIN OR (CUSTOM PROCEDURE TRAY) ×2 IMPLANT
KIT ROOM TURNOVER OR (KITS) ×2 IMPLANT
MANIFOLD NEPTUNE II (INSTRUMENTS) ×2 IMPLANT
NEEDLE HYPO 22GX1.5 SAFETY (NEEDLE) ×1 IMPLANT
NS IRRIG 1000ML POUR BTL (IV SOLUTION) ×2 IMPLANT
PACK TOTAL JOINT (CUSTOM PROCEDURE TRAY) ×2 IMPLANT
PACK UNIVERSAL I (CUSTOM PROCEDURE TRAY) ×2 IMPLANT
PAD ARMBOARD 7.5X6 YLW CONV (MISCELLANEOUS) ×4 IMPLANT
PADDING CAST COTTON 6X4 STRL (CAST SUPPLIES) ×2 IMPLANT
SET HNDPC FAN SPRY TIP SCT (DISPOSABLE) IMPLANT
SET PAD KNEE POSITIONER (MISCELLANEOUS) ×2 IMPLANT
STAPLER VISISTAT 35W (STAPLE) IMPLANT
STRIP CLOSURE SKIN 1/2X4 (GAUZE/BANDAGES/DRESSINGS) IMPLANT
SUCTION FRAZIER TIP 10 FR DISP (SUCTIONS) ×1 IMPLANT
SUT MNCRL AB 4-0 PS2 18 (SUTURE) IMPLANT
SUT VIC AB 0 CT1 27 (SUTURE) ×2
SUT VIC AB 0 CT1 27XBRD ANBCTR (SUTURE) ×1 IMPLANT
SUT VIC AB 1 CT1 27 (SUTURE) ×4
SUT VIC AB 1 CT1 27XBRD ANBCTR (SUTURE) ×2 IMPLANT
SUT VIC AB 2-0 CT1 27 (SUTURE) ×4
SUT VIC AB 2-0 CT1 TAPERPNT 27 (SUTURE) ×2 IMPLANT
SYR 50ML LL SCALE MARK (SYRINGE) ×1 IMPLANT
TOWEL OR 17X24 6PK STRL BLUE (TOWEL DISPOSABLE) ×2 IMPLANT
TOWEL OR 17X26 10 PK STRL BLUE (TOWEL DISPOSABLE) ×2 IMPLANT
TRAY FOLEY CATH 16FRSI W/METER (SET/KITS/TRAYS/PACK) ×1 IMPLANT
WATER STERILE IRR 1000ML POUR (IV SOLUTION) ×4 IMPLANT
WRAP KNEE MAXI GEL POST OP (GAUZE/BANDAGES/DRESSINGS) ×2 IMPLANT

## 2015-03-04 NOTE — Transfer of Care (Signed)
Immediate Anesthesia Transfer of Care Note  Patient: [name] [name]  Procedure(s) Performed: Procedure(s): RIGHT TOTAL KNEE ARTHROPLASTY (Right)  Patient Location: PACU  Anesthesia Type:MAC and Spinal  Level of Consciousness: awake, alert  and oriented  Airway & Oxygen Therapy: Patient Spontanous Breathing and Patient connected to nasal cannula oxygen  Post-op Assessment: Report given to RN and Post -op Vital signs reviewed and stable  Post vital signs: Reviewed and stable  Last Vitals:  Filed Vitals:   03/04/15 1052  BP: 175/75  Pulse: 75  Temp: 36.7 C  Resp: 20    Complications: No apparent anesthesia complications

## 2015-03-04 NOTE — Anesthesia Procedure Notes (Signed)
Spinal Patient location during procedure: OR Start time: 03/04/2015 12:23 PM End time: 03/04/2015 12:32 PM Staffing Anesthesiologist: Duane Boston Performed by: anesthesiologist  Preanesthetic Checklist Completed: patient identified, surgical consent, pre-op evaluation, timeout performed, IV checked, risks and benefits discussed and monitors and equipment checked Spinal Block Patient position: sitting Prep: Betadine Patient monitoring: cardiac monitor, continuous pulse ox and blood pressure Approach: midline Location: L2-3 Injection technique: single-shot Needle Needle type: Pencan  Needle gauge: 24 G Needle length: 9 cm Additional Notes Functioning IV was confirmed and monitors were applied. Sterile prep and drape, including hand hygiene and sterile gloves were used. The patient was positioned and the spine was prepped. The skin was anesthetized with lidocaine.  Free flow of clear CSF was obtained prior to injecting local anesthetic into the CSF.  The spinal needle aspirated freely following injection.  The needle was carefully withdrawn.  The patient tolerated the procedure well.

## 2015-03-04 NOTE — H&P (Signed)
TOTAL KNEE ADMISSION H&P  Patient is being admitted for right total knee arthroplasty.  Subjective:  Chief Complaint:right knee pain.  HPI: [name] [name], 81 y.o. female, has a history of pain and functional disability in the right knee due to arthritis and has failed non-surgical conservative treatments for greater than 12 weeks to includeNSAID's and/or analgesics, corticosteriod injections, viscosupplementation injections, flexibility and strengthening excercises, use of assistive devices and activity modification.  Onset of symptoms was gradual, starting 3 years ago with gradually worsening course since that time. The patient noted no past surgery on the right knee(s).  Patient currently rates pain in the right knee(s) at 10 out of 10 with activity. Patient has night pain, worsening of pain with activity and weight bearing, pain that interferes with activities of daily living, pain with passive range of motion, crepitus and joint swelling.  Patient has evidence of subchondral cysts, subchondral sclerosis, periarticular osteophytes and joint space narrowing by imaging studies. There is no active infection.  Patient Active Problem List   Diagnosis Date Noted  . Osteoarthritis of right knee 03/04/2015  . Visit for suture removal 10/23/2014  . Contusion of knee, right 10/23/2014  . IBS 02/23/2010  . HEMORRHOIDS, EXTERNAL 01/01/2010  . HEMATURIA UNSPECIFIED 11/04/2009  . NAUSEA 06/02/2009  . URINARY INCONTINENCE, STRESS, FEMALE 05/09/2009  . SINUSITIS- ACUTE-NOS 11/25/2008  . UTI 04/23/2008  . COLONIC POLYPS 12/21/2007  . BIPOLAR DISORDER UNSPECIFIED 12/21/2007  . DEVIATED NASAL SEPTUM 12/21/2007  . HERNIA, VENTRAL 12/21/2007  . OSTEOARTHRITIS 12/21/2007  . HYPOTHYROIDISM 09/01/2007  . ECZEMA 09/01/2007  . PSORIASIS 09/01/2007  . OSTEOPOROSIS 09/01/2007  . INSOMNIA 09/01/2007   Past Medical History  Diagnosis Date  . Hypothyroidism   . Insomnia   . Psoriasis   . Eczema   .  Osteoporosis   . Bipolar disorder   . Osteoarthritis   . Depression     bipolar  . Anemia     h/o fibroids    Past Surgical History  Procedure Laterality Date  . Appendectomy  2003  . Abdominal hysterectomy  1982  . Tonsillectomy  1970's  . Ventral hernia repair  2005  . Breast lumpectomy  1947    left  . Cataract extraction  2010  . Knee arthroscopy  09/07/11    right knee  . Nasal septum surgery  1970's  . Tonsillectomy    . Eye surgery    . Hernia repair      Prescriptions prior to admission  Medication Sig Dispense Refill Last Dose  . ARIPiprazole (ABILIFY) 5 MG tablet Take 1 tablet by mouth daily.   03/03/2015 at Unknown time  . b complex vitamins tablet Take 2 tablets by mouth daily.   Past Week at Unknown time  . calcium carbonate (OS-CAL) 600 MG TABS Take 600 mg by mouth 2 (two) times daily with a meal.     Past Week at Unknown time  . cholecalciferol (VITAMIN D) 1000 UNITS tablet Take 2,000 Units by mouth daily.    Past Week at Unknown time  . Cyanocobalamin (B-12) 2500 MCG TABS Take 1 tablet by mouth daily.   Past Week at Unknown time  . fish oil-omega-3 fatty acids 1000 MG capsule Take 1 g by mouth daily.     Past Week at Unknown time  . Glucosamine 500 MG TABS Take 1,000 mg by mouth daily.    Past Week at Unknown time  . hypromellose (GENTEAL) 0.3 % GEL ophthalmic ointment Place 1 application into  both eyes at bedtime.   03/03/2015 at Unknown time  . levothyroxine (SYNTHROID, LEVOTHROID) 75 MCG tablet TAKE 1 TABLET (75 MCG TOTAL) BY MOUTH DAILY BEFORE BREAKFAST. 30 tablet 11 03/04/2015 at 0830  . Lutein 40 MG CAPS Take 40 mg by mouth daily.    Past Week at Unknown time  . multivitamin (THERAGRAN) per tablet Take 1 tablet by mouth daily.     Past Week at Unknown time  . naproxen sodium (ANAPROX) 220 MG tablet Take 440 mg by mouth 2 (two) times daily as needed (pain).   Past Week at Unknown time  . zolpidem (AMBIEN) 10 MG tablet Take 10 mg by mouth at bedtime.     03/03/2015 at Unknown time  . ammonium lactate (LAC-HYDRIN) 12 % cream Apply topically as needed for dry skin. (Patient not taking: Reported on 02/26/2015) 385 g 0 Not Taking at Unknown time   Allergies  Allergen Reactions  . Bupropion Hcl Rash    History  Substance Use Topics  . Smoking status: Former Smoker    Quit date: 09/29/1985  . Smokeless tobacco: Never Used  . Alcohol Use: No    Family History  Problem Relation Age of Onset  . Colon cancer Brother   . Lung cancer Brother   . Lung cancer Father   . Prostate cancer Father   . Arthritis Father   . Cancer Father     lung, prostate  . Heart disease Brother   . Aortic aneurysm Brother   . Cancer Brother     lung  . Cancer Other     colon     Review of Systems  Musculoskeletal: Positive for joint pain.  All other systems reviewed and are negative.   Objective:  Physical Exam  Constitutional: She is oriented to person, place, and time. She appears well-developed and well-nourished.  HENT:  Head: Normocephalic and atraumatic.  Eyes: EOM are normal. Pupils are equal, round, and reactive to light.  Neck: Normal range of motion. Neck supple.  Cardiovascular: Normal rate and regular rhythm.   Respiratory: Effort normal and breath sounds normal.  GI: Soft. Bowel sounds are normal.  Musculoskeletal:       Right knee: She exhibits decreased range of motion, swelling and effusion. Tenderness found. Medial joint line and lateral joint line tenderness noted.  Neurological: She is alert and oriented to person, place, and time.  Skin: Skin is warm and dry.  Psychiatric: She has a normal mood and affect.    Vital signs in last 24 hours: Temp:  [98.1 F (36.7 C)] 98.1 F (36.7 C) (05/17 1052) Pulse Rate:  [75] 75 (05/17 1052) Resp:  [20] 20 (05/17 1052) BP: (175)/(75) 175/75 mmHg (05/17 1052) SpO2:  [99 %] 99 % (05/17 1052) Weight:  [72.122 kg (159 lb)-72.53 kg (159 lb 14.4 oz)] 72.122 kg (159 lb) (05/17  1052)  Labs:   Estimated body mass index is 25.68 kg/(m^2) as calculated from the following:   Height as of this encounter: 5\' 6"  (1.676 m).   Weight as of this encounter: 72.122 kg (159 lb).   Imaging Review Plain radiographs demonstrate severe degenerative joint disease of the right knee(s). The overall alignment ismild varus. The bone quality appears to be good for age and reported activity level.  Assessment/Plan:  End stage arthritis, right knee   The patient history, physical examination, clinical judgment of the provider and imaging studies are consistent with end stage degenerative joint disease of the right knee(s) and  total knee arthroplasty is deemed medically necessary. The treatment options including medical management, injection therapy arthroscopy and arthroplasty were discussed at length. The risks and benefits of total knee arthroplasty were presented and reviewed. The risks due to aseptic loosening, infection, stiffness, patella tracking problems, thromboembolic complications and other imponderables were discussed. The patient acknowledged the explanation, agreed to proceed with the plan and consent was signed. Patient is being admitted for inpatient treatment for surgery, pain control, PT, OT, prophylactic antibiotics, VTE prophylaxis, progressive ambulation and ADL's and discharge planning. The patient is planning to be discharged to skilled nursing facility

## 2015-03-04 NOTE — Brief Op Note (Signed)
03/04/2015  2:11 PM  PATIENT:  [name] [name]  81 y.o. female  PRE-OPERATIVE DIAGNOSIS:  Primary osteoarthritis right knee  POST-OPERATIVE DIAGNOSIS:  Primary osteoarthritis right knee  PROCEDURE:  Procedure(s): RIGHT TOTAL KNEE ARTHROPLASTY (Right)  SURGEON:  Surgeon(s) and Role:    * Mcarthur Rossetti, MD - Primary  PHYSICIAN ASSISTANT: Benita Stabile, PA-C  ANESTHESIA:   local and spinal  EBL:  Total I/O In: -  Out: 175 [Urine:175]  BLOOD ADMINISTERED:none  DRAINS: none   LOCAL MEDICATIONS USED:  OTHER Experil  SPECIMEN:  No Specimen  DISPOSITION OF SPECIMEN:  N/A  COUNTS:  YES  TOURNIQUET:   Total Tourniquet Time Documented: Thigh (Right) - 62 minutes Total: Thigh (Right) - 62 minutes   DICTATION: .Other Dictation: Dictation Number [id number]  PLAN OF CARE: Admit to inpatient   PATIENT DISPOSITION:  PACU - hemodynamically stable.   Delay start of Pharmacological VTE agent (>24hrs) due to surgical blood loss or risk of bleeding: no

## 2015-03-04 NOTE — Anesthesia Postprocedure Evaluation (Signed)
  Anesthesia Post-op Note  Patient: Emilya A Etzler  Procedure(s) Performed: Procedure(s): RIGHT TOTAL KNEE ARTHROPLASTY (Right)  Patient Location: PACU  Anesthesia Type: Spinal/MAC  Level of Consciousness: awake and alert   Airway and Oxygen Therapy: Patient Spontanous Breathing  Post-op Pain: none  Post-op Assessment: Post-op Vital signs reviewed, Patient's Cardiovascular Status Stable and Respiratory Function Stable. No residual motor block.  Post-op Vital Signs: Reviewed  Filed Vitals:   03/04/15 1518  BP: 98/68  Pulse: 80  Temp:   Resp: 17    Complications: No apparent anesthesia complications

## 2015-03-04 NOTE — Anesthesia Preprocedure Evaluation (Signed)
Anesthesia Evaluation  Patient identified by MRN, date of birth, ID band Patient awake    Reviewed: Allergy & Precautions, NPO status , Patient's Chart, lab work & pertinent test results  History of Anesthesia Complications Negative for: history of anesthetic complications  Airway Mallampati: II  TM Distance: >3 FB Neck ROM: Full    Dental  (+) Teeth Intact, Dental Advisory Given   Pulmonary former smoker,    Pulmonary exam normal       Cardiovascular negative cardio ROS Normal cardiovascular exam    Neuro/Psych PSYCHIATRIC DISORDERS Depression Bipolar Disorder negative neurological ROS     GI/Hepatic negative GI ROS, Neg liver ROS,   Endo/Other  Hypothyroidism   Renal/GU negative Renal ROS     Musculoskeletal   Abdominal   Peds  Hematology   Anesthesia Other Findings   Reproductive/Obstetrics                             Anesthesia Physical Anesthesia Plan  ASA: III  Anesthesia Plan: MAC and Spinal   Post-op Pain Management:    Induction: Intravenous  Airway Management Planned: Simple Face Mask  Additional Equipment:   Intra-op Plan:   Post-operative Plan:   Informed Consent: I have reviewed the patients History and Physical, chart, labs and discussed the procedure including the risks, benefits and alternatives for the proposed anesthesia with the patient or authorized representative who has indicated his/her understanding and acceptance.   Dental advisory given  Plan Discussed with: CRNA, Anesthesiologist and Surgeon  Anesthesia Plan Comments:         Anesthesia Quick Evaluation

## 2015-03-04 NOTE — Progress Notes (Signed)
Orthopedic Tech Progress Note Patient Details:  [name] [name] 24-Mar-1933 [id number] CPM applied to RLE with appropriate settings. No OHFs available at this time. Patient to receive OHF when one becomes available, should she need it.  CPM Right Knee CPM Right Knee: On Right Knee Flexion (Degrees): 90 Right Knee Extension (Degrees): 0   Asia R Thompson 03/04/2015, 3:13 PM

## 2015-03-05 ENCOUNTER — Encounter (HOSPITAL_COMMUNITY): Payer: Self-pay | Admitting: Orthopaedic Surgery

## 2015-03-05 LAB — CBC
HCT: 35.9 % — ABNORMAL LOW (ref 36.0–46.0)
Hemoglobin: 11.7 g/dL — ABNORMAL LOW (ref 12.0–15.0)
MCH: 30.4 pg (ref 26.0–34.0)
MCHC: 32.6 g/dL (ref 30.0–36.0)
MCV: 93.2 fL (ref 78.0–100.0)
Platelets: 231 10*3/uL (ref 150–400)
RBC: 3.85 MIL/uL — ABNORMAL LOW (ref 3.87–5.11)
RDW: 13.7 % (ref 11.5–15.5)
WBC: 12.2 10*3/uL — ABNORMAL HIGH (ref 4.0–10.5)

## 2015-03-05 LAB — BASIC METABOLIC PANEL
Anion gap: 10 (ref 5–15)
BUN: 10 mg/dL (ref 6–20)
CO2: 29 mmol/L (ref 22–32)
Calcium: 8.8 mg/dL — ABNORMAL LOW (ref 8.9–10.3)
Chloride: 99 mmol/L — ABNORMAL LOW (ref 101–111)
Creatinine, Ser: 1.04 mg/dL — ABNORMAL HIGH (ref 0.44–1.00)
GFR calc Af Amer: 58 mL/min — ABNORMAL LOW (ref >60)
GFR calc non Af Amer: 50 mL/min — ABNORMAL LOW (ref >60)
Glucose, Bld: 137 mg/dL — ABNORMAL HIGH (ref 65–99)
Potassium: 3.9 mmol/L (ref 3.5–5.1)
Sodium: 138 mmol/L (ref 135–145)

## 2015-03-05 NOTE — Evaluation (Signed)
Occupational Therapy Evaluation Patient Details Name: [name] [name] MRN: [id number] DOB: 1933-03-11 Today's Date: 03/05/2015    History of Present Illness Patient is a 81 y/o female s/p R TKA. PMH of bipolar disorder, depression, anxiety, hypothyroidism.   Clinical Impression   PTA pt lived at home alone and was independent with ADLs. Pt currently requires mod A +2 for functional mobility due to pain, decreased mobility, and lethargy. Pt requires assist for LB ADLs. All further OT needs will be met in next venue of care (SNF).     Follow Up Recommendations  SNF;Supervision/Assistance - 24 hour    Equipment Recommendations  Other (comment) (defer to SNF)    Recommendations for Other Services       Precautions / Restrictions Precautions Precautions: Knee Precaution Booklet Issued: No Precaution Comments: Reviewed no pillow under knee. Restrictions Weight Bearing Restrictions: Yes RLE Weight Bearing: Weight bearing as tolerated      Mobility Bed Mobility Overal bed mobility: Needs Assistance Bed Mobility: Supine to Sit     Supine to sit: Mod assist     General bed mobility comments: Mod A to progress LEs to EOB and rotate hips. Pt able to use UEs to pull to sitting.   Transfers Overall transfer level: Needs assistance Equipment used: Rolling walker (2 wheeled) Transfers: Sit to/from [facility] Sit to Stand: Mod assist;+2 physical assistance Stand pivot transfers: Mod assist;+2 physical assistance       General transfer comment: Stood from EOB x1, from [facility] x1 with cues for hand placement and anterior translation. Difficulty transitioning UEs to RW- posterior lean. Manual cues for upright posture.    Balance Overall balance assessment: Needs assistance Sitting-balance support: Feet supported;Single extremity supported Sitting balance-Leahy Scale: Fair     Standing balance support: During functional activity Standing balance-Leahy Scale:  Poor Standing balance comment: Relient on RW for support.                            ADL Overall ADL's : Needs assistance/impaired Eating/Feeding: Independent;Sitting   Grooming: Set up;Sitting   Upper Body Bathing: Set up;Sitting   Lower Body Bathing: Maximal assistance;Sit to/from stand;+2 for physical assistance   Upper Body Dressing : Set up;Sitting   Lower Body Dressing: Maximal assistance;+2 for physical assistance;Sit to/from stand   Toilet Transfer: Moderate assistance;+2 for physical assistance;Stand-pivot;RW;BSC   Toileting- Clothing Manipulation and Hygiene: Maximal assistance;+2 for physical assistance;Sit to/from stand       Functional mobility during ADLs: Moderate assistance;+2 for physical assistance;Rolling walker General ADL Comments: Pt limited by lethargy and decreased ROM     Vision Additional Comments: No change from baseline          Pertinent Vitals/Pain Pain Assessment: No/denies pain     Hand Dominance Right   Extremity/Trunk Assessment Upper Extremity Assessment Upper Extremity Assessment: Generalized weakness   Lower Extremity Assessment Lower Extremity Assessment: Defer to PT evaluation RLE Deficits / Details: Grossly limited AROM hip,knee secondary to pain.       Communication Communication Communication: No difficulties   Cognition Arousal/Alertness: Lethargic;Suspect due to medications Behavior During Therapy: [facility] for tasks assessed/performed Overall Cognitive Status: Within Functional Limits for tasks assessed                                Home Living Family/patient expects to be discharged to:: Skilled nursing facility Living Arrangements: Alone  Prior Functioning/Environment Level of Independence: Independent             OT Diagnosis: Generalized weakness;Acute pain                       Co-evaluation PT/OT/SLP  Co-Evaluation/Treatment: Yes Reason for Co-Treatment: For patient/therapist safety PT goals addressed during session: Mobility/safety with mobility;Strengthening/ROM;Proper use of DME OT goals addressed during session: ADL's and self-care      End of Session Equipment Utilized During Treatment: Gait belt;Rolling walker CPM Right Knee CPM Right Knee: Off  Activity Tolerance: Patient tolerated treatment well Patient left: in chair;with call bell/phone within reach   Time: 1138-1214 OT Time Calculation (min): 36 min Charges:  OT General Charges $OT Visit: 1 Procedure OT Evaluation $Initial OT Evaluation Tier I: 1 Procedure G-Codes:    Juluis Rainier Mar 15, 2015, 2:28 PM  Cyndie Chime, OTR/L Occupational Therapist [phone] (pager)

## 2015-03-05 NOTE — Plan of Care (Signed)
Problem: Consults Goal: Diagnosis- Total Joint Replacement Primary Total Knee     

## 2015-03-05 NOTE — Discharge Instructions (Addendum)
Information on my medicine - XARELTO (Rivaroxaban)  This medication education was reviewed with me or my healthcare representative as part of my discharge preparation.  The pharmacist that spoke with me during my hospital stay was:  Lavenia Atlas, [facility]  Why was Xarelto prescribed for you? Xarelto was prescribed for you to reduce the risk of blood clots forming after orthopedic surgery. The medical term for these abnormal blood clots is venous thromboembolism (VTE).  What do you need to know about xarelto ? Take your Xarelto ONCE DAILY at the same time every day. You may take it either with or without food.  If you have difficulty swallowing the tablet whole, you may crush it and mix in applesauce just prior to taking your dose.  Take Xarelto exactly as prescribed by your doctor and DO NOT stop taking Xarelto without talking to the doctor who prescribed the medication.  Stopping without other VTE prevention medication to take the place of Xarelto may increase your risk of developing a clot.  After discharge, you should have regular check-up appointments with your healthcare provider that is prescribing your Xarelto.    What do you do if you miss a dose? If you miss a dose, take it as soon as you remember on the same day then continue your regularly scheduled once daily regimen the next day. Do not take two doses of Xarelto on the same day.   Important Safety Information A possible side effect of Xarelto is bleeding. You should call your healthcare provider right away if you experience any of the following: ? Bleeding from an injury or your nose that does not stop. ? Unusual colored urine (red or dark brown) or unusual colored stools (red or black). ? Unusual bruising for unknown reasons. ? A serious fall or if you hit your head (even if there is no bleeding).  Some medicines may interact with Xarelto and might increase your risk of bleeding while on Xarelto. To help  avoid this, consult your healthcare provider or pharmacist prior to using any new prescription or non-prescription medications, including herbals, vitamins, non-steroidal anti-inflammatory drugs (NSAIDs) and supplements.  This website has more information on Xarelto: [email or web address]-benson.[email or web address]/.  INSTRUCTIONS AFTER JOINT REPLACEMENT   o Remove items at home which could result in a fall. This includes throw rugs or furniture in walking pathways o ICE to the affected joint every three hours while awake for 30 minutes at a time, for at least the first 3-5 days, and then as needed for pain and swelling.  Continue to use ice for pain and swelling. You may notice swelling that will progress down to the foot and ankle.  This is normal after surgery.  Elevate your leg when you are not up walking on it.   o Continue to use the breathing machine you got in the hospital (incentive spirometer) which will help keep your temperature down.  It is common for your temperature to cycle up and down following surgery, especially at night when you are not up moving around and exerting yourself.  The breathing machine keeps your lungs expanded and your temperature down.   DIET:  As you were doing prior to hospitalization, we recommend a well-balanced diet.  DRESSING / WOUND CARE / SHOWERING  Keep the surgical dressing until follow up.  The dressing is water proof, so you can shower without any extra covering.  IF THE DRESSING FALLS OFF or the wound gets wet inside, change the dressing with sterile gauze.  Please use good hand washing techniques before changing the dressing.  Do not use any lotions or creams on the incision until instructed by your surgeon.    ACTIVITY  o Increase activity slowly as tolerated, but follow the weight bearing instructions below.   o No driving for 6 weeks or until further direction given by your physician.  You cannot drive while taking narcotics.  o No lifting or carrying greater than 10 lbs.  until further directed by your surgeon. o Avoid periods of inactivity such as sitting longer than an hour when not asleep. This helps prevent blood clots.  o You may return to work once you are authorized by your doctor.     WEIGHT BEARING   Weight bearing as tolerated with assist device (walker, cane, etc) as directed, use it as long as suggested by your surgeon or therapist, typically at least 4-6 weeks.   EXERCISES  Results after joint replacement surgery are often greatly improved when you follow the exercise, range of motion and muscle strengthening exercises prescribed by your doctor. Safety measures are also important to protect the joint from further injury. Any time any of these exercises cause you to have increased pain or swelling, decrease what you are doing until you are comfortable again and then slowly increase them. If you have problems or questions, call your caregiver or physical therapist for advice.   Rehabilitation is important following a joint replacement. After just a few days of immobilization, the muscles of the leg can become weakened and shrink (atrophy).  These exercises are designed to build up the tone and strength of the thigh and leg muscles and to improve motion. Often times heat used for twenty to thirty minutes before working out will loosen up your tissues and help with improving the range of motion but do not use heat for the first two weeks following surgery (sometimes heat can increase post-operative swelling).   These exercises can be done on a training (exercise) mat, on the floor, on a table or on a bed. Use whatever works the best and is most comfortable for you.    Use music or television while you are exercising so that the exercises are a pleasant break in your day. This will make your life better with the exercises acting as a break in your routine that you can look forward to.   Perform all exercises about fifteen times, three times per day or as  directed.  You should exercise both the operative leg and the other leg as well.  Exercises include:    Quad Sets - Tighten up the muscle on the front of the thigh (Quad) and hold for 5-10 seconds.    Straight Leg Raises - With your knee straight (if you were given a brace, keep it on), lift the leg to 60 degrees, hold for 3 seconds, and slowly lower the leg.  Perform this exercise against resistance later as your leg gets stronger.   Leg Slides: Lying on your back, slowly slide your foot toward your buttocks, bending your knee up off the floor (only go as far as is comfortable). Then slowly slide your foot back down until your leg is flat on the floor again.   Angel Wings: Lying on your back spread your legs to the side as far apart as you can without causing discomfort.   Hamstring Strength:  Lying on your back, push your heel against the floor with your leg straight by tightening up the  muscles of your buttocks.  Repeat, but this time bend your knee to a comfortable angle, and push your heel against the floor.  You may put a pillow under the heel to make it more comfortable if necessary.   A rehabilitation program following joint replacement surgery can speed recovery and prevent re-injury in the future due to weakened muscles. Contact your doctor or a physical therapist for more information on knee rehabilitation.    CONSTIPATION  Constipation is defined medically as fewer than three stools per week and severe constipation as less than one stool per week.  Even if you have a regular bowel pattern at home, your normal regimen is likely to be disrupted due to multiple reasons following surgery.  Combination of anesthesia, postoperative narcotics, change in appetite and fluid intake all can affect your bowels.   YOU MUST use at least one of the following options; they are listed in order of increasing strength to get the job done.  They are all available over the counter, and you may need to  use some, POSSIBLY even all of these options:    Drink plenty of fluids (prune juice may be helpful) and high fiber foods Colace 100 mg by mouth twice a day  Senokot for constipation as directed and as needed Dulcolax (bisacodyl), take with full glass of water  Miralax (polyethylene glycol) once or twice a day as needed.  If you have tried all these things and are unable to have a bowel movement in the first 3-4 days after surgery call either your surgeon or your primary doctor.    If you experience loose stools or diarrhea, hold the medications until you stool forms back up.  If your symptoms do not get better within 1 week or if they get worse, check with your doctor.  If you experience "the worst abdominal pain ever" or develop nausea or vomiting, please contact the office immediately for further recommendations for treatment.   ITCHING:  If you experience itching with your medications, try taking only a single pain pill, or even half a pain pill at a time.  You can also use Benadryl over the counter for itching or also to help with sleep.   TED HOSE STOCKINGS:  Use stockings on both legs until for at least 2 weeks or as directed by physician office. They may be removed at night for sleeping.  MEDICATIONS:  See your medication summary on the After Visit Summary that nursing will review with you.  You may have some home medications which will be placed on hold until you complete the course of blood thinner medication.  It is important for you to complete the blood thinner medication as prescribed.  PRECAUTIONS:  If you experience chest pain or shortness of breath - call 911 immediately for transfer to the hospital emergency department.   If you develop a fever greater that 101 F, purulent drainage from wound, increased redness or drainage from wound, foul odor from the wound/dressing, or calf pain - CONTACT YOUR SURGEON.                                                   FOLLOW-UP  APPOINTMENTS:  If you do not already have a post-op appointment, please call the office for an appointment to be seen by your surgeon.  Guidelines for how  soon to be seen are listed in your After Visit Summary, but are typically between 1-4 weeks after surgery.  OTHER INSTRUCTIONS:   Knee Replacement:  Do not place pillow under knee, focus on keeping the knee straight while resting. CPM instructions: 0-90 degrees, 2 hours in the morning, 2 hours in the afternoon, and 2 hours in the evening. Place foam block, curve side up under heel at all times except when in CPM or when walking.  DO NOT modify, tear, cut, or change the foam block in any way.  MAKE SURE YOU:   Understand these instructions.   Get help right away if you are not doing well or get worse.    Thank you for letting us be a part of your medical care team.  It is a privilege we respect greatly.  We hope these instructions will help you stay on track for a fast and full recovery!   Keep right knee dressing clean and intact. May shower with dressing intact. Weight bearing as tolerated right leg. Encourage elevation and wiggling of toes right foot.

## 2015-03-05 NOTE — Clinical Social Work Note (Signed)
Clinical Social Work Assessment  Patient Details  Name: [name] [name] MRN: [id number] Date of Birth: [birth year]  Date of referral:  03/05/15               Reason for consult:  Facility Placement, Discharge Planning                Permission sought to share information with:  Facility Sport and exercise psychologist Permission granted to share information::  Yes, Verbal Permission Granted  Name::        Agency::  [facility]  Relationship::     Contact Information:  [phone]  Housing/Transportation Living arrangements for the past 2 months:  [place] of Information:  Patient Patient Interpreter Needed:  None Criminal Activity/Legal Involvement Pertinent to Current Situation/Hospitalization:  No - Comment as needed Significant Relationships:  Adult Children Lives with:  Self Do you feel safe going back to the place where you live?  No (High fall risk.) Need for family participation in patient care:  No (Coment) (Patient alert and oriented.)  Care giving concerns:  Patient expressed no concerns at this time.   Social Worker assessment / plan:  CSW received referral regarding possible SNF placement at time of discharge. CSW met with patient at bedside to discuss discharge disposition. Per patient, patient has completed paperwork for admission at [facility]. Patient reports patient from home alone, but has a son [name] [name]) who serves as patient's main support. CSW to continue to follow and assist with patient's discharge to [facility] once patient medically stable for discharge.  Employment status:  Retired Forensic scientist:  Medicare PT Recommendations:  [place] / Referral to community resources:  [place]  Patient/Family's Response to care:  Patient understanding and agreeable to CSW plan of care.  Patient/Family's Understanding of and Emotional Response to Diagnosis, Current Treatment, and Prognosis:  Patient  understanding and agreeable to CSW plan of care.  Emotional Assessment Appearance:  Appears stated age Attitude/Demeanor/Rapport:  Other (Pleasant.) Affect (typically observed):  Pleasant, Quiet, Appropriate, Happy, Accepting Orientation:  Oriented to Self, Oriented to Place, Oriented to  Time, Oriented to Situation Alcohol / Substance use:  Not Applicable Psych involvement (Current and /or in the community):  No (Comment) (Not appropriate on this admission.)  Discharge Needs  Concerns to be addressed:  No discharge needs identified Readmission within the last 30 days:  No Current discharge risk:  None Barriers to Discharge:  No Barriers Identified   Caroline Sauger, LCSW 03/05/2015, 11:38 AM [phone]

## 2015-03-05 NOTE — Progress Notes (Signed)
Pt a/o, c/o pain, PRN pain meds as ordered, pt oob with OT, Foley removed in am, tolerating diet, VSS, pt stable

## 2015-03-05 NOTE — Care Management Note (Signed)
Case Management Note  Patient Details  Name: [name] [name] MRN: [id number] Date of Birth: [birth year]  Subjective/Objective:     81 yr old female admitted with osteoarthritis of the right knee. Patient underwent a right total knee arthroplasty.               Action/Plan:       Patient will need shortterm rehab at [facility]. Will go to [facility]. Social worker is aware.    Expected Discharge Date: 03/07/15                 Expected Discharge Plan:  Skilled Nursing Facility  In-House Referral:  Clinical Social Work  Discharge planning Services  CM Consult  Post Acute Care Choice:  NA Choice offered to:  NA  DME Arranged:    DME Agency:     HH Arranged:  NA HH Agency:     Status of Service:  Completed, signed off  Medicare Important Message Given:    Date Medicare IM Given:    Medicare IM give by:    Date Additional Medicare IM Given:    Additional Medicare Important Message give by:     If discussed at [place] of Stay Meetings, dates discussed:    Additional Comments:  Ninfa Meeker, RN 03/05/2015, 4:00 PM

## 2015-03-05 NOTE — Evaluation (Signed)
Physical Therapy Evaluation Patient Details Name: BRANDAN GLAUBER MRN: [id number] DOB: 11/25/1932 Today's Date: 03/05/2015   History of Present Illness  Patient is a 81 y/o female s/p R TKA. PMH of bipolar disorder, depression, anxiety, hypothyroidism.  Clinical Impression  Patient presents with lethargy, balance deficits and post surgical deficits RLE s/p above surgery. Required Mod A of 2 for transfers and mobility due to safety. Education provided on HEP and knee precautions. Pt lives alone and would benefit from skilled PT and ST SNF to improve transfers, gait, balance and mobility so pt can maximize independence and return to PLOF prior to return home.     Follow Up Recommendations SNF;Supervision/Assistance - 24 hour    Equipment Recommendations  Other (comment) (defer to next venue)    Recommendations for Other Services       Precautions / Restrictions Precautions Precautions: Knee Precaution Booklet Issued: No Precaution Comments: Reviewed no pillow under knee. Restrictions Weight Bearing Restrictions: Yes RLE Weight Bearing: Weight bearing as tolerated      Mobility  Bed Mobility               General bed mobility comments: Sitting EOB with OT upon PT arrival.   Transfers Overall transfer level: Needs assistance Equipment used: Rolling walker (2 wheeled) Transfers: Sit to/from Stand Sit to Stand: Mod assist;+2 physical assistance         General transfer comment: Stood from EOB x1, from [facility] x1 with cues for hand placement and anterior translation. Difficulty transitioning UEs to RW- posterior lean. Manual cues for upright posture.  Ambulation/Gait Ambulation/Gait assistance: Min assist;+2 safety/equipment Ambulation Distance (Feet): 7 Feet Assistive device: Rolling walker (2 wheeled) Gait Pattern/deviations: Step-to pattern;Decreased stance time - right;Decreased step length - left;Trunk flexed;Narrow base of support   Gait velocity interpretation:  Below normal speed for age/gender General Gait Details: Pt with slow, unsteady gait. Cues to advance LEs and for RW management.  Stairs            Wheelchair Mobility    Modified Rankin (Stroke Patients Only)       Balance Overall balance assessment: Needs assistance Sitting-balance support: Feet supported;Single extremity supported Sitting balance-Leahy Scale: Fair     Standing balance support: During functional activity Standing balance-Leahy Scale: Poor Standing balance comment: Relient on RW for support.                             Pertinent Vitals/Pain Pain Assessment: No/denies pain    Home Living Family/patient expects to be discharged to:: Skilled nursing facility Living Arrangements: Alone                    Prior Function Level of Independence: Independent               Hand Dominance        Extremity/Trunk Assessment   Upper Extremity Assessment: Defer to OT evaluation           Lower Extremity Assessment: RLE deficits/detail;Generalized weakness RLE Deficits / Details: Grossly limited AROM hip,knee secondary to pain.       Communication   Communication: No difficulties  Cognition Arousal/Alertness: Lethargic;Suspect due to medications Behavior During Therapy: [facility] for tasks assessed/performed Overall Cognitive Status: Within Functional Limits for tasks assessed                      General Comments      Exercises Total  Joint Exercises Ankle Circles/Pumps: Both;10 reps;Supine Goniometric ROM: -15-70 degrees knee AAROM      Assessment/Plan    PT Assessment Patient needs continued PT services  PT Diagnosis Difficulty walking;Generalized weakness   PT Problem List Decreased strength;Decreased range of motion;Decreased cognition;Decreased activity tolerance;Decreased balance;Decreased mobility;Decreased knowledge of use of DME  PT Treatment Interventions Balance training;Gait training;Patient/family  education;Functional mobility training;Therapeutic activities;Therapeutic exercise;DME instruction   PT Goals (Current goals can be found in the Care Plan section) Acute Rehab PT Goals Patient Stated Goal: to go to rehab PT Goal Formulation: With patient Time For Goal Achievement: 03/19/15 Potential to Achieve Goals: Fair    Frequency 7X/week   Barriers to discharge Decreased caregiver support Pt lives alone.    Co-evaluation PT/OT/SLP Co-Evaluation/Treatment: Yes Reason for Co-Treatment: For patient/therapist safety PT goals addressed during session: Mobility/safety with mobility;Strengthening/ROM;Proper use of DME         End of Session Equipment Utilized During Treatment: Gait belt;Right knee immobilizer Activity Tolerance: Patient limited by lethargy Patient left: in chair;with call bell/phone within reach Nurse Communication: Mobility status         Time: 3235-5732 PT Time Calculation (min) (ACUTE ONLY): 30 min   Charges:   PT Evaluation $Initial PT Evaluation Tier I: 1 Procedure     PT G Codes:        Huldah Marin A Kambra Beachem 03/05/2015, 1:43 PM Wray Kearns, PT, DPT [phone]

## 2015-03-05 NOTE — Progress Notes (Signed)
Pt refusing to allow RN or NT to remove foley catheter this a.m. Pt states that she has "an overactive bladder" and would "prefer it came out at breakfast". RN educated pt on risks of leaving the foley catheter in and benefits of removing it. Pt states she understood. RN informed pt that her foley catheter would come out right after breakfast. RN will pass on during report, will continue to monitor.

## 2015-03-05 NOTE — Progress Notes (Signed)
Subjective: 1 Day Post-Op Procedure(s) (LRB): RIGHT TOTAL KNEE ARTHROPLASTY (Right) Patient reports pain as moderate.  Minimal acute blood loss anemia from surgery.  Slight tachycardia this am, but otherwise vitals stable.  Objective: Vital signs in last 24 hours: Temp:  [97.6 F (36.4 C)-99.4 F (37.4 C)] 98.8 F (37.1 C) (05/18 0505) Pulse Rate:  [70-128] 128 (05/18 0505) Resp:  [7-23] 19 (05/18 0505) BP: (94-175)/(55-91) 148/69 mmHg (05/18 0505) SpO2:  [95 %-100 %] 97 % (05/18 0505) Weight:  [72.122 kg (159 lb)-72.53 kg (159 lb 14.4 oz)] 72.122 kg (159 lb) (05/17 1052)  Intake/Output from previous day: 05/17 0701 - 05/18 0700 In: 1868.8 [I.V.:1768.8; IV Piggyback:100] Out: 1700 [Urine:1700] Intake/Output this shift:     Recent Labs  03/05/15 0440  HGB 11.7*    Recent Labs  03/05/15 0440  WBC 12.2*  RBC 3.85*  HCT 35.9*  PLT 231    Recent Labs  03/05/15 0440  NA 138  K 3.9  CL 99*  CO2 29  BUN 10  CREATININE 1.04*  GLUCOSE 137*  CALCIUM 8.8*   No results for input(s): LABPT, INR in the last 72 hours.  Sensation intact distally Intact pulses distally Dorsiflexion/Plantar flexion intact Incision: dressing C/D/I Compartment soft  Assessment/Plan: 1 Day Post-Op Procedure(s) (LRB): RIGHT TOTAL KNEE ARTHROPLASTY (Right) Up with therapy  Yacoub Diltz Y 03/05/2015, 7:44 AM

## 2015-03-05 NOTE — Op Note (Signed)
[name] [name], [name] [name]                ACCOUNT NO.:  [id number]  MEDICAL RECORD NO.:  [id number]  LOCATION:  [id number]                        FACILITY:  [facility]  PHYSICIAN:  Lind Guest. Ninfa Linden, M.D.DATE OF BIRTH:  17-May-1933  DATE OF PROCEDURE:  03/04/2015 DATE OF DISCHARGE:                              OPERATIVE REPORT   PREOPERATIVE DIAGNOSIS:  Primary osteoarthritis and degenerative joint disease, right knee.  POSTOPERATIVE DIAGNOSIS:  Primary osteoarthritis and degenerative joint disease, right knee.  PROCEDURE:  Right total knee arthroplasty.  IMPLANTS:  [employer] Knee with size 5 Oxinium femur, size 4 tibial tray, size 11 polyethylene constrained fix-bearing insert, size 29 patellar button.  SURGEON:  Lind Guest. Ninfa Linden, M.D.  ASSISTANT:  Erskine Emery, PA-C.  ANESTHESIA: 1. Spinal. 2. Local with intra-articular Exparel.  TOURNIQUET TIME:  1 hour and 5 minutes.  BLOOD LOSS:  Less than 100 mL.  ANTIBIOTICS:  2 g of IV Ancef.  COMPLICATIONS:  None.  INDICATIONS:  Ms. Carrero is a 81 year old patient well known to me.  She has had severe arthritis involving her right knee with x-rays showing complete loss of the joint space throughout the knee, periarticular osteophytes, sclerotic changes, and a varus deformity.  She has tried and failed conservative treatment measures including anti- inflammatories, rest, ice, heat, multiple injections in her knee, and is at the point where she has had detrimental effects on her quality of life, her activities of daily living, and her pain that has been significant and has affected her mobility as well.  At this point, she does wish to proceed with a total knee arthroplasty.  She understands the risk of acute blood loss anemia, nerve and vessel injury, fracture, infection, and DVT.  She understands the goals of decreased pain, improved mobility, and overall improved quality of life.  PROCEDURE DESCRIPTION:  After  informed consent was obtained, appropriate right knee was marked.  She was brought to the operating room and spinal anesthesia was obtained while she was on the OR table.  She was then laid in a supine position.  A Foley catheter was placed.  Had a nonsterile tourniquet placed on her upper right thigh.  Her right leg was prepped and draped with DuraPrep and sterile drapes from the thigh down to the toes.  Sterile stockinette was utilized as well.  With the bed raised, a time-out was called to identify the correct patient, correct right knee.  I then used an Esmarch to wrap out the leg and tourniquet was inflated to 300 mm of pressure.  We then made a midline incision over the patella and carried this proximally and distally.  I dissected around the knee joint and carried out a medial parapatellar arthrotomy and found a large joint effusion and multiple osteophytes throughout the knee.  There were loose bodies as well.  We removed remnants of ACL, PCL, medial and lateral meniscus, and then with the knee in a flexed position, I used an extramedullary cutting guide for the tibia setting for a 0-degree slope correcting her varus and valgus and taking 9 mm off the high side.  We made this cut without difficulty. We then went to the femur  and with the knee in a flexed position using an intramedullary guide drilled to the intercondylar notch.  We set our distal femoral cutting block for a 9.5 mm distal femoral cut based off 5 degrees right externally rotated.  We made this cut without difficulty and brought the knee back down.  We cleaned debris from the back of the knee, and then with a 9-mm extension block, we were able to get her to full extension when she started off with a flexion contracture.  We then went back to the femur and put our femoral sizing guide based off Whiteside's line and epicondylar axis.  We then chose a size 5 femur. We cut our size 5 4:1 cutting block and made our  anterior and posterior cuts followed by our chamfer cuts.  We then put a size 5 femur trial and took our box cut off this.  We then set our rotation on the tibia based off the femoral component and the tibial tubercle.  I then chose a size 4 tibial tray, we made our keel punch off this and our drill for a center peg.  With the trial components in place, we trialed 11-mm polyethylene insert so that she may hyperextend a little bit, and I was pleased with the stability of this.  We then made our patella cut taking several meters off the undersurface of the small patella and drilled 3 lugs for a size 29 patellar button.  We then removed all trial components after trialing and being satisfied with the trial.  We irrigated the knee with normal saline solution using pulsatile lavage. I cauterized around the knee and the posterior elements of the knee and then placed Exparel, mixture of 20 mL of Exparel and 40 mL of normal saline throughout the joint capsule.  We then mixed our cement and cemented the real [employer] tibial tray size 4, followed by the real size 5 Oxinium femur.  We placed the real constrained 11 mm polyethylene insert and cemented the patellar button.  Once the cement had hardened, we  let the tourniquet down, removed the cement debris from the knee and hemostasis was obtained again with electrocautery.  We then irrigated the knee with normal saline solution using pulsatile lavage.  We closed the joint capsule with interrupted #1 Ethibond suture, followed by 0 Vicryl in the deep tissue, 0 Vicryl in subcutaneous tissue, 4-0 Monocryl subcuticular stitch, and Steri-Strips on the skin.  A well-padded dressing was applied, and she was taken off the operating table and taken to the recovery room in stable condition. All final counts were correct.  No complications noted.  Of note, Benita Stabile, PA-C assisted in the  entire case, and his assistance was crucial in patient  positioning, helping expose the knee and retracting, as well as layered closure of the wound.     Lind Guest. Ninfa Linden, M.D.     CYB/MEDQ  D:  03/04/2015  T:  03/05/2015  Job:  [id number]

## 2015-03-06 LAB — CBC
HCT: 28.4 % — ABNORMAL LOW (ref 36.0–46.0)
Hemoglobin: 9.7 g/dL — ABNORMAL LOW (ref 12.0–15.0)
MCH: 31.4 pg (ref 26.0–34.0)
MCHC: 34.2 g/dL (ref 30.0–36.0)
MCV: 91.9 fL (ref 78.0–100.0)
Platelets: 178 10*3/uL (ref 150–400)
RBC: 3.09 MIL/uL — ABNORMAL LOW (ref 3.87–5.11)
RDW: 13.9 % (ref 11.5–15.5)
WBC: 13 10*3/uL — ABNORMAL HIGH (ref 4.0–10.5)

## 2015-03-06 MED ORDER — METHOCARBAMOL 500 MG PO TABS: 500.0000 mg | ORAL_TABLET | Freq: Four times a day (QID) | ORAL | Status: DC | PRN

## 2015-03-06 MED ORDER — OXYCODONE-ACETAMINOPHEN 5-325 MG PO TABS: 1.0000 | ORAL_TABLET | ORAL | Status: DC | PRN

## 2015-03-06 MED ORDER — DOCUSATE SODIUM 100 MG PO CAPS: 100.0000 mg | ORAL_CAPSULE | Freq: Two times a day (BID) | ORAL | Status: DC

## 2015-03-06 MED ORDER — RIVAROXABAN 10 MG PO TABS: 10.0000 mg | ORAL_TABLET | Freq: Every day | ORAL | Status: DC

## 2015-03-06 NOTE — Progress Notes (Signed)
Physical Therapy Treatment Patient Details Name: [name] [name] MRN: [id number] DOB: November 02, 1932 Today's Date: 03/06/2015    History of Present Illness Patient is a 81 y/o female s/p R TKA. PMH of bipolar disorder, depression, anxiety, hypothyroidism.    PT Comments    Patient progressing slowly with mobility. Improved ambulation distance today with Min A for balance and to advance BLEs. Pt lethargic due to pain medication and having difficulty staying awake/focused towards end of session.  Reviewed HEP. Very slow gait putting pt at increased risk for falls. Will continue to follow and progress as tolerated to maximize mobility.   Follow Up Recommendations  SNF;Supervision/Assistance - 24 hour     Equipment Recommendations  Other (comment)    Recommendations for Other Services       Precautions / Restrictions Precautions Precautions: Knee;Fall Precaution Booklet Issued: No Precaution Comments: Reviewed no pillow under knee. Restrictions Weight Bearing Restrictions: Yes RLE Weight Bearing: Weight bearing as tolerated    Mobility  Bed Mobility Overal bed mobility: Needs Assistance Bed Mobility: Supine to Sit     Supine to sit: Mod assist     General bed mobility comments: Mod A to progress LEs to EOB and rotate hips. Pt able to use UEs to pull to sitting.   Transfers Overall transfer level: Needs assistance Equipment used: Rolling walker (2 wheeled) Transfers: Sit to/from Stand Sit to Stand: Mod assist         General transfer comment: MOd A to rise from EOB with cues for hand placement and technique. Increased time to perform all transfers. manual cues for upright posture.  Ambulation/Gait Ambulation/Gait assistance: Min assist Ambulation Distance (Feet): 40 Feet Assistive device: Rolling walker (2 wheeled) Gait Pattern/deviations: Step-to pattern;Decreased stance time - right;Decreased step length - left;Trunk flexed;Shuffle   Gait velocity interpretation:  Below normal speed for age/gender General Gait Details: Pt shuffling bilaterally feet- cues to pick up feet during swing and Min A for weight shifting. Very slow gait speed. Cues for RW management.   Stairs            Wheelchair Mobility    Modified Rankin (Stroke Patients Only)       Balance Overall balance assessment: Needs assistance Sitting-balance support: Feet supported;No upper extremity supported Sitting balance-Leahy Scale: Fair     Standing balance support: During functional activity Standing balance-Leahy Scale: Poor                      Cognition Arousal/Alertness: Lethargic;Suspect due to medications Behavior During Therapy: [facility] for tasks assessed/performed Overall Cognitive Status: Within Functional Limits for tasks assessed                      Exercises Total Joint Exercises Ankle Circles/Pumps: Both;10 reps;Supine Quad Sets: Right;5 reps;AAROM;Supine Heel Slides: AAROM;Right;10 reps;Seated Hip ABduction/ADduction: AAROM;Right;5 reps;Seated    General Comments        Pertinent Vitals/Pain Pain Assessment: Faces Faces Pain Scale: Hurts even more Pain Location: right knee Pain Descriptors / Indicators: Sore;Aching Pain Intervention(s): Limited activity within patient's tolerance;Monitored during session;Premedicated before session;Ice applied;Repositioned    Home Living                      Prior Function            PT Goals (current goals can now be found in the care plan section) Progress towards PT goals: Progressing toward goals    Frequency  7X/week  PT Plan Current plan remains appropriate    Co-evaluation             End of Session Equipment Utilized During Treatment: Gait belt;Right knee immobilizer Activity Tolerance: Patient limited by lethargy Patient left: in chair;with call bell/phone within reach     Time: 1353-1420 PT Time Calculation (min) (ACUTE ONLY): 27 min  Charges:  $Gait  Training: 8-22 mins $Therapeutic Exercise: 8-22 mins                    G Codes:      Elvena Oyer A Daelan Gatt 03/06/2015, 3:15 PM Wray Kearns, [place], DPT [phone]

## 2015-03-06 NOTE — Progress Notes (Signed)
Subjective: 2 Days Post-Op Procedure(s) (LRB): RIGHT TOTAL KNEE ARTHROPLASTY (Right) Patient reports pain as moderate.  No complaints. Slow progress with PT.  Objective: Vital signs in last 24 hours: Temp:  [98.8 F (37.1 C)-99.3 F (37.4 C)] 99 F (37.2 C) (05/19 0600) Pulse Rate:  [104-106] 106 (05/19 0600) Resp:  [16] 16 (05/19 0600) BP: (130-158)/(66-72) 139/72 mmHg (05/19 0600) SpO2:  [92 %-97 %] 93 % (05/19 0600)  Intake/Output from previous day: 05/18 0701 - 05/19 0700 In: 840 [P.O.:840] Out: 150 [Urine:150] Intake/Output this shift: Total I/O In: 240 [P.O.:240] Out: 500 [Urine:500]   Recent Labs  03/05/15 0440 03/06/15 0552  HGB 11.7* 9.7*    Recent Labs  03/05/15 0440 03/06/15 0552  WBC 12.2* 13.0*  RBC 3.85* 3.09*  HCT 35.9* 28.4*  PLT 231 178    Recent Labs  03/05/15 0440  NA 138  K 3.9  CL 99*  CO2 29  BUN 10  CREATININE 1.04*  GLUCOSE 137*  CALCIUM 8.8*   No results for input(s): LABPT, INR in the last 72 hours. Right Knee:  Incision: no drainage Compartment soft  Assessment/Plan: 2 Days Post-Op Procedure(s) (LRB): RIGHT TOTAL KNEE ARTHROPLASTY (Right) Up with therapy Discharge to SNF tomorrow. ABLA secondary to surgery will monitor HgB and for symptoms of anemia.   Erskine Emery 03/06/2015, 10:17 AM

## 2015-03-07 DIAGNOSIS — L409 Psoriasis, unspecified: Secondary | ICD-10-CM | POA: Diagnosis not present

## 2015-03-07 DIAGNOSIS — K5900 Constipation, unspecified: Secondary | ICD-10-CM | POA: Diagnosis not present

## 2015-03-07 DIAGNOSIS — F319 Bipolar disorder, unspecified: Secondary | ICD-10-CM | POA: Diagnosis not present

## 2015-03-07 DIAGNOSIS — N179 Acute kidney failure, unspecified: Secondary | ICD-10-CM | POA: Diagnosis not present

## 2015-03-07 DIAGNOSIS — M1991 Primary osteoarthritis, unspecified site: Secondary | ICD-10-CM | POA: Diagnosis not present

## 2015-03-07 DIAGNOSIS — G4700 Insomnia, unspecified: Secondary | ICD-10-CM | POA: Diagnosis not present

## 2015-03-07 DIAGNOSIS — R2681 Unsteadiness on feet: Secondary | ICD-10-CM | POA: Diagnosis not present

## 2015-03-07 DIAGNOSIS — Z96651 Presence of right artificial knee joint: Secondary | ICD-10-CM | POA: Diagnosis not present

## 2015-03-07 DIAGNOSIS — Z471 Aftercare following joint replacement surgery: Secondary | ICD-10-CM | POA: Diagnosis not present

## 2015-03-07 DIAGNOSIS — M1711 Unilateral primary osteoarthritis, right knee: Secondary | ICD-10-CM | POA: Diagnosis not present

## 2015-03-07 DIAGNOSIS — E039 Hypothyroidism, unspecified: Secondary | ICD-10-CM | POA: Diagnosis not present

## 2015-03-07 DIAGNOSIS — M179 Osteoarthritis of knee, unspecified: Secondary | ICD-10-CM | POA: Diagnosis not present

## 2015-03-07 DIAGNOSIS — M6281 Muscle weakness (generalized): Secondary | ICD-10-CM | POA: Diagnosis not present

## 2015-03-07 DIAGNOSIS — E43 Unspecified severe protein-calorie malnutrition: Secondary | ICD-10-CM | POA: Diagnosis not present

## 2015-03-07 DIAGNOSIS — D62 Acute posthemorrhagic anemia: Secondary | ICD-10-CM | POA: Diagnosis not present

## 2015-03-07 DIAGNOSIS — R278 Other lack of coordination: Secondary | ICD-10-CM | POA: Diagnosis not present

## 2015-03-07 DIAGNOSIS — K589 Irritable bowel syndrome without diarrhea: Secondary | ICD-10-CM | POA: Diagnosis not present

## 2015-03-07 DIAGNOSIS — F329 Major depressive disorder, single episode, unspecified: Secondary | ICD-10-CM | POA: Diagnosis not present

## 2015-03-07 DIAGNOSIS — M25561 Pain in right knee: Secondary | ICD-10-CM | POA: Diagnosis not present

## 2015-03-07 LAB — CBC
HCT: 28.6 % — ABNORMAL LOW (ref 36.0–46.0)
Hemoglobin: 9.3 g/dL — ABNORMAL LOW (ref 12.0–15.0)
MCH: 30.6 pg (ref 26.0–34.0)
MCHC: 32.5 g/dL (ref 30.0–36.0)
MCV: 94.1 fL (ref 78.0–100.0)
Platelets: 198 10*3/uL (ref 150–400)
RBC: 3.04 MIL/uL — ABNORMAL LOW (ref 3.87–5.11)
RDW: 14.2 % (ref 11.5–15.5)
WBC: 12.2 10*3/uL — ABNORMAL HIGH (ref 4.0–10.5)

## 2015-03-07 MED ORDER — METHOCARBAMOL 500 MG PO TABS: 500.0000 mg | ORAL_TABLET | Freq: Four times a day (QID) | ORAL | Status: DC | PRN

## 2015-03-07 MED ORDER — RIVAROXABAN 10 MG PO TABS: 10.0000 mg | ORAL_TABLET | Freq: Every day | ORAL | Status: DC

## 2015-03-07 MED ORDER — OXYCODONE-ACETAMINOPHEN 5-325 MG PO TABS: 1.0000 | ORAL_TABLET | ORAL | Status: DC | PRN

## 2015-03-07 NOTE — Discharge Planning (Signed)
Patient to be discharged to [facility]. Patient updated at bedside.  Facility: [facility] RN report number: [phone] Transportation: EMS ([facility])  Caisen Mangas, LCSWA - [phone] Clinical Social Work Department Orthopedics (5N9-32) and Surgical (6N24-32)   

## 2015-03-07 NOTE — Discharge Summary (Signed)
Patient ID: [name] [name] MRN: [id number] DOB/AGE: Aug 23, 1933 81 y.o.  Admit date: 03/04/2015 Discharge date: 03/07/2015  Admission Diagnoses:  Principal Problem:   Osteoarthritis of right knee Active Problems:   Status post total right knee replacement   Discharge Diagnoses:  Same  Past Medical History  Diagnosis Date  . Hypothyroidism   . Insomnia   . Psoriasis   . Eczema   . Osteoporosis   . Bipolar disorder   . Osteoarthritis   . Depression     bipolar  . Anemia     h/o fibroids    Surgeries: Procedure(s): RIGHT TOTAL KNEE ARTHROPLASTY on 03/04/2015   Consultants:    Discharged Condition: Improved  Hospital Course: [name] [name] is an 81 y.o. female who was admitted 03/04/2015 for operative treatment ofOsteoarthritis of right knee. Patient has severe unremitting pain that affects sleep, daily activities, and work/hobbies. After pre-op clearance the patient was taken to the operating room on 03/04/2015 and underwent  Procedure(s): RIGHT TOTAL KNEE ARTHROPLASTY.    Patient was given perioperative antibiotics: Anti-infectives    Start     Dose/Rate Route Frequency Ordered Stop   03/04/15 2015  ceFAZolin (ANCEF) IVPB 1 g/50 mL premix     1 g 100 mL/hr over 30 Minutes Intravenous Every 6 hours 03/04/15 2004 03/05/15 0629   03/04/15 1200  ceFAZolin (ANCEF) IVPB 2 g/50 mL premix     2 g 100 mL/hr over 30 Minutes Intravenous To Surgery 03/03/15 1337 03/04/15 1236       Patient was given sequential compression devices, early ambulation, and chemoprophylaxis to prevent DVT.  Patient benefited maximally from hospital stay and there were no complications.    Recent vital signs: Patient Vitals for the past 24 hrs:  BP Temp Temp src Pulse Resp SpO2  03/07/15 0623 135/64 mmHg 98.5 F (36.9 C) Oral 99 16 97 %  03/06/15 2117 127/62 mmHg 99.1 F (37.3 C) Oral (!) 106 16 94 %  03/06/15 1500 136/61 mmHg 98.6 F (37 C) Oral 97 17 96 %     Recent laboratory studies:   Recent Labs  03/05/15 0440 03/06/15 0552 03/07/15 0430  WBC 12.2* 13.0* 12.2*  HGB 11.7* 9.7* 9.3*  HCT 35.9* 28.4* 28.6*  PLT 231 178 198  NA 138  --   --   K 3.9  --   --   CL 99*  --   --   CO2 29  --   --   BUN 10  --   --   CREATININE 1.04*  --   --   GLUCOSE 137*  --   --   CALCIUM 8.8*  --   --      Discharge Medications:     Medication List    STOP taking these medications        naproxen sodium 220 MG tablet  Commonly known as:  ANAPROX      TAKE these medications        AMBIEN 10 MG tablet  Generic drug:  zolpidem  Take 10 mg by mouth at bedtime.     ammonium lactate 12 % cream  Commonly known as:  LAC-HYDRIN  Apply topically as needed for dry skin.     ARIPiprazole 5 MG tablet  Commonly known as:  ABILIFY  Take 1 tablet by mouth daily.     b complex vitamins tablet  Take 2 tablets by mouth daily.     B-12 2500 MCG Tabs  Take  1 tablet by mouth daily.     calcium carbonate 600 MG Tabs tablet  Commonly known as:  OS-CAL  Take 600 mg by mouth 2 (two) times daily with a meal.     cholecalciferol 1000 UNITS tablet  Commonly known as:  VITAMIN D  Take 2,000 Units by mouth daily.     docusate sodium 100 MG capsule  Commonly known as:  COLACE  Take 1 capsule (100 mg total) by mouth 2 (two) times daily.     fish oil-omega-3 fatty acids 1000 MG capsule  Take 1 g by mouth daily.     Glucosamine 500 MG Tabs  Take 1,000 mg by mouth daily.     hypromellose 0.3 % Gel ophthalmic ointment  Commonly known as:  GENTEAL  Place 1 application into both eyes at bedtime.     levothyroxine 75 MCG tablet  Commonly known as:  SYNTHROID, LEVOTHROID  TAKE 1 TABLET (75 MCG TOTAL) BY MOUTH DAILY BEFORE BREAKFAST.     Lutein 40 MG Caps  Take 40 mg by mouth daily.     methocarbamol 500 MG tablet  Commonly known as:  ROBAXIN  Take 1 tablet (500 mg total) by mouth every 6 (six) hours as needed for muscle spasms.     multivitamin per tablet  Take 1 tablet  by mouth daily.     oxyCODONE-acetaminophen 5-325 MG per tablet  Commonly known as:  ROXICET  Take 1-2 tablets by mouth every 4 (four) hours as needed for severe pain.     rivaroxaban 10 MG Tabs tablet  Commonly known as:  XARELTO  Take 1 tablet (10 mg total) by mouth daily with breakfast.        Diagnostic Studies: Dg Knee Right Port  03/04/2015   CLINICAL DATA:  Status post right knee arthroplasty.  EXAM: PORTABLE RIGHT KNEE - 1-2 VIEW  COMPARISON:  10/15/2014  FINDINGS: Radiographs show normal alignment of a total right knee arthroplasty. No abnormal lucency or fracture is identified.  IMPRESSION: Normal alignment of right knee arthroplasty.   Electronically Signed   By: Aletta Edouard M.D.   On: 03/04/2015 15:09    Disposition: to skilled nursing facility      Discharge Instructions    Discharge patient    Complete by:  As directed      Discharge wound care:    Complete by:  As directed   Keep dressing clean and intact. May shower with dressing intact.     Elevate operative extremity    Complete by:  As directed   Encourage patient to wiggle toes often     Weight bearing as tolerated    Complete by:  As directed   Laterality:  right  Extremity:  Lower           Follow-up Information    Schedule an appointment as soon as possible for a visit with Mcarthur Rossetti, MD.   Specialty:  Orthopedic Surgery   Contact information:   [place] [address] [phone]        Signed: Mcarthur Rossetti 03/07/2015, 6:53 AM

## 2015-03-07 NOTE — Progress Notes (Signed)
Physical Therapy Treatment Patient Details Name: [name] [name] MRN: [id number] DOB: Apr 24, 1933 Today's Date: 03/07/2015    History of Present Illness Patient is a 81 y/o female s/p R TKA. PMH of bipolar disorder, depression, anxiety, hypothyroidism.    PT Comments    Patient progressing well with mobility. Less lethargic today. Improved ambulation distance and requiring less assist with transfers. Continues to exhibit slow gait speed putting pt at increased risk for falls. Will continue to follow to ease burden of care and maximize independence.   Follow Up Recommendations  SNF;Supervision/Assistance - 24 hour     Equipment Recommendations  None recommended by PT    Recommendations for Other Services       Precautions / Restrictions Precautions Precautions: Knee;Fall Precaution Booklet Issued: Yes (comment) Precaution Comments: Reviewed no pillow under knee. Restrictions Weight Bearing Restrictions: Yes RLE Weight Bearing: Weight bearing as tolerated    Mobility  Bed Mobility Overal bed mobility: Needs Assistance Bed Mobility: Supine to Sit           General bed mobility comments: Sitting in recliner upon PT arrival.   Transfers Overall transfer level: Needs assistance Equipment used: Rolling walker (2 wheeled) Transfers: Sit to/from Stand Sit to Stand: Min guard         General transfer comment: Min guard to rise from chair x1, from toilet x1.   Ambulation/Gait Ambulation/Gait assistance: Min guard Ambulation Distance (Feet): 100 Feet Assistive device: Rolling walker (2 wheeled) Gait Pattern/deviations: Step-to pattern;Decreased stance time - right;Decreased step length - left;Trunk flexed   Gait velocity interpretation: Below normal speed for age/gender General Gait Details: Cues for upright posture. Able to clear Bil feet today. Cues to increase gait speed and for knee extension during stance phase.    Stairs            Wheelchair Mobility     Modified Rankin (Stroke Patients Only)       Balance Overall balance assessment: Needs assistance Sitting-balance support: Feet supported;No upper extremity supported Sitting balance-Leahy Scale: Fair     Standing balance support: During functional activity Standing balance-Leahy Scale: Fair Standing balance comment: Tolerated dynamic standing - washing hands without UE support for short periods.                    Cognition Arousal/Alertness: Awake/alert Behavior During Therapy: WFL for tasks assessed/performed Overall Cognitive Status: Within Functional Limits for tasks assessed                      Exercises Total Joint Exercises Ankle Circles/Pumps: Both;15 reps;Seated Quad Sets: Right;10 reps;AAROM;Seated Goniometric ROM: -16-70 degrees knee AROM.    General Comments General comments (skin integrity, edema, etc.): pt's son present in room during session.      Pertinent Vitals/Pain Pain Assessment: Faces Faces Pain Scale: Hurts even more Pain Location: right knee Pain Descriptors / Indicators: Sore;Aching Pain Intervention(s): Limited activity within patient's tolerance;Monitored during session;Repositioned;Premedicated before session    Home Living                      Prior Function            PT Goals (current goals can now be found in the care plan section) Progress towards PT goals: Progressing toward goals    Frequency  7X/week    PT Plan Current plan remains appropriate    Co-evaluation             End  of Session Equipment Utilized During Treatment: Gait belt Activity Tolerance: Patient tolerated treatment well Patient left: in chair;with call bell/phone within reach;with family/visitor present     Time: 0630-1601 PT Time Calculation (min) (ACUTE ONLY): 37 min  Charges:  $Gait Training: 23-37 mins                    G Codes:      Bertram 03/07/2015, 1:45 PM Wray Kearns, [place],  DPT [phone]

## 2015-03-07 NOTE — Progress Notes (Signed)
Patient ID: [name] [name], female   DOB: 08-06-1933, 81 y.o.   MRN: [id number] Doing well.  No acute changes.  Vitals and labs stable.  Right knee stable.  Can discharge to SNF today.

## 2015-03-07 NOTE — Clinical Social Work Placement (Addendum)
   CLINICAL SOCIAL WORK PLACEMENT  NOTE  Date:  03/07/2015  Patient Details  Name: [name] [name] MRN: [id number] Date of Birth: 1933-03-20  Clinical Social Work is seeking post-discharge placement for this patient at the [place] level of care (*CSW will initial, date and re-position this form in  chart as items are completed):  Yes   Patient/family provided with [place] Work Department's list of facilities offering this level of care within the geographic area requested by the patient (or if unable, by the patient's family).  Yes   Patient/family informed of their freedom to choose among providers that offer the needed level of care, that participate in Medicare, Medicaid or managed care program needed by the patient, have an available bed and are willing to accept the patient.  Yes   Patient/family informed of [facility]'s ownership interest in [facility] and [facility], as well as of the fact that they are under no obligation to receive care at these facilities.  PASRR submitted to EDS on 03/05/15     PASRR number received on 03/05/15     Existing PASRR number confirmed on       FL2 transmitted to all facilities in geographic area requested by pt/family on 03/05/15     FL2 transmitted to all facilities within larger geographic area on       Patient informed that his/her managed care company has contracts with or will negotiate with certain facilities, including the following:        Yes   Patient/family informed of bed offers received.  Patient chooses bed at [facility]     Physician recommends and patient chooses bed at      Patient to be transferred to [facility] on 03/07/15.  Patient to be transferred to facility by [facility]     Patient family notified on 03/07/15 of transfer.  Name of family member notified:  Patient updated at bedside.     PHYSICIAN       Additional Comment:     _______________________________________________ Caroline Sauger, LCSW 03/07/2015, 11:55 AM [phone]

## 2015-03-10 ENCOUNTER — Encounter (HOSPITAL_COMMUNITY): Payer: Self-pay | Admitting: Orthopaedic Surgery

## 2015-03-10 ENCOUNTER — Non-Acute Institutional Stay (SKILLED_NURSING_FACILITY): Payer: Medicare Other | Admitting: Adult Health

## 2015-03-10 DIAGNOSIS — E039 Hypothyroidism, unspecified: Secondary | ICD-10-CM | POA: Diagnosis not present

## 2015-03-10 DIAGNOSIS — L409 Psoriasis, unspecified: Secondary | ICD-10-CM

## 2015-03-10 DIAGNOSIS — G4700 Insomnia, unspecified: Secondary | ICD-10-CM

## 2015-03-10 DIAGNOSIS — D62 Acute posthemorrhagic anemia: Secondary | ICD-10-CM

## 2015-03-10 DIAGNOSIS — M1711 Unilateral primary osteoarthritis, right knee: Secondary | ICD-10-CM

## 2015-03-10 DIAGNOSIS — F319 Bipolar disorder, unspecified: Secondary | ICD-10-CM | POA: Diagnosis not present

## 2015-03-11 ENCOUNTER — Non-Acute Institutional Stay (SKILLED_NURSING_FACILITY): Payer: Medicare Other | Admitting: Internal Medicine

## 2015-03-11 DIAGNOSIS — G4700 Insomnia, unspecified: Secondary | ICD-10-CM

## 2015-03-11 DIAGNOSIS — K5900 Constipation, unspecified: Secondary | ICD-10-CM

## 2015-03-11 DIAGNOSIS — D62 Acute posthemorrhagic anemia: Secondary | ICD-10-CM | POA: Diagnosis not present

## 2015-03-11 DIAGNOSIS — N179 Acute kidney failure, unspecified: Secondary | ICD-10-CM | POA: Diagnosis not present

## 2015-03-11 DIAGNOSIS — M1710 Unilateral primary osteoarthritis, unspecified knee: Secondary | ICD-10-CM

## 2015-03-11 DIAGNOSIS — E039 Hypothyroidism, unspecified: Secondary | ICD-10-CM | POA: Diagnosis not present

## 2015-03-11 DIAGNOSIS — M1711 Unilateral primary osteoarthritis, right knee: Secondary | ICD-10-CM

## 2015-03-11 DIAGNOSIS — F32A Depression, unspecified: Secondary | ICD-10-CM

## 2015-03-11 DIAGNOSIS — M179 Osteoarthritis of knee, unspecified: Secondary | ICD-10-CM

## 2015-03-11 DIAGNOSIS — IMO0002 Reserved for concepts with insufficient information to code with codable children: Secondary | ICD-10-CM

## 2015-03-11 DIAGNOSIS — F329 Major depressive disorder, single episode, unspecified: Secondary | ICD-10-CM

## 2015-03-11 NOTE — Progress Notes (Signed)
Patient ID: [name] [name], female   DOB: 09-08-1933, 81 y.o.   MRN: [id number]      [facility]   PCP: Garnet Koyanagi, DO  Code Status: full code  Allergies  Allergen Reactions  . Bupropion Hcl Rash    Chief Complaint  Patient presents with  . New Admit To SNF     HPI:  81 year old patient is here for short term rehabilitation post hospital admission from 03/04/15-03/07/15 with right knee OA. She underwent total right knee replacement. She is seen in her room today. She complaints of pain bothering her but mentions current regimen is helpful. She has worked well with therapy today and is tired. She denies any other concerns. No new concern from staff.  Review of Systems:  Constitutional: Negative for fever, chills, diaphoresis.  HENT: Negative for headache, congestion, nasal discharge Eyes: Negative for eye pain, blurred vision, double vision and discharge.  Respiratory: Negative for cough, shortness of breath and wheezing.   Cardiovascular: Negative for chest pain, palpitations, leg swelling.  Gastrointestinal: Negative for heartburn, nausea, vomiting, abdominal pain. Had bowel movement yesterday.  Genitourinary: Negative for dysuria Musculoskeletal: Negative for back pain, falls Skin: Negative for itching, rash.  Neurological: Negative for dizziness, tingling, focal weakness Psychiatric/Behavioral: Negative for depression   Past Medical History  Diagnosis Date  . Hypothyroidism   . Insomnia   . Psoriasis   . Eczema   . Osteoporosis   . Bipolar disorder   . Osteoarthritis   . Depression     bipolar  . Anemia     h/o fibroids   Past Surgical History  Procedure Laterality Date  . Appendectomy  2003  . Abdominal hysterectomy  1982  . Tonsillectomy  1970's  . Ventral hernia repair  2005  . Breast lumpectomy  1947    left  . Cataract extraction  2010  . Knee arthroscopy  09/07/11    right knee  . Nasal septum surgery  1970's    . Tonsillectomy    . Eye surgery    . Hernia repair    . Total knee arthroplasty Right 03/04/2015    Procedure: RIGHT TOTAL KNEE ARTHROPLASTY;  Surgeon: Mcarthur Rossetti, MD;  Location: [facility];  Service: Orthopedics;  Laterality: Right;   Social History:   reports that she quit smoking about 29 years ago. She has never used smokeless tobacco. She reports that she does not drink alcohol or use illicit drugs.  Family History  Problem Relation Age of Onset  . Colon cancer Brother   . Lung cancer Brother   . Lung cancer Father   . Prostate cancer Father   . Arthritis Father   . Cancer Father     lung, prostate  . Heart disease Brother   . Aortic aneurysm Brother   . Cancer Brother     lung  . Cancer Other     colon    Medications: Patient's Medications  New Prescriptions   No medications on file  Previous Medications   AMMONIUM LACTATE (LAC-HYDRIN) 12 % CREAM    Apply topically as needed for dry skin.   ARIPIPRAZOLE (ABILIFY) 5 MG TABLET    Take 1 tablet by mouth daily.   B COMPLEX VITAMINS TABLET    Take 2 tablets by mouth daily.   CALCIUM CARBONATE (OS-CAL) 600 MG TABS    Take 600 mg by mouth 2 (two) times daily with a meal.     CHOLECALCIFEROL (  VITAMIN D) 1000 UNITS TABLET    Take 2,000 Units by mouth daily.    CYANOCOBALAMIN (B-12) 2500 MCG TABS    Take 1 tablet by mouth daily.   DOCUSATE SODIUM (COLACE) 100 MG CAPSULE    Take 1 capsule (100 mg total) by mouth 2 (two) times daily.   FISH OIL-OMEGA-3 FATTY ACIDS 1000 MG CAPSULE    Take 1 g by mouth daily.     GLUCOSAMINE 500 MG TABS    Take 1,000 mg by mouth daily.    HYPROMELLOSE (GENTEAL) 0.3 % GEL OPHTHALMIC OINTMENT    Place 1 application into both eyes at bedtime.   LEVOTHYROXINE (SYNTHROID, LEVOTHROID) 75 MCG TABLET    TAKE 1 TABLET (75 MCG TOTAL) BY MOUTH DAILY BEFORE BREAKFAST.   LUTEIN 40 MG CAPS    Take 40 mg by mouth daily.    METHOCARBAMOL (ROBAXIN) 500 MG TABLET    Take 1 tablet (500 mg total) by mouth  every 6 (six) hours as needed for muscle spasms.   MULTIVITAMIN (THERAGRAN) PER TABLET    Take 1 tablet by mouth daily.     OXYCODONE-ACETAMINOPHEN (ROXICET) 5-325 MG PER TABLET    Take 1-2 tablets by mouth every 4 (four) hours as needed for severe pain.   RIVAROXABAN (XARELTO) 10 MG TABS TABLET    Take 1 tablet (10 mg total) by mouth daily with breakfast.   ZOLPIDEM (AMBIEN) 10 MG TABLET    Take 10 mg by mouth at bedtime.   Modified Medications   No medications on file  Discontinued Medications   No medications on file     Physical Exam: Filed Vitals:   03/11/15 1326  Temp: 98.6 F (37 C)  Resp: 18  SpO2: 99%    General- elderly female, in no acute distress Head- normocephalic, atraumatic Throat- moist mucus membrane Eyes- PERRLA, EOMI, no pallor, no icterus, no discharge, normal conjunctiva, normal sclera Neck- no cervical lymphadenopathy Cardiovascular- normal s1,s2, no murmurs, plapable dorsalis pedis , trace bilateral leg edema Respiratory- bilateral clear to auscultation, no wheeze, no rhonchi, no crackles, no use of accessory muscles Abdomen- bowel sounds present, soft, non tender Musculoskeletal- able to move all 4 extremities, limited right knee range of motion  Neurological- no focal deficit Skin- warm and dry, right knee aquacel dressing  Psychiatry- alert and oriented to person, place and time, normal mood and affect    Labs reviewed: Basic Metabolic Panel:  Recent Labs  02/10/15 1638 02/28/15 1426 03/05/15 0440  NA 143 140 138  K 3.9 3.9 3.9  CL 104 103 99*  CO2 29 25 29   GLUCOSE 95 115* 137*  BUN 10 10 10   CREATININE 0.83 0.85 1.04*  CALCIUM 10.2 9.7 8.8*   Liver Function Tests:  Recent Labs  02/10/15 1638  AST 23  ALT 19  ALKPHOS 33*  BILITOT 0.4  PROT 7.8  ALBUMIN 4.5   No results for input(s): LIPASE, AMYLASE in the last 8760 hours. No results for input(s): AMMONIA in the last 8760 hours. CBC:  Recent Labs  02/10/15 1638   03/05/15 0440 03/06/15 0552 03/07/15 0430  WBC 7.4  < > 12.2* 13.0* 12.2*  NEUTROABS 4.1  --   --   --   --   HGB 14.3  < > 11.7* 9.7* 9.3*  HCT 42.5  < > 35.9* 28.4* 28.6*  MCV 91.7  < > 93.2 91.9 94.1  PLT 269.0  < > 231 178 198  < > = values  in this interval not displayed.   Assessment/Plan   Right knee OA S/p right TKA. Will have patient work with PT/OT as tolerated to regain strength and restore function.  Fall precautions are in place.has f/u with orthopedics. WBAT. Continue xarelto for dvt prophylaxis. Continue robaxin 500 mg q6h prn muscle spasm and roxicet 5-325 1-2 tab q4h prn pain.  Blood loss anemia Post op, monitor h&h  ARF Likely post op, monitor bmp. Hydration encouraged  insomnia Continue ambien 10 mg qhs  Depression continue abilify 5 mg daily  Constipation' Stable, continue colace 100 mg bid  OA Continue oscal, vit d supplement and glucosamine  Hypothyroidism Stable, continue home regimen levothyroxine 75 mcg daily  Goals of care: short term rehabilitation   Labs/tests ordered: cbc, bmp  Family/ staff Communication: reviewed care plan with patient and nursing supervisor    Blanchie Serve, MD  [facility] [phone] (Monday-Friday 8 am - 5 pm) [phone] (afterhours)

## 2015-03-16 DIAGNOSIS — D62 Acute posthemorrhagic anemia: Secondary | ICD-10-CM | POA: Insufficient documentation

## 2015-03-16 NOTE — Progress Notes (Signed)
Patient ID: [name] [name], female   DOB: November 12, 1932, 81 y.o.   MRN: [id number]   03/10/15  Facility:  Nursing Home Location:  [place] Room Number: 103-P LEVEL OF CARE:  SNF (31)    Chief Complaint  Patient presents with  . Hospitalization Follow-up    Osteoarthritis S/P right total knee arthroplasty, insomnia, psoriasis, bipolar disorder, hypothyroidism, anemia and leukocytosis    HISTORY OF PRESENT ILLNESS:  This is a 81 year old female who has been admitted to [facility] on 03/07/15 from [facility] with osteoarthritis S/P right total knee arthroplasty. She has PMH of hypothyroidism, insomnia, psoriasis, eczema, osteoporosis, bipolar disorder and depression.  She has been admitted for a short-term rehabilitation.  PAST MEDICAL HISTORY:  Past Medical History  Diagnosis Date  . Hypothyroidism   . Insomnia   . Psoriasis   . Eczema   . Osteoporosis   . Bipolar disorder   . Osteoarthritis   . Depression     bipolar  . Anemia     h/o fibroids    CURRENT MEDICATIONS: Reviewed per MAR/see medication list  Allergies  Allergen Reactions  . Bupropion Hcl Rash     REVIEW OF SYSTEMS:  GENERAL: no change in appetite, no fatigue, no weight changes, no fever, chills or weakness RESPIRATORY: no cough, SOB, DOE, wheezing, hemoptysis CARDIAC: no chest pain, edema or palpitations GI: no abdominal pain, diarrhea, constipation, heart burn, nausea or vomiting  PHYSICAL EXAMINATION  GENERAL: no acute distress, normal body habitus SKIN:  Right knee has aquacel dressing, no erythema, dry, RLE dry skin EYES: conjunctivae normal, sclerae normal, normal eye lids NECK: supple, trachea midline, no neck masses, no thyroid tenderness, no thyromegaly LYMPHATICS: no LAN in the neck, no supraclavicular LAN RESPIRATORY: breathing is even & unlabored, BS CTAB CARDIAC: RRR, no murmur,no extra heart sounds, no edema GI: abdomen soft, normal BS, no  masses, no tenderness, no hepatomegaly, no splenomegaly EXTREMITIES: Able to move 4 extremities PSYCHIATRIC: the patient is alert & oriented to person, affect & behavior appropriate  LABS/RADIOLOGY: Labs reviewed: Basic Metabolic Panel:  Recent Labs  02/10/15 1638 02/28/15 1426 03/05/15 0440  NA 143 140 138  K 3.9 3.9 3.9  CL 104 103 99*  CO2 29 25 29   GLUCOSE 95 115* 137*  BUN 10 10 10   CREATININE 0.83 0.85 1.04*  CALCIUM 10.2 9.7 8.8*   Liver Function Tests:  Recent Labs  02/10/15 1638  AST 23  ALT 19  ALKPHOS 33*  BILITOT 0.4  PROT 7.8  ALBUMIN 4.5   CBC:  Recent Labs  02/10/15 1638  03/05/15 0440 03/06/15 0552 03/07/15 0430  WBC 7.4  < > 12.2* 13.0* 12.2*  NEUTROABS 4.1  --   --   --   --   HGB 14.3  < > 11.7* 9.7* 9.3*  HCT 42.5  < > 35.9* 28.4* 28.6*  MCV 91.7  < > 93.2 91.9 94.1  PLT 269.0  < > 231 178 198  < > = values in this interval not displayed.   Dg Knee Right Port  03/04/2015   CLINICAL DATA:  Status post right knee arthroplasty.  EXAM: PORTABLE RIGHT KNEE - 1-2 VIEW  COMPARISON:  10/15/2014  FINDINGS: Radiographs show normal alignment of a total right knee arthroplasty. No abnormal lucency or fracture is identified.  IMPRESSION: Normal alignment of right knee arthroplasty.   Electronically Signed   By: Aletta Edouard M.D.   On: 03/04/2015 15:09  ASSESSMENT/PLAN:  Osteoarthritis S/P right total knee arthroplasty - for rehabilitation; continue Robaxin 500 mg 1 tab by mouth every 6 hours when necessary for muscle spasm; Xarelto 10 mg by mouth daily for DVT prophylaxis; and follow-up with Dr. Ninfa Linden, orthopedic surgeon Insomnia - continue Ambien 10 mg by mouth daily at bedtime Bipolar disorder - continue Abilify 5 mg by mouth daily Hypothyroidism - TSH 2.11; continue Synthroid 75 g by mouth daily Anemia, acute blood loss - hemoglobin 9.3; will monitor Leukocytosis - wbc 12.2; will monitor   Goals of care:  Short-term  rehabilitation   Labs/test ordered:  BMP, CBC  Spent 50 minutes in patient care.    [facility], NP [employer] [phone]

## 2015-03-19 DIAGNOSIS — E43 Unspecified severe protein-calorie malnutrition: Secondary | ICD-10-CM | POA: Diagnosis not present

## 2015-03-19 DIAGNOSIS — Z96651 Presence of right artificial knee joint: Secondary | ICD-10-CM | POA: Diagnosis not present

## 2015-03-19 DIAGNOSIS — M6281 Muscle weakness (generalized): Secondary | ICD-10-CM | POA: Diagnosis not present

## 2015-03-19 DIAGNOSIS — G4700 Insomnia, unspecified: Secondary | ICD-10-CM | POA: Diagnosis not present

## 2015-03-19 DIAGNOSIS — M1711 Unilateral primary osteoarthritis, right knee: Secondary | ICD-10-CM | POA: Diagnosis not present

## 2015-03-19 DIAGNOSIS — D62 Acute posthemorrhagic anemia: Secondary | ICD-10-CM | POA: Diagnosis not present

## 2015-03-19 DIAGNOSIS — Z471 Aftercare following joint replacement surgery: Secondary | ICD-10-CM | POA: Diagnosis not present

## 2015-03-19 DIAGNOSIS — M1991 Primary osteoarthritis, unspecified site: Secondary | ICD-10-CM | POA: Diagnosis not present

## 2015-03-19 DIAGNOSIS — R278 Other lack of coordination: Secondary | ICD-10-CM | POA: Diagnosis not present

## 2015-03-19 DIAGNOSIS — F319 Bipolar disorder, unspecified: Secondary | ICD-10-CM | POA: Diagnosis not present

## 2015-03-19 DIAGNOSIS — E039 Hypothyroidism, unspecified: Secondary | ICD-10-CM | POA: Diagnosis not present

## 2015-03-19 DIAGNOSIS — L409 Psoriasis, unspecified: Secondary | ICD-10-CM | POA: Diagnosis not present

## 2015-03-19 DIAGNOSIS — F329 Major depressive disorder, single episode, unspecified: Secondary | ICD-10-CM | POA: Diagnosis not present

## 2015-03-19 DIAGNOSIS — K589 Irritable bowel syndrome without diarrhea: Secondary | ICD-10-CM | POA: Diagnosis not present

## 2015-03-19 DIAGNOSIS — R2681 Unsteadiness on feet: Secondary | ICD-10-CM | POA: Diagnosis not present

## 2015-03-20 ENCOUNTER — Encounter: Payer: Self-pay | Admitting: Adult Health

## 2015-03-20 ENCOUNTER — Non-Acute Institutional Stay (SKILLED_NURSING_FACILITY): Payer: Medicare Other | Admitting: Adult Health

## 2015-03-20 DIAGNOSIS — E039 Hypothyroidism, unspecified: Secondary | ICD-10-CM

## 2015-03-20 DIAGNOSIS — D62 Acute posthemorrhagic anemia: Secondary | ICD-10-CM | POA: Diagnosis not present

## 2015-03-20 DIAGNOSIS — E43 Unspecified severe protein-calorie malnutrition: Secondary | ICD-10-CM

## 2015-03-20 DIAGNOSIS — G4700 Insomnia, unspecified: Secondary | ICD-10-CM | POA: Diagnosis not present

## 2015-03-20 DIAGNOSIS — M1711 Unilateral primary osteoarthritis, right knee: Secondary | ICD-10-CM

## 2015-03-20 DIAGNOSIS — F319 Bipolar disorder, unspecified: Secondary | ICD-10-CM | POA: Diagnosis not present

## 2015-03-20 DIAGNOSIS — L409 Psoriasis, unspecified: Secondary | ICD-10-CM | POA: Diagnosis not present

## 2015-03-20 NOTE — Progress Notes (Signed)
Patient ID: [name] [name], female   DOB: 14-Jun-1933, 81 y.o.   MRN: [id number]   03/20/15  Facility:  Nursing Home Location:  [place] Room Number: 103-P LEVEL OF CARE:  SNF (31)   Chief Complaint  Patient presents with  . Discharge Note    Osteoarthritis S/P right total knee arthroplasty, psoriasis, insomnia, bipolar affective disorder, hypothyroidism, anemia and protein calorie malnutrition    HISTORY OF PRESENT ILLNESS:  This is a 81 year old female who is for discharge home with home health PT for endurance, OT for ADLs and CNA for showers.  DME: Bedside commode . She has been admitted to [facility] on 03/07/15 from [facility] with osteoarthritis S/P right total knee arthroplasty. She has PMH of hypothyroidism, insomnia, psoriasis, eczema, osteoporosis, bipolar disorder and depression.  Patient was admitted to this facility for short-term rehabilitation after the patient's recent hospitalization.  Patient has completed SNF rehabilitation and therapy has cleared the patient for discharge.  PAST MEDICAL HISTORY:  Past Medical History  Diagnosis Date  . Hypothyroidism   . Insomnia   . Psoriasis   . Eczema   . Osteoporosis   . Bipolar disorder   . Osteoarthritis   . Depression     bipolar  . Anemia     h/o fibroids    CURRENT MEDICATIONS: Reviewed per MAR/see medication list  Allergies  Allergen Reactions  . Bupropion Hcl Rash     REVIEW OF SYSTEMS:  GENERAL: no change in appetite, no fatigue, no weight changes, no fever, chills or weakness RESPIRATORY: no cough, SOB, DOE, wheezing, hemoptysis CARDIAC: no chest pain, edema or palpitations GI: no abdominal pain, diarrhea, constipation, heart burn, nausea or vomiting  PHYSICAL EXAMINATION  GENERAL: no acute distress, normal body habitus SKIN:  Right knee surgical incision is dry, no redness, RLE dry skin NECK: supple, trachea midline, no neck masses, no thyroid tenderness,  no thyromegaly LYMPHATICS: no LAN in the neck, no supraclavicular LAN RESPIRATORY: breathing is even & unlabored, BS CTAB CARDIAC: RRR, no murmur,no extra heart sounds, no edema GI: abdomen soft, normal BS, no masses, no tenderness, no hepatomegaly, no splenomegaly EXTREMITIES: Able to move 4 extremities PSYCHIATRIC: the patient is alert & oriented to person, affect & behavior appropriate  LABS/RADIOLOGY: Labs reviewed: 03/19/15  WBC 8.6 hemoglobin 10.0 hematocrit 30.3 MCV 93.5 03/11/15  WBC 11.9 hemoglobin 9.3 hematocrit 28.0 MCV 90.0 sodium 138 potassium 3.9 glucose 130 BUN 13 creatinine 0.63 alkaline phosphatase 39 SGOT 56 SGPT 40 total protein 6.0 albumin 3.0 calcium 8.3 Basic Metabolic Panel:  Recent Labs  02/10/15 1638 02/28/15 1426 03/05/15 0440  NA 143 140 138  K 3.9 3.9 3.9  CL 104 103 99*  CO2 29 25 29   GLUCOSE 95 115* 137*  BUN 10 10 10   CREATININE 0.83 0.85 1.04*  CALCIUM 10.2 9.7 8.8*   Liver Function Tests:  Recent Labs  02/10/15 1638  AST 23  ALT 19  ALKPHOS 33*  BILITOT 0.4  PROT 7.8  ALBUMIN 4.5   CBC:  Recent Labs  02/10/15 1638  03/05/15 0440 03/06/15 0552 03/07/15 0430  WBC 7.4  < > 12.2* 13.0* 12.2*  NEUTROABS 4.1  --   --   --   --   HGB 14.3  < > 11.7* 9.7* 9.3*  HCT 42.5  < > 35.9* 28.4* 28.6*  MCV 91.7  < > 93.2 91.9 94.1  PLT 269.0  < > 231 178 198  < > =  values in this interval not displayed.   Dg Knee Right Port  03/04/2015   CLINICAL DATA:  Status post right knee arthroplasty.  EXAM: PORTABLE RIGHT KNEE - 1-2 VIEW  COMPARISON:  10/15/2014  FINDINGS: Radiographs show normal alignment of a total right knee arthroplasty. No abnormal lucency or fracture is identified.  IMPRESSION: Normal alignment of right knee arthroplasty.   Electronically Signed   By: [name] [name] M.D.   On: 03/04/2015 15:09    ASSESSMENT/PLAN:  Osteoarthritis S/P right total knee arthroplasty - for home health PT, OT and CNA; continue Robaxin 500 mg 1 tab  by mouth every 6 hours when necessary for muscle spasm; Xarelto 10 mg by mouth daily for DVT prophylaxis; and follow-up with Dr. Ninfa Linden, orthopedic surgeon Insomnia - continue Ambien 10 mg by mouth daily at bedtime Bipolar disorder - continue Abilify 5 mg by mouth daily Hypothyroidism - TSH 2.11; continue Synthroid 75 g by mouth daily Anemia, acute blood loss - hemoglobin 10.0; stable Protein-calorie malnutrition, severe - albumin 3.0; continue supplementation    I have filled out patient's discharge paperwork and written prescriptions.  Patient will receive home health PT, OT and CNA.  DME provided: Bedside commode  Total discharge time: Greater than 30 minutes  Discharge time involved coordination of the discharge process with social worker, nursing staff and therapy department. Medical justification for home health services/DME verified.   [facility], NP [employer] [phone]

## 2015-03-23 DIAGNOSIS — Z471 Aftercare following joint replacement surgery: Secondary | ICD-10-CM | POA: Diagnosis not present

## 2015-03-23 DIAGNOSIS — Z87891 Personal history of nicotine dependence: Secondary | ICD-10-CM | POA: Diagnosis not present

## 2015-03-23 DIAGNOSIS — F319 Bipolar disorder, unspecified: Secondary | ICD-10-CM | POA: Diagnosis not present

## 2015-03-23 DIAGNOSIS — M810 Age-related osteoporosis without current pathological fracture: Secondary | ICD-10-CM | POA: Diagnosis not present

## 2015-03-23 DIAGNOSIS — Z96651 Presence of right artificial knee joint: Secondary | ICD-10-CM | POA: Diagnosis not present

## 2015-03-23 DIAGNOSIS — M1990 Unspecified osteoarthritis, unspecified site: Secondary | ICD-10-CM | POA: Diagnosis not present

## 2015-03-24 DIAGNOSIS — Z471 Aftercare following joint replacement surgery: Secondary | ICD-10-CM | POA: Diagnosis not present

## 2015-03-24 DIAGNOSIS — M1990 Unspecified osteoarthritis, unspecified site: Secondary | ICD-10-CM | POA: Diagnosis not present

## 2015-03-24 DIAGNOSIS — F319 Bipolar disorder, unspecified: Secondary | ICD-10-CM | POA: Diagnosis not present

## 2015-03-24 DIAGNOSIS — Z96651 Presence of right artificial knee joint: Secondary | ICD-10-CM | POA: Diagnosis not present

## 2015-03-24 DIAGNOSIS — Z87891 Personal history of nicotine dependence: Secondary | ICD-10-CM | POA: Diagnosis not present

## 2015-03-24 DIAGNOSIS — M810 Age-related osteoporosis without current pathological fracture: Secondary | ICD-10-CM | POA: Diagnosis not present

## 2015-03-25 ENCOUNTER — Telehealth: Payer: Self-pay

## 2015-03-25 DIAGNOSIS — Z96651 Presence of right artificial knee joint: Secondary | ICD-10-CM | POA: Diagnosis not present

## 2015-03-25 DIAGNOSIS — R7989 Other specified abnormal findings of blood chemistry: Secondary | ICD-10-CM

## 2015-03-25 DIAGNOSIS — M810 Age-related osteoporosis without current pathological fracture: Secondary | ICD-10-CM | POA: Diagnosis not present

## 2015-03-25 DIAGNOSIS — M1990 Unspecified osteoarthritis, unspecified site: Secondary | ICD-10-CM | POA: Diagnosis not present

## 2015-03-25 DIAGNOSIS — Z87891 Personal history of nicotine dependence: Secondary | ICD-10-CM | POA: Diagnosis not present

## 2015-03-25 DIAGNOSIS — Z471 Aftercare following joint replacement surgery: Secondary | ICD-10-CM | POA: Diagnosis not present

## 2015-03-25 DIAGNOSIS — F319 Bipolar disorder, unspecified: Secondary | ICD-10-CM | POA: Diagnosis not present

## 2015-03-25 NOTE — Telephone Encounter (Signed)
Prolia benefits verified- No Prior authorization needed. Benefits subject to a $166 deductible ($166 Met) and 20% co-insurance for the administration and cost of prolia. Secondary coverage--No PA needed, coverage is for in network benefits. The secondary plan does not follow Medicare guidelines. The secondary plan will consider the Medicare part B deductible and co-insurance up to the primary plans allowable. Claims will crossover for coordination. Referral is not required. The patient will need a BMP before inject due to abnormal creatinine and calcium levels. I spoke with the patient who just had surgery, she is not able to drive at this time and she will call back in a few weeks to schedule the prolia. Please make her a lab apt prior to injection. Thanks KP

## 2015-03-26 DIAGNOSIS — M810 Age-related osteoporosis without current pathological fracture: Secondary | ICD-10-CM | POA: Diagnosis not present

## 2015-03-26 DIAGNOSIS — M1990 Unspecified osteoarthritis, unspecified site: Secondary | ICD-10-CM | POA: Diagnosis not present

## 2015-03-26 DIAGNOSIS — Z471 Aftercare following joint replacement surgery: Secondary | ICD-10-CM | POA: Diagnosis not present

## 2015-03-26 DIAGNOSIS — Z87891 Personal history of nicotine dependence: Secondary | ICD-10-CM | POA: Diagnosis not present

## 2015-03-26 DIAGNOSIS — Z96651 Presence of right artificial knee joint: Secondary | ICD-10-CM | POA: Diagnosis not present

## 2015-03-26 DIAGNOSIS — F319 Bipolar disorder, unspecified: Secondary | ICD-10-CM | POA: Diagnosis not present

## 2015-03-27 DIAGNOSIS — Z87891 Personal history of nicotine dependence: Secondary | ICD-10-CM | POA: Diagnosis not present

## 2015-03-27 DIAGNOSIS — F319 Bipolar disorder, unspecified: Secondary | ICD-10-CM | POA: Diagnosis not present

## 2015-03-27 DIAGNOSIS — Z471 Aftercare following joint replacement surgery: Secondary | ICD-10-CM | POA: Diagnosis not present

## 2015-03-27 DIAGNOSIS — Z96651 Presence of right artificial knee joint: Secondary | ICD-10-CM | POA: Diagnosis not present

## 2015-03-27 DIAGNOSIS — M810 Age-related osteoporosis without current pathological fracture: Secondary | ICD-10-CM | POA: Diagnosis not present

## 2015-03-27 DIAGNOSIS — M1990 Unspecified osteoarthritis, unspecified site: Secondary | ICD-10-CM | POA: Diagnosis not present

## 2015-03-31 ENCOUNTER — Telehealth: Payer: Self-pay | Admitting: Family Medicine

## 2015-03-31 DIAGNOSIS — Z471 Aftercare following joint replacement surgery: Secondary | ICD-10-CM | POA: Diagnosis not present

## 2015-03-31 DIAGNOSIS — Z87891 Personal history of nicotine dependence: Secondary | ICD-10-CM | POA: Diagnosis not present

## 2015-03-31 DIAGNOSIS — F319 Bipolar disorder, unspecified: Secondary | ICD-10-CM | POA: Diagnosis not present

## 2015-03-31 DIAGNOSIS — M1990 Unspecified osteoarthritis, unspecified site: Secondary | ICD-10-CM | POA: Diagnosis not present

## 2015-03-31 DIAGNOSIS — Z96651 Presence of right artificial knee joint: Secondary | ICD-10-CM | POA: Diagnosis not present

## 2015-03-31 DIAGNOSIS — M810 Age-related osteoporosis without current pathological fracture: Secondary | ICD-10-CM | POA: Diagnosis not present

## 2015-03-31 NOTE — Telephone Encounter (Signed)
Caller name: Mallory   Relation to pt: Occupational Therapist  Call back number: [phone]    Reason for call:  As per OT "part 2 elevated BP reading:"  03/26/15 160/80 03/31/15 176/80  OT states you do not have to call back if you do not have any questions or concerns.

## 2015-03-31 NOTE — Telephone Encounter (Signed)
FYI for MD.      KP 

## 2015-03-31 NOTE — Telephone Encounter (Signed)
Pt should have ov

## 2015-04-01 DIAGNOSIS — F319 Bipolar disorder, unspecified: Secondary | ICD-10-CM | POA: Diagnosis not present

## 2015-04-01 DIAGNOSIS — Z96651 Presence of right artificial knee joint: Secondary | ICD-10-CM | POA: Diagnosis not present

## 2015-04-01 DIAGNOSIS — M1990 Unspecified osteoarthritis, unspecified site: Secondary | ICD-10-CM | POA: Diagnosis not present

## 2015-04-01 DIAGNOSIS — Z471 Aftercare following joint replacement surgery: Secondary | ICD-10-CM | POA: Diagnosis not present

## 2015-04-01 DIAGNOSIS — Z87891 Personal history of nicotine dependence: Secondary | ICD-10-CM | POA: Diagnosis not present

## 2015-04-01 DIAGNOSIS — M810 Age-related osteoporosis without current pathological fracture: Secondary | ICD-10-CM | POA: Diagnosis not present

## 2015-04-01 NOTE — Telephone Encounter (Signed)
Please offer this patient an apt for her elevated BP.     [name] [name]

## 2015-04-02 DIAGNOSIS — Z87891 Personal history of nicotine dependence: Secondary | ICD-10-CM | POA: Diagnosis not present

## 2015-04-02 DIAGNOSIS — F319 Bipolar disorder, unspecified: Secondary | ICD-10-CM | POA: Diagnosis not present

## 2015-04-02 DIAGNOSIS — M810 Age-related osteoporosis without current pathological fracture: Secondary | ICD-10-CM | POA: Diagnosis not present

## 2015-04-02 DIAGNOSIS — Z96651 Presence of right artificial knee joint: Secondary | ICD-10-CM | POA: Diagnosis not present

## 2015-04-02 DIAGNOSIS — M1990 Unspecified osteoarthritis, unspecified site: Secondary | ICD-10-CM | POA: Diagnosis not present

## 2015-04-02 DIAGNOSIS — Z471 Aftercare following joint replacement surgery: Secondary | ICD-10-CM | POA: Diagnosis not present

## 2015-04-02 NOTE — Telephone Encounter (Signed)
Pt states she is not driving and she had surgery can not schedule appointment at this time.

## 2015-04-02 NOTE — Telephone Encounter (Signed)
Does bp con't to be high

## 2015-04-03 NOTE — Telephone Encounter (Signed)
Spoke with Mallory (OT) and she states she has discharged pt. PT still has one visit with pt and she will send message to have them contact us with pt's BP reading. She states that pt is supposed to start outpt therapy next week and may be able to drive by then. Awaiting call from PT.

## 2015-04-04 DIAGNOSIS — M810 Age-related osteoporosis without current pathological fracture: Secondary | ICD-10-CM | POA: Diagnosis not present

## 2015-04-04 DIAGNOSIS — Z87891 Personal history of nicotine dependence: Secondary | ICD-10-CM | POA: Diagnosis not present

## 2015-04-04 DIAGNOSIS — M1990 Unspecified osteoarthritis, unspecified site: Secondary | ICD-10-CM | POA: Diagnosis not present

## 2015-04-04 DIAGNOSIS — Z96651 Presence of right artificial knee joint: Secondary | ICD-10-CM | POA: Diagnosis not present

## 2015-04-04 DIAGNOSIS — F319 Bipolar disorder, unspecified: Secondary | ICD-10-CM | POA: Diagnosis not present

## 2015-04-04 DIAGNOSIS — Z471 Aftercare following joint replacement surgery: Secondary | ICD-10-CM | POA: Diagnosis not present

## 2015-04-07 DIAGNOSIS — Z96651 Presence of right artificial knee joint: Secondary | ICD-10-CM | POA: Diagnosis not present

## 2015-04-07 DIAGNOSIS — M810 Age-related osteoporosis without current pathological fracture: Secondary | ICD-10-CM | POA: Diagnosis not present

## 2015-04-07 DIAGNOSIS — M1990 Unspecified osteoarthritis, unspecified site: Secondary | ICD-10-CM | POA: Diagnosis not present

## 2015-04-07 DIAGNOSIS — Z471 Aftercare following joint replacement surgery: Secondary | ICD-10-CM | POA: Diagnosis not present

## 2015-04-07 DIAGNOSIS — Z87891 Personal history of nicotine dependence: Secondary | ICD-10-CM | POA: Diagnosis not present

## 2015-04-07 DIAGNOSIS — F319 Bipolar disorder, unspecified: Secondary | ICD-10-CM | POA: Diagnosis not present

## 2015-04-09 DIAGNOSIS — Z96651 Presence of right artificial knee joint: Secondary | ICD-10-CM | POA: Diagnosis not present

## 2015-04-09 DIAGNOSIS — Z471 Aftercare following joint replacement surgery: Secondary | ICD-10-CM | POA: Diagnosis not present

## 2015-04-09 DIAGNOSIS — M810 Age-related osteoporosis without current pathological fracture: Secondary | ICD-10-CM | POA: Diagnosis not present

## 2015-04-09 DIAGNOSIS — F319 Bipolar disorder, unspecified: Secondary | ICD-10-CM | POA: Diagnosis not present

## 2015-04-09 DIAGNOSIS — Z87891 Personal history of nicotine dependence: Secondary | ICD-10-CM | POA: Diagnosis not present

## 2015-04-09 DIAGNOSIS — M1990 Unspecified osteoarthritis, unspecified site: Secondary | ICD-10-CM | POA: Diagnosis not present

## 2015-04-11 DIAGNOSIS — M1990 Unspecified osteoarthritis, unspecified site: Secondary | ICD-10-CM | POA: Diagnosis not present

## 2015-04-11 DIAGNOSIS — F319 Bipolar disorder, unspecified: Secondary | ICD-10-CM | POA: Diagnosis not present

## 2015-04-11 DIAGNOSIS — Z96651 Presence of right artificial knee joint: Secondary | ICD-10-CM | POA: Diagnosis not present

## 2015-04-11 DIAGNOSIS — M810 Age-related osteoporosis without current pathological fracture: Secondary | ICD-10-CM | POA: Diagnosis not present

## 2015-04-11 DIAGNOSIS — Z87891 Personal history of nicotine dependence: Secondary | ICD-10-CM | POA: Diagnosis not present

## 2015-04-11 DIAGNOSIS — Z471 Aftercare following joint replacement surgery: Secondary | ICD-10-CM | POA: Diagnosis not present

## 2015-04-15 DIAGNOSIS — M1990 Unspecified osteoarthritis, unspecified site: Secondary | ICD-10-CM | POA: Diagnosis not present

## 2015-04-15 DIAGNOSIS — M810 Age-related osteoporosis without current pathological fracture: Secondary | ICD-10-CM | POA: Diagnosis not present

## 2015-04-15 DIAGNOSIS — Z87891 Personal history of nicotine dependence: Secondary | ICD-10-CM | POA: Diagnosis not present

## 2015-04-15 DIAGNOSIS — F319 Bipolar disorder, unspecified: Secondary | ICD-10-CM | POA: Diagnosis not present

## 2015-04-15 DIAGNOSIS — Z471 Aftercare following joint replacement surgery: Secondary | ICD-10-CM | POA: Diagnosis not present

## 2015-04-15 DIAGNOSIS — Z96651 Presence of right artificial knee joint: Secondary | ICD-10-CM | POA: Diagnosis not present

## 2015-04-17 DIAGNOSIS — M810 Age-related osteoporosis without current pathological fracture: Secondary | ICD-10-CM | POA: Diagnosis not present

## 2015-04-17 DIAGNOSIS — M1990 Unspecified osteoarthritis, unspecified site: Secondary | ICD-10-CM | POA: Diagnosis not present

## 2015-04-17 DIAGNOSIS — Z87891 Personal history of nicotine dependence: Secondary | ICD-10-CM | POA: Diagnosis not present

## 2015-04-17 DIAGNOSIS — Z96651 Presence of right artificial knee joint: Secondary | ICD-10-CM | POA: Diagnosis not present

## 2015-04-17 DIAGNOSIS — Z471 Aftercare following joint replacement surgery: Secondary | ICD-10-CM | POA: Diagnosis not present

## 2015-04-17 DIAGNOSIS — F319 Bipolar disorder, unspecified: Secondary | ICD-10-CM | POA: Diagnosis not present

## 2015-04-18 DIAGNOSIS — Z87891 Personal history of nicotine dependence: Secondary | ICD-10-CM | POA: Diagnosis not present

## 2015-04-18 DIAGNOSIS — Z96651 Presence of right artificial knee joint: Secondary | ICD-10-CM | POA: Diagnosis not present

## 2015-04-18 DIAGNOSIS — Z471 Aftercare following joint replacement surgery: Secondary | ICD-10-CM | POA: Diagnosis not present

## 2015-04-18 DIAGNOSIS — F319 Bipolar disorder, unspecified: Secondary | ICD-10-CM | POA: Diagnosis not present

## 2015-04-18 DIAGNOSIS — M810 Age-related osteoporosis without current pathological fracture: Secondary | ICD-10-CM | POA: Diagnosis not present

## 2015-04-18 DIAGNOSIS — M1990 Unspecified osteoarthritis, unspecified site: Secondary | ICD-10-CM | POA: Diagnosis not present

## 2015-04-29 DIAGNOSIS — F319 Bipolar disorder, unspecified: Secondary | ICD-10-CM | POA: Diagnosis not present

## 2015-05-09 ENCOUNTER — Ambulatory Visit (INDEPENDENT_AMBULATORY_CARE_PROVIDER_SITE_OTHER): Payer: Medicare Other | Admitting: Podiatry

## 2015-05-09 VITALS — BP 148/81 | HR 80 | Resp 16

## 2015-05-09 DIAGNOSIS — M79672 Pain in left foot: Secondary | ICD-10-CM

## 2015-05-09 DIAGNOSIS — M79673 Pain in unspecified foot: Secondary | ICD-10-CM | POA: Diagnosis not present

## 2015-05-09 DIAGNOSIS — M79671 Pain in right foot: Secondary | ICD-10-CM

## 2015-05-09 DIAGNOSIS — L84 Corns and callosities: Secondary | ICD-10-CM

## 2015-05-09 DIAGNOSIS — B351 Tinea unguium: Secondary | ICD-10-CM | POA: Diagnosis not present

## 2015-05-10 NOTE — Progress Notes (Signed)
Subjective:     Patient ID: [name] [name], female   DOB: Apr 15, 1933, 81 y.o.   MRN: [id number]  HPI This patients presents to office for preventive foot care services.  She sevelops callus on the ball of both feet which are very painful when walking.  She has also developed long nails since her last visit.  She presents for preventive foot care services.  Review of Systems     Objective:   Physical Exam GENERAL APPEARANCE: Alert, conversant. Appropriately groomed. No acute distress.  VASCULAR: Pedal pulses palpable at 2/4 DP and PT bilateral.  Capillary refill time is immediate to all digits,  Proximal to distal cooling it warm to warm.  Digital hair growth is present bilateral  NEUROLOGIC: sensation is intact epicritically and protectively to 5.07 monofilament at 5/5 sites bilateral.  Light touch is intact bilateral, vibratory sensation intact bilateral, achilles tendon reflex is intact bilateral.  MUSCULOSKELETAL: acceptable muscle strength, tone and stability bilateral.  Intrinsic muscluature intact bilateral.  Rectus appearance of foot and digits noted bilateral.   DERMATOLOGIC: skin color, texture, and turgor are within normal limits.  No preulcerative lesions or ulcers  are seen, no interdigital maceration noted.  No open lesions present.  Digital nails are asymptomatic. No drainage noted. Callus sub5th metatarsal both feet. Callus left forefoot.      Assessment:     Callus  B/L.  Onychomycosis     Plan:     Debride Nails.  Debride corns and calluses

## 2015-05-14 ENCOUNTER — Encounter: Payer: Self-pay | Admitting: Podiatry

## 2015-05-22 ENCOUNTER — Encounter: Payer: Self-pay | Admitting: Gastroenterology

## 2015-07-14 ENCOUNTER — Telehealth: Payer: Self-pay | Admitting: Family Medicine

## 2015-07-14 NOTE — Telephone Encounter (Signed)
Relation to ZZ:CKIC Call back number: [phone]    Reason for call:  Patient states as per Dillon Bjork is needed for prolia injection  [employer] #  [phone]

## 2015-07-16 NOTE — Telephone Encounter (Signed)
FYI

## 2015-07-17 ENCOUNTER — Ambulatory Visit (INDEPENDENT_AMBULATORY_CARE_PROVIDER_SITE_OTHER): Payer: Medicare Other | Admitting: Podiatry

## 2015-07-17 ENCOUNTER — Encounter: Payer: Self-pay | Admitting: Podiatry

## 2015-07-17 DIAGNOSIS — L84 Corns and callosities: Secondary | ICD-10-CM | POA: Diagnosis not present

## 2015-07-17 DIAGNOSIS — M79671 Pain in right foot: Secondary | ICD-10-CM

## 2015-07-17 DIAGNOSIS — B351 Tinea unguium: Secondary | ICD-10-CM

## 2015-07-17 DIAGNOSIS — M79672 Pain in left foot: Secondary | ICD-10-CM | POA: Diagnosis not present

## 2015-07-17 NOTE — Progress Notes (Signed)
Subjective:     Patient ID: [name] [name], female   DOB: Jul 16, 1933, 81 y.o.   MRN: [id number]  HPI This patients presents to office for preventive foot care services.  She sevelops callus on the ball of both feet which are very painful when walking.  She has also developed long nails since her last visit.  She presents for preventive foot care services.  Review of Systems     Objective:   Physical Exam GENERAL APPEARANCE: Alert, conversant. Appropriately groomed. No acute distress.  VASCULAR: Pedal pulses palpable at 2/4 DP and PT bilateral.  Capillary refill time is immediate to all digits,  Proximal to distal cooling it warm to warm.  Digital hair growth is present bilateral  NEUROLOGIC: sensation is intact epicritically and protectively to 5.07 monofilament at 5/5 sites bilateral.  Light touch is intact bilateral, vibratory sensation intact bilateral, achilles tendon reflex is intact bilateral.  MUSCULOSKELETAL: acceptable muscle strength, tone and stability bilateral.  Intrinsic muscluature intact bilateral.  Rectus appearance of foot and digits noted bilateral.   DERMATOLOGIC: skin color, texture, and turgor are within normal limits.  No preulcerative lesions or ulcers  are seen, no interdigital maceration noted.  No open lesions present.  Digital nails are asymptomatic. No drainage noted. Callus sub5th metatarsal both feet. Callus left forefoot.      Assessment:     Callus  B/L.  Onychomycosis     Plan:     Debride Nails.  Debride corns and calluses

## 2015-07-21 NOTE — Telephone Encounter (Signed)
Prolia benefits verified- No Prior authorization needed. Benefits subject to a $166 deductible ($166 Met) and 20% co-insurance for the administration and cost of prolia. Secondary coverage--No PA needed, coverage is for in network benefits. The secondary plan does not follow Medicare guidelines. The secondary plan will consider the Medicare part B deductible and co-insurance up to the primary plans allowable. Claims will crossover for coordination. Referral is not required. The patient will need a BMP before inject due to abnormal creatinine and calcium levels. I spoke with the patient who just had surgery, she is not able to drive at this time and she will call back in a few weeks to schedule the prolia. Please make her a lab apt prior to injection. Thanks KP      

## 2015-07-24 NOTE — Telephone Encounter (Signed)
Prolia benefits verified- No Prior authorization needed. Benefits subject to a $166 deductible ($166 Met) and 20% co-insurance for the administration and cost of prolia. Secondary coverage--No PA needed, coverage is for in network benefits. The secondary plan does not follow Medicare guidelines. The secondary plan will consider the Medicare part B deductible and co-insurance up to the primary plans allowable. Claims will crossover for coordination. Referral is not required. The patient will need a BMP before inject due to abnormal creatinine and calcium levels. PLease schedule the BMP and then the Prolia 2-3 days later. Thanks     KP

## 2015-07-25 NOTE — Telephone Encounter (Signed)
Patient scheduled BMP 07/28/15 lab only and 07/31/15 with nurse for prolia injection

## 2015-07-28 ENCOUNTER — Other Ambulatory Visit (INDEPENDENT_AMBULATORY_CARE_PROVIDER_SITE_OTHER): Payer: Medicare Other

## 2015-07-28 DIAGNOSIS — M810 Age-related osteoporosis without current pathological fracture: Secondary | ICD-10-CM

## 2015-07-28 DIAGNOSIS — R748 Abnormal levels of other serum enzymes: Secondary | ICD-10-CM

## 2015-07-28 DIAGNOSIS — R7989 Other specified abnormal findings of blood chemistry: Secondary | ICD-10-CM

## 2015-07-28 LAB — BASIC METABOLIC PANEL
BUN: 13 mg/dL (ref 6–23)
CO2: 29 mEq/L (ref 19–32)
Calcium: 10.3 mg/dL (ref 8.4–10.5)
Chloride: 101 mEq/L (ref 96–112)
Creatinine, Ser: 0.88 mg/dL (ref 0.40–1.20)
GFR: 65.7 mL/min (ref >60.00)
Glucose, Bld: 115 mg/dL — ABNORMAL HIGH (ref 70–99)
Potassium: 4.3 mEq/L (ref 3.5–5.1)
Sodium: 138 mEq/L (ref 135–145)

## 2015-07-31 ENCOUNTER — Ambulatory Visit (INDEPENDENT_AMBULATORY_CARE_PROVIDER_SITE_OTHER): Payer: Medicare Other

## 2015-07-31 VITALS — BP 132/87 | HR 87 | Temp 99.6°F

## 2015-07-31 DIAGNOSIS — M810 Age-related osteoporosis without current pathological fracture: Secondary | ICD-10-CM

## 2015-07-31 MED ORDER — DENOSUMAB 60 MG/ML ~~LOC~~ SOLN
60.0000 mg | Freq: Once | SUBCUTANEOUS | Status: AC
  Administered 2015-07-31: 60 mg via SUBCUTANEOUS

## 2015-07-31 NOTE — Progress Notes (Signed)
Pre visit review using our clinic review tool, if applicable. No additional management support is needed unless otherwise documented below in the visit note.  Patient in for Prolia injection. Given IM Left deltoid.

## 2015-08-20 ENCOUNTER — Other Ambulatory Visit: Payer: Self-pay

## 2015-08-20 MED ORDER — LEVOTHYROXINE SODIUM 75 MCG PO TABS: ORAL_TABLET | ORAL | Status: DC

## 2015-10-01 ENCOUNTER — Encounter: Payer: Self-pay | Admitting: Podiatry

## 2015-10-01 ENCOUNTER — Ambulatory Visit (INDEPENDENT_AMBULATORY_CARE_PROVIDER_SITE_OTHER): Payer: Medicare Other | Admitting: Podiatry

## 2015-10-01 DIAGNOSIS — M79671 Pain in right foot: Secondary | ICD-10-CM | POA: Diagnosis not present

## 2015-10-01 DIAGNOSIS — B351 Tinea unguium: Secondary | ICD-10-CM

## 2015-10-01 DIAGNOSIS — M79672 Pain in left foot: Secondary | ICD-10-CM | POA: Diagnosis not present

## 2015-10-01 DIAGNOSIS — Q828 Other specified congenital malformations of skin: Secondary | ICD-10-CM

## 2015-10-01 NOTE — Progress Notes (Signed)
Subjective:     Patient ID: [name] [name], female   DOB: 03/28/1933, 81 y.o.   MRN: [id number]  HPI This patients presents to office for preventive foot care services.  She sevelops callus on the ball of both feet which are very painful when walking.  She has also developed long nails since her last visit.  She presents for preventive foot care services.  Review of Systems     Objective:   Physical Exam GENERAL APPEARANCE: Alert, conversant. Appropriately groomed. No acute distress.  VASCULAR: Pedal pulses palpable at 2/4 DP and PT bilateral.  Capillary refill time is immediate to all digits,  Proximal to distal cooling it warm to warm.  Digital hair growth is present bilateral  NEUROLOGIC: sensation is intact epicritically and protectively to 5.07 monofilament at 5/5 sites bilateral.  Light touch is intact bilateral, vibratory sensation intact bilateral, achilles tendon reflex is intact bilateral.  MUSCULOSKELETAL: acceptable muscle strength, tone and stability bilateral.  Intrinsic muscluature intact bilateral.  Rectus appearance of foot and digits noted bilateral.   DERMATOLOGIC: skin color, texture, and turgor are within normal limits.  No preulcerative lesions or ulcers  are seen, no interdigital maceration noted.  No open lesions present.  Digital nails are asymptomatic. No drainage noted. Callus sub5th metatarsal both feet. Callus left forefoot.      Assessment:     Callus  B/L.  Onychomycosis     Plan:     Debride Nails.  Debride corns and calluses   Gardiner Barefoot DPM

## 2015-10-02 DIAGNOSIS — M810 Age-related osteoporosis without current pathological fracture: Secondary | ICD-10-CM | POA: Diagnosis not present

## 2015-10-02 DIAGNOSIS — Z1231 Encounter for screening mammogram for malignant neoplasm of breast: Secondary | ICD-10-CM | POA: Diagnosis not present

## 2015-10-02 LAB — HM DEXA SCAN

## 2015-10-22 ENCOUNTER — Encounter: Payer: Self-pay | Admitting: Family Medicine

## 2015-10-28 DIAGNOSIS — F319 Bipolar disorder, unspecified: Secondary | ICD-10-CM | POA: Diagnosis not present

## 2015-11-12 ENCOUNTER — Telehealth: Payer: Self-pay | Admitting: Family Medicine

## 2015-11-12 DIAGNOSIS — Z96651 Presence of right artificial knee joint: Secondary | ICD-10-CM | POA: Diagnosis not present

## 2015-11-12 DIAGNOSIS — M25561 Pain in right knee: Secondary | ICD-10-CM | POA: Diagnosis not present

## 2015-11-12 DIAGNOSIS — M1711 Unilateral primary osteoarthritis, right knee: Secondary | ICD-10-CM | POA: Diagnosis not present

## 2015-11-12 NOTE — Telephone Encounter (Signed)
Pt had flu shot 06/2015 at [facility]

## 2015-11-13 NOTE — Telephone Encounter (Signed)
Noted  KP 

## 2015-11-26 ENCOUNTER — Encounter: Payer: Self-pay | Admitting: Podiatry

## 2015-11-26 ENCOUNTER — Ambulatory Visit (INDEPENDENT_AMBULATORY_CARE_PROVIDER_SITE_OTHER): Payer: Medicare Other | Admitting: Podiatry

## 2015-11-26 DIAGNOSIS — Q828 Other specified congenital malformations of skin: Secondary | ICD-10-CM

## 2015-11-26 DIAGNOSIS — M79672 Pain in left foot: Secondary | ICD-10-CM

## 2015-11-26 DIAGNOSIS — M79671 Pain in right foot: Secondary | ICD-10-CM | POA: Diagnosis not present

## 2015-11-26 DIAGNOSIS — B351 Tinea unguium: Secondary | ICD-10-CM | POA: Diagnosis not present

## 2015-11-26 NOTE — Progress Notes (Signed)
Subjective:     Patient ID: [name] [name], female   DOB: [birth year], 82 y.o.   MRN: [id number]  HPI This patients presents to office for preventive foot care services.  She sevelops callus on the ball of both feet which are very painful when walking.  She has also developed long nails since her last visit.  She presents for preventive foot care services.  Review of Systems     Objective:   Physical Exam GENERAL APPEARANCE: Alert, conversant. Appropriately groomed. No acute distress.  VASCULAR: Pedal pulses palpable at 2/4 DP and PT bilateral.  Capillary refill time is immediate to all digits,  Proximal to distal cooling it warm to warm.  Digital hair growth is present bilateral  NEUROLOGIC: sensation is intact epicritically and protectively to 5.07 monofilament at 5/5 sites bilateral.  Light touch is intact bilateral, vibratory sensation intact bilateral, achilles tendon reflex is intact bilateral.  MUSCULOSKELETAL: acceptable muscle strength, tone and stability bilateral.  Intrinsic muscluature intact bilateral.  Rectus appearance of foot and digits noted bilateral.   DERMATOLOGIC: skin color, texture, and turgor are within normal limits.  No preulcerative lesions or ulcers  are seen, no interdigital maceration noted.  No open lesions present.  Digital nails are asymptomatic. No drainage noted. Callus sub5th metatarsal both feet. Callus left forefoot.      Assessment:     Callus  B/L.  Onychomycosis     Plan:     Debride Nails.  Debride corns and calluses   Gardiner Barefoot DPM

## 2016-01-29 ENCOUNTER — Telehealth: Payer: Self-pay | Admitting: Family Medicine

## 2016-01-29 DIAGNOSIS — Z5181 Encounter for therapeutic drug level monitoring: Secondary | ICD-10-CM

## 2016-01-29 DIAGNOSIS — M810 Age-related osteoporosis without current pathological fracture: Secondary | ICD-10-CM

## 2016-01-29 NOTE — Telephone Encounter (Signed)
Relation to [id number] Call back [phone]   Reason for call:  Patient scheduled BMP for Monday 02/09/16 at 10:30am and prolia injection for Friday 02/13/16 2:30pm

## 2016-01-29 NOTE — Telephone Encounter (Signed)
Future order entered.

## 2016-02-02 ENCOUNTER — Other Ambulatory Visit: Payer: Medicare Other

## 2016-02-04 ENCOUNTER — Ambulatory Visit (INDEPENDENT_AMBULATORY_CARE_PROVIDER_SITE_OTHER): Payer: Medicare Other | Admitting: Podiatry

## 2016-02-04 ENCOUNTER — Encounter: Payer: Self-pay | Admitting: Podiatry

## 2016-02-04 DIAGNOSIS — B351 Tinea unguium: Secondary | ICD-10-CM

## 2016-02-04 DIAGNOSIS — Q828 Other specified congenital malformations of skin: Secondary | ICD-10-CM

## 2016-02-04 NOTE — Progress Notes (Signed)
Subjective:     Patient ID: [name] [name], female   DOB: [birth year], 82 y.o.   MRN: [id number]  HPI This patients presents to office for preventive foot care services.  She sevelops callus on the ball of both feet which are very painful when walking.  She has also developed long nails since her last visit.  She presents for preventive foot care services.  Review of Systems     Objective:   Physical Exam GENERAL APPEARANCE: Alert, conversant. Appropriately groomed. No acute distress.  VASCULAR: Pedal pulses palpable at 2/4 DP and PT bilateral.  Capillary refill time is immediate to all digits,  Proximal to distal cooling it warm to warm.  Digital hair growth is present bilateral  NEUROLOGIC: sensation is intact epicritically and protectively to 5.07 monofilament at 5/5 sites bilateral.  Light touch is intact bilateral, vibratory sensation intact bilateral, achilles tendon reflex is intact bilateral.  MUSCULOSKELETAL: acceptable muscle strength, tone and stability bilateral.  Intrinsic muscluature intact bilateral.  Rectus appearance of foot and digits noted bilateral.   DERMATOLOGIC: skin color, texture, and turgor are within normal limits.  No preulcerative lesions or ulcers  are seen, no interdigital maceration noted.  No open lesions present.  . No drainage noted. Callus sub5th metatarsal both feet. Callus left forefoot. Distal clavi 3rd toe right foot.  NAILS  Thick disfigured discolored nails both feet.      Assessment:     Callus  B/L.  Onychomycosis     Plan:     Debride Nails.  Debride corns and calluses   Gardiner Barefoot DPM

## 2016-02-09 ENCOUNTER — Other Ambulatory Visit (INDEPENDENT_AMBULATORY_CARE_PROVIDER_SITE_OTHER): Payer: Medicare Other

## 2016-02-09 DIAGNOSIS — M810 Age-related osteoporosis without current pathological fracture: Secondary | ICD-10-CM

## 2016-02-09 DIAGNOSIS — Z5181 Encounter for therapeutic drug level monitoring: Secondary | ICD-10-CM

## 2016-02-09 LAB — BASIC METABOLIC PANEL
BUN: 15 mg/dL (ref 6–23)
CO2: 25 mEq/L (ref 19–32)
Calcium: 10 mg/dL (ref 8.4–10.5)
Chloride: 103 mEq/L (ref 96–112)
Creatinine, Ser: 0.91 mg/dL (ref 0.40–1.20)
GFR: 63.12 mL/min (ref >60.00)
Glucose, Bld: 96 mg/dL (ref 70–99)
Potassium: 3.7 mEq/L (ref 3.5–5.1)
Sodium: 140 mEq/L (ref 135–145)

## 2016-02-13 ENCOUNTER — Ambulatory Visit (INDEPENDENT_AMBULATORY_CARE_PROVIDER_SITE_OTHER): Payer: Medicare Other | Admitting: Behavioral Health

## 2016-02-13 DIAGNOSIS — M810 Age-related osteoporosis without current pathological fracture: Secondary | ICD-10-CM

## 2016-02-13 MED ORDER — DENOSUMAB 60 MG/ML ~~LOC~~ SOLN
60.0000 mg | Freq: Once | SUBCUTANEOUS | Status: AC
  Administered 2016-02-13: 60 mg via SUBCUTANEOUS

## 2016-02-13 NOTE — Progress Notes (Signed)
Pre visit review using our clinic review tool, if applicable. No additional management support is needed unless otherwise documented below in the visit note.  Patient came in office for Prolia injection. SQ given in Left Arm. Patient tolerated injection well.

## 2016-03-25 ENCOUNTER — Encounter: Payer: Self-pay | Admitting: Podiatry

## 2016-03-25 ENCOUNTER — Ambulatory Visit (INDEPENDENT_AMBULATORY_CARE_PROVIDER_SITE_OTHER): Payer: Medicare Other | Admitting: Podiatry

## 2016-03-25 DIAGNOSIS — M79671 Pain in right foot: Secondary | ICD-10-CM | POA: Diagnosis not present

## 2016-03-25 DIAGNOSIS — B351 Tinea unguium: Secondary | ICD-10-CM | POA: Diagnosis not present

## 2016-03-25 DIAGNOSIS — L84 Corns and callosities: Secondary | ICD-10-CM

## 2016-03-25 DIAGNOSIS — M79672 Pain in left foot: Secondary | ICD-10-CM | POA: Diagnosis not present

## 2016-03-25 NOTE — Progress Notes (Signed)
Subjective:     Patient ID: [name] [name], female   DOB: 06/26/1933, 82 y.o.   MRN: [id number]  HPI This patients presents to office for preventive foot care services.  She develops callus on the ball of both feet which are very painful when walking.  She has also developed long nails since her last visit.  She presents for preventive foot care services. She complains of painful corn on the tip of third toe right foot.  Review of Systems     Objective:   Physical Exam GENERAL APPEARANCE: Alert, conversant. Appropriately groomed. No acute distress.  VASCULAR: Pedal pulses palpable at 2/4 DP and PT bilateral.  Capillary refill time is immediate to all digits,  Proximal to distal cooling it warm to warm.  Digital hair growth is present bilateral  NEUROLOGIC: sensation is intact epicritically and protectively to 5.07 monofilament at 5/5 sites bilateral.  Light touch is intact bilateral, vibratory sensation intact bilateral, achilles tendon reflex is intact bilateral.  MUSCULOSKELETAL: acceptable muscle strength, tone and stability bilateral.  Intrinsic muscluature intact bilateral.  Rectus appearance of foot and digits noted bilateral.   DERMATOLOGIC: skin color, texture, and turgor are within normal limits.  No preulcerative lesions or ulcers  are seen, no interdigital maceration noted.  No open lesions present.  . No drainage noted. Callus sub5th metatarsal both feet. Callus left forefoot. Clavi third toe right foot.      Assessment:     Callus  B/L.  Onychomycosis     Plan:     Debride Nails.  Debride corns and calluses   Gardiner Barefoot DPM

## 2016-04-04 DIAGNOSIS — J040 Acute laryngitis: Secondary | ICD-10-CM | POA: Diagnosis not present

## 2016-04-14 ENCOUNTER — Ambulatory Visit: Payer: Medicare Other | Admitting: Podiatry

## 2016-04-29 DIAGNOSIS — F319 Bipolar disorder, unspecified: Secondary | ICD-10-CM | POA: Diagnosis not present

## 2016-05-03 ENCOUNTER — Other Ambulatory Visit: Payer: Self-pay | Admitting: Family Medicine

## 2016-05-03 NOTE — Telephone Encounter (Signed)
Rx sent to the pharmacy by e-script.  However only given only given #90.  The patient needs further evaluation and/or laboratory testing before further refills are given.  Ask patient to make an appointment for this.//AB/CMA

## 2016-05-27 ENCOUNTER — Ambulatory Visit (INDEPENDENT_AMBULATORY_CARE_PROVIDER_SITE_OTHER): Payer: Medicare Other | Admitting: Podiatry

## 2016-05-27 ENCOUNTER — Encounter: Payer: Self-pay | Admitting: Podiatry

## 2016-05-27 DIAGNOSIS — Q828 Other specified congenital malformations of skin: Secondary | ICD-10-CM | POA: Diagnosis not present

## 2016-05-27 DIAGNOSIS — M79672 Pain in left foot: Secondary | ICD-10-CM

## 2016-05-27 DIAGNOSIS — M79671 Pain in right foot: Secondary | ICD-10-CM

## 2016-05-27 DIAGNOSIS — B351 Tinea unguium: Secondary | ICD-10-CM

## 2016-05-27 NOTE — Progress Notes (Signed)
Subjective:     Patient ID: [name] [name], female   DOB: 30-Jun-1933, 82 y.o.   MRN: [id number]  HPI This patients presents to office for preventive foot care services.  She develops callus on the ball of both feet which are very painful when walking.  She has also developed long nails since her last visit.  She presents for preventive foot care services. She complains of painful corn on the tip of third toe right foot.  Review of Systems     Objective:   Physical Exam GENERAL APPEARANCE: Alert, conversant. Appropriately groomed. No acute distress.  VASCULAR: Pedal pulses palpable at 2/4 DP and PT bilateral.  Capillary refill time is immediate to all digits,  Proximal to distal cooling it warm to warm.  Digital hair growth is present bilateral  NEUROLOGIC: sensation is intact epicritically and protectively to 5.07 monofilament at 5/5 sites bilateral.  Light touch is intact bilateral, vibratory sensation intact bilateral, achilles tendon reflex is intact bilateral.  MUSCULOSKELETAL: acceptable muscle strength, tone and stability bilateral.  Intrinsic muscluature intact bilateral.  Rectus appearance of foot and digits noted bilateral.   DERMATOLOGIC: skin color, texture, and turgor are within normal limits.  No preulcerative lesions or ulcers  are seen, no interdigital maceration noted.  No open lesions present.  . No drainage noted. Callus sub5th metatarsal both feet. Callus left forefoot. Clavi third toe right foot.      Assessment:     Callus  B/L.  Onychomycosis     Plan:     Debride Nails.  Debride corns and calluses   Gardiner Barefoot DPM

## 2016-05-31 ENCOUNTER — Ambulatory Visit (INDEPENDENT_AMBULATORY_CARE_PROVIDER_SITE_OTHER): Payer: Medicare Other | Admitting: Family

## 2016-05-31 ENCOUNTER — Encounter: Payer: Self-pay | Admitting: Family

## 2016-05-31 VITALS — BP 138/80 | HR 84 | Temp 97.7°F | Resp 18 | Wt 161.4 lb

## 2016-05-31 DIAGNOSIS — N3001 Acute cystitis with hematuria: Secondary | ICD-10-CM | POA: Diagnosis not present

## 2016-05-31 DIAGNOSIS — N3000 Acute cystitis without hematuria: Secondary | ICD-10-CM

## 2016-05-31 DIAGNOSIS — N390 Urinary tract infection, site not specified: Secondary | ICD-10-CM

## 2016-05-31 DIAGNOSIS — R531 Weakness: Secondary | ICD-10-CM

## 2016-05-31 LAB — POCT URINALYSIS DIPSTICK
Bilirubin, UA: NEGATIVE
Glucose, UA: NEGATIVE
Ketones, UA: NEGATIVE
Nitrite, UA: POSITIVE
Spec Grav, UA: 1.015
Urobilinogen, UA: NEGATIVE
pH, UA: 7.5

## 2016-05-31 LAB — COMPREHENSIVE METABOLIC PANEL
ALT: 19 U/L (ref 0–35)
AST: 20 U/L (ref 0–37)
Albumin: 4.4 g/dL (ref 3.5–5.2)
Alkaline Phosphatase: 27 U/L — ABNORMAL LOW (ref 39–117)
BUN: 14 mg/dL (ref 6–23)
CO2: 28 mEq/L (ref 19–32)
Calcium: 10.2 mg/dL (ref 8.4–10.5)
Chloride: 101 mEq/L (ref 96–112)
Creatinine, Ser: 0.96 mg/dL (ref 0.40–1.20)
GFR: 59.3 mL/min — ABNORMAL LOW (ref >60.00)
Glucose, Bld: 115 mg/dL — ABNORMAL HIGH (ref 70–99)
Potassium: 3.8 mEq/L (ref 3.5–5.1)
Sodium: 139 mEq/L (ref 135–145)
Total Bilirubin: 0.4 mg/dL (ref 0.2–1.2)
Total Protein: 7.8 g/dL (ref 6.0–8.3)

## 2016-05-31 LAB — CBC WITH DIFFERENTIAL/PLATELET
Basophils Absolute: 0 10*3/uL (ref 0.0–0.1)
Basophils Relative: 0.3 % (ref 0.0–3.0)
Eosinophils Absolute: 0 10*3/uL (ref 0.0–0.7)
Eosinophils Relative: 0.3 % (ref 0.0–5.0)
HCT: 42.3 % (ref 36.0–46.0)
Hemoglobin: 14.2 g/dL (ref 12.0–15.0)
Lymphocytes Relative: 21.9 % (ref 12.0–46.0)
Lymphs Abs: 2.2 10*3/uL (ref 0.7–4.0)
MCHC: 33.5 g/dL (ref 30.0–36.0)
MCV: 91.4 fl (ref 78.0–100.0)
Monocytes Absolute: 0.8 10*3/uL (ref 0.1–1.0)
Monocytes Relative: 8.4 % (ref 3.0–12.0)
Neutro Abs: 6.9 10*3/uL (ref 1.4–7.7)
Neutrophils Relative %: 69.1 % (ref 43.0–77.0)
Platelets: 274 10*3/uL (ref 150.0–400.0)
RBC: 4.63 Mil/uL (ref 3.87–5.11)
RDW: 14.2 % (ref 11.5–15.5)
WBC: 10 10*3/uL (ref 4.0–10.5)

## 2016-05-31 LAB — TSH: TSH: 3.79 u[IU]/mL (ref 0.35–4.50)

## 2016-05-31 MED ORDER — CIPROFLOXACIN HCL 250 MG PO TABS: 250.0000 mg | ORAL_TABLET | Freq: Two times a day (BID) | ORAL | 0 refills | Status: DC

## 2016-05-31 NOTE — Progress Notes (Signed)
Subjective:    Patient ID: [name] [name], female    DOB: 12-02-1932, 82 y.o.   MRN: [id number]  HPI  [name] [name] is a 82 yr old female who presents today with complaint of generalized weakness x 3 weeks. Reports weakness is worsening. Reports left sided abdominal pain x 2 weeks.  Pain in abdomen is worse after eating.   BP Readings from Last 3 Encounters:  05/31/16 (!) 162/100  07/31/15 132/87  05/14/15 (!) 148/81     Review of Systems  Constitutional: Positive for fatigue. Negative for fever.  HENT: Negative for rhinorrhea.   Respiratory: Negative for cough.   Gastrointestinal: Positive for abdominal pain. Negative for blood in stool, constipation, diarrhea and vomiting.  Genitourinary: Positive for urgency. Negative for dysuria, frequency and hematuria.  Musculoskeletal:       + left knee pain  Neurological:       Reports + recent HA's. None today.    Psychiatric/Behavioral:       Reports good mood.     Past Medical History:  Diagnosis Date  . Anemia    h/o fibroids  . Bipolar disorder ([place])   . Depression    bipolar  . Eczema   . Hypothyroidism   . Insomnia   . Osteoarthritis   . Osteoporosis   . Psoriasis      Social History   Social History  . Marital status: Single    Spouse name: N/A  . Number of children: 1  . Years of education: N/A   Occupational History  . retired Retired   Social History Main Topics  . Smoking status: Former Smoker    Quit date: 09/29/1985  . Smokeless tobacco: Never Used  . Alcohol use No  . Drug use: No  . Sexual activity: Not Currently    Partners: Male   Other Topics Concern  . Not on file   Social History Narrative   Lives by herself   PT for knee----silver sneakers    Past Surgical History:  Procedure Laterality Date  . ABDOMINAL HYSTERECTOMY  1982  . APPENDECTOMY  2003  . BREAST LUMPECTOMY  1947   left  . CATARACT EXTRACTION  2010  . EYE SURGERY    . HERNIA REPAIR    . KNEE ARTHROSCOPY  09/07/11   right knee  . NASAL SEPTUM SURGERY  1970's  . TONSILLECTOMY  1970's  . TONSILLECTOMY    . TOTAL KNEE ARTHROPLASTY Right 03/04/2015   Procedure: RIGHT TOTAL KNEE ARTHROPLASTY;  Surgeon: Mcarthur Rossetti, MD;  Location: [facility];  Service: Orthopedics;  Laterality: Right;  . VENTRAL HERNIA REPAIR  2005    Family History  Problem Relation Age of Onset  . Colon cancer Brother   . Lung cancer Father   . Prostate cancer Father   . Arthritis Father   . Cancer Father     lung, prostate  . Heart disease Brother   . Cancer Brother     lung  . Lung cancer Brother   . Aortic aneurysm Brother   . Cancer Other     colon    Allergies  Allergen Reactions  . Bupropion Hcl Rash    Current Outpatient Prescriptions on File Prior to Visit  Medication Sig Dispense Refill  . ammonium lactate (LAC-HYDRIN) 12 % cream Apply topically as needed for dry skin. 385 g 0  . ARIPiprazole (ABILIFY) 5 MG tablet Take 1 tablet by mouth daily.    Marland Kitchen b complex vitamins  tablet Take 2 tablets by mouth daily.    . calcium carbonate (OS-CAL) 600 MG TABS Take 600 mg by mouth 2 (two) times daily with a meal.      . cholecalciferol (VITAMIN D) 1000 UNITS tablet Take 2,000 Units by mouth daily.     . Cyanocobalamin (B-12) 2500 MCG TABS Take 1 tablet by mouth daily.    Marland Kitchen docusate sodium (COLACE) 100 MG capsule Take 1 capsule (100 mg total) by mouth 2 (two) times daily. 10 capsule 0  . fish oil-omega-3 fatty acids 1000 MG capsule Take 1 g by mouth daily.      . Glucosamine 500 MG TABS Take 1,000 mg by mouth daily.     . hypromellose (GENTEAL) 0.3 % GEL ophthalmic ointment Place 1 application into both eyes at bedtime.    Marland Kitchen levothyroxine (SYNTHROID, LEVOTHROID) 75 MCG tablet TAKE 1 TABLET BY MOUTH DAILY BEFORE BREAKFAST. 90 tablet 0  . Lutein 40 MG CAPS Take 40 mg by mouth daily.     . methocarbamol (ROBAXIN) 500 MG tablet Take 1 tablet (500 mg total) by mouth every 6 (six) hours as needed for muscle spasms. 40 tablet 1   . multivitamin (THERAGRAN) per tablet Take 1 tablet by mouth daily.      . rivaroxaban (XARELTO) 10 MG TABS tablet Take 1 tablet (10 mg total) by mouth daily with breakfast. 20 tablet 0  . zolpidem (AMBIEN) 10 MG tablet Take 10 mg by mouth at bedtime.      No current facility-administered medications on file prior to visit.     BP (!) 162/100 (BP Location: Right Arm, Cuff Size: Normal)   Pulse 84   Temp 97.7 F (36.5 C) (Oral)   Resp 18   Wt 161 lb 6.4 oz (73.2 kg)   SpO2 97% Comment: room air  BMI 26.05 kg/m       Objective:   Physical Exam  Constitutional: She is oriented to person, place, and time. She appears well-developed and well-nourished.  HENT:  Head: Normocephalic and atraumatic.  Cardiovascular: Normal rate, regular rhythm and normal heart sounds.   No murmur heard. Pulmonary/Chest: Effort normal and breath sounds normal. No respiratory distress. She has no wheezes.  Abdominal: Soft. Bowel sounds are normal. She exhibits no distension and no mass. There is tenderness in the left lower quadrant. There is no rebound and no guarding.  Genitourinary:  Genitourinary Comments: Neg CVAT bilaterally  Lymphadenopathy:    She has no cervical adenopathy.  Neurological: She is alert and oriented to person, place, and time.  Skin: Skin is warm and dry.  Psychiatric: She has a normal mood and affect. Her behavior is normal. Judgment and thought content normal.          Assessment & Plan:  UTI- UA suggestive of UTI.  Will initiate cipro and send urine culture. Will plan rx for 7 days to cover for pyelo. Will obtain CMET, CBC as well to assess renal function and white count. If abdominal pain worsens or does not improve will need CT to further assess left kidney and exclude diverticulitis.

## 2016-05-31 NOTE — Progress Notes (Signed)
Pre visit review using our clinic review tool, if applicable. No additional management support is needed unless otherwise documented below in the visit note. 

## 2016-05-31 NOTE — Patient Instructions (Signed)
Please begin Cipro for urinary tract infection. Call if new/worsening abdominal pain or if weakness is not better in 2-3 days.

## 2016-06-02 LAB — URINE CULTURE: Colony Count: >=100000

## 2016-06-09 ENCOUNTER — Telehealth: Payer: Self-pay | Admitting: Family Medicine

## 2016-06-09 NOTE — Telephone Encounter (Signed)
Noted  

## 2016-06-09 NOTE — Telephone Encounter (Signed)
Pt called in to make provider Inda Castle aware that she is feeling better after completing the Rx she provided.

## 2016-06-15 ENCOUNTER — Other Ambulatory Visit: Payer: Self-pay

## 2016-07-30 ENCOUNTER — Other Ambulatory Visit: Payer: Self-pay | Admitting: Family Medicine

## 2016-08-05 ENCOUNTER — Ambulatory Visit (INDEPENDENT_AMBULATORY_CARE_PROVIDER_SITE_OTHER): Payer: Medicare Other | Admitting: Physician Assistant

## 2016-08-05 DIAGNOSIS — Z96651 Presence of right artificial knee joint: Secondary | ICD-10-CM | POA: Diagnosis not present

## 2016-08-05 DIAGNOSIS — M25561 Pain in right knee: Secondary | ICD-10-CM

## 2016-08-05 DIAGNOSIS — M7061 Trochanteric bursitis, right hip: Secondary | ICD-10-CM

## 2016-08-07 ENCOUNTER — Other Ambulatory Visit: Payer: Self-pay | Admitting: Family Medicine

## 2016-08-11 ENCOUNTER — Encounter: Payer: Self-pay | Admitting: Physical Therapy

## 2016-08-11 ENCOUNTER — Ambulatory Visit: Payer: Medicare Other | Attending: Physician Assistant | Admitting: Physical Therapy

## 2016-08-11 DIAGNOSIS — M79604 Pain in right leg: Secondary | ICD-10-CM | POA: Diagnosis not present

## 2016-08-11 DIAGNOSIS — M7631 Iliotibial band syndrome, right leg: Secondary | ICD-10-CM | POA: Diagnosis not present

## 2016-08-11 NOTE — Therapy (Signed)
[address] [place], [place], [address] Phone: [phone]   Fax:  [phone]  Physical Therapy Evaluation  Patient Details  Name: [name] [name] MRN: [id number] Date of Birth: December 31, 1932 Referring Provider: Erskine Emery  Encounter Date: 08/11/2016      PT End of Session - 08/11/16 1704    Visit Number 1   Date for PT Re-Evaluation 10/11/16   PT Start Time [id number]   PT Stop Time 1540   PT Time Calculation (min) 59 min   Activity Tolerance Patient tolerated treatment well;Patient limited by pain   Behavior During Therapy [facility] for tasks assessed/performed      Past Medical History:  Diagnosis Date  . Anemia    h/o fibroids  . Bipolar disorder ([place])   . Depression    bipolar  . Eczema   . Hypothyroidism   . Insomnia   . Osteoarthritis   . Osteoporosis   . Psoriasis     Past Surgical History:  Procedure Laterality Date  . ABDOMINAL HYSTERECTOMY  1982  . APPENDECTOMY  2003  . BREAST LUMPECTOMY  1947   left  . CATARACT EXTRACTION  2010  . EYE SURGERY    . HERNIA REPAIR    . KNEE ARTHROSCOPY  09/07/11   right knee  . NASAL SEPTUM SURGERY  1970's  . TONSILLECTOMY  1970's  . TONSILLECTOMY    . TOTAL KNEE ARTHROPLASTY Right 03/04/2015   Procedure: RIGHT TOTAL KNEE ARTHROPLASTY;  Surgeon: Mcarthur Rossetti, MD;  Location: [place];  Service: Orthopedics;  Laterality: Right;  . VENTRAL HERNIA REPAIR  2005    There were no vitals filed for this visit.       Subjective Assessment - 08/11/16 1445    Subjective Patient with a diagnosis of right hip trochanteric bursitis and quad weakness.  She c/o right lateral thigh and hip pain, "like a cramping", worse in the morning.  No known cause.  She had the right knee replaced May 2016.  She reports that she likes to walk for exercise, she does silver sneakers twice a weak.   Limitations Standing   Patient Stated Goals have less pain   Currently in  Pain? Yes   Pain Score 3    Pain Location Leg   Pain Orientation Right;Upper;Lateral   Pain Descriptors / Indicators Cramping   Pain Type Acute pain   Pain Onset More than a month ago   Pain Frequency Intermittent   Aggravating Factors  first thing in the AM pain is a 6/10   Pain Relieving Factors getting up and moving her pain is better   Effect of Pain on Daily Activities jsut difficult in the AM,             [facility] PT Assessment - 08/11/16 0001      Assessment   Medical Diagnosis right trochanteric bursitis and leg weakness   Referring Provider Erskine Emery   Onset Date/Surgical Date 07/12/16   Prior Therapy no     Precautions   Precautions None     Balance Screen   Has the patient fallen in the past 6 months No   Has the patient had a decrease in activity level because of a fear of falling?  No   Is the patient reluctant to leave their home because of a fear of falling?  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, does silver sneakers twice a week     Posture/Postural Control   Posture Comments fwd head, rounded shoulders, decreased lordosis     ROM / Strength   AROM / PROM / Strength AROM;Strength     AROM   Overall AROM Comments WFL's, tightness on 90/90 knee extension right was 145 degrees,      Strength   Overall Strength Comments knees 4/5, hips flexion 4/5, ankles 4/5, hip abduction 4/5     Flexibility   Soft Tissue Assessment /Muscle Length yes  tight HS, very tight quads and ITB's   Hamstrings very tight HS, with 90/90 test knee extension was to 35 degrees from full extnesion   Quadriceps very tight quads   ITB very tight ITBs with pain   Piriformis very tight piriformis with c/o pain     Palpation   Palpation comment tightness in the quads, tender ITB and greater  trochanter     Ambulation/Gait   Gait Comments slight stooped posture, forward flexion of the trunk, she does not use a device, shuffling steps                   [facility] Adult PT Treatment/Exercise - 08/11/16 0001      Manual Therapy   Manual Therapy Passive ROM   Manual therapy comments passive stretches of the LE's                PT Education - 08/11/16 1744    Education provided Yes   Education Details gastroc and HS stretches   Person(s) Educated Patient   Methods Explanation;Demonstration;Handout   Comprehension Verbalized understanding          PT Short Term Goals - 08/11/16 1717      PT SHORT TERM GOAL #1   Title Pt will demonstrate proper recall of HEP    Time 4   Period Weeks   Status New           PT Long Term Goals - 08/11/16 1717      PT LONG TERM GOAL #1   Title Pt will be able to tolerate HS stretch past 45 degrees hip flexion with knee extended.   Time 8   Period Weeks   Status New     PT LONG TERM GOAL #2   Title Pt will report 0/10 pain and no symptoms in the lateral portion of the leg.   Time 8   Period Weeks   Status New     PT LONG TERM GOAL #3   Title Pt will be able to participate in all normal exercise activities without increase in symptoms.   Time 8   Period Weeks   Status New               Plan - 08/11/16 1612    Clinical Impression Statement Patient presents with very limited flexibility and pain as a result of tight lower extremities. Patient is hihgly active for age howveer she reports that she never stretches. Patient lacks full knee extension during 90/90 measurement. Patient unable to tolerate pasive hamstring streching past 45 degrees hip flexion with knee extended. and reports quad "cramp" on left side. Patient reports increase in symptoms with IT band stretch and is unable to tolerate for very long.   Rehab Potential Fair   Clinical  Impairments Affecting Rehab Potential Age, pain   PT Frequency  2x / week   PT Duration 8 weeks   PT Treatment/Interventions Moist Heat;Cryotherapy;Electrical Stimulation;Ultrasound;Manual techniques;Therapeutic exercise;Therapeutic activities;Functional mobility training;Passive range of motion      Patient will benefit from skilled therapeutic intervention in order to improve the following deficits and impairments:  Decreased range of motion, Improper body mechanics, Pain, Increased muscle spasms  Visit Diagnosis: Pain in right leg - Plan: PT plan of care cert/re-cert  It band syndrome, right - Plan: PT plan of care cert/re-cert      G-Codes - [id number]    Functional Assessment Tool Used foto 63% limitation   Functional Limitation Mobility: Walking and moving around   Mobility: Walking and Moving Around Current Status [phone]) At least 60 percent but less than 80 percent impaired, limited or restricted   Mobility: Walking and Moving Around Goal Status [phone]) At least 40 percent but less than 60 percent impaired, limited or restricted       Problem List Patient Active Problem List   Diagnosis Date Noted  . Acute blood loss anemia 03/16/2015  . Osteoarthritis of right knee 03/04/2015  . Status post total right knee replacement 03/04/2015  . Visit for suture removal 10/23/2014  . Contusion of knee, right 10/23/2014  . IBS 02/23/2010  . HEMORRHOIDS, EXTERNAL 01/01/2010  . HEMATURIA UNSPECIFIED 11/04/2009  . NAUSEA 06/02/2009  . URINARY INCONTINENCE, STRESS, FEMALE 05/09/2009  . SINUSITIS- ACUTE-NOS 11/25/2008  . UTI 04/23/2008  . COLONIC POLYPS 12/21/2007  . Bipolar disorder ([place]) 12/21/2007  . DEVIATED NASAL SEPTUM 12/21/2007  . HERNIA, VENTRAL 12/21/2007  . OSTEOARTHRITIS 12/21/2007  . Hypothyroidism 09/01/2007  . ECZEMA 09/01/2007  . Psoriasis 09/01/2007  . Osteoporosis 09/01/2007  . Insomnia 09/01/2007    Sumner Boast., PT 08/11/2016, 5:48 PM  [place] [address] [place], [place], [address] Phone: [phone]   Fax:  [phone]  Name: [name] [name] MRN: [id number] Date of Birth: [birth year]

## 2016-08-13 ENCOUNTER — Encounter: Payer: Self-pay | Admitting: Physical Therapy

## 2016-08-13 ENCOUNTER — Ambulatory Visit: Payer: Medicare Other | Admitting: Physical Therapy

## 2016-08-13 DIAGNOSIS — M7631 Iliotibial band syndrome, right leg: Secondary | ICD-10-CM | POA: Diagnosis not present

## 2016-08-13 DIAGNOSIS — M79604 Pain in right leg: Secondary | ICD-10-CM | POA: Diagnosis not present

## 2016-08-13 NOTE — Therapy (Signed)
[place] [address] [place], [place], [address] Phone: [phone]   Fax:  [phone]  Physical Therapy Treatment  Patient Details  Name: [name] [name] MRN: [id number] Date of Birth: Apr 21, 1933 Referring Provider: Erskine Emery  Encounter Date: 08/13/2016      PT End of Session - 08/13/16 1138    Visit Number 2   Date for PT Re-Evaluation 10/11/16   PT Start Time 1100   PT Stop Time 1142   PT Time Calculation (min) 42 min   Activity Tolerance Patient tolerated treatment well;Patient limited by pain   Behavior During Therapy [facility] for tasks assessed/performed      Past Medical History:  Diagnosis Date  . Anemia    h/o fibroids  . Bipolar disorder ([place])   . Depression    bipolar  . Eczema   . Hypothyroidism   . Insomnia   . Osteoarthritis   . Osteoporosis   . Psoriasis     Past Surgical History:  Procedure Laterality Date  . ABDOMINAL HYSTERECTOMY  1982  . APPENDECTOMY  2003  . BREAST LUMPECTOMY  1947   left  . CATARACT EXTRACTION  2010  . EYE SURGERY    . HERNIA REPAIR    . KNEE ARTHROSCOPY  09/07/11   right knee  . NASAL SEPTUM SURGERY  1970's  . TONSILLECTOMY  1970's  . TONSILLECTOMY    . TOTAL KNEE ARTHROPLASTY Right 03/04/2015   Procedure: RIGHT TOTAL KNEE ARTHROPLASTY;  Surgeon: Mcarthur Rossetti, MD;  Location: [place];  Service: Orthopedics;  Laterality: Right;  . VENTRAL HERNIA REPAIR  2005    There were no vitals filed for this visit.      Subjective Assessment - 08/13/16 1120    Subjective Patient reports 4/10 pain today. She states she has been doing her home exercise stretches however she thinks she may be doing the gastroc stretch incorrectly.   Limitations Standing   Patient Stated Goals have less pain   Currently in Pain? Yes   Pain Score 4    Pain Location Leg   Pain Orientation Lateral;Right   Pain Descriptors / Indicators Cramping;Aching   Pain Type Acute pain    Pain Onset More than a month ago                         [facility] Adult PT Treatment/Exercise - 08/13/16 0001      Exercises   Exercises Knee/Hip     Knee/Hip Exercises: Stretches   Gastroc Stretch 2 reps;20 seconds   Gastroc Stretch Limitations Pt did not comprehend hep gastroc stretch     Knee/Hip Exercises: Aerobic   Stationary Bike NuStep, lvl 4, 5 minutes     Knee/Hip Exercises: Standing   Hip ADduction Strengthening;AROM;Both;3 sets;10 reps  orange ball, squeexe and hold 5 seconds   Hip Abduction Stengthening;Right;AROM;2 sets;10 reps  Standing     Knee/Hip Exercises: Seated   Long Arc Quad 1 set;20 reps;Strengthening;Right  3 lb   Long Arc Quad Weight 3 lbs.     Modalities   Modalities Moist Heat  right IT band, sidelying     Manual Therapy   Manual Therapy Passive ROM   Manual therapy comments stretch gastroc, HS, piriformis, low back(SKC)                PT Education - 08/13/16 1137    Education provided Yes   Education Details gastroc  stretch home exercise   Person(s) Educated Patient   Methods Explanation;Demonstration;Tactile cues;Verbal cues   Comprehension Verbalized understanding          PT Short Term Goals - 08/11/16 1717      PT SHORT TERM GOAL #1   Title Pt will demonstrate proper recall of HEP    Time 4   Period Weeks   Status New           PT Long Term Goals - 08/11/16 1717      PT LONG TERM GOAL #1   Title Pt will be able to tolerate HS stretch past 45 degrees hip flexion with knee extended.   Time 8   Period Weeks   Status New     PT LONG TERM GOAL #2   Title Pt will report 0/10 pain and no symptoms in the lateral portion of the leg.   Time 8   Period Weeks   Status New     PT LONG TERM GOAL #3   Title Pt will be able to participate in all normal exercise activities without increase in symptoms.   Time 8   Period Weeks   Status New               Plan - 08/13/16 1145    Clinical  Impression Statement Pateint tolerated treatment well and was able to complete all exercises. Patient did report fatigue with standing hip abduction exercise however she stated that the exercise "felt good". Patrient reports a decrease in pain by 2 marks by the end of treatmetn and after moist heat was applied to the lateral portion of her right leg. Continue to progress per pt tolerance.   Rehab Potential Good   Clinical Impairments Affecting Rehab Potential Age, pain   PT Frequency 2x / week   PT Duration 8 weeks   PT Treatment/Interventions Moist Heat;Cryotherapy;Electrical Stimulation;Ultrasound;Manual techniques;Therapeutic exercise;Therapeutic activities;Functional mobility training;Passive range of motion   PT Next Visit Plan Supine/Standing hip strengthening exercises, pain management, aeroic exercise, manual stretching   PT Home Exercise Plan gastroc, HS stretch   Consulted and Agree with Plan of Care Patient      Patient will benefit from skilled therapeutic intervention in order to improve the following deficits and impairments:  Decreased range of motion, Improper body mechanics, Pain, Increased muscle spasms  Visit Diagnosis: Pain in right leg  It band syndrome, right     Problem List Patient Active Problem List   Diagnosis Date Noted  . Acute blood loss anemia 03/16/2015  . Osteoarthritis of right knee 03/04/2015  . Status post total right knee replacement 03/04/2015  . Visit for suture removal 10/23/2014  . Contusion of knee, right 10/23/2014  . IBS 02/23/2010  . HEMORRHOIDS, EXTERNAL 01/01/2010  . HEMATURIA UNSPECIFIED 11/04/2009  . NAUSEA 06/02/2009  . URINARY INCONTINENCE, STRESS, FEMALE 05/09/2009  . SINUSITIS- ACUTE-NOS 11/25/2008  . UTI 04/23/2008  . COLONIC POLYPS 12/21/2007  . Bipolar disorder ([place]) 12/21/2007  . DEVIATED NASAL SEPTUM 12/21/2007  . HERNIA, VENTRAL 12/21/2007  . OSTEOARTHRITIS 12/21/2007  . Hypothyroidism 09/01/2007  . ECZEMA  09/01/2007  . Psoriasis 09/01/2007  . Osteoporosis 09/01/2007  . Insomnia 09/01/2007    Toy Baker, SPT 08/13/2016, 11:50 AM  [place] [address] [place], [place], [address] Phone: [phone]   Fax:  [phone]  Name: [name] [name] MRN: [id number] Date of Birth: 06-10-1933

## 2016-08-18 ENCOUNTER — Encounter: Payer: Self-pay | Admitting: Physical Therapy

## 2016-08-18 ENCOUNTER — Ambulatory Visit: Payer: Medicare Other | Attending: Orthopaedic Surgery | Admitting: Physical Therapy

## 2016-08-18 DIAGNOSIS — M79604 Pain in right leg: Secondary | ICD-10-CM | POA: Insufficient documentation

## 2016-08-18 DIAGNOSIS — M7631 Iliotibial band syndrome, right leg: Secondary | ICD-10-CM | POA: Insufficient documentation

## 2016-08-18 NOTE — Therapy (Signed)
[place] [address] [place], [place], [address] Phone: [phone]   Fax:  [phone]  Physical Therapy Treatment  Patient Details  Name: [name] [name] MRN: [id number] Date of Birth: 1933/08/28 Referring Provider: Erskine Emery  Encounter Date: 08/18/2016      PT End of Session - 08/18/16 1502    Visit Number 3   Date for PT Re-Evaluation 10/11/16   PT Start Time 0225   PT Stop Time 0310   PT Time Calculation (min) 45 min   Activity Tolerance Patient tolerated treatment well;Patient limited by pain;Patient limited by fatigue   Behavior During Therapy [facility] for tasks assessed/performed      Past Medical History:  Diagnosis Date  . Anemia    h/o fibroids  . Bipolar disorder ([place])   . Depression    bipolar  . Eczema   . Hypothyroidism   . Insomnia   . Osteoarthritis   . Osteoporosis   . Psoriasis     Past Surgical History:  Procedure Laterality Date  . ABDOMINAL HYSTERECTOMY  1982  . APPENDECTOMY  2003  . BREAST LUMPECTOMY  1947   left  . CATARACT EXTRACTION  2010  . EYE SURGERY    . HERNIA REPAIR    . KNEE ARTHROSCOPY  09/07/11   right knee  . NASAL SEPTUM SURGERY  1970's  . TONSILLECTOMY  1970's  . TONSILLECTOMY    . TOTAL KNEE ARTHROPLASTY Right 03/04/2015   Procedure: RIGHT TOTAL KNEE ARTHROPLASTY;  Surgeon: Mcarthur Rossetti, MD;  Location: [place];  Service: Orthopedics;  Laterality: Right;  . VENTRAL HERNIA REPAIR  2005    There were no vitals filed for this visit.      Subjective Assessment - 08/18/16 1428    Subjective  Patient reports 2/10 pain today. She states that the pain was worse this morning however the pain has gotten better as the day progresses. Patient reports she has been doing her stretches at home.   Limitations Standing;Walking   Patient Stated Goals have less pain   Currently in Pain? Yes   Pain Score 2    Pain Location Leg   Pain Orientation Right;Lateral    Pain Descriptors / Indicators Cramping   Pain Type Acute pain   Pain Onset More than a month ago                         [facility] Adult PT Treatment/Exercise - 08/18/16 0001      Knee/Hip Exercises: Aerobic   Stationary Bike NuStep lvl 4, 6 minutes     Knee/Hip Exercises: Standing   Hip Flexion Stengthening;Both;1 set;20 reps  Standing marches, stand by assist   Hip Abduction Stengthening;Both;1 set;20 reps   Lateral Step Up Both;1 set  down and back. Pt fatigued, needed min. assist     Knee/Hip Exercises: Seated   Long Arc Quad 20 reps;2 sets   [employer] Weight 3 lbs.   Clamshell with TheraBand Blue  SUPINE, 20 reps   Marching Weights 3 lbs.     Knee/Hip Exercises: Supine   Other Supine Knee/Hip Exercises ball squeezes, 1 set, 20 reps, 3 sec hold     Modalities   Modalities Moist Heat     Moist Heat Therapy   Number Minutes Moist Heat 10 Minutes   Moist Heat Location Hip  lateral      Manual Therapy   Manual Therapy Passive  ROM   Manual therapy comments gastroc stretch, HS stretch, piriformis stretch, SKC stretch.                PT Education - 08/18/16 1502    Education provided No          PT Short Term Goals - 08/11/16 1717      PT SHORT TERM GOAL #1   Title Pt will demonstrate proper recall of HEP    Time 4   Period Weeks   Status New           PT Long Term Goals - 08/11/16 1717      PT LONG TERM GOAL #1   Title Pt will be able to tolerate HS stretch past 45 degrees hip flexion with knee extended.   Time 8   Period Weeks   Status New     PT LONG TERM GOAL #2   Title Pt will report 0/10 pain and no symptoms in the lateral portion of the leg.   Time 8   Period Weeks   Status New     PT LONG TERM GOAL #3   Title Pt will be able to participate in all normal exercise activities without increase in symptoms.   Time 8   Period Weeks   Status New               Plan - 08/18/16 1504    Clinical  Impression Statement Patient toleated treatment fair. Due to fatifue and decreased endurance, patient needed extended rest breaks. She was able to complete full sets however she voiced being "tired" and needing to "breathe" several times. Patient not able to perform latersal step over exercise without min. assistance. Patient reports that the moist heat modality feels "the best".   Rehab Potential Good   Clinical Impairments Affecting Rehab Potential Age, pain   PT Frequency 2x / week   PT Duration 8 weeks   PT Treatment/Interventions Moist Heat;Cryotherapy;Electrical Stimulation;Ultrasound;Manual techniques;Therapeutic exercise;Therapeutic activities;Functional mobility training;Passive range of motion   PT Next Visit Plan Supine/Standing hip strengthening exercises, pain management, aeroic exercise, manual stretching   PT Home Exercise Plan gastroc, HS stretch   Consulted and Agree with Plan of Care Patient      Patient will benefit from skilled therapeutic intervention in order to improve the following deficits and impairments:  Decreased range of motion, Improper body mechanics, Pain, Increased muscle spasms, Decreased balance, Decreased mobility, Decreased endurance, Decreased coordination, Decreased activity tolerance  Visit Diagnosis: Pain in right leg  It band syndrome, right     Problem List Patient Active Problem List   Diagnosis Date Noted  . Acute blood loss anemia 03/16/2015  . Osteoarthritis of right knee 03/04/2015  . Status post total right knee replacement 03/04/2015  . Visit for suture removal 10/23/2014  . Contusion of knee, right 10/23/2014  . IBS 02/23/2010  . HEMORRHOIDS, EXTERNAL 01/01/2010  . HEMATURIA UNSPECIFIED 11/04/2009  . NAUSEA 06/02/2009  . URINARY INCONTINENCE, STRESS, FEMALE 05/09/2009  . SINUSITIS- ACUTE-NOS 11/25/2008  . UTI 04/23/2008  . COLONIC POLYPS 12/21/2007  . Bipolar disorder ([place]) 12/21/2007  . DEVIATED NASAL SEPTUM 12/21/2007  .  HERNIA, VENTRAL 12/21/2007  . OSTEOARTHRITIS 12/21/2007  . Hypothyroidism 09/01/2007  . ECZEMA 09/01/2007  . Psoriasis 09/01/2007  . Osteoporosis 09/01/2007  . Insomnia 09/01/2007    Toy Baker, SPT 08/18/2016, 3:09 PM  [place], [place], [address] Phone:  [phone]   Fax:  [phone]  Name: [name] [name] MRN: [id number] Date of Birth: [birth year]

## 2016-08-19 ENCOUNTER — Telehealth (INDEPENDENT_AMBULATORY_CARE_PROVIDER_SITE_OTHER): Payer: Self-pay

## 2016-08-19 NOTE — Telephone Encounter (Signed)
[name] [name] jones office called wanting to know if patient needed to be pre medicated for dental work. PER Rebecca Eaton assistant she does not need any meds

## 2016-08-20 ENCOUNTER — Encounter: Payer: Self-pay | Admitting: Physical Therapy

## 2016-08-20 ENCOUNTER — Ambulatory Visit: Payer: Medicare Other | Admitting: Physical Therapy

## 2016-08-20 ENCOUNTER — Other Ambulatory Visit: Payer: Self-pay | Admitting: Family Medicine

## 2016-08-20 DIAGNOSIS — M79604 Pain in right leg: Secondary | ICD-10-CM | POA: Diagnosis not present

## 2016-08-20 DIAGNOSIS — M7631 Iliotibial band syndrome, right leg: Secondary | ICD-10-CM | POA: Diagnosis not present

## 2016-08-20 NOTE — Therapy (Signed)
[place], [place], [address] Phone: [phone]   Fax:  [phone]  Physical Therapy Treatment  Patient Details  Name: [name] [name] MRN: [id number] Date of Birth: Mar 16, 1933 Referring Provider: Erskine Emery  Encounter Date: 08/20/2016      PT End of Session - 08/20/16 1126    Visit Number 4   Date for PT Re-Evaluation 10/11/16   PT Start Time 1055   PT Stop Time 1140   PT Time Calculation (min) 45 min   Activity Tolerance Patient tolerated treatment well;No increased pain   Behavior During Therapy WFL for tasks assessed/performed      Past Medical History:  Diagnosis Date  . Anemia    h/o fibroids  . Bipolar disorder ([place])   . Depression    bipolar  . Eczema   . Hypothyroidism   . Insomnia   . Osteoarthritis   . Osteoporosis   . Psoriasis     Past Surgical History:  Procedure Laterality Date  . ABDOMINAL HYSTERECTOMY  1982  . APPENDECTOMY  2003  . BREAST LUMPECTOMY  1947   left  . CATARACT EXTRACTION  2010  . EYE SURGERY    . HERNIA REPAIR    . KNEE ARTHROSCOPY  09/07/11   right knee  . NASAL SEPTUM SURGERY  1970's  . TONSILLECTOMY  1970's  . TONSILLECTOMY    . TOTAL KNEE ARTHROPLASTY Right 03/04/2015   Procedure: RIGHT TOTAL KNEE ARTHROPLASTY;  Surgeon: Mcarthur Rossetti, MD;  Location: [facility];  Service: Orthopedics;  Laterality: Right;  . VENTRAL HERNIA REPAIR  2005    There were no vitals filed for this visit.      Subjective Assessment - 08/20/16 1053    Subjective Patient states she is having no pain this morning however she does report pain when she woke up with pain. She was able to decrease the pain by doing her stretches which she received form her HEP.   Limitations Standing;Walking   Patient Stated Goals have less pain   Pain Score 0-No pain                         [facility] Adult PT Treatment/Exercise - 08/20/16 0001      Knee/Hip Exercises: Stretches   Active Hamstring Stretch 1 rep;10 seconds  manual. discontinued because of quad cramp.    Quad Stretch 1 rep;Both;30 seconds  manual   Gastroc Stretch 2 reps;30 seconds;Both  active     Knee/Hip Exercises: Aerobic   Stationary Bike NuStep lvl 4, 6 minutes     Knee/Hip Exercises: Standing   Hip Flexion Stengthening;Both;2 sets;20 reps  Standing marches   Hip Abduction Stengthening;Both;2 sets;10 reps;AAROM   Lateral Step Up Both;1 set  down and back, lateral side step, no step up   Forward Step Up Both;20 reps;2 sets  toe taps     Knee/Hip Exercises: Seated   Long Arc Quad Strengthening;2 sets;10 reps   Long Arc Quad Weight 10 lbs.  machine   Hamstring Curl Strengthening;Both;3 sets;10 reps  15 pounds     Moist Heat Therapy   Number Minutes Moist Heat 10 Minutes     Manual Therapy   Manual Therapy Passive ROM   Manual therapy comments gastroc, HS, ITB, quad                  PT Short Term Goals - 08/11/16 1717  PT SHORT TERM GOAL #1   Title Pt will demonstrate proper recall of HEP    Time 4   Period Weeks   Status New           PT Long Term Goals - 08/11/16 1717      PT LONG TERM GOAL #1   Title Pt will be able to tolerate HS stretch past 45 degrees hip flexion with knee extended.   Time 8   Period Weeks   Status New     PT LONG TERM GOAL #2   Title Pt will report 0/10 pain and no symptoms in the lateral portion of the leg.   Time 8   Period Weeks   Status New     PT LONG TERM GOAL #3   Title Pt will be able to participate in all normal exercise activities without increase in symptoms.   Time 8   Period Weeks   Status New               Plan - 08/20/16 1127    Clinical Impression Statement Patient tolerated treatment well and was able to complete all exercises without increased symptoms. Pt however continues to voice muscle cramp in her quads with passive hamstring stretch. Patient has shown  increased endurance with exercises and did not need as many rest breaks today. Pt informed that she may have muscle soreness tomorrow due to the fact that she performed more strengthening exercises today.  Pt does however need contact guard assist with certain standing exercises and asked to use wall to help balance with toe tap exercise.   Rehab Potential Good   Clinical Impairments Affecting Rehab Potential Age, pain   PT Frequency 2x / week   PT Duration 8 weeks   PT Treatment/Interventions Moist Heat;Cryotherapy;Electrical Stimulation;Ultrasound;Manual techniques;Therapeutic exercise;Therapeutic activities;Functional mobility training;Passive range of motion   PT Next Visit Plan Standing LE strengthening exercises, balance exercises   Consulted and Agree with Plan of Care Patient      Patient will benefit from skilled therapeutic intervention in order to improve the following deficits and impairments:  Decreased range of motion, Improper body mechanics, Pain, Increased muscle spasms, Decreased balance, Decreased mobility, Decreased endurance, Decreased coordination, Decreased activity tolerance  Visit Diagnosis: No diagnosis found.     Problem List Patient Active Problem List   Diagnosis Date Noted  . Acute blood loss anemia 03/16/2015  . Osteoarthritis of right knee 03/04/2015  . Status post total right knee replacement 03/04/2015  . Visit for suture removal 10/23/2014  . Contusion of knee, right 10/23/2014  . IBS 02/23/2010  . HEMORRHOIDS, EXTERNAL 01/01/2010  . HEMATURIA UNSPECIFIED 11/04/2009  . NAUSEA 06/02/2009  . URINARY INCONTINENCE, STRESS, FEMALE 05/09/2009  . SINUSITIS- ACUTE-NOS 11/25/2008  . UTI 04/23/2008  . COLONIC POLYPS 12/21/2007  . Bipolar disorder ([place]) 12/21/2007  . DEVIATED NASAL SEPTUM 12/21/2007  . HERNIA, VENTRAL 12/21/2007  . OSTEOARTHRITIS 12/21/2007  . Hypothyroidism 09/01/2007  . ECZEMA 09/01/2007  . Psoriasis 09/01/2007  . Osteoporosis  09/01/2007  . Insomnia 09/01/2007    Toy Baker, SPT 08/20/2016, 11:43 AM  [place], [place], [address] Phone: [phone]   Fax:  [phone]  Name: [name] [name] MRN: [id number] Date of Birth: 1933-06-20

## 2016-08-23 ENCOUNTER — Encounter: Payer: Self-pay | Admitting: Physical Therapy

## 2016-08-23 ENCOUNTER — Ambulatory Visit: Payer: Medicare Other | Admitting: Physical Therapy

## 2016-08-23 DIAGNOSIS — M7631 Iliotibial band syndrome, right leg: Secondary | ICD-10-CM | POA: Diagnosis not present

## 2016-08-23 DIAGNOSIS — M79604 Pain in right leg: Secondary | ICD-10-CM | POA: Diagnosis not present

## 2016-08-23 NOTE — Therapy (Signed)
[place], [place], [address] Phone: [phone]   Fax:  [phone]  Physical Therapy Treatment  Patient Details  Name: [name] [name] MRN: [id number] Date of Birth: 1933/05/01 Referring Provider: Erskine Emery  Encounter Date: 08/23/2016      PT End of Session - 08/23/16 1424    Visit Number 5   Date for PT Re-Evaluation 10/11/16   PT Start Time 0145   PT Stop Time 0234   PT Time Calculation (min) 49 min   Activity Tolerance Patient tolerated treatment well;No increased pain;Patient limited by fatigue   Behavior During Therapy [facility] for tasks assessed/performed      Past Medical History:  Diagnosis Date  . Anemia    h/o fibroids  . Bipolar disorder ([place])   . Depression    bipolar  . Eczema   . Hypothyroidism   . Insomnia   . Osteoarthritis   . Osteoporosis   . Psoriasis     Past Surgical History:  Procedure Laterality Date  . ABDOMINAL HYSTERECTOMY  1982  . APPENDECTOMY  2003  . BREAST LUMPECTOMY  1947   left  . CATARACT EXTRACTION  2010  . EYE SURGERY    . HERNIA REPAIR    . KNEE ARTHROSCOPY  09/07/11   right knee  . NASAL SEPTUM SURGERY  1970's  . TONSILLECTOMY  1970's  . TONSILLECTOMY    . TOTAL KNEE ARTHROPLASTY Right 03/04/2015   Procedure: RIGHT TOTAL KNEE ARTHROPLASTY;  Surgeon: Mcarthur Rossetti, MD;  Location: [facility];  Service: Orthopedics;  Laterality: Right;  . VENTRAL HERNIA REPAIR  2005    There were no vitals filed for this visit.      Subjective Assessment - 08/23/16 1346    Subjective Patient states her leg is "doing okay" and her pain is down. She reports that she had more pain this morning but "it worked itself out" and patinet reports she perfromed some of her home stretches to help ease the pain.   Limitations Standing;Walking   Patient Stated Goals have less pain   Currently in Pain? Yes   Pain Score 1    Pain Location Leg   Pain  Orientation Lateral;Right   Pain Descriptors / Indicators Cramping   Pain Type Acute pain   Pain Onset More than a month ago   Multiple Pain Sites No                         [facility] Adult PT Treatment/Exercise - 08/23/16 0001      Ambulation/Gait   Pre-Gait Activities side stepping over foam pad, forward stepping over foam pad  10x each     Knee/Hip Exercises: Stretches   Active Hamstring Stretch 30 seconds;Left;Right;1 rep  foot propped on 6 inch step   Gastroc Stretch 2 reps;30 seconds;Left;Right     Knee/Hip Exercises: Aerobic   Stationary Bike NuStep, lvl 5 8 minutes     Knee/Hip Exercises: Standing   Hip Flexion Stengthening;Right;Left;2 sets;20 reps  standing marches, 2.5 lb ankle weights   Other Standing Knee Exercises Standing calf raises  2.5 lb ankle weights     Knee/Hip Exercises: Seated   Long Arc Quad Strengthening;Right;Left;2 sets;Weights   Long Arc Quad Weight 3 lbs.  ankle weights     Modalities   Modalities Moist Heat     Moist Heat Therapy   Number Minutes Moist Heat 10 Minutes  Manual Therapy   Manual Therapy --   Manual therapy comments --                PT Education - 08/23/16 1424    Education provided No          PT Short Term Goals - 08/20/16 1203      PT SHORT TERM GOAL #1   Title Pt will demonstrate proper recall of HEP    Status Partially Met           PT Long Term Goals - 08/20/16 1203      PT LONG TERM GOAL #1   Title Pt will be able to tolerate HS stretch past 45 degrees hip flexion with knee extended.   Status On-going               Plan - 08/23/16 1425    Clinical Impression Statement Patient tolerated treamtent well but continues to be limited due to fatigue. She has however improved with decreased rest breaks and she is able to complete more exercises before voicing she is "tired". Patient needs contact guard assist with step up exercise and lateral step over exercise. Patient  able to complete almost all stadning exercises with 2.5 lb ankle weights. Continue to progress per pt tolerance. Patient continues to voice "cramps" in her bilateral quad muscles and when asked about hydration, patient states she is "trying" to drink more water.   Rehab Potential Good   Clinical Impairments Affecting Rehab Potential Age, pain   PT Frequency 2x / week   PT Duration 8 weeks   PT Treatment/Interventions Moist Heat;Cryotherapy;Electrical Stimulation;Ultrasound;Manual techniques;Therapeutic exercise;Therapeutic activities;Functional mobility training;Passive range of motion   PT Next Visit Plan Standing LE strengthening exercises, balance exercises, manual stretching   PT Home Exercise Plan gastroc, HS stretch   Consulted and Agree with Plan of Care Patient      Patient will benefit from skilled therapeutic intervention in order to improve the following deficits and impairments:  Decreased range of motion, Improper body mechanics, Pain, Increased muscle spasms, Decreased balance, Decreased mobility, Decreased endurance, Decreased coordination, Decreased activity tolerance  Visit Diagnosis: Pain in right leg  It band syndrome, right     Problem List Patient Active Problem List   Diagnosis Date Noted  . Acute blood loss anemia 03/16/2015  . Osteoarthritis of right knee 03/04/2015  . Status post total right knee replacement 03/04/2015  . Visit for suture removal 10/23/2014  . Contusion of knee, right 10/23/2014  . IBS 02/23/2010  . HEMORRHOIDS, EXTERNAL 01/01/2010  . HEMATURIA UNSPECIFIED 11/04/2009  . NAUSEA 06/02/2009  . URINARY INCONTINENCE, STRESS, FEMALE 05/09/2009  . SINUSITIS- ACUTE-NOS 11/25/2008  . UTI 04/23/2008  . COLONIC POLYPS 12/21/2007  . Bipolar disorder ([place]) 12/21/2007  . DEVIATED NASAL SEPTUM 12/21/2007  . HERNIA, VENTRAL 12/21/2007  . OSTEOARTHRITIS 12/21/2007  . Hypothyroidism 09/01/2007  . ECZEMA 09/01/2007  . Psoriasis 09/01/2007  .  Osteoporosis 09/01/2007  . Insomnia 09/01/2007    Toy Baker, SPT 08/23/2016, 2:33 PM  [place], [place], [address] Phone: [phone]   Fax:  [phone]  Name: [name] [name] MRN: [id number] Date of Birth: 14-Sep-1933

## 2016-08-25 ENCOUNTER — Ambulatory Visit (INDEPENDENT_AMBULATORY_CARE_PROVIDER_SITE_OTHER): Payer: Medicare Other | Admitting: Podiatry

## 2016-08-25 VITALS — Resp 16 | Wt 161.0 lb

## 2016-08-25 DIAGNOSIS — M79676 Pain in unspecified toe(s): Secondary | ICD-10-CM | POA: Diagnosis not present

## 2016-08-25 DIAGNOSIS — M79671 Pain in right foot: Secondary | ICD-10-CM

## 2016-08-25 DIAGNOSIS — Q828 Other specified congenital malformations of skin: Secondary | ICD-10-CM

## 2016-08-25 DIAGNOSIS — M79672 Pain in left foot: Secondary | ICD-10-CM

## 2016-08-25 DIAGNOSIS — B351 Tinea unguium: Secondary | ICD-10-CM

## 2016-08-25 NOTE — Progress Notes (Signed)
Subjective:     Patient ID: [name] [name], female   DOB: 10/16/1933, 83 y.o.   MRN: [id number]  HPI This patients presents to office for preventive foot care services.  She develops callus on the ball of both feet which are very painful when walking.  She has also developed long nails since her last visit.  She presents for preventive foot care services.  Review of Systems     Objective:   Physical Exam GENERAL APPEARANCE: Alert, conversant. Appropriately groomed. No acute distress.  VASCULAR: Pedal pulses palpable at 2/4 DP and PT bilateral.  Capillary refill time is immediate to all digits,  Proximal to distal cooling it warm to warm.  Digital hair growth is present bilateral  NEUROLOGIC: sensation is intact epicritically and protectively to 5.07 monofilament at 5/5 sites bilateral.  Light touch is intact bilateral, vibratory sensation intact bilateral, achilles tendon reflex is intact bilateral.  MUSCULOSKELETAL: acceptable muscle strength, tone and stability bilateral.  Intrinsic muscluature intact bilateral.  Rectus appearance of foot and digits noted bilateral.   DERMATOLOGIC: skin color, texture, and turgor are within normal limits.  No preulcerative lesions or ulcers  are seen, no interdigital maceration noted.  No open lesions present.  . No drainage noted. Callus sub5th metatarsal both feet.      Assessment:     Callus  B/L.  Onychomycosis     Plan:     Debride Nails.  Debride porokeratosis  B/L   Taesean Reth DPM      

## 2016-08-27 ENCOUNTER — Telehealth: Payer: Self-pay | Admitting: Family Medicine

## 2016-08-27 NOTE — Telephone Encounter (Signed)
Prolia benefits verified NO prior auth required No deductible or OOP for the patient  Spoke with patient and informed of benefits, patient is agreeable to injection but she is busy next week so she will call back to schedule. Message send to Gilmore Laroche to order injection.

## 2016-09-02 ENCOUNTER — Ambulatory Visit (INDEPENDENT_AMBULATORY_CARE_PROVIDER_SITE_OTHER): Payer: Medicare Other | Admitting: Physician Assistant

## 2016-09-02 DIAGNOSIS — M25561 Pain in right knee: Secondary | ICD-10-CM

## 2016-09-02 DIAGNOSIS — G8929 Other chronic pain: Secondary | ICD-10-CM | POA: Diagnosis not present

## 2016-09-02 DIAGNOSIS — M25551 Pain in right hip: Secondary | ICD-10-CM | POA: Diagnosis not present

## 2016-09-02 NOTE — Progress Notes (Signed)
Office Visit Note   Patient: [name] [name]           Date of Birth: 02/08/1933           MRN: [id number] Visit Date: 09/02/2016              Requested by: Ann Held, DO [address] [place], [place] [address] PCP: Ann Held, DO   Assessment & Plan: Visit Diagnoses:  1. Chronic pain of right knee   2. Pain of right hip joint     Plan: Continue physical therapy to work on strength balance mobility gait quad strengthening and IT band stretching  Follow-Up Instructions: No Follow-up on file.   Orders:  No orders of the defined types were placed in this encounter.  No orders of the defined types were placed in this encounter.     Procedures: No procedures performed   Clinical Data: No additional findings.   Subjective: Chief Complaint  Patient presents with  . Right Knee - Follow-up  . Right Hip - Pain    [name] [name] for follow up right knee and hip pain. She has been doing physical therapy and states that it has really helped her. She would like to continue physical therapy therapist does have a note in the chart states that she would benefit from therapy. Work on Conservation officer, nature and endurance.    Review of Systems   Objective: Vital Signs: There were no vitals taken for this visit.  Physical Exam  Ortho Exam Right hip excellent range of motion without pain no tenderness over the greater trochanteric region. No tenderness down the IT band. Right knee full extension and flexion. No tenderness of the knee today no instability. Specialty Comments:  No specialty comments available.  Imaging: No results found.   PMFS History: Patient Active Problem List   Diagnosis Date Noted  . Acute blood loss anemia 03/16/2015  . Osteoarthritis of right knee 03/04/2015  . Status post total right knee replacement 03/04/2015  . Visit for suture removal 10/23/2014  . Contusion of knee, right 10/23/2014  . IBS 02/23/2010  .  HEMORRHOIDS, EXTERNAL 01/01/2010  . HEMATURIA UNSPECIFIED 11/04/2009  . NAUSEA 06/02/2009  . URINARY INCONTINENCE, STRESS, FEMALE 05/09/2009  . SINUSITIS- ACUTE-NOS 11/25/2008  . UTI 04/23/2008  . COLONIC POLYPS 12/21/2007  . Bipolar disorder ([place]) 12/21/2007  . DEVIATED NASAL SEPTUM 12/21/2007  . HERNIA, VENTRAL 12/21/2007  . OSTEOARTHRITIS 12/21/2007  . Hypothyroidism 09/01/2007  . ECZEMA 09/01/2007  . Psoriasis 09/01/2007  . Osteoporosis 09/01/2007  . Insomnia 09/01/2007   Past Medical History:  Diagnosis Date  . Anemia    h/o fibroids  . Bipolar disorder ([place])   . Depression    bipolar  . Eczema   . Hypothyroidism   . Insomnia   . Osteoarthritis   . Osteoporosis   . Psoriasis     Family History  Problem Relation Age of Onset  . Colon cancer Brother   . Lung cancer Father   . Prostate cancer Father   . Arthritis Father   . Cancer Father     lung, prostate  . Heart disease Brother   . Cancer Brother     lung  . Lung cancer Brother   . Aortic aneurysm Brother   . Cancer Other     colon    Past Surgical History:  Procedure Laterality Date  . ABDOMINAL HYSTERECTOMY  1982  . APPENDECTOMY  2003  . BREAST LUMPECTOMY  1947   left  . CATARACT EXTRACTION  2010  . EYE SURGERY    . HERNIA REPAIR    . KNEE ARTHROSCOPY  09/07/11   right knee  . NASAL SEPTUM SURGERY  1970's  . TONSILLECTOMY  1970's  . TONSILLECTOMY    . TOTAL KNEE ARTHROPLASTY Right 03/04/2015   Procedure: RIGHT TOTAL KNEE ARTHROPLASTY;  Surgeon: Mcarthur Rossetti, MD;  Location: [facility];  Service: Orthopedics;  Laterality: Right;  . VENTRAL HERNIA REPAIR  2005   Social History   Occupational History  . retired Retired   Social History Main Topics  . Smoking status: Former Smoker    Quit date: 09/29/1985  . Smokeless tobacco: Never Used  . Alcohol use No  . Drug use: No  . Sexual activity: Not Currently    Partners: Male

## 2016-09-07 ENCOUNTER — Encounter: Payer: Self-pay | Admitting: Physical Therapy

## 2016-09-07 ENCOUNTER — Ambulatory Visit: Payer: Medicare Other | Admitting: Physical Therapy

## 2016-09-07 DIAGNOSIS — M79604 Pain in right leg: Secondary | ICD-10-CM | POA: Diagnosis not present

## 2016-09-07 DIAGNOSIS — M7631 Iliotibial band syndrome, right leg: Secondary | ICD-10-CM

## 2016-09-07 NOTE — Therapy (Signed)
[address] [place], [place], [address] Phone: [phone]   Fax:  [phone]  Physical Therapy Treatment  Patient Details  Name: [name] [name] MRN: [id number] Date of Birth: 04-29-1933 Referring Provider: Erskine Emery  Encounter Date: 09/07/2016      PT End of Session - 09/07/16 1614    Visit Number 6   Date for PT Re-Evaluation 10/11/16   PT Start Time 0330   PT Stop Time [id number]   PT Time Calculation (min) 45 min   Activity Tolerance Patient tolerated treatment well;No increased pain   Behavior During Therapy WFL for tasks assessed/performed      Past Medical History:  Diagnosis Date  . Anemia    h/o fibroids  . Bipolar disorder ([place])   . Depression    bipolar  . Eczema   . Hypothyroidism   . Insomnia   . Osteoarthritis   . Osteoporosis   . Psoriasis     Past Surgical History:  Procedure Laterality Date  . ABDOMINAL HYSTERECTOMY  1982  . APPENDECTOMY  2003  . BREAST LUMPECTOMY  1947   left  . CATARACT EXTRACTION  2010  . EYE SURGERY    . HERNIA REPAIR    . KNEE ARTHROSCOPY  09/07/11   right knee  . NASAL SEPTUM SURGERY  1970's  . TONSILLECTOMY  1970's  . TONSILLECTOMY    . TOTAL KNEE ARTHROPLASTY Right 03/04/2015   Procedure: RIGHT TOTAL KNEE ARTHROPLASTY;  Surgeon: Mcarthur Rossetti, MD;  Location: [facility];  Service: Orthopedics;  Laterality: Right;  . VENTRAL HERNIA REPAIR  2005    There were no vitals filed for this visit.      Subjective Assessment - 09/07/16 1543    Subjective Patient states she is doing well. She went to the [facility] this morning and worked out on a "cardio machine" and states her legs are sore.   Limitations Standing;Walking   Patient Stated Goals have less pain   Currently in Pain? No/denies                         [facility] Adult PT Treatment/Exercise - 09/07/16 0001      Ambulation/Gait   Ambulation/Gait Yes   Ambulation/Gait  Assistance 4: Min guard  stepping over foam roll 5x   Stairs Yes   Stair Management Technique One rail Right   Number of Stairs 12  up/down 2 times   Height of Stairs 6     High Level Balance   High Level Balance Comments Static stand, staggered stand, 4x30 sec     Knee/Hip Exercises: Machines for Strengthening   Cybex Knee Extension 10 lb, x20   Cybex Knee Flexion 20 lb, x20     Modalities   Modalities Moist Heat     Moist Heat Therapy   Number Minutes Moist Heat 10 Minutes   Moist Heat Location Hip     Manual Therapy   Manual Therapy --                PT Education - 09/07/16 1613    Education provided No          PT Short Term Goals - 09/07/16 1617      PT SHORT TERM GOAL #1   Status Achieved           PT Long Term Goals - 09/07/16 1617      PT LONG  TERM GOAL #1   Status On-going     PT LONG TERM GOAL #2   Status Achieved     PT LONG TERM GOAL #3   Status Achieved               Plan - 09/07/16 1614    Clinical Impression Statement Patient tolerated treatment well and was able to complete all exercises. She didn't need as many rest breaks today andher endurance has improved. Patient needed stand by assist for high level balance exercises and gait training however she did not stumble. She only voiced feeling off balance. Conitnue to progress per pt tolerance.   Rehab Potential Good   Clinical Impairments Affecting Rehab Potential Age, pain   PT Frequency 2x / week   PT Duration 8 weeks   PT Treatment/Interventions Moist Heat;Cryotherapy;Electrical Stimulation;Ultrasound;Manual techniques;Therapeutic exercise;Therapeutic activities;Functional mobility training;Passive range of motion   PT Next Visit Plan Standing LE strengthening exercises, balance exercises, manual stretching, step navigation, resistance training   PT Home Exercise Plan gastroc, HS stretch   Consulted and Agree with Plan of Care Patient      Patient will benefit  from skilled therapeutic intervention in order to improve the following deficits and impairments:  Decreased range of motion, Improper body mechanics, Pain, Increased muscle spasms, Decreased balance, Decreased mobility, Decreased endurance, Decreased coordination, Decreased activity tolerance  Visit Diagnosis: Pain in right leg  It band syndrome, right     Problem List Patient Active Problem List   Diagnosis Date Noted  . Acute blood loss anemia 03/16/2015  . Osteoarthritis of right knee 03/04/2015  . Status post total right knee replacement 03/04/2015  . Visit for suture removal 10/23/2014  . Contusion of knee, right 10/23/2014  . IBS 02/23/2010  . HEMORRHOIDS, EXTERNAL 01/01/2010  . HEMATURIA UNSPECIFIED 11/04/2009  . NAUSEA 06/02/2009  . URINARY INCONTINENCE, STRESS, FEMALE 05/09/2009  . SINUSITIS- ACUTE-NOS 11/25/2008  . UTI 04/23/2008  . COLONIC POLYPS 12/21/2007  . Bipolar disorder ([name]) 12/21/2007  . DEVIATED NASAL SEPTUM 12/21/2007  . HERNIA, VENTRAL 12/21/2007  . OSTEOARTHRITIS 12/21/2007  . Hypothyroidism 09/01/2007  . ECZEMA 09/01/2007  . Psoriasis 09/01/2007  . Osteoporosis 09/01/2007  . Insomnia 09/01/2007    Toy Baker, SPT 09/07/2016, 4:18 PM  [place], [place], [address] Phone: [phone]   Fax:  [phone]  Name: JANELLIE BENGOCHEA MRN: [id number] Date of Birth: 1933-03-02

## 2016-09-15 NOTE — Telephone Encounter (Signed)
Pt called to schedule [facility]. I put her on your schedule for 12/15 3:30pm. She would like Prolia same day. I'm guessing it needs ordered.

## 2016-09-15 NOTE — Telephone Encounter (Signed)
Noted, Prolia is in fridge w/ pt's name on it.

## 2016-09-27 ENCOUNTER — Encounter: Payer: Self-pay | Admitting: Family Medicine

## 2016-09-27 ENCOUNTER — Ambulatory Visit (HOSPITAL_BASED_OUTPATIENT_CLINIC_OR_DEPARTMENT_OTHER)
Admission: RE | Admit: 2016-09-27 | Discharge: 2016-09-27 | Disposition: A | Discharge status: Home / Self Care | Payer: Medicare Other | Source: Ambulatory Visit | Attending: Family Medicine | Admitting: Family Medicine

## 2016-09-27 ENCOUNTER — Ambulatory Visit (INDEPENDENT_AMBULATORY_CARE_PROVIDER_SITE_OTHER): Payer: Medicare Other | Admitting: Family Medicine

## 2016-09-27 VITALS — BP 122/78 | HR 97 | Temp 98.9°F | Resp 16 | Ht 65.0 in | Wt 168.4 lb

## 2016-09-27 DIAGNOSIS — I700 Atherosclerosis of aorta: Secondary | ICD-10-CM | POA: Diagnosis not present

## 2016-09-27 DIAGNOSIS — J449 Chronic obstructive pulmonary disease, unspecified: Secondary | ICD-10-CM | POA: Insufficient documentation

## 2016-09-27 DIAGNOSIS — J209 Acute bronchitis, unspecified: Secondary | ICD-10-CM

## 2016-09-27 DIAGNOSIS — R059 Cough, unspecified: Secondary | ICD-10-CM

## 2016-09-27 DIAGNOSIS — R05 Cough: Secondary | ICD-10-CM

## 2016-09-27 DIAGNOSIS — J440 Chronic obstructive pulmonary disease with acute lower respiratory infection: Secondary | ICD-10-CM

## 2016-09-27 MED ORDER — BENZONATATE 100 MG PO CAPS: 100.0000 mg | ORAL_CAPSULE | Freq: Three times a day (TID) | ORAL | 0 refills | Status: DC | PRN

## 2016-09-27 MED ORDER — PREDNISONE 10 MG PO TABS: ORAL_TABLET | ORAL | 0 refills | Status: DC

## 2016-09-27 MED ORDER — LEVOCETIRIZINE DIHYDROCHLORIDE 5 MG PO TABS: 5.0000 mg | ORAL_TABLET | Freq: Every evening | ORAL | 5 refills | Status: DC

## 2016-09-27 NOTE — Progress Notes (Signed)
Subjective:     [name] [name] is a 82 y.o. female here for evaluation of a cough. Onset of symptoms was 4 days ago. Symptoms have been gradually worsening since that time. The cough is dry and is aggravated by reclining position. Associated symptoms include: chills and postnasal drip. Patient does not have a history of asthma. Patient does not have a history of environmental allergens. Patient has traveled recently. Patient does have a history of smoking in  Patient has not had a previous chest x-ray. Patient has not had a PPD done.  She was in [place] and got back Friday night.  It was a bus tour.  +  The following portions of the patient's history were reviewed and updated as appropriate: She  has a past medical history of Anemia; Bipolar disorder ([place]); Depression; Eczema; Hypothyroidism; Insomnia; Osteoarthritis; Osteoporosis; and Psoriasis. She  does not have any pertinent problems on file. She  has a past surgical history that includes Appendectomy (2003); Abdominal hysterectomy (1982); Tonsillectomy (1970's); Ventral hernia repair (2005); Breast lumpectomy (1947); Cataract extraction (2010); Knee arthroscopy (09/07/11); Nasal septum surgery (1970's); Tonsillectomy; Eye surgery; Hernia repair; and Total knee arthroplasty (Right, 03/04/2015). Her family history includes Aortic aneurysm in her brother; Arthritis in her father; Cancer in her brother, father, and other; Colon cancer in her brother; Heart disease in her brother; Lung cancer in her brother and father; Prostate cancer in her father. She  reports that she quit smoking about 31 years ago. She has never used smokeless tobacco. She reports that she does not drink alcohol or use drugs. She has a current medication list which includes the following prescription(s): ammonium lactate, aripiprazole, b complex vitamins, calcium carbonate, cholecalciferol, b-12, docusate sodium, fish oil-omega-3 fatty acids, glucosamine, hypromellose, levothyroxine,  levothyroxine, lutein, methocarbamol, multivitamin, and zolpidem. Current Outpatient Prescriptions on File Prior to Visit  Medication Sig Dispense Refill  . ammonium lactate (LAC-HYDRIN) 12 % cream Apply topically as needed for dry skin. 385 g 0  . ARIPiprazole (ABILIFY) 5 MG tablet Take 1 tablet by mouth daily.    Marland Kitchen b complex vitamins tablet Take 2 tablets by mouth daily.    . calcium carbonate (OS-CAL) 600 MG TABS Take 600 mg by mouth 2 (two) times daily with a meal.      . cholecalciferol (VITAMIN D) 1000 UNITS tablet Take 2,000 Units by mouth daily.     . Cyanocobalamin (B-12) 2500 MCG TABS Take 1 tablet by mouth daily.    Marland Kitchen docusate sodium (COLACE) 100 MG capsule Take 1 capsule (100 mg total) by mouth 2 (two) times daily. 10 capsule 0  . fish oil-omega-3 fatty acids 1000 MG capsule Take 1 g by mouth daily.      . Glucosamine 500 MG TABS Take 1,000 mg by mouth daily.     . hypromellose (GENTEAL) 0.3 % GEL ophthalmic ointment Place 1 application into both eyes at bedtime.    Marland Kitchen levothyroxine (SYNTHROID, LEVOTHROID) 75 MCG tablet TAKE 1 TABLET BY MOUTH DAILY BEFORE BREAKFAST. 90 tablet 1  . levothyroxine (SYNTHROID, LEVOTHROID) 75 MCG tablet TAKE 1 TABLET BY MOUTH DAILY BEFORE BREAKFAST. 90 tablet 1  . Lutein 40 MG CAPS Take 40 mg by mouth daily.     . methocarbamol (ROBAXIN) 500 MG tablet Take 1 tablet (500 mg total) by mouth every 6 (six) hours as needed for muscle spasms. 40 tablet 1  . multivitamin (THERAGRAN) per tablet Take 1 tablet by mouth daily.      Marland Kitchen zolpidem (  AMBIEN) 10 MG tablet Take 10 mg by mouth at bedtime.      No current facility-administered medications on file prior to visit.    She is allergic to bupropion hcl..  Review of Systems Pertinent items are noted in HPI.    Objective:    Oxygen saturation 97% on Oxygen saturation 97% on room air BP 122/78 (BP Location: Right Arm, Patient Position: Sitting, Cuff Size: Normal)   Pulse 97   Temp 98.9 F (37.2 C) (Oral)    Resp 16   Ht 5\' 5"  (1.651 m)   Wt 168 lb 6.4 oz (76.4 kg)   SpO2 97%   BMI 28.02 kg/m  General appearance: alert, cooperative, appears stated age and no distress Ears: normal TM's and external ear canals both ears Nose: Nares normal. Septum midline. Mucosa normal. No drainage or sinus tenderness. Throat: lips, mucosa, and tongue normal; teeth and gums normal Neck: no adenopathy, no carotid bruit, no JVD, supple, symmetrical, trachea midline and thyroid not enlarged, symmetric, no tenderness/mass/nodules Lungs: wheezes b/l Heart: regular rate and rhythm, S1, S2 normal, no murmur, click, rub or gallop    Assessment:    Acute Bronchitis    Plan:    Explained lack of efficacy of antibiotics in viral disease. Antitussives per medication orders. Avoid exposure to tobacco smoke and fumes. Call if shortness of breath worsens, blood in sputum, change in character of cough, development of fever or chills, inability to maintain nutrition and hydration. Avoid exposure to tobacco smoke and fumes. Chest x-ray. pred taper

## 2016-09-27 NOTE — Progress Notes (Signed)
Pre visit review using our clinic review tool, if applicable. No additional management support is needed unless otherwise documented below in the visit note. 

## 2016-09-27 NOTE — Patient Instructions (Addendum)
Debrox or colace  For ear wax    Acute Bronchitis, Adult Acute bronchitis is when air tubes (bronchi) in the lungs suddenly get swollen. The condition can make it hard to breathe. It can also cause these symptoms:  A cough.  Coughing up clear, yellow, or green mucus.  Wheezing.  Chest congestion.  Shortness of breath.  A fever.  Body aches.  Chills.  A sore throat. Follow these instructions at home: Medicines  Take over-the-counter and prescription medicines only as told by your doctor.  If you were prescribed an antibiotic medicine, take it as told by your doctor. Do not stop taking the antibiotic even if you start to feel better. General instructions  Rest.  Drink enough fluids to keep your pee (urine) clear or pale yellow.  Avoid smoking and secondhand smoke. If you smoke and you need help quitting, ask your doctor. Quitting will help your lungs heal faster.  Use an inhaler, cool mist vaporizer, or humidifier as told by your doctor.  Keep all follow-up visits as told by your doctor. This is important. How is this prevented? To lower your risk of getting this condition again:  Wash your hands often with soap and water. If you cannot use soap and water, use hand sanitizer.  Avoid contact with people who have cold symptoms.  Try not to touch your hands to your mouth, nose, or eyes.  Make sure to get the flu shot every year. Contact a doctor if:  Your symptoms do not get better in 2 weeks. Get help right away if:  You cough up blood.  You have chest pain.  You have very bad shortness of breath.  You become dehydrated.  You faint (pass out) or keep feeling like you are going to pass out.  You keep throwing up (vomiting).  You have a very bad headache.  Your fever or chills gets worse. This information is not intended to replace advice given to you by your health care provider. Make sure you discuss any questions you have with your health care  provider. Document Released: 03/22/2008 Document Revised: 05/12/2016 Document Reviewed: 03/24/2016 Elsevier Interactive Patient Education  2017 [employer].

## 2016-09-29 ENCOUNTER — Ambulatory Visit: Payer: Medicare Other | Admitting: *Deleted

## 2016-09-29 ENCOUNTER — Telehealth: Payer: Self-pay | Admitting: Family Medicine

## 2016-09-29 NOTE — Telephone Encounter (Signed)
°  Relation to [id number] Call back [phone] Pharmacy:[facility]  Reason for call: pt was seen on 12/11, pt states she is no better, and is needing a antibiotic, states her cough has gotten worse and she is now coughing up green mucus. Pt states she does not feel like coming back in. Please advise

## 2016-09-29 NOTE — Telephone Encounter (Signed)
Spoke to pt today and informed pt provider is out of the office but informed covering provider Debbrah Alar of pt's status. Forward message to [place]. Pleas advise. LB

## 2016-09-29 NOTE — Telephone Encounter (Signed)
See result note. Tks.

## 2016-09-30 ENCOUNTER — Telehealth: Payer: Self-pay | Admitting: Family Medicine

## 2016-09-30 NOTE — Progress Notes (Signed)
Pre visit review using our clinic review tool, if applicable. No additional management support is needed unless otherwise documented below in the visit note. 

## 2016-09-30 NOTE — Progress Notes (Signed)
   Pt scheduled for AWV, presented to office c/o ongoing productive cough, congestion, headache, and chills since acute visit on Monday. She is taking meds as directed w/ no improvement. VSS stable w/ exception of elevated BP-pt feels r/t strong cough.  Per Dr. Carollee Herter: Start z-pack and follow-up in office on Monday. Urgent care/ED over the weekend if worsening.   Medication filled to pharmacy as requested. Pt notified of instructions and verbalized understanding.  Acute appt scheduled w/ Dr. Charlett Blake on Tuesday per pt request.   Dorrene German, RN

## 2016-09-30 NOTE — Telephone Encounter (Signed)
Patient scheduled medicare wellness appointment for 10/01/16 with [name]

## 2016-10-01 ENCOUNTER — Ambulatory Visit (INDEPENDENT_AMBULATORY_CARE_PROVIDER_SITE_OTHER): Payer: Medicare Other | Admitting: *Deleted

## 2016-10-01 VITALS — BP 164/94 | HR 72 | Resp 18 | Ht 66.0 in | Wt 166.0 lb

## 2016-10-01 DIAGNOSIS — R6883 Chills (without fever): Secondary | ICD-10-CM

## 2016-10-01 DIAGNOSIS — R059 Cough, unspecified: Secondary | ICD-10-CM

## 2016-10-01 DIAGNOSIS — R05 Cough: Secondary | ICD-10-CM

## 2016-10-01 MED ORDER — AZITHROMYCIN 250 MG PO TABS: ORAL_TABLET | ORAL | 0 refills | Status: DC

## 2016-10-01 NOTE — Patient Instructions (Signed)
Per Dr. Carollee Herter: Start z-pack. Take as directed.  Return for follow-up visit early next week.  If any worsening of symptoms, please go to urgent care or ED over the weekend.

## 2016-10-05 ENCOUNTER — Encounter: Payer: Self-pay | Admitting: Family Medicine

## 2016-10-05 ENCOUNTER — Ambulatory Visit (INDEPENDENT_AMBULATORY_CARE_PROVIDER_SITE_OTHER): Payer: Medicare Other | Admitting: Family Medicine

## 2016-10-05 ENCOUNTER — Telehealth: Payer: Self-pay | Admitting: *Deleted

## 2016-10-05 VITALS — BP 156/80 | HR 96 | Temp 97.4°F | Resp 16 | Ht 66.0 in | Wt 163.0 lb

## 2016-10-05 DIAGNOSIS — M810 Age-related osteoporosis without current pathological fracture: Secondary | ICD-10-CM

## 2016-10-05 DIAGNOSIS — L409 Psoriasis, unspecified: Secondary | ICD-10-CM

## 2016-10-05 DIAGNOSIS — K589 Irritable bowel syndrome without diarrhea: Secondary | ICD-10-CM | POA: Diagnosis not present

## 2016-10-05 DIAGNOSIS — E039 Hypothyroidism, unspecified: Secondary | ICD-10-CM | POA: Diagnosis not present

## 2016-10-05 DIAGNOSIS — J019 Acute sinusitis, unspecified: Secondary | ICD-10-CM | POA: Diagnosis not present

## 2016-10-05 MED ORDER — DENOSUMAB 60 MG/ML ~~LOC~~ SOLN
60.0000 mg | Freq: Once | SUBCUTANEOUS | Status: AC
  Administered 2016-10-05: 60 mg via SUBCUTANEOUS

## 2016-10-05 MED ORDER — ALBUTEROL SULFATE HFA 108 (90 BASE) MCG/ACT IN AERS: 1.0000 | INHALATION_SPRAY | Freq: Four times a day (QID) | RESPIRATORY_TRACT | 1 refills | Status: DC | PRN

## 2016-10-05 NOTE — Telephone Encounter (Signed)
Pt requests referral to dermatology for psoriasis. Ok per Dr. Charlett Blake. Referral placed.

## 2016-10-05 NOTE — Patient Instructions (Addendum)
[employer] Probiotic for your stomach.   Zinc.    Mucinex  1 tablet twice daily. Osteoporosis Osteoporosis is the thinning and loss of density in the bones. Osteoporosis makes the bones more brittle, fragile, and likely to break (fracture). Over time, osteoporosis can cause the bones to become so weak that they fracture after a simple fall. The bones most likely to fracture are the bones in the hip, wrist, and spine. What are the causes? The exact cause is not known. What increases the risk? Anyone can develop osteoporosis. You may be at greater risk if you have a family history of the condition or have poor nutrition. You may also have a higher risk if you are:  Female.  80 years old or older.  A smoker.  Not physically active.  White or Asian.  Slender. What are the signs or symptoms? A fracture might be the first sign of the disease, especially if it results from a fall or injury that would not usually cause a bone to break. Other signs and symptoms include:  Low back and neck pain.  Stooped posture.  Height loss. How is this diagnosed? To make a diagnosis, your health care provider may:  Take a medical history.  Perform a physical exam.  Order tests, such as:  A bone mineral density test.  A dual-energy X-ray absorptiometry test. How is this treated? The goal of osteoporosis treatment is to strengthen your bones to reduce your risk of a fracture. Treatment may involve:  Making lifestyle changes, such as:  Eating a diet rich in calcium.  Doing weight-bearing and muscle-strengthening exercises.  Stopping tobacco use.  Limiting alcohol intake.  Taking medicine to slow the process of bone loss or to increase bone density.  Monitoring your levels of calcium and vitamin D. Follow these instructions at home:  Include calcium and vitamin D in your diet. Calcium is important for bone health, and vitamin D helps the body absorb calcium.  Perform weight-bearing  and muscle-strengthening exercises as directed by your health care provider.  Do not use any tobacco products, including cigarettes, chewing tobacco, and electronic cigarettes. If you need help quitting, ask your health care provider.  Limit your alcohol intake.  Take medicines only as directed by your health care provider.  Keep all follow-up visits as directed by your health care provider. This is important.  Take precautions at home to lower your risk of falling, such as:  Keeping rooms well lit and clutter free.  Installing safety rails on stairs.  Using rubber mats in the bathroom and other areas that are often wet or slippery. Get help right away if: You fall or injure yourself. This information is not intended to replace advice given to you by your health care provider. Make sure you discuss any questions you have with your health care provider. Document Released: 07/14/2005 Document Revised: 03/08/2016 Document Reviewed: 03/14/2014 Elsevier Interactive Patient Education  2017 [employer].

## 2016-10-05 NOTE — Progress Notes (Signed)
Pre visit review using our clinic review tool, if applicable. No additional management support is needed unless otherwise documented below in the visit note. 

## 2016-10-05 NOTE — Progress Notes (Signed)
Subjective:    Patient ID: [name] [name], female    DOB: 1933-04-16, 82 y.o.   MRN: [id number]  Chief Complaint  Patient presents with  . needs prolia  . needs a dermatologist referral    HPI Patient is in today for follow up cough, prolia injection,  and dermatology referral.  Feels much better after taking antibiotic.   Has had eczema on legs for long time and is requesting a referral to dermatology for further management since it is not responding to previous treatments. She is feeling much better s/p Zpak for sinusitis. Symptoms essentially resolved. Mild congestion still present. Denies CP/palp/SOB/HA/fevers/GI or GU c/o. Taking meds as prescribed  Past Medical History:  Diagnosis Date  . Anemia    h/o fibroids  . Bipolar disorder ([place])   . Depression    bipolar  . Eczema   . Hypothyroidism   . Insomnia   . Osteoarthritis   . Osteoporosis   . Psoriasis     Past Surgical History:  Procedure Laterality Date  . ABDOMINAL HYSTERECTOMY  1982  . APPENDECTOMY  2003  . BREAST LUMPECTOMY  1947   left  . CATARACT EXTRACTION  2010  . EYE SURGERY    . HERNIA REPAIR    . KNEE ARTHROSCOPY  09/07/11   right knee  . NASAL SEPTUM SURGERY  1970's  . TONSILLECTOMY  1970's  . TONSILLECTOMY    . TOTAL KNEE ARTHROPLASTY Right 03/04/2015   Procedure: RIGHT TOTAL KNEE ARTHROPLASTY;  Surgeon: Mcarthur Rossetti, MD;  Location: [facility];  Service: Orthopedics;  Laterality: Right;  . VENTRAL HERNIA REPAIR  2005    Family History  Problem Relation Age of Onset  . Colon cancer Brother   . Lung cancer Father   . Prostate cancer Father   . Arthritis Father   . Cancer Father     lung, prostate  . Heart disease Brother   . Cancer Brother     lung  . Lung cancer Brother   . Aortic aneurysm Brother   . Cancer Other     colon    Social History   Social History  . Marital status: Single    Spouse name: N/A  . Number of children: 1  . Years of education: N/A   Occupational  History  . retired Retired   Social History Main Topics  . Smoking status: Former Smoker    Quit date: 09/29/1985  . Smokeless tobacco: Never Used  . Alcohol use No  . Drug use: No  . Sexual activity: Not Currently    Partners: Male   Other Topics Concern  . Not on file   Social History Narrative   Lives by herself   PT for knee----silver sneakers    Outpatient Medications Prior to Visit  Medication Sig Dispense Refill  . ammonium lactate (LAC-HYDRIN) 12 % cream Apply topically as needed for dry skin. 385 g 0  . ARIPiprazole (ABILIFY) 5 MG tablet Take 1 tablet by mouth daily.    Marland Kitchen azithromycin (ZITHROMAX) 250 MG tablet Take as directed. 6 tablet 0  . b complex vitamins tablet Take 2 tablets by mouth daily.    . benzonatate (TESSALON) 100 MG capsule Take 1 capsule (100 mg total) by mouth 3 (three) times daily as needed for cough. 20 capsule 0  . calcium carbonate (OS-CAL) 600 MG TABS Take 600 mg by mouth 2 (two) times daily with a meal.      . cholecalciferol (VITAMIN  D) 1000 UNITS tablet Take 2,000 Units by mouth daily.     . Cyanocobalamin (B-12) 2500 MCG TABS Take 1 tablet by mouth daily.    Marland Kitchen docusate sodium (COLACE) 100 MG capsule Take 1 capsule (100 mg total) by mouth 2 (two) times daily. 10 capsule 0  . fish oil-omega-3 fatty acids 1000 MG capsule Take 1 g by mouth daily.      . Glucosamine 500 MG TABS Take 1,000 mg by mouth daily.     . hypromellose (GENTEAL) 0.3 % GEL ophthalmic ointment Place 1 application into both eyes at bedtime.    Marland Kitchen levocetirizine (XYZAL) 5 MG tablet Take 1 tablet (5 mg total) by mouth every evening. 30 tablet 5  . levothyroxine (SYNTHROID, LEVOTHROID) 75 MCG tablet TAKE 1 TABLET BY MOUTH DAILY BEFORE BREAKFAST. 90 tablet 1  . levothyroxine (SYNTHROID, LEVOTHROID) 75 MCG tablet TAKE 1 TABLET BY MOUTH DAILY BEFORE BREAKFAST. 90 tablet 1  . Lutein 40 MG CAPS Take 40 mg by mouth daily.     . methocarbamol (ROBAXIN) 500 MG tablet Take 1 tablet (500 mg  total) by mouth every 6 (six) hours as needed for muscle spasms. 40 tablet 1  . multivitamin (THERAGRAN) per tablet Take 1 tablet by mouth daily.      . predniSONE (DELTASONE) 10 MG tablet 3 po qd for 3 days then 2 po qd for 3 days then 1 po qd for 3 days 18 tablet 0  . zolpidem (AMBIEN) 10 MG tablet Take 10 mg by mouth at bedtime.      No facility-administered medications prior to visit.     Allergies  Allergen Reactions  . Bupropion Hcl Rash    Review of Systems  Constitutional: Negative for fever.  Eyes: Negative for blurred vision.  Respiratory: Negative for cough and shortness of breath.   Cardiovascular: Negative for chest pain and palpitations.  Gastrointestinal: Negative for vomiting.  Musculoskeletal: Negative for back pain.  Skin: Negative for rash.       eczem on both legs  Neurological: Negative for loss of consciousness and headaches.       Objective:    Physical Exam  Constitutional: She is oriented to person, place, and time. She appears well-developed and well-nourished. No distress.  HENT:  Head: Normocephalic and atraumatic.  Eyes: Conjunctivae are normal.  Neck: Normal range of motion. No thyromegaly present.  Cardiovascular: Normal rate and regular rhythm.   Pulmonary/Chest: Effort normal and breath sounds normal. She has no wheezes.  Abdominal: Soft. Bowel sounds are normal. There is no tenderness.  Musculoskeletal: Normal range of motion. She exhibits no edema or deformity.  Neurological: She is alert and oriented to person, place, and time.  Skin: Skin is warm and dry. She is not diaphoretic.  Psychiatric: She has a normal mood and affect.    BP (!) 156/80 (BP Location: Right Arm, Cuff Size: Normal)   Pulse 96   Temp 97.4 F (36.3 C) (Oral)   Resp 16   Ht 5\' 6"  (1.676 m)   Wt 163 lb (73.9 kg)   SpO2 97%   BMI 26.31 kg/m  Wt Readings from Last 3 Encounters:  10/05/16 163 lb (73.9 kg)  10/01/16 166 lb (75.3 kg)  09/27/16 168 lb 6.4 oz (76.4  kg)     Lab Results  Component Value Date   WBC 10.0 05/31/2016   HGB 14.2 05/31/2016   HCT 42.3 05/31/2016   PLT 274.0 05/31/2016   GLUCOSE 115 (H) 05/31/2016  CHOL 212 (H) 09/06/2013   TRIG 69.0 09/06/2013   HDL 56.90 09/06/2013   LDLDIRECT 150.7 09/06/2013   LDLCALC 117 (H) 09/30/2011   ALT 19 05/31/2016   AST 20 05/31/2016   NA 139 05/31/2016   K 3.8 05/31/2016   CL 101 05/31/2016   CREATININE 0.96 05/31/2016   BUN 14 05/31/2016   CO2 28 05/31/2016   TSH 3.79 05/31/2016   HGBA1C 5.6 12/30/2008    Lab Results  Component Value Date   TSH 3.79 05/31/2016   Lab Results  Component Value Date   WBC 10.0 05/31/2016   HGB 14.2 05/31/2016   HCT 42.3 05/31/2016   MCV 91.4 05/31/2016   PLT 274.0 05/31/2016   Lab Results  Component Value Date   NA 139 05/31/2016   K 3.8 05/31/2016   CO2 28 05/31/2016   GLUCOSE 115 (H) 05/31/2016   BUN 14 05/31/2016   CREATININE 0.96 05/31/2016   BILITOT 0.4 05/31/2016   ALKPHOS 27 (L) 05/31/2016   AST 20 05/31/2016   ALT 19 05/31/2016   PROT 7.8 05/31/2016   ALBUMIN 4.4 05/31/2016   CALCIUM 10.2 05/31/2016   ANIONGAP 10 03/05/2015   GFR 59.30 (L) 05/31/2016   Lab Results  Component Value Date   CHOL 212 (H) 09/06/2013   Lab Results  Component Value Date   HDL 56.90 09/06/2013   Lab Results  Component Value Date   LDLCALC 117 (H) 09/30/2011   Lab Results  Component Value Date   TRIG 69.0 09/06/2013   Lab Results  Component Value Date   CHOLHDL 4 09/06/2013   Lab Results  Component Value Date   HGBA1C 5.6 12/30/2008      I acted as a Education administrator for Dr. Charlett Blake.  Guerry Bruin, [place].  Assessment & Plan:   Problem List Items Addressed This Visit    None      I am having Ms. Sollers maintain her zolpidem, calcium carbonate, fish oil-omega-3 fatty acids, Lutein, multivitamin, cholecalciferol, b complex vitamins, B-12, Glucosamine, ammonium lactate, ARIPiprazole, hypromellose, docusate sodium, methocarbamol,  levothyroxine, levothyroxine, predniSONE, benzonatate, levocetirizine, and azithromycin.  No orders of the defined types were placed in this encounter.    Jerene Dilling, CMA

## 2016-10-13 DIAGNOSIS — F319 Bipolar disorder, unspecified: Secondary | ICD-10-CM | POA: Diagnosis not present

## 2016-10-20 NOTE — Assessment & Plan Note (Signed)
On Levothyroxine, continue to monitor 

## 2016-10-20 NOTE — Assessment & Plan Note (Signed)
Referred to dermatology for further management

## 2016-10-20 NOTE — Assessment & Plan Note (Signed)
Good response to Azithromycin, patient feeling well today

## 2016-10-20 NOTE — Assessment & Plan Note (Signed)
prolia given. Encouraged to get adequate exercise, calcium and vitamin d intake

## 2016-10-25 ENCOUNTER — Other Ambulatory Visit: Payer: Self-pay | Admitting: Family Medicine

## 2016-11-17 DIAGNOSIS — L409 Psoriasis, unspecified: Secondary | ICD-10-CM | POA: Diagnosis not present

## 2016-11-17 DIAGNOSIS — Q800 Ichthyosis vulgaris: Secondary | ICD-10-CM | POA: Diagnosis not present

## 2016-11-17 DIAGNOSIS — Z23 Encounter for immunization: Secondary | ICD-10-CM | POA: Diagnosis not present

## 2016-11-22 ENCOUNTER — Other Ambulatory Visit: Payer: Self-pay

## 2016-11-22 DIAGNOSIS — Z1231 Encounter for screening mammogram for malignant neoplasm of breast: Secondary | ICD-10-CM | POA: Diagnosis not present

## 2016-11-22 DIAGNOSIS — R05 Cough: Secondary | ICD-10-CM

## 2016-11-22 DIAGNOSIS — R059 Cough, unspecified: Secondary | ICD-10-CM

## 2016-11-22 LAB — HM MAMMOGRAPHY

## 2016-11-22 MED ORDER — LEVOCETIRIZINE DIHYDROCHLORIDE 5 MG PO TABS: 5.0000 mg | ORAL_TABLET | Freq: Every evening | ORAL | 1 refills | Status: DC

## 2016-11-25 ENCOUNTER — Encounter: Payer: Self-pay | Admitting: Podiatry

## 2016-11-25 ENCOUNTER — Ambulatory Visit (INDEPENDENT_AMBULATORY_CARE_PROVIDER_SITE_OTHER): Payer: Medicare Other | Admitting: Podiatry

## 2016-11-25 VITALS — Ht 66.0 in | Wt 163.0 lb

## 2016-11-25 DIAGNOSIS — M79672 Pain in left foot: Secondary | ICD-10-CM

## 2016-11-25 DIAGNOSIS — B351 Tinea unguium: Secondary | ICD-10-CM | POA: Diagnosis not present

## 2016-11-25 DIAGNOSIS — M79671 Pain in right foot: Secondary | ICD-10-CM

## 2016-11-25 DIAGNOSIS — Q828 Other specified congenital malformations of skin: Secondary | ICD-10-CM | POA: Diagnosis not present

## 2016-11-25 NOTE — Progress Notes (Signed)
Subjective:     Patient ID: [name] [name], female   DOB: 03/03/1933, 83 y.o.   MRN: [id number]  HPI This patients presents to office for preventive foot care services.  She develops callus on the ball of both feet which are very painful when walking.  She has also developed long nails since her last visit.  She presents for preventive foot care services.  Review of Systems     Objective:   Physical Exam GENERAL APPEARANCE: Alert, conversant. Appropriately groomed. No acute distress.  VASCULAR: Pedal pulses palpable at 2/4 DP and PT bilateral.  Capillary refill time is immediate to all digits,  Proximal to distal cooling it warm to warm.  Digital hair growth is present bilateral  NEUROLOGIC: sensation is intact epicritically and protectively to 5.07 monofilament at 5/5 sites bilateral.  Light touch is intact bilateral, vibratory sensation intact bilateral, achilles tendon reflex is intact bilateral.  MUSCULOSKELETAL: acceptable muscle strength, tone and stability bilateral.  Intrinsic muscluature intact bilateral.  Rectus appearance of foot and digits noted bilateral.   DERMATOLOGIC: skin color, texture, and turgor are within normal limits.  No preulcerative lesions or ulcers  are seen, no interdigital maceration noted.  No open lesions present.  . No drainage noted. Callus sub5th metatarsal both feet.      Assessment:     Callus  B/L.  Onychomycosis     Plan:     Debride Nails.  Debride porokeratosis  B/L   Damarcus Reggio DPM      

## 2017-02-03 ENCOUNTER — Ambulatory Visit (INDEPENDENT_AMBULATORY_CARE_PROVIDER_SITE_OTHER): Payer: Medicare Other | Admitting: Podiatry

## 2017-02-03 ENCOUNTER — Encounter: Payer: Self-pay | Admitting: Podiatry

## 2017-02-03 DIAGNOSIS — B351 Tinea unguium: Secondary | ICD-10-CM

## 2017-02-03 DIAGNOSIS — M79672 Pain in left foot: Secondary | ICD-10-CM

## 2017-02-03 DIAGNOSIS — M79671 Pain in right foot: Secondary | ICD-10-CM

## 2017-02-03 DIAGNOSIS — Q828 Other specified congenital malformations of skin: Secondary | ICD-10-CM

## 2017-02-03 NOTE — Progress Notes (Signed)
Subjective:     Patient ID: [name] [name], female   DOB: 1933/08/21, 83 y.o.   MRN: [id number]  HPI This patients presents to office for preventive foot care services.  She develops callus on the ball of both feet which are very painful when walking.  She has also developed long nails since her last visit.  She presents for preventive foot care services.  Review of Systems     Objective:   Physical Exam GENERAL APPEARANCE: Alert, conversant. Appropriately groomed. No acute distress.  VASCULAR: Pedal pulses palpable at 2/4 DP and PT bilateral.  Capillary refill time is immediate to all digits,  Proximal to distal cooling it warm to warm.  Digital hair growth is present bilateral  NEUROLOGIC: sensation is intact epicritically and protectively to 5.07 monofilament at 5/5 sites bilateral.  Light touch is intact bilateral, vibratory sensation intact bilateral, achilles tendon reflex is intact bilateral.  MUSCULOSKELETAL: acceptable muscle strength, tone and stability bilateral.  Intrinsic muscluature intact bilateral.  Rectus appearance of foot and digits noted bilateral.   DERMATOLOGIC: skin color, texture, and turgor are within normal limits.  No preulcerative lesions or ulcers  are seen, no interdigital maceration noted.  No open lesions present.  . No drainage noted. Callus sub5th metatarsal both feet.      Assessment:     Callus  B/L.  Onychomycosis     Plan:     Debride Nails.  Debride porokeratosis  B/L   Gardiner Barefoot DPM

## 2017-02-22 DIAGNOSIS — F319 Bipolar disorder, unspecified: Secondary | ICD-10-CM | POA: Diagnosis not present

## 2017-03-16 DIAGNOSIS — N390 Urinary tract infection, site not specified: Secondary | ICD-10-CM | POA: Diagnosis not present

## 2017-03-22 ENCOUNTER — Ambulatory Visit (INDEPENDENT_AMBULATORY_CARE_PROVIDER_SITE_OTHER): Payer: Medicare Other | Admitting: Internal Medicine

## 2017-03-22 ENCOUNTER — Encounter: Payer: Self-pay | Admitting: Internal Medicine

## 2017-03-22 ENCOUNTER — Other Ambulatory Visit: Payer: Self-pay

## 2017-03-22 ENCOUNTER — Telehealth: Payer: Self-pay

## 2017-03-22 VITALS — BP 128/78 | HR 75 | Temp 97.7°F | Resp 14 | Ht 66.0 in | Wt 159.1 lb

## 2017-03-22 DIAGNOSIS — R17 Unspecified jaundice: Secondary | ICD-10-CM

## 2017-03-22 DIAGNOSIS — R399 Unspecified symptoms and signs involving the genitourinary system: Secondary | ICD-10-CM

## 2017-03-22 MED ORDER — SULFAMETHOXAZOLE-TRIMETHOPRIM 800-160 MG PO TABS: 1.0000 | ORAL_TABLET | Freq: Two times a day (BID) | ORAL | 0 refills | Status: DC

## 2017-03-22 NOTE — Progress Notes (Signed)
Subjective:    Patient ID: [name] [name], female    DOB: Jan 28, 1933, 83 y.o.   MRN: [id number]  DOS:  03/22/2017 Type of visit - description : acute Interval history: Symptoms started approximately 03/14/2017 with dysuria and suprapubic pain along with nausea and subjective fever. 03/16/2017 went to urgent care, provide a small urine sample, Udip + for blood she was told, not enough for urine culture, was prescribed Macrobid.  Here because she's not better. Continue with dysuria, mild nausea   Review of Systems  Denies vomiting, no diarrhea. Appetite is okay but reoports some lower abdominal discomfort , increases with by mouth intake. Occasionally has epigastric pain, takes Aleve. No vaginal discharge or bleeding No difficulty urinating but some frequency. Does not know if she had gross hematuria. No flank pain.  Past Medical History:  Diagnosis Date  . Anemia    h/o fibroids  . Bipolar disorder ([place])   . Depression    bipolar  . Eczema   . Hypothyroidism   . Insomnia   . Osteoarthritis   . Osteoporosis   . Psoriasis     Past Surgical History:  Procedure Laterality Date  . ABDOMINAL HYSTERECTOMY  1982  . APPENDECTOMY  2003  . BREAST LUMPECTOMY  1947   left  . CATARACT EXTRACTION  2010  . EYE SURGERY    . HERNIA REPAIR    . KNEE ARTHROSCOPY  09/07/11   right knee  . NASAL SEPTUM SURGERY  1970's  . TONSILLECTOMY  1970's  . TONSILLECTOMY    . TOTAL KNEE ARTHROPLASTY Right 03/04/2015   Procedure: RIGHT TOTAL KNEE ARTHROPLASTY;  Surgeon: Mcarthur Rossetti, MD;  Location: [place];  Service: Orthopedics;  Laterality: Right;  . VENTRAL HERNIA REPAIR  2005    Social History   Social History  . Marital status: Single    Spouse name: N/A  . Number of children: 1  . Years of education: N/A   Occupational History  . retired Retired   Social History Main Topics  . Smoking status: Former Smoker    Quit date: 09/29/1985  . Smokeless tobacco: Never Used  .  Alcohol use No  . Drug use: No  . Sexual activity: Not Currently    Partners: Male   Other Topics Concern  . Not on file   Social History Narrative   Lives by herself   PT for knee----silver sneakers      Allergies as of 03/22/2017      Reactions   Bupropion Hcl Rash      Medication List       Accurate as of 03/22/17 11:59 PM. Always use your most recent med list.          albuterol 108 (90 Base) MCG/ACT inhaler Commonly known as:  PROVENTIL HFA;VENTOLIN HFA Inhale 1-2 puffs into the lungs every 6 (six) hours as needed for wheezing or shortness of breath (cough, shortness of breath or wheezing.).   AMBIEN 10 MG tablet Generic drug:  zolpidem Take 10 mg by mouth at bedtime.   ammonium lactate 12 % cream Commonly known as:  LAC-HYDRIN Apply topically as needed for dry skin.   ARIPiprazole 5 MG tablet Commonly known as:  ABILIFY Take 1 tablet by mouth daily.   b complex vitamins tablet Take 2 tablets by mouth daily.   B-12 2500 MCG Tabs Take 1 tablet by mouth daily.   calcium carbonate 600 MG Tabs tablet Commonly known as:  OS-CAL Take 600 mg  by mouth 2 (two) times daily with a meal.   cholecalciferol 1000 units tablet Commonly known as:  VITAMIN D Take 2,000 Units by mouth daily.   docusate sodium 100 MG capsule Commonly known as:  COLACE Take 1 capsule (100 mg total) by mouth 2 (two) times daily.   fish oil-omega-3 fatty acids 1000 MG capsule Take 1 g by mouth daily.   Glucosamine 500 MG Tabs Take 1,000 mg by mouth daily.   hypromellose 0.3 % Gel ophthalmic ointment Commonly known as:  GENTEAL Place 1 application into both eyes at bedtime.   levocetirizine 5 MG tablet Commonly known as:  XYZAL Take 1 tablet (5 mg total) by mouth every evening.   levothyroxine 75 MCG tablet Commonly known as:  SYNTHROID, LEVOTHROID TAKE 1 TABLET BY MOUTH DAILY BEFORE BREAKFAST.   Lutein 40 MG Caps Take 40 mg by mouth daily.   methocarbamol 500 MG  tablet Commonly known as:  ROBAXIN Take 1 tablet (500 mg total) by mouth every 6 (six) hours as needed for muscle spasms.   multivitamin per tablet Take 1 tablet by mouth daily.   sulfamethoxazole-trimethoprim 800-160 MG tablet Commonly known as:  BACTRIM DS,SEPTRA DS Take 1 tablet by mouth 2 (two) times daily.          Objective:   Physical Exam BP 128/78 (BP Location: Left Arm, Patient Position: Sitting, Cuff Size: Small)   Pulse 75   Temp 97.7 F (36.5 C) (Oral)   Resp 14   Ht 5\' 6"  (1.676 m)   Wt 159 lb 2 oz (72.2 kg)   SpO2 95%   BMI 25.68 kg/m  General:   Well developed, well nourished . NAD.  HEENT:  Normocephalic . Face symmetric, atraumatic. Conjunctiva: Mild jaundice? Lung: CTA B CV: RRR,no murmur Abdomen:  Not distended, soft, slightly tender mostly at the lower abdomen bilaterally, some on the epigastric area, no mass or rebound  Skin: Not pale. Not jaundice Neurologic:  alert & oriented X3.  Speech normal, gait appropriate for age and unassisted Psych--  Cognition and judgment appear intact.  Cooperative with normal attention span and concentration.  Behavior appropriate. No anxious or depressed appearing.    Assessment & Plan:   83 year old lady with history of bipolar, depression, hypothyroidism, osteoporosis. Presents with the following: UTI symptoms: She was prescribed Macrobid, no symptom improvement, no urine culture was done. Will stop Macrobid, will get a UA urine culture realizing that she has been on antibiotics and results may not be accurate. Start Bactrim DS twice a day for 5 days, push fluids. Definitely call if not improving soon. Will try to get records from the urgent care Question of jaundice on exam, also mild upper abdominal pain and discomfort on exam. Will get a CMP, CBC. Recommend to stop Aleve but she is quite reluctant. RTC 2 weeks to see PCP

## 2017-03-22 NOTE — Telephone Encounter (Signed)
Records received, forwarded to Dr. Larose Kells.

## 2017-03-22 NOTE — Patient Instructions (Addendum)
GO TO THE LAB : Get the blood work     GO TO THE FRONT DESK Schedule your next appointment for a  checkup in 2 weeks with your primary doctor   Stop the antibiotic the urgent care prescribe you  Start taking Bactrim  Drink plenty of fluids  Call anytime if you have fever, chills, if you are not gradually improving.

## 2017-03-22 NOTE — Progress Notes (Signed)
Pre visit review using our clinic review tool, if applicable. No additional management support is needed unless otherwise documented below in the visit note. 

## 2017-03-22 NOTE — Telephone Encounter (Signed)
ROI completed, faxed to [facility] at [phone]. Received fax confirmation, ROI sent for scanning, awaiting records.

## 2017-03-23 LAB — URINALYSIS, ROUTINE W REFLEX MICROSCOPIC
Bilirubin Urine: NEGATIVE
Hgb urine dipstick: NEGATIVE
Leukocytes, UA: NEGATIVE
Nitrite: NEGATIVE
RBC / HPF: NONE SEEN (ref 0–?)
Specific Gravity, Urine: 1.02 (ref 1.000–1.030)
Total Protein, Urine: NEGATIVE
Urine Glucose: NEGATIVE
Urobilinogen, UA: 0.2 (ref 0.0–1.0)
pH: 5.5 (ref 5.0–8.0)

## 2017-03-23 LAB — COMPREHENSIVE METABOLIC PANEL
ALT: 15 U/L (ref 0–35)
AST: 20 U/L (ref 0–37)
Albumin: 4.3 g/dL (ref 3.5–5.2)
Alkaline Phosphatase: 25 U/L — ABNORMAL LOW (ref 39–117)
BUN: 17 mg/dL (ref 6–23)
CO2: 31 mEq/L (ref 19–32)
Calcium: 9.9 mg/dL (ref 8.4–10.5)
Chloride: 102 mEq/L (ref 96–112)
Creatinine, Ser: 0.98 mg/dL (ref 0.40–1.20)
GFR: 57.79 mL/min — ABNORMAL LOW (ref >60.00)
Glucose, Bld: 103 mg/dL — ABNORMAL HIGH (ref 70–99)
Potassium: 3.9 mEq/L (ref 3.5–5.1)
Sodium: 140 mEq/L (ref 135–145)
Total Bilirubin: 0.4 mg/dL (ref 0.2–1.2)
Total Protein: 7.6 g/dL (ref 6.0–8.3)

## 2017-03-23 LAB — CBC WITH DIFFERENTIAL/PLATELET
Basophils Absolute: 0 10*3/uL (ref 0.0–0.1)
Basophils Relative: 0.4 % (ref 0.0–3.0)
Eosinophils Absolute: 0.2 10*3/uL (ref 0.0–0.7)
Eosinophils Relative: 2.2 % (ref 0.0–5.0)
HCT: 41.8 % (ref 36.0–46.0)
Hemoglobin: 13.8 g/dL (ref 12.0–15.0)
Lymphocytes Relative: 30.4 % (ref 12.0–46.0)
Lymphs Abs: 2.4 10*3/uL (ref 0.7–4.0)
MCHC: 32.9 g/dL (ref 30.0–36.0)
MCV: 93.5 fl (ref 78.0–100.0)
Monocytes Absolute: 0.8 10*3/uL (ref 0.1–1.0)
Monocytes Relative: 10.4 % (ref 3.0–12.0)
Neutro Abs: 4.5 10*3/uL (ref 1.4–7.7)
Neutrophils Relative %: 56.6 % (ref 43.0–77.0)
Platelets: 274 10*3/uL (ref 150.0–400.0)
RBC: 4.47 Mil/uL (ref 3.87–5.11)
RDW: 13.9 % (ref 11.5–15.5)
WBC: 8 10*3/uL (ref 4.0–10.5)

## 2017-03-23 LAB — URINE CULTURE

## 2017-03-23 NOTE — Telephone Encounter (Signed)
We only got a Clinical summary, she was indeed prescribed Macrobid for 10 days, dipstick was positive for blood. They were unable to send a urine culture.

## 2017-03-24 ENCOUNTER — Telehealth: Payer: Self-pay | Admitting: Family Medicine

## 2017-03-24 MED ORDER — ONDANSETRON HCL 8 MG PO TABS: 8.0000 mg | ORAL_TABLET | Freq: Three times a day (TID) | ORAL | 0 refills | Status: DC | PRN

## 2017-03-24 NOTE — Addendum Note (Signed)
Addended byDamita Dunnings on: 03/24/2017 03:51 PM   Modules accepted: Orders

## 2017-03-24 NOTE — Telephone Encounter (Signed)
Prolia benefits verified NO PA required Subject to $183 ded-met 20% co-insurance [employer] $350 ded-met  Patient should owe approximately $0 oop  Due after 04/03/17

## 2017-03-25 ENCOUNTER — Ambulatory Visit (INDEPENDENT_AMBULATORY_CARE_PROVIDER_SITE_OTHER): Payer: Medicare Other | Admitting: Family Medicine

## 2017-03-25 ENCOUNTER — Encounter: Payer: Self-pay | Admitting: Family Medicine

## 2017-03-25 VITALS — BP 126/70 | HR 84 | Temp 98.3°F | Resp 16 | Ht 66.0 in | Wt 160.0 lb

## 2017-03-25 DIAGNOSIS — R350 Frequency of micturition: Secondary | ICD-10-CM

## 2017-03-25 DIAGNOSIS — R1013 Epigastric pain: Secondary | ICD-10-CM | POA: Diagnosis not present

## 2017-03-25 LAB — COMPREHENSIVE METABOLIC PANEL
ALT: 15 U/L (ref 0–35)
AST: 20 U/L (ref 0–37)
Albumin: 4.3 g/dL (ref 3.5–5.2)
Alkaline Phosphatase: 25 U/L — ABNORMAL LOW (ref 39–117)
BUN: 16 mg/dL (ref 6–23)
CO2: 25 mEq/L (ref 19–32)
Calcium: 10.3 mg/dL (ref 8.4–10.5)
Chloride: 102 mEq/L (ref 96–112)
Creatinine, Ser: 1.24 mg/dL — ABNORMAL HIGH (ref 0.40–1.20)
GFR: 44.04 mL/min — ABNORMAL LOW (ref >60.00)
Glucose, Bld: 99 mg/dL (ref 70–99)
Potassium: 4.1 mEq/L (ref 3.5–5.1)
Sodium: 136 mEq/L (ref 135–145)
Total Bilirubin: 0.4 mg/dL (ref 0.2–1.2)
Total Protein: 7.6 g/dL (ref 6.0–8.3)

## 2017-03-25 LAB — CBC WITH DIFFERENTIAL/PLATELET
Basophils Absolute: 0.1 10*3/uL (ref 0.0–0.1)
Basophils Relative: 0.8 % (ref 0.0–3.0)
Eosinophils Absolute: 0.1 10*3/uL (ref 0.0–0.7)
Eosinophils Relative: 0.8 % (ref 0.0–5.0)
HCT: 41.4 % (ref 36.0–46.0)
Hemoglobin: 13.7 g/dL (ref 12.0–15.0)
Lymphocytes Relative: 23.5 % (ref 12.0–46.0)
Lymphs Abs: 1.7 10*3/uL (ref 0.7–4.0)
MCHC: 33 g/dL (ref 30.0–36.0)
MCV: 93.4 fl (ref 78.0–100.0)
Monocytes Absolute: 0.7 10*3/uL (ref 0.1–1.0)
Monocytes Relative: 9.9 % (ref 3.0–12.0)
Neutro Abs: 4.6 10*3/uL (ref 1.4–7.7)
Neutrophils Relative %: 65 % (ref 43.0–77.0)
Platelets: 256 10*3/uL (ref 150.0–400.0)
RBC: 4.44 Mil/uL (ref 3.87–5.11)
RDW: 14.2 % (ref 11.5–15.5)
WBC: 7.1 10*3/uL (ref 4.0–10.5)

## 2017-03-25 LAB — H. PYLORI ANTIBODY, IGG: H Pylori IgG: NEGATIVE

## 2017-03-25 LAB — POC URINALSYSI DIPSTICK (AUTOMATED)
Bilirubin, UA: NEGATIVE
Blood, UA: NEGATIVE
Glucose, UA: NEGATIVE
Ketones, UA: NEGATIVE
Leukocytes, UA: NEGATIVE
Nitrite, UA: NEGATIVE
Protein, UA: NEGATIVE
Spec Grav, UA: >=1.03 — AB (ref 1.010–1.025)
Urobilinogen, UA: 0.2 E.U./dL
pH, UA: 6 (ref 5.0–8.0)

## 2017-03-25 MED ORDER — GI COCKTAIL ~~LOC~~
30.0000 mL | Freq: Once | ORAL | Status: AC
  Administered 2017-03-25: 30 mL via ORAL

## 2017-03-25 MED ORDER — RANITIDINE HCL 150 MG PO TABS: 150.0000 mg | ORAL_TABLET | Freq: Two times a day (BID) | ORAL | 5 refills | Status: DC

## 2017-03-25 NOTE — Progress Notes (Signed)
Patient ID: [name] [name], female   DOB: 09-Mar-1933, 83 y.o.   MRN: [id number]     Subjective:  I acted as a Education administrator for Dr. Carollee Herter.  Guerry Bruin, [place]   Patient ID: [name] [name], female    DOB: [birth year], 83 y.o.   MRN: [id number]  Chief Complaint  Patient presents with  . Abdominal Pain    Abdominal Pain  This is a new problem. Episode onset: 5 days. The problem occurs constantly. The pain is located in the epigastric region. The quality of the pain is a sensation of fullness and aching. The abdominal pain radiates to the epigastric region. Associated symptoms include constipation, flatus and nausea. Pertinent negatives include no belching, dysuria, fever, frequency, headaches, hematochezia, melena or vomiting. The pain is aggravated by eating, movement and palpation. She has tried nothing for the symptoms.    Patient is in today for abdominal pain   Patient Care Team: Carollee Herter, Alferd Apa, DO as PCP - General Mcarthur Rossetti, MD as Consulting Physician (Orthopedic Surgery)   Past Medical History:  Diagnosis Date  . Anemia    h/o fibroids  . Bipolar disorder ([place])   . Depression    bipolar  . Eczema   . Hypothyroidism   . Insomnia   . Osteoarthritis   . Osteoporosis   . Psoriasis     Past Surgical History:  Procedure Laterality Date  . ABDOMINAL HYSTERECTOMY  1982  . APPENDECTOMY  2003  . BREAST LUMPECTOMY  1947   left  . CATARACT EXTRACTION  2010  . EYE SURGERY    . HERNIA REPAIR    . KNEE ARTHROSCOPY  09/07/11   right knee  . NASAL SEPTUM SURGERY  1970's  . TONSILLECTOMY  1970's  . TONSILLECTOMY    . TOTAL KNEE ARTHROPLASTY Right 03/04/2015   Procedure: RIGHT TOTAL KNEE ARTHROPLASTY;  Surgeon: Mcarthur Rossetti, MD;  Location: [place];  Service: Orthopedics;  Laterality: Right;  . VENTRAL HERNIA REPAIR  2005    Family History  Problem Relation Age of Onset  . Colon cancer Brother   . Lung cancer Father   . Prostate cancer Father   .  Arthritis Father   . Cancer Father        lung, prostate  . Heart disease Brother   . Cancer Brother        lung  . Lung cancer Brother   . Aortic aneurysm Brother   . Cancer Other        colon    Social History   Social History  . Marital status: Single    Spouse name: N/A  . Number of children: 1  . Years of education: N/A   Occupational History  . retired Retired   Social History Main Topics  . Smoking status: Former Smoker    Quit date: 09/29/1985  . Smokeless tobacco: Never Used  . Alcohol use No  . Drug use: No  . Sexual activity: Not Currently    Partners: Male   Other Topics Concern  . Not on file   Social History Narrative   Lives by herself   PT for knee----silver sneakers    Outpatient Medications Prior to Visit  Medication Sig Dispense Refill  . albuterol (PROVENTIL HFA;VENTOLIN HFA) 108 (90 Base) MCG/ACT inhaler Inhale 1-2 puffs into the lungs every 6 (six) hours as needed for wheezing or shortness of breath (cough, shortness of breath or wheezing.). 1 Inhaler 1  . ammonium lactate (  LAC-HYDRIN) 12 % cream Apply topically as needed for dry skin. 385 g 0  . ARIPiprazole (ABILIFY) 5 MG tablet Take 1 tablet by mouth daily.    Marland Kitchen b complex vitamins tablet Take 2 tablets by mouth daily.    . calcium carbonate (OS-CAL) 600 MG TABS Take 600 mg by mouth 2 (two) times daily with a meal.      . cholecalciferol (VITAMIN D) 1000 UNITS tablet Take 2,000 Units by mouth daily.     . Cyanocobalamin (B-12) 2500 MCG TABS Take 1 tablet by mouth daily.    Marland Kitchen docusate sodium (COLACE) 100 MG capsule Take 1 capsule (100 mg total) by mouth 2 (two) times daily. 10 capsule 0  . fish oil-omega-3 fatty acids 1000 MG capsule Take 1 g by mouth daily.      . Glucosamine 500 MG TABS Take 1,000 mg by mouth daily.     . hypromellose (GENTEAL) 0.3 % GEL ophthalmic ointment Place 1 application into both eyes at bedtime.    Marland Kitchen levocetirizine (XYZAL) 5 MG tablet Take 1 tablet (5 mg total) by  mouth every evening. 90 tablet 1  . levothyroxine (SYNTHROID, LEVOTHROID) 75 MCG tablet TAKE 1 TABLET BY MOUTH DAILY BEFORE BREAKFAST. 90 tablet 0  . Lutein 40 MG CAPS Take 40 mg by mouth daily.     . methocarbamol (ROBAXIN) 500 MG tablet Take 1 tablet (500 mg total) by mouth every 6 (six) hours as needed for muscle spasms. 40 tablet 1  . multivitamin (THERAGRAN) per tablet Take 1 tablet by mouth daily.      . ondansetron (ZOFRAN) 8 MG tablet Take 1 tablet (8 mg total) by mouth 3 (three) times daily as needed for nausea or vomiting. 20 tablet 0  . sulfamethoxazole-trimethoprim (BACTRIM DS,SEPTRA DS) 800-160 MG tablet Take 1 tablet by mouth 2 (two) times daily. 10 tablet 0  . zolpidem (AMBIEN) 10 MG tablet Take 10 mg by mouth at bedtime.      No facility-administered medications prior to visit.     Allergies  Allergen Reactions  . Bupropion Hcl Rash    Review of Systems  Constitutional: Negative for fever and malaise/fatigue.  HENT: Negative for congestion.   Eyes: Negative for blurred vision.  Respiratory: Negative for cough and shortness of breath.   Cardiovascular: Negative for chest pain, palpitations and leg swelling.  Gastrointestinal: Positive for abdominal pain, constipation, flatus and nausea. Negative for hematochezia, melena and vomiting.  Genitourinary: Negative for dysuria and frequency.  Musculoskeletal: Negative for back pain.  Skin: Negative for rash.  Neurological: Negative for loss of consciousness and headaches.       Objective:    Physical Exam  Constitutional: She is oriented to person, place, and time. She appears well-developed and well-nourished.  HENT:  Head: Normocephalic and atraumatic.  Eyes: Conjunctivae and EOM are normal.  Neck: Normal range of motion. Neck supple. No JVD present. Carotid bruit is not present. No thyromegaly present.  Cardiovascular: Normal rate, regular rhythm and normal heart sounds.   No murmur heard. Pulmonary/Chest: Effort  normal and breath sounds normal. No respiratory distress. She has no wheezes. She has no rales. She exhibits no tenderness.  Abdominal: Soft. There is tenderness in the epigastric area.    Musculoskeletal: She exhibits no edema.  Neurological: She is alert and oriented to person, place, and time.  Psychiatric: She has a normal mood and affect.  Nursing note and vitals reviewed.   BP 126/70 (BP Location: Left Arm, Cuff  Size: Normal)   Pulse 84   Temp 98.3 F (36.8 C) (Oral)   Resp 16   Ht 5\' 6"  (1.676 m)   Wt 160 lb (72.6 kg)   SpO2 92%   BMI 25.82 kg/m  Wt Readings from Last 3 Encounters:  03/25/17 160 lb (72.6 kg)  03/22/17 159 lb 2 oz (72.2 kg)  11/25/16 163 lb (73.9 kg)   BP Readings from Last 3 Encounters:  03/25/17 126/70  03/22/17 128/78  10/05/16 (!) 156/80     Immunization History  Administered Date(s) Administered  . Influenza Whole 08/09/2007, 07/08/2010  . Influenza, High Dose Seasonal PF 07/20/2016  . Influenza,inj,Quad PF,36+ Mos 07/02/2013  . Influenza-Unspecified 06/19/2015  . Pneumococcal Conjugate-13 09/06/2013  . Pneumococcal Polysaccharide-23 08/08/2002  . Td 10/18/1998  . Zoster 09/28/2006    Health Maintenance  Topic Date Due  . TETANUS/TDAP  10/18/2008  . INFLUENZA VACCINE  05/18/2017  . MAMMOGRAM  11/22/2017  . DEXA SCAN  Completed  . PNA vac Low Risk Adult  Completed    Lab Results  Component Value Date   WBC 8.0 03/22/2017   HGB 13.8 03/22/2017   HCT 41.8 03/22/2017   PLT 274.0 03/22/2017   GLUCOSE 103 (H) 03/22/2017   CHOL 212 (H) 09/06/2013   TRIG 69.0 09/06/2013   HDL 56.90 09/06/2013   LDLDIRECT 150.7 09/06/2013   LDLCALC 117 (H) 09/30/2011   ALT 15 03/22/2017   AST 20 03/22/2017   NA 140 03/22/2017   K 3.9 03/22/2017   CL 102 03/22/2017   CREATININE 0.98 03/22/2017   BUN 17 03/22/2017   CO2 31 03/22/2017   TSH 3.79 05/31/2016   HGBA1C 5.6 12/30/2008    Lab Results  Component Value Date   TSH 3.79 05/31/2016    Lab Results  Component Value Date   WBC 8.0 03/22/2017   HGB 13.8 03/22/2017   HCT 41.8 03/22/2017   MCV 93.5 03/22/2017   PLT 274.0 03/22/2017   Lab Results  Component Value Date   NA 140 03/22/2017   K 3.9 03/22/2017   CO2 31 03/22/2017   GLUCOSE 103 (H) 03/22/2017   BUN 17 03/22/2017   CREATININE 0.98 03/22/2017   BILITOT 0.4 03/22/2017   ALKPHOS 25 (L) 03/22/2017   AST 20 03/22/2017   ALT 15 03/22/2017   PROT 7.6 03/22/2017   ALBUMIN 4.3 03/22/2017   CALCIUM 9.9 03/22/2017   ANIONGAP 10 03/05/2015   GFR 57.79 (L) 03/22/2017   Lab Results  Component Value Date   CHOL 212 (H) 09/06/2013   Lab Results  Component Value Date   HDL 56.90 09/06/2013   Lab Results  Component Value Date   LDLCALC 117 (H) 09/30/2011   Lab Results  Component Value Date   TRIG 69.0 09/06/2013   Lab Results  Component Value Date   CHOLHDL 4 09/06/2013   Lab Results  Component Value Date   HGBA1C 5.6 12/30/2008         Assessment & Plan:   Problem List Items Addressed This Visit      Unprioritized   Dyspepsia - Primary    Zantac per orders Check labs Consider GI if no improvement + improvement with GI cocktail       Relevant Medications   ranitidine (ZANTAC) 150 MG tablet   Other Relevant Orders   CBC with Differential/Platelet   Comprehensive metabolic panel   H. pylori antibody, IgG    Other Visit Diagnoses    Urinary frequency  Relevant Orders   POCT Urinalysis Dipstick (Automated) (Completed)      uA -- neg   I am having Ms. Markowicz start on ranitidine. I am also having her maintain her zolpidem, calcium carbonate, fish oil-omega-3 fatty acids, Lutein, multivitamin, cholecalciferol, b complex vitamins, B-12, Glucosamine, ammonium lactate, ARIPiprazole, hypromellose, docusate sodium, methocarbamol, albuterol, levothyroxine, levocetirizine, sulfamethoxazole-trimethoprim, and ondansetron.  Meds ordered this encounter  Medications  . ranitidine  (ZANTAC) 150 MG tablet    Sig: Take 1 tablet (150 mg total) by mouth 2 (two) times daily.    Dispense:  60 tablet    Refill:  5     CMA served as scribe during this visit. History, Physical and Plan performed by medical provider. Documentation and orders reviewed and attested to.  Ann Held, DO

## 2017-03-25 NOTE — Assessment & Plan Note (Signed)
Zantac per orders Check labs Consider GI if no improvement + improvement with GI cocktail

## 2017-03-25 NOTE — Telephone Encounter (Signed)
Called left message to call back 

## 2017-03-25 NOTE — Patient Instructions (Signed)
Food Choices for Gastroesophageal Reflux Disease, Adult When you have gastroesophageal reflux disease (GERD), the foods you eat and your eating habits are very important. Choosing the right foods can help ease your discomfort. What guidelines do I need to follow?  Choose fruits, vegetables, whole grains, and low-fat dairy products.  Choose low-fat meat, fish, and poultry.  Limit fats such as oils, salad dressings, butter, nuts, and avocado.  Keep a food diary. This helps you identify foods that cause symptoms.  Avoid foods that cause symptoms. These may be different for everyone.  Eat small meals often instead of 3 large meals a day.  Eat your meals slowly, in a place where you are relaxed.  Limit fried foods.  Cook foods using methods other than frying.  Avoid drinking alcohol.  Avoid drinking large amounts of liquids with your meals.  Avoid bending over or lying down until 2-3 hours after eating. What foods are not recommended? These are some foods and drinks that may make your symptoms worse: Vegetables  Tomatoes. Tomato juice. Tomato and spaghetti sauce. Chili peppers. Onion and garlic. Horseradish. Fruits  Oranges, grapefruit, and lemon (fruit and juice). Meats  High-fat meats, fish, and poultry. This includes hot dogs, ribs, ham, sausage, salami, and bacon. Dairy  Whole milk and chocolate milk. Sour cream. Cream. Butter. Ice cream. Cream cheese. Drinks  Coffee and tea. Bubbly (carbonated) drinks or energy drinks. Condiments  Hot sauce. Barbecue sauce. Sweets/Desserts  Chocolate and cocoa. Donuts. Peppermint and spearmint. Fats and Oils  High-fat foods. This includes French fries and potato chips. Other  Vinegar. Strong spices. This includes black pepper, white pepper, red pepper, cayenne, curry powder, cloves, ginger, and chili powder. The items listed above may not be a complete list of foods and drinks to avoid. Contact your dietitian for more information.    This information is not intended to replace advice given to you by your health care provider. Make sure you discuss any questions you have with your health care provider. Document Released: 04/04/2012 Document Revised: 03/11/2016 Document Reviewed: 08/08/2013 Elsevier Interactive Patient Education  2017 Elsevier Inc.  

## 2017-03-25 NOTE — Addendum Note (Signed)
Addended by: Kem Boroughs on: 03/25/2017 01:43 PM   Modules accepted: Orders

## 2017-03-25 NOTE — Telephone Encounter (Signed)
Actually discovered patient in the office today. Informed her of Prolia info. She scheduled nurse visit appt on 04/01/17 at 2:45 Sent staff msg. To tricia to order shot/left note too

## 2017-03-28 ENCOUNTER — Other Ambulatory Visit: Payer: Self-pay | Admitting: Family Medicine

## 2017-03-28 ENCOUNTER — Encounter: Payer: Self-pay | Admitting: *Deleted

## 2017-03-28 DIAGNOSIS — N289 Disorder of kidney and ureter, unspecified: Secondary | ICD-10-CM

## 2017-03-30 DIAGNOSIS — F3112 Bipolar disorder, current episode manic without psychotic features, moderate: Secondary | ICD-10-CM | POA: Diagnosis not present

## 2017-04-01 ENCOUNTER — Ambulatory Visit (INDEPENDENT_AMBULATORY_CARE_PROVIDER_SITE_OTHER): Payer: Medicare Other

## 2017-04-01 DIAGNOSIS — M810 Age-related osteoporosis without current pathological fracture: Secondary | ICD-10-CM | POA: Diagnosis not present

## 2017-04-01 MED ORDER — DENOSUMAB 60 MG/ML ~~LOC~~ SOLN
60.0000 mg | Freq: Once | SUBCUTANEOUS | Status: AC
  Administered 2017-04-01: 60 mg via SUBCUTANEOUS

## 2017-04-01 NOTE — Progress Notes (Signed)
Pre visit review using our clinic tool,if applicable. No additional management support is needed unless otherwise documented below in the visit note.   Patient in for Prolia Injection. Per order from Dr. Carollee Herter due to patient having   Otseoporodis,Osteorthritis.  Given 60 mg SQ Right arm.  Patient will be notified when next injection due.

## 2017-04-07 ENCOUNTER — Ambulatory Visit (INDEPENDENT_AMBULATORY_CARE_PROVIDER_SITE_OTHER): Payer: Medicare Other | Admitting: Podiatry

## 2017-04-07 DIAGNOSIS — M79672 Pain in left foot: Secondary | ICD-10-CM

## 2017-04-07 DIAGNOSIS — B351 Tinea unguium: Secondary | ICD-10-CM

## 2017-04-07 DIAGNOSIS — M79671 Pain in right foot: Secondary | ICD-10-CM

## 2017-04-07 DIAGNOSIS — Q828 Other specified congenital malformations of skin: Secondary | ICD-10-CM

## 2017-04-07 NOTE — Progress Notes (Signed)
Subjective:     Patient ID: [name] [name], female   DOB: 02-09-1933, 83 y.o.   MRN: [id number]  HPI This patients presents to office for preventive foot care services.  She develops callus on the ball of both feet which are very painful when walking.  She has also developed long nails since her last visit.  She presents for preventive foot care services.  Review of Systems     Objective:   Physical Exam GENERAL APPEARANCE: Alert, conversant. Appropriately groomed. No acute distress.  VASCULAR: Pedal pulses palpable at 2/4 DP and PT bilateral.  Capillary refill time is immediate to all digits,  Proximal to distal cooling it warm to warm.  Digital hair growth is present bilateral  NEUROLOGIC: sensation is intact epicritically and protectively to 5.07 monofilament at 5/5 sites bilateral.  Light touch is intact bilateral, vibratory sensation intact bilateral, achilles tendon reflex is intact bilateral.  MUSCULOSKELETAL: acceptable muscle strength, tone and stability bilateral.  Intrinsic muscluature intact bilateral.  Rectus appearance of foot and digits noted bilateral.   DERMATOLOGIC: skin color, texture, and turgor are within normal limits.  No preulcerative lesions or ulcers  are seen, no interdigital maceration noted.  No open lesions present.  . No drainage noted. Callus sub5th metatarsal both feet.      Assessment:     Callus  B/L.  Onychomycosis     Plan:     Debride Nails.  Debride porokeratosis  B/L .  Anklet dispensed for arch pain right foot.  Gardiner Barefoot DPM

## 2017-04-19 ENCOUNTER — Other Ambulatory Visit: Payer: Medicare Other

## 2017-04-19 ENCOUNTER — Other Ambulatory Visit (INDEPENDENT_AMBULATORY_CARE_PROVIDER_SITE_OTHER): Payer: Medicare Other

## 2017-04-19 DIAGNOSIS — N289 Disorder of kidney and ureter, unspecified: Secondary | ICD-10-CM

## 2017-04-19 LAB — BASIC METABOLIC PANEL
BUN: 14 mg/dL (ref 7–25)
CO2: 24 mmol/L (ref 20–31)
Calcium: 9.3 mg/dL (ref 8.6–10.4)
Chloride: 103 mmol/L (ref 98–110)
Creat: 0.89 mg/dL — ABNORMAL HIGH (ref 0.60–0.88)
Glucose, Bld: 114 mg/dL — ABNORMAL HIGH (ref 65–99)
Potassium: 3.7 mmol/L (ref 3.5–5.3)
Sodium: 141 mmol/L (ref 135–146)

## 2017-04-19 NOTE — Addendum Note (Signed)
Addended by: Caffie Pinto on: 04/19/2017 02:48 PM   Modules accepted: Orders

## 2017-04-27 ENCOUNTER — Other Ambulatory Visit: Payer: Self-pay | Admitting: Family Medicine

## 2017-04-28 ENCOUNTER — Other Ambulatory Visit: Payer: Self-pay | Admitting: Family Medicine

## 2017-04-28 DIAGNOSIS — E119 Type 2 diabetes mellitus without complications: Secondary | ICD-10-CM

## 2017-04-28 DIAGNOSIS — E785 Hyperlipidemia, unspecified: Secondary | ICD-10-CM

## 2017-06-27 DIAGNOSIS — Z23 Encounter for immunization: Secondary | ICD-10-CM | POA: Diagnosis not present

## 2017-07-05 ENCOUNTER — Encounter: Payer: Self-pay | Admitting: Podiatry

## 2017-07-05 ENCOUNTER — Ambulatory Visit (INDEPENDENT_AMBULATORY_CARE_PROVIDER_SITE_OTHER): Payer: Medicare Other | Admitting: Podiatry

## 2017-07-05 DIAGNOSIS — L84 Corns and callosities: Secondary | ICD-10-CM

## 2017-07-05 DIAGNOSIS — B351 Tinea unguium: Secondary | ICD-10-CM | POA: Diagnosis not present

## 2017-07-05 NOTE — Progress Notes (Signed)
Subjective:     Patient ID: [name] [name], female   DOB: 1932/11/18, 83 y.o.   MRN: [id number]  HPI This patients presents to office for preventive foot care services.  She develops callus on the ball of both feet which are very painful when walking.  She has also developed long nails since her last visit.  She presents for preventive foot care services.  Review of Systems     Objective:   Physical Exam GENERAL APPEARANCE: Alert, conversant. Appropriately groomed. No acute distress.  VASCULAR: Pedal pulses palpable at 2/4 DP and PT bilateral.  Capillary refill time is immediate to all digits,  Proximal to distal cooling it warm to warm.  Digital hair growth is present bilateral  NEUROLOGIC: sensation is intact epicritically and protectively to 5.07 monofilament at 5/5 sites bilateral.  Light touch is intact bilateral, vibratory sensation intact bilateral, achilles tendon reflex is intact bilateral.  MUSCULOSKELETAL: acceptable muscle strength, tone and stability bilateral.  Intrinsic muscluature intact bilateral.  Rectus appearance of foot and digits noted bilateral.   DERMATOLOGIC: skin color, texture, and turgor are within normal limits.  No preulcerative lesions or ulcers  are seen, no interdigital maceration noted.  No open lesions present.  . No drainage noted. Callus sub5th metatarsal both feet.      Assessment:     Callus  B/L.  Onychomycosis     Plan:     Debride Nails.  Debride porokeratosis  B/L .  Powerstep insole dispensed.  Gardiner Barefoot DPM

## 2017-07-30 ENCOUNTER — Other Ambulatory Visit: Payer: Self-pay | Admitting: Family Medicine

## 2017-08-12 DIAGNOSIS — H1089 Other conjunctivitis: Secondary | ICD-10-CM | POA: Diagnosis not present

## 2017-08-12 DIAGNOSIS — R030 Elevated blood-pressure reading, without diagnosis of hypertension: Secondary | ICD-10-CM | POA: Diagnosis not present

## 2017-08-25 DIAGNOSIS — R300 Dysuria: Secondary | ICD-10-CM | POA: Diagnosis not present

## 2017-08-25 DIAGNOSIS — R030 Elevated blood-pressure reading, without diagnosis of hypertension: Secondary | ICD-10-CM | POA: Diagnosis not present

## 2017-08-25 DIAGNOSIS — N3001 Acute cystitis with hematuria: Secondary | ICD-10-CM | POA: Diagnosis not present

## 2017-08-29 ENCOUNTER — Ambulatory Visit (INDEPENDENT_AMBULATORY_CARE_PROVIDER_SITE_OTHER): Payer: Medicare Other | Admitting: Family Medicine

## 2017-08-29 ENCOUNTER — Ambulatory Visit (HOSPITAL_BASED_OUTPATIENT_CLINIC_OR_DEPARTMENT_OTHER)
Admission: RE | Admit: 2017-08-29 | Discharge: 2017-08-29 | Disposition: A | Discharge status: Home / Self Care | Payer: Medicare Other | Source: Ambulatory Visit | Attending: Family Medicine | Admitting: Family Medicine

## 2017-08-29 ENCOUNTER — Encounter: Payer: Self-pay | Admitting: Family Medicine

## 2017-08-29 ENCOUNTER — Encounter (HOSPITAL_BASED_OUTPATIENT_CLINIC_OR_DEPARTMENT_OTHER): Payer: Self-pay

## 2017-08-29 VITALS — BP 164/86 | HR 97 | Temp 98.1°F | Resp 16 | Ht 66.0 in | Wt 154.6 lb

## 2017-08-29 DIAGNOSIS — R195 Other fecal abnormalities: Secondary | ICD-10-CM | POA: Diagnosis not present

## 2017-08-29 DIAGNOSIS — R300 Dysuria: Secondary | ICD-10-CM | POA: Diagnosis not present

## 2017-08-29 DIAGNOSIS — N281 Cyst of kidney, acquired: Secondary | ICD-10-CM | POA: Diagnosis not present

## 2017-08-29 DIAGNOSIS — E039 Hypothyroidism, unspecified: Secondary | ICD-10-CM | POA: Diagnosis not present

## 2017-08-29 DIAGNOSIS — I700 Atherosclerosis of aorta: Secondary | ICD-10-CM | POA: Diagnosis not present

## 2017-08-29 DIAGNOSIS — R1032 Left lower quadrant pain: Secondary | ICD-10-CM | POA: Insufficient documentation

## 2017-08-29 DIAGNOSIS — E1065 Type 1 diabetes mellitus with hyperglycemia: Secondary | ICD-10-CM | POA: Diagnosis not present

## 2017-08-29 DIAGNOSIS — R739 Hyperglycemia, unspecified: Secondary | ICD-10-CM | POA: Diagnosis not present

## 2017-08-29 LAB — CBC WITH DIFFERENTIAL/PLATELET
Basophils Absolute: 0.1 10*3/uL (ref 0.0–0.1)
Basophils Relative: 0.8 % (ref 0.0–3.0)
Eosinophils Absolute: 0.1 10*3/uL (ref 0.0–0.7)
Eosinophils Relative: 1.6 % (ref 0.0–5.0)
HCT: 43.8 % (ref 36.0–46.0)
Hemoglobin: 14.3 g/dL (ref 12.0–15.0)
Lymphocytes Relative: 26 % (ref 12.0–46.0)
Lymphs Abs: 1.8 10*3/uL (ref 0.7–4.0)
MCHC: 32.6 g/dL (ref 30.0–36.0)
MCV: 93.8 fl (ref 78.0–100.0)
Monocytes Absolute: 0.6 10*3/uL (ref 0.1–1.0)
Monocytes Relative: 9.3 % (ref 3.0–12.0)
Neutro Abs: 4.2 10*3/uL (ref 1.4–7.7)
Neutrophils Relative %: 62.3 % (ref 43.0–77.0)
Platelets: 289 10*3/uL (ref 150.0–400.0)
RBC: 4.67 Mil/uL (ref 3.87–5.11)
RDW: 14.5 % (ref 11.5–15.5)
WBC: 6.8 10*3/uL (ref 4.0–10.5)

## 2017-08-29 LAB — HEMOGLOBIN A1C: Hgb A1c MFr Bld: 5.4 % (ref 4.6–6.5)

## 2017-08-29 LAB — POC URINALSYSI DIPSTICK (AUTOMATED)
Bilirubin, UA: NEGATIVE
Blood, UA: NEGATIVE
Glucose, UA: NEGATIVE
Ketones, UA: NEGATIVE
Leukocytes, UA: NEGATIVE
Nitrite, UA: NEGATIVE
Protein, UA: NEGATIVE
Spec Grav, UA: 1.015 (ref 1.010–1.025)
Urobilinogen, UA: 0.2 E.U./dL
pH, UA: 6 (ref 5.0–8.0)

## 2017-08-29 LAB — COMPREHENSIVE METABOLIC PANEL
ALT: 14 U/L (ref 0–35)
AST: 21 U/L (ref 0–37)
Albumin: 4.4 g/dL (ref 3.5–5.2)
Alkaline Phosphatase: 26 U/L — ABNORMAL LOW (ref 39–117)
BUN: 14 mg/dL (ref 6–23)
CO2: 28 mEq/L (ref 19–32)
Calcium: 10.6 mg/dL — ABNORMAL HIGH (ref 8.4–10.5)
Chloride: 102 mEq/L (ref 96–112)
Creatinine, Ser: 1.13 mg/dL (ref 0.40–1.20)
GFR: 48.98 mL/min — ABNORMAL LOW (ref >60.00)
Glucose, Bld: 112 mg/dL — ABNORMAL HIGH (ref 70–99)
Potassium: 4.7 mEq/L (ref 3.5–5.1)
Sodium: 140 mEq/L (ref 135–145)
Total Bilirubin: 0.6 mg/dL (ref 0.2–1.2)
Total Protein: 8 g/dL (ref 6.0–8.3)

## 2017-08-29 LAB — TSH: TSH: 3.72 u[IU]/mL (ref 0.35–4.50)

## 2017-08-29 MED ORDER — IOPAMIDOL (ISOVUE-300) INJECTION 61%
100.0000 mL | Freq: Once | INTRAVENOUS | Status: AC | PRN
  Administered 2017-08-29: 100 mL via INTRAVENOUS

## 2017-08-29 NOTE — Patient Instructions (Signed)

## 2017-08-29 NOTE — Progress Notes (Signed)
Patient ID: [name] [name], female   DOB: Dec 28, 1932, 83 y.o.   MRN: [id number]    Subjective:  I acted as a Education administrator for Dr. Carollee Herter.  Guerry Bruin, [place]   Patient ID: [name] [name], female    DOB: 04-27-1933, 83 y.o.   MRN: [id number]  Chief Complaint  Patient presents with  . Abdominal Pain    lower abdominal pain    HPI  Patient is in today for low abdominal pain.  She went the urgent care and they treated her for an UTI and gave her Bactrim.  The pain then subsided but never went away.   Pt is in LLQ and suprapubic area.  No fevers, no cp, no sob.   No NVD.      Patient Care Team: Carollee Herter, Alferd Apa, DO as PCP - General Mcarthur Rossetti, MD as Consulting Physician (Orthopedic Surgery)   Past Medical History:  Diagnosis Date  . Anemia    h/o fibroids  . Bipolar disorder ([place])   . Depression    bipolar  . Eczema   . Hypothyroidism   . Insomnia   . Osteoarthritis   . Osteoporosis   . Psoriasis     Past Surgical History:  Procedure Laterality Date  . ABDOMINAL HYSTERECTOMY  1982  . APPENDECTOMY  2003  . BREAST LUMPECTOMY  1947   left  . CATARACT EXTRACTION  2010  . EYE SURGERY    . HERNIA REPAIR    . KNEE ARTHROSCOPY  09/07/11   right knee  . NASAL SEPTUM SURGERY  1970's  . TONSILLECTOMY  1970's  . TONSILLECTOMY    . VENTRAL HERNIA REPAIR  2005    Family History  Problem Relation Age of Onset  . Colon cancer Brother   . Lung cancer Father   . Prostate cancer Father   . Arthritis Father   . Cancer Father        lung, prostate  . Heart disease Brother   . Cancer Brother        lung  . Lung cancer Brother   . Aortic aneurysm Brother   . Cancer Other        colon    Social History   Socioeconomic History  . Marital status: Single    Spouse name: Not on file  . Number of children: 1  . Years of education: Not on file  . Highest education level: Not on file  Social Needs  . Financial resource strain: Not on file  . Food insecurity -  worry: Not on file  . Food insecurity - inability: Not on file  . Transportation needs - medical: Not on file  . Transportation needs - non-medical: Not on file  Occupational History  . Occupation: retired    Fish farm manager: RETIRED  Tobacco Use  . Smoking status: Former Smoker    Last attempt to quit: 09/29/1985    Years since quitting: 31.9  . Smokeless tobacco: Never Used  Substance and Sexual Activity  . Alcohol use: No  . Drug use: No  . Sexual activity: Not Currently    Partners: Male  Other Topics Concern  . Not on file  Social History Narrative   Lives by herself   PT for knee----silver sneakers    Outpatient Medications Prior to Visit  Medication Sig Dispense Refill  . albuterol (PROVENTIL HFA;VENTOLIN HFA) 108 (90 Base) MCG/ACT inhaler Inhale 1-2 puffs into the lungs every 6 (six) hours as needed for wheezing or  shortness of breath (cough, shortness of breath or wheezing.). 1 Inhaler 1  . ammonium lactate (LAC-HYDRIN) 12 % cream Apply topically as needed for dry skin. 385 g 0  . ARIPiprazole (ABILIFY) 5 MG tablet Take 1 tablet by mouth daily.    Marland Kitchen b complex vitamins tablet Take 2 tablets by mouth daily.    . calcium carbonate (OS-CAL) 600 MG TABS Take 600 mg by mouth 2 (two) times daily with a meal.      . cholecalciferol (VITAMIN D) 1000 UNITS tablet Take 2,000 Units by mouth daily.     . Cyanocobalamin (B-12) 2500 MCG TABS Take 1 tablet by mouth daily.    Marland Kitchen docusate sodium (COLACE) 100 MG capsule Take 1 capsule (100 mg total) by mouth 2 (two) times daily. 10 capsule 0  . fish oil-omega-3 fatty acids 1000 MG capsule Take 1 g by mouth daily.      . Glucosamine 500 MG TABS Take 1,000 mg by mouth daily.     . hypromellose (GENTEAL) 0.3 % GEL ophthalmic ointment Place 1 application into both eyes at bedtime.    Marland Kitchen levocetirizine (XYZAL) 5 MG tablet Take 1 tablet (5 mg total) by mouth every evening. 90 tablet 1  . levothyroxine (SYNTHROID, LEVOTHROID) 75 MCG tablet TAKE 1 TABLET  BY MOUTH EVERY DAY BEFORE BREAKFAST 90 tablet 0  . Lutein 40 MG CAPS Take 40 mg by mouth daily.     . methocarbamol (ROBAXIN) 500 MG tablet Take 1 tablet (500 mg total) by mouth every 6 (six) hours as needed for muscle spasms. 40 tablet 1  . multivitamin (THERAGRAN) per tablet Take 1 tablet by mouth daily.      . ondansetron (ZOFRAN) 8 MG tablet Take 1 tablet (8 mg total) by mouth 3 (three) times daily as needed for nausea or vomiting. 20 tablet 0  . ranitidine (ZANTAC) 150 MG tablet Take 1 tablet (150 mg total) by mouth 2 (two) times daily. 60 tablet 5  . sulfamethoxazole-trimethoprim (BACTRIM DS,SEPTRA DS) 800-160 MG tablet Take 1 tablet by mouth 2 (two) times daily. 10 tablet 0  . zolpidem (AMBIEN) 10 MG tablet Take 10 mg by mouth at bedtime.      No facility-administered medications prior to visit.     Allergies  Allergen Reactions  . Bupropion Hcl Rash    Review of Systems  Constitutional: Negative for fever and malaise/fatigue.  HENT: Negative for congestion.   Eyes: Negative for blurred vision.  Respiratory: Negative for cough and shortness of breath.   Cardiovascular: Negative for chest pain, palpitations and leg swelling.  Gastrointestinal: Positive for abdominal pain. Negative for vomiting.  Genitourinary: Positive for dysuria.  Musculoskeletal: Negative for back pain.  Skin: Negative for rash.  Neurological: Negative for loss of consciousness and headaches.       Objective:    Physical Exam  Constitutional: She is oriented to person, place, and time. She appears well-developed and well-nourished.  HENT:  Head: Normocephalic and atraumatic.  Eyes: Conjunctivae and EOM are normal.  Neck: Normal range of motion. Neck supple. No JVD present. Carotid bruit is not present. No thyromegaly present.  Cardiovascular: Normal rate, regular rhythm and normal heart sounds.  No murmur heard. Pulmonary/Chest: Effort normal and breath sounds normal. No respiratory distress. She has  no wheezes. She has no rales. She exhibits no tenderness.  Abdominal: Soft. She exhibits no distension and no mass. There is tenderness in the suprapubic area and left lower quadrant. There is guarding. There  is no rebound.  Musculoskeletal: She exhibits no edema.  Neurological: She is alert and oriented to person, place, and time.  Psychiatric: She has a normal mood and affect.  Nursing note and vitals reviewed.   BP (!) 164/86 (BP Location: Left Arm, Cuff Size: Normal)   Pulse 97   Temp 98.1 F (36.7 C) (Oral)   Resp 16   Ht 5\' 6"  (1.676 m)   Wt 154 lb 9.6 oz (70.1 kg)   SpO2 95%   BMI 24.95 kg/m  Wt Readings from Last 3 Encounters:  08/29/17 154 lb 9.6 oz (70.1 kg)  03/25/17 160 lb (72.6 kg)  03/22/17 159 lb 2 oz (72.2 kg)   BP Readings from Last 3 Encounters:  08/29/17 (!) 164/86  03/25/17 126/70  03/22/17 128/78     Immunization History  Administered Date(s) Administered  . Influenza Whole 08/09/2007, 07/08/2010  . Influenza, High Dose Seasonal PF 07/20/2016  . Influenza,inj,Quad PF,6+ Mos 07/02/2013  . Influenza-Unspecified 06/19/2015, 06/27/2017  . Pneumococcal Conjugate-13 09/06/2013  . Pneumococcal Polysaccharide-23 08/08/2002  . Td 10/18/1998  . Zoster 09/28/2006    Health Maintenance  Topic Date Due  . FOOT EXAM  07/02/1945  . OPHTHALMOLOGY EXAM  07/02/1945  . URINE MICROALBUMIN  07/02/1945  . TETANUS/TDAP  10/18/2008  . HEMOGLOBIN A1C  07/02/2009  . MAMMOGRAM  11/22/2017  . INFLUENZA VACCINE  Completed  . DEXA SCAN  Completed  . PNA vac Low Risk Adult  Completed    Lab Results  Component Value Date   WBC 6.8 08/29/2017   HGB 14.3 08/29/2017   HCT 43.8 08/29/2017   PLT 289.0 08/29/2017   GLUCOSE 112 (H) 08/29/2017   CHOL 212 (H) 09/06/2013   TRIG 69.0 09/06/2013   HDL 56.90 09/06/2013   LDLDIRECT 150.7 09/06/2013   LDLCALC 117 (H) 09/30/2011   ALT 14 08/29/2017   AST 21 08/29/2017   NA 140 08/29/2017   K 4.7 08/29/2017   CL 102  08/29/2017   CREATININE 1.13 08/29/2017   BUN 14 08/29/2017   CO2 28 08/29/2017   TSH 3.72 08/29/2017   HGBA1C 5.4 08/29/2017    Lab Results  Component Value Date   TSH 3.72 08/29/2017   Lab Results  Component Value Date   WBC 6.8 08/29/2017   HGB 14.3 08/29/2017   HCT 43.8 08/29/2017   MCV 93.8 08/29/2017   PLT 289.0 08/29/2017   Lab Results  Component Value Date   NA 140 08/29/2017   K 4.7 08/29/2017   CO2 28 08/29/2017   GLUCOSE 112 (H) 08/29/2017   BUN 14 08/29/2017   CREATININE 1.13 08/29/2017   BILITOT 0.6 08/29/2017   ALKPHOS 26 (L) 08/29/2017   AST 21 08/29/2017   ALT 14 08/29/2017   PROT 8.0 08/29/2017   ALBUMIN 4.4 08/29/2017   CALCIUM 10.6 (H) 08/29/2017   ANIONGAP 10 03/05/2015   GFR 48.98 (L) 08/29/2017   Lab Results  Component Value Date   CHOL 212 (H) 09/06/2013   Lab Results  Component Value Date   HDL 56.90 09/06/2013   Lab Results  Component Value Date   LDLCALC 117 (H) 09/30/2011   Lab Results  Component Value Date   TRIG 69.0 09/06/2013   Lab Results  Component Value Date   CHOLHDL 4 09/06/2013   Lab Results  Component Value Date   HGBA1C 5.4 08/29/2017         Assessment & Plan:     I am having Zayda A.  Murton maintain her zolpidem, calcium carbonate, fish oil-omega-3 fatty acids, Lutein, multivitamin, cholecalciferol, b complex vitamins, B-12, Glucosamine, ammonium lactate, ARIPiprazole, hypromellose, docusate sodium, methocarbamol, albuterol, levocetirizine, sulfamethoxazole-trimethoprim, ondansetron, ranitidine, and levothyroxine.  No orders of the defined types were placed in this encounter. 1. Dysuria  - POCT Urinalysis Dipstick (Automated)  2. LLQ pain ? Diverticulitis Check labs Check ct abd/pelvis - CBC with Differential/Platelet - Comprehensive metabolic panel  3. Left lower quadrant pain  - CT Abdomen Pelvis W Contrast; Future  4. Hypothyroidism, unspecified type  - TSH     5.  Hyperglycemia  - Hemoglobin A1c   CMA served as scribe during this visit. History, Physical and Plan performed by medical provider. Documentation and orders reviewed and attested to.  Ann Held, DO

## 2017-08-29 NOTE — Assessment & Plan Note (Signed)
?   Diverticulitis Check labs and CT scan If pain worsens -- go to ER

## 2017-08-29 NOTE — Assessment & Plan Note (Signed)
Stable Check labs 

## 2017-08-31 ENCOUNTER — Telehealth: Payer: Self-pay

## 2017-08-31 ENCOUNTER — Telehealth: Payer: Self-pay | Admitting: Family Medicine

## 2017-08-31 NOTE — Telephone Encounter (Signed)
Pt called in to return call for lab results.

## 2017-08-31 NOTE — Telephone Encounter (Signed)
Patient wants to know why she needs to have PTH checked.

## 2017-09-01 NOTE — Telephone Encounter (Signed)
Per her labs--- calcium is elevated

## 2017-09-05 ENCOUNTER — Other Ambulatory Visit: Payer: Self-pay | Admitting: *Deleted

## 2017-09-05 NOTE — Telephone Encounter (Signed)
Patient notified

## 2017-09-05 NOTE — Telephone Encounter (Signed)
Patient informed of Calcium elevation. States she will stop taking the suppliments she has been taking. Patient scheduled appointment for follow up visit regarding cramping in legs and other questions.

## 2017-09-12 ENCOUNTER — Ambulatory Visit: Payer: Medicare Other | Admitting: Family Medicine

## 2017-09-13 DIAGNOSIS — R350 Frequency of micturition: Secondary | ICD-10-CM | POA: Diagnosis not present

## 2017-09-13 DIAGNOSIS — R300 Dysuria: Secondary | ICD-10-CM | POA: Diagnosis not present

## 2017-09-13 DIAGNOSIS — R030 Elevated blood-pressure reading, without diagnosis of hypertension: Secondary | ICD-10-CM | POA: Diagnosis not present

## 2017-09-21 ENCOUNTER — Telehealth: Payer: Self-pay | Admitting: Family Medicine

## 2017-09-21 NOTE — Telephone Encounter (Signed)
[employer]rolia benefits received PA NOT required 20% co-insurance-secondary should cover   Patient may owe approximately $58 OOP  Patient due after 09/28/17   Letter mailed to inform patient of benefits and to schedule

## 2017-10-06 ENCOUNTER — Ambulatory Visit (INDEPENDENT_AMBULATORY_CARE_PROVIDER_SITE_OTHER): Payer: Medicare Other

## 2017-10-06 DIAGNOSIS — M810 Age-related osteoporosis without current pathological fracture: Secondary | ICD-10-CM | POA: Diagnosis not present

## 2017-10-06 MED ORDER — DENOSUMAB 60 MG/ML ~~LOC~~ SOLN
60.0000 mg | Freq: Once | SUBCUTANEOUS | Status: AC
  Administered 2017-10-06 (×2): 60 mg via SUBCUTANEOUS

## 2017-10-06 NOTE — Progress Notes (Signed)
Reviewed  Gabriella Woodhead R Lowne Chase, DO  

## 2017-10-06 NOTE — Progress Notes (Signed)
Pre visit review using our clinic tool,if applicable. No additional management support is needed unless otherwise documented below in the visit note.   Patient in for Prolia injection per order from Dr. Carollee Herter due to patient havinf age-related Osteoporosis.  No complaints voiced this visit.  Given 60 mg SG left arm. Patient tolerated well.   6 month return reminder card given.

## 2017-11-03 DIAGNOSIS — F3112 Bipolar disorder, current episode manic without psychotic features, moderate: Secondary | ICD-10-CM | POA: Diagnosis not present

## 2017-11-23 DIAGNOSIS — Z1231 Encounter for screening mammogram for malignant neoplasm of breast: Secondary | ICD-10-CM | POA: Diagnosis not present

## 2017-11-23 LAB — HM MAMMOGRAPHY

## 2017-12-05 ENCOUNTER — Other Ambulatory Visit: Payer: Self-pay | Admitting: Family Medicine

## 2017-12-07 ENCOUNTER — Encounter: Payer: Self-pay | Admitting: *Deleted

## 2017-12-09 ENCOUNTER — Ambulatory Visit (INDEPENDENT_AMBULATORY_CARE_PROVIDER_SITE_OTHER): Payer: Medicare Other | Admitting: Family Medicine

## 2017-12-09 ENCOUNTER — Encounter: Payer: Self-pay | Admitting: Family Medicine

## 2017-12-09 ENCOUNTER — Ambulatory Visit (HOSPITAL_BASED_OUTPATIENT_CLINIC_OR_DEPARTMENT_OTHER)
Admission: RE | Admit: 2017-12-09 | Discharge: 2017-12-09 | Disposition: A | Discharge status: Home / Self Care | Payer: Medicare Other | Source: Ambulatory Visit | Attending: Family Medicine | Admitting: Family Medicine

## 2017-12-09 VITALS — BP 130/80 | HR 94 | Temp 98.3°F | Resp 16 | Ht 66.0 in | Wt 149.8 lb

## 2017-12-09 DIAGNOSIS — J40 Bronchitis, not specified as acute or chronic: Secondary | ICD-10-CM

## 2017-12-09 DIAGNOSIS — I700 Atherosclerosis of aorta: Secondary | ICD-10-CM | POA: Insufficient documentation

## 2017-12-09 DIAGNOSIS — R05 Cough: Secondary | ICD-10-CM | POA: Insufficient documentation

## 2017-12-09 MED ORDER — PROMETHAZINE-DM 6.25-15 MG/5ML PO SYRP: 5.0000 mL | ORAL_SOLUTION | Freq: Four times a day (QID) | ORAL | 0 refills | Status: DC | PRN

## 2017-12-09 MED ORDER — AZITHROMYCIN 250 MG PO TABS: ORAL_TABLET | ORAL | 0 refills | Status: DC

## 2017-12-09 MED FILL — AZITHROMYCIN 250 MG TABLET: 250 | 5 days supply | Qty: 6 | Fill #0

## 2017-12-09 MED FILL — PROMETHAZINE-DM SYRUP: 6.25-15 | 6 days supply | Qty: 118 | Fill #0

## 2017-12-09 NOTE — Progress Notes (Signed)
Patient ID: [name] [name], female   DOB: Jun 06, 1933, 84 y.o.   MRN: [id number]    Subjective:  I acted as a Education administrator for Dr. Carollee Herter.  Guerry Bruin, [place]   Patient ID: [name] [name], female    DOB: 1933-07-26, 84 y.o.   MRN: [id number]  Chief Complaint  Patient presents with  . Cough  . congestion in chest    Cough  This is a new problem. Episode onset: 12/01/17. The cough is productive of sputum (yellow). Associated symptoms include headaches, a sore throat and wheezing. Pertinent negatives include no chills, ear congestion, ear pain, fever, myalgias, nasal congestion, postnasal drip, rhinorrhea or shortness of breath. Treatments tried: mucinex.    Patient is in today for cough and congestion.  Patient Care Team: Carollee Herter, Alferd Apa, DO as PCP - General Mcarthur Rossetti, MD as Consulting Physician (Orthopedic Surgery)   Past Medical History:  Diagnosis Date  . Anemia    h/o fibroids  . Bipolar disorder ([place])   . Depression    bipolar  . Eczema   . Hypothyroidism   . Insomnia   . Osteoarthritis   . Osteoporosis   . Psoriasis     Past Surgical History:  Procedure Laterality Date  . ABDOMINAL HYSTERECTOMY  1982  . APPENDECTOMY  2003  . BREAST LUMPECTOMY  1947   left  . CATARACT EXTRACTION  2010  . EYE SURGERY    . HERNIA REPAIR    . KNEE ARTHROSCOPY  09/07/11   right knee  . NASAL SEPTUM SURGERY  1970's  . TONSILLECTOMY  1970's  . TONSILLECTOMY    . TOTAL KNEE ARTHROPLASTY Right 03/04/2015   Procedure: RIGHT TOTAL KNEE ARTHROPLASTY;  Surgeon: Mcarthur Rossetti, MD;  Location: [facility];  Service: Orthopedics;  Laterality: Right;  . VENTRAL HERNIA REPAIR  2005    Family History  Problem Relation Age of Onset  . Colon cancer Brother   . Lung cancer Father   . Prostate cancer Father   . Arthritis Father   . Cancer Father        lung, prostate  . Heart disease Brother   . Cancer Brother        lung  . Lung cancer Brother   . Aortic aneurysm Brother     . Cancer Other        colon    Social History   Socioeconomic History  . Marital status: Single    Spouse name: Not on file  . Number of children: 1  . Years of education: Not on file  . Highest education level: Not on file  Social Needs  . Financial resource strain: Not on file  . Food insecurity - worry: Not on file  . Food insecurity - inability: Not on file  . Transportation needs - medical: Not on file  . Transportation needs - non-medical: Not on file  Occupational History  . Occupation: retired    Fish farm manager: RETIRED  Tobacco Use  . Smoking status: Former Smoker    Last attempt to quit: 09/29/1985    Years since quitting: 32.2  . Smokeless tobacco: Never Used  Substance and Sexual Activity  . Alcohol use: No  . Drug use: No  . Sexual activity: Not Currently    Partners: Male  Other Topics Concern  . Not on file  Social History Narrative   Lives by herself   PT for knee----silver sneakers    Outpatient Medications Prior to Visit  Medication  Sig Dispense Refill  . albuterol (PROVENTIL HFA;VENTOLIN HFA) 108 (90 Base) MCG/ACT inhaler Inhale 1-2 puffs into the lungs every 6 (six) hours as needed for wheezing or shortness of breath (cough, shortness of breath or wheezing.). 1 Inhaler 1  . ammonium lactate (LAC-HYDRIN) 12 % cream Apply topically as needed for dry skin. 385 g 0  . ARIPiprazole (ABILIFY) 5 MG tablet Take 1 tablet by mouth daily.    Marland Kitchen b complex vitamins tablet Take 2 tablets by mouth daily.    . calcium carbonate (OS-CAL) 600 MG TABS Take 600 mg by mouth 2 (two) times daily with a meal.      . cholecalciferol (VITAMIN D) 1000 UNITS tablet Take 2,000 Units by mouth daily.     . Cyanocobalamin (B-12) 2500 MCG TABS Take 1 tablet by mouth daily.    Marland Kitchen docusate sodium (COLACE) 100 MG capsule Take 1 capsule (100 mg total) by mouth 2 (two) times daily. 10 capsule 0  . fish oil-omega-3 fatty acids 1000 MG capsule Take 1 g by mouth daily.      . Glucosamine 500 MG  TABS Take 1,000 mg by mouth daily.     . hypromellose (GENTEAL) 0.3 % GEL ophthalmic ointment Place 1 application into both eyes at bedtime.    Marland Kitchen levocetirizine (XYZAL) 5 MG tablet Take 1 tablet (5 mg total) by mouth every evening. 90 tablet 1  . levothyroxine (SYNTHROID, LEVOTHROID) 75 MCG tablet TAKE 1 TABLET BY MOUTH EVERY DAY BEFORE BREAKFAST 90 tablet 0  . Lutein 40 MG CAPS Take 40 mg by mouth daily.     . methocarbamol (ROBAXIN) 500 MG tablet Take 1 tablet (500 mg total) by mouth every 6 (six) hours as needed for muscle spasms. 40 tablet 1  . multivitamin (THERAGRAN) per tablet Take 1 tablet by mouth daily.      . ondansetron (ZOFRAN) 8 MG tablet Take 1 tablet (8 mg total) by mouth 3 (three) times daily as needed for nausea or vomiting. 20 tablet 0  . ranitidine (ZANTAC) 150 MG tablet Take 1 tablet (150 mg total) by mouth 2 (two) times daily. 60 tablet 5  . zolpidem (AMBIEN) 10 MG tablet Take 10 mg by mouth at bedtime.     . sulfamethoxazole-trimethoprim (BACTRIM DS,SEPTRA DS) 800-160 MG tablet Take 1 tablet by mouth 2 (two) times daily. 10 tablet 0   No facility-administered medications prior to visit.     Allergies  Allergen Reactions  . Bupropion Hcl Rash    Review of Systems  Constitutional: Negative for chills and fever.  HENT: Positive for sore throat. Negative for ear pain, postnasal drip and rhinorrhea.   Respiratory: Positive for cough and wheezing. Negative for shortness of breath.   Musculoskeletal: Negative for myalgias.  Neurological: Positive for headaches.       Objective:    Physical Exam  Constitutional: She is oriented to person, place, and time. She appears well-developed and well-nourished.  HENT:  Right Ear: External ear normal.  Left Ear: External ear normal.  + PND + errythema  Eyes: Conjunctivae are normal. Right eye exhibits no discharge. Left eye exhibits no discharge.  Cardiovascular: Normal rate, regular rhythm and normal heart sounds.  No  murmur heard. Pulmonary/Chest: Effort normal. No respiratory distress. She has no wheezes. She has rhonchi. She has no rales. She exhibits no tenderness.  Musculoskeletal: She exhibits no edema.  Lymphadenopathy:    She has cervical adenopathy.  Neurological: She is alert and oriented to person,  place, and time.  Nursing note and vitals reviewed.   BP 130/80   Pulse 94   Temp 98.3 F (36.8 C) (Oral)   Resp 16   Ht 5\' 6"  (1.676 m)   Wt 149 lb 12.8 oz (67.9 kg)   SpO2 98%   BMI 24.18 kg/m  Wt Readings from Last 3 Encounters:  12/09/17 149 lb 12.8 oz (67.9 kg)  08/29/17 154 lb 9.6 oz (70.1 kg)  03/25/17 160 lb (72.6 kg)   BP Readings from Last 3 Encounters:  12/09/17 130/80  08/29/17 (!) 164/86  03/25/17 126/70     Immunization History  Administered Date(s) Administered  . Influenza Whole 08/09/2007, 07/08/2010  . Influenza, High Dose Seasonal PF 07/20/2016  . Influenza,inj,Quad PF,6+ Mos 07/02/2013  . Influenza-Unspecified 06/19/2015, 06/27/2017  . Pneumococcal Conjugate-13 09/06/2013  . Pneumococcal Polysaccharide-23 08/08/2002  . Td 10/18/1998  . Zoster 09/28/2006    Health Maintenance  Topic Date Due  . FOOT EXAM  07/02/1945  . OPHTHALMOLOGY EXAM  07/02/1945  . URINE MICROALBUMIN  07/02/1945  . TETANUS/TDAP  10/18/2008  . HEMOGLOBIN A1C  02/26/2018  . MAMMOGRAM  11/23/2018  . INFLUENZA VACCINE  Completed  . DEXA SCAN  Completed  . PNA vac Low Risk Adult  Completed    Lab Results  Component Value Date   WBC 6.8 08/29/2017   HGB 14.3 08/29/2017   HCT 43.8 08/29/2017   PLT 289.0 08/29/2017   GLUCOSE 112 (H) 08/29/2017   CHOL 212 (H) 09/06/2013   TRIG 69.0 09/06/2013   HDL 56.90 09/06/2013   LDLDIRECT 150.7 09/06/2013   LDLCALC 117 (H) 09/30/2011   ALT 14 08/29/2017   AST 21 08/29/2017   NA 140 08/29/2017   K 4.7 08/29/2017   CL 102 08/29/2017   CREATININE 1.13 08/29/2017   BUN 14 08/29/2017   CO2 28 08/29/2017   TSH 3.72 08/29/2017   HGBA1C 5.4  08/29/2017    Lab Results  Component Value Date   TSH 3.72 08/29/2017   Lab Results  Component Value Date   WBC 6.8 08/29/2017   HGB 14.3 08/29/2017   HCT 43.8 08/29/2017   MCV 93.8 08/29/2017   PLT 289.0 08/29/2017   Lab Results  Component Value Date   NA 140 08/29/2017   K 4.7 08/29/2017   CO2 28 08/29/2017   GLUCOSE 112 (H) 08/29/2017   BUN 14 08/29/2017   CREATININE 1.13 08/29/2017   BILITOT 0.6 08/29/2017   ALKPHOS 26 (L) 08/29/2017   AST 21 08/29/2017   ALT 14 08/29/2017   PROT 8.0 08/29/2017   ALBUMIN 4.4 08/29/2017   CALCIUM 10.6 (H) 08/29/2017   ANIONGAP 10 03/05/2015   GFR 48.98 (L) 08/29/2017   Lab Results  Component Value Date   CHOL 212 (H) 09/06/2013   Lab Results  Component Value Date   HDL 56.90 09/06/2013   Lab Results  Component Value Date   LDLCALC 117 (H) 09/30/2011   Lab Results  Component Value Date   TRIG 69.0 09/06/2013   Lab Results  Component Value Date   CHOLHDL 4 09/06/2013   Lab Results  Component Value Date   HGBA1C 5.4 08/29/2017         Assessment & Plan:   Problem List Items Addressed This Visit    None    Visit Diagnoses    Bronchitis    -  Primary   Relevant Medications   azithromycin (ZITHROMAX Z-PAK) 250 MG tablet   promethazine-dextromethorphan (PROMETHAZINE-DM) 6.25-15  MG/5ML syrup   Other Relevant Orders   DG Chest 2 View    rto if symptoms do not improve or worsen---- or go to ER  I have discontinued Kristina A. Valenti's sulfamethoxazole-trimethoprim. I am also having her start on azithromycin and promethazine-dextromethorphan. Additionally, I am having her maintain her zolpidem, calcium carbonate, fish oil-omega-3 fatty acids, Lutein, multivitamin, cholecalciferol, b complex vitamins, B-12, Glucosamine, ammonium lactate, ARIPiprazole, hypromellose, docusate sodium, methocarbamol, albuterol, levocetirizine, ondansetron, ranitidine, and levothyroxine.  Meds ordered this encounter  Medications  .  azithromycin (ZITHROMAX Z-PAK) 250 MG tablet    Sig: As directed    Dispense:  6 each    Refill:  0  . promethazine-dextromethorphan (PROMETHAZINE-DM) 6.25-15 MG/5ML syrup    Sig: Take 5 mLs by mouth 4 (four) times daily as needed.    Dispense:  118 mL    Refill:  0    CMA served as scribe during this visit. History, Physical and Plan performed by medical provider. Documentation and orders reviewed and attested to.  Ann Held, DO

## 2017-12-09 NOTE — Patient Instructions (Signed)

## 2017-12-21 ENCOUNTER — Other Ambulatory Visit: Payer: Self-pay | Admitting: *Deleted

## 2017-12-21 MED ORDER — ALBUTEROL SULFATE HFA 108 (90 BASE) MCG/ACT IN AERS: 1.0000 | INHALATION_SPRAY | Freq: Four times a day (QID) | RESPIRATORY_TRACT | 1 refills | Status: DC | PRN

## 2018-01-13 ENCOUNTER — Encounter: Payer: Self-pay | Admitting: Podiatry

## 2018-01-13 ENCOUNTER — Ambulatory Visit (INDEPENDENT_AMBULATORY_CARE_PROVIDER_SITE_OTHER): Payer: Medicare Other | Admitting: Podiatry

## 2018-01-13 DIAGNOSIS — B351 Tinea unguium: Secondary | ICD-10-CM

## 2018-01-13 DIAGNOSIS — L84 Corns and callosities: Secondary | ICD-10-CM | POA: Diagnosis not present

## 2018-01-13 DIAGNOSIS — Q828 Other specified congenital malformations of skin: Secondary | ICD-10-CM | POA: Diagnosis not present

## 2018-01-13 NOTE — Progress Notes (Signed)
Subjective:     Patient ID: [name] [name], female   DOB: [birth year], 84 y.o.   MRN: [id number]  HPI This patients presents to office for preventive foot care services.  She develops callus on the ball of both feet which are very painful when walking.  She has also developed long nails since her last visit.  She presents for preventive foot care services. Patient has not been seen for over 6 months.  Review of Systems     Objective:   Physical Exam GENERAL APPEARANCE: Alert, conversant. Appropriately groomed. No acute distress.  VASCULAR: Pedal pulses palpable at 2/4 DP and PT bilateral.  Capillary refill time is immediate to all digits,  Proximal to distal cooling it warm to warm.  Digital hair growth is present bilateral  NEUROLOGIC: sensation is intact epicritically and protectively to 5.07 monofilament at 5/5 sites bilateral.  Light touch is intact bilateral, vibratory sensation intact bilateral, achilles tendon reflex is intact bilateral.  MUSCULOSKELETAL: acceptable muscle strength, tone and stability bilateral.  Intrinsic muscluature intact bilateral.  Rectus appearance of foot and digits noted bilateral.  NAILS  Thick disfigured discolored nails  both feet. DERMATOLOGIC: skin color, texture, and turgor are within normal limits.  No preulcerative lesions or ulcers  are seen, no interdigital maceration noted.  No open lesions present.  . No drainage noted. Callus sub 3,5 left foot.  Callus sub 5 right foot.  Clavi 3rd right.      Assessment:     Callus  B/L.  Onychomycosis     Plan:     Debride Nails.  Debride porokeratosis  B/L . Told to continue wearing insole.  Gardiner Barefoot DPM

## 2018-01-16 DIAGNOSIS — H26492 Other secondary cataract, left eye: Secondary | ICD-10-CM | POA: Diagnosis not present

## 2018-01-16 DIAGNOSIS — H01024 Squamous blepharitis left upper eyelid: Secondary | ICD-10-CM | POA: Diagnosis not present

## 2018-01-16 DIAGNOSIS — S0501XA Injury of conjunctiva and corneal abrasion without foreign body, right eye, initial encounter: Secondary | ICD-10-CM | POA: Diagnosis not present

## 2018-01-16 DIAGNOSIS — H16213 Exposure keratoconjunctivitis, bilateral: Secondary | ICD-10-CM | POA: Diagnosis not present

## 2018-01-16 DIAGNOSIS — H01022 Squamous blepharitis right lower eyelid: Secondary | ICD-10-CM | POA: Diagnosis not present

## 2018-01-16 DIAGNOSIS — H10523 Angular blepharoconjunctivitis, bilateral: Secondary | ICD-10-CM | POA: Diagnosis not present

## 2018-01-16 DIAGNOSIS — H04123 Dry eye syndrome of bilateral lacrimal glands: Secondary | ICD-10-CM | POA: Diagnosis not present

## 2018-01-16 DIAGNOSIS — Z961 Presence of intraocular lens: Secondary | ICD-10-CM | POA: Diagnosis not present

## 2018-01-16 DIAGNOSIS — H01025 Squamous blepharitis left lower eyelid: Secondary | ICD-10-CM | POA: Diagnosis not present

## 2018-01-16 DIAGNOSIS — H01021 Squamous blepharitis right upper eyelid: Secondary | ICD-10-CM | POA: Diagnosis not present

## 2018-02-01 DIAGNOSIS — H16143 Punctate keratitis, bilateral: Secondary | ICD-10-CM | POA: Diagnosis not present

## 2018-02-01 DIAGNOSIS — B0052 Herpesviral keratitis: Secondary | ICD-10-CM | POA: Diagnosis not present

## 2018-02-15 DIAGNOSIS — B0052 Herpesviral keratitis: Secondary | ICD-10-CM | POA: Diagnosis not present

## 2018-02-27 ENCOUNTER — Other Ambulatory Visit: Payer: Self-pay | Admitting: Family Medicine

## 2018-03-17 ENCOUNTER — Telehealth: Payer: Self-pay | Admitting: Family Medicine

## 2018-03-17 NOTE — Telephone Encounter (Signed)
Prolia benefits received PA is required- send to [facility] to initiate [employer]BS $185 deductible met Secondary will cover Admin   Patient may owe approximately $0 OOP  Patient due after 04/04/18  Letter mailed to inform patient of benefits and to schedule

## 2018-03-17 NOTE — Telephone Encounter (Signed)
PA initiated via Covermymeds; KEY: [id number]. Awaiting determination.

## 2018-03-17 NOTE — Telephone Encounter (Signed)
PA approved. Effective 02/15/2018 to 03/17/2019.

## 2018-03-29 DIAGNOSIS — B0052 Herpesviral keratitis: Secondary | ICD-10-CM | POA: Diagnosis not present

## 2018-03-29 DIAGNOSIS — H26492 Other secondary cataract, left eye: Secondary | ICD-10-CM | POA: Diagnosis not present

## 2018-03-29 DIAGNOSIS — Z961 Presence of intraocular lens: Secondary | ICD-10-CM | POA: Diagnosis not present

## 2018-04-07 ENCOUNTER — Ambulatory Visit: Payer: Medicare Other | Admitting: Podiatry

## 2018-04-18 ENCOUNTER — Encounter (INDEPENDENT_AMBULATORY_CARE_PROVIDER_SITE_OTHER): Payer: Self-pay | Admitting: Orthopaedic Surgery

## 2018-04-18 ENCOUNTER — Ambulatory Visit (INDEPENDENT_AMBULATORY_CARE_PROVIDER_SITE_OTHER): Payer: Medicare Other

## 2018-04-18 ENCOUNTER — Ambulatory Visit (INDEPENDENT_AMBULATORY_CARE_PROVIDER_SITE_OTHER): Payer: Medicare Other | Admitting: Orthopaedic Surgery

## 2018-04-18 DIAGNOSIS — M25562 Pain in left knee: Secondary | ICD-10-CM | POA: Diagnosis not present

## 2018-04-18 DIAGNOSIS — M1712 Unilateral primary osteoarthritis, left knee: Secondary | ICD-10-CM

## 2018-04-18 MED ORDER — METHYLPREDNISOLONE ACETATE 40 MG/ML IJ SUSP
40.0000 mg | INTRAMUSCULAR | Status: AC | PRN
  Administered 2018-04-18: 40 mg via INTRA_ARTICULAR

## 2018-04-18 MED ORDER — LIDOCAINE HCL 1 % IJ SOLN
3.0000 mL | INTRAMUSCULAR | Status: AC | PRN
  Administered 2018-04-18: 3 mL

## 2018-04-18 NOTE — Progress Notes (Addendum)
Office Visit Note   Patient: [name] [name]           Date of Birth: 06-20-1933           MRN: [id number] Visit Date: 04/18/2018              Requested by: [address], [place], [address] [place], [place] [address] PCP: Carollee Herter, Alferd Apa, DO   Assessment & Plan: Visit Diagnoses:  1. Acute pain of left knee   2. Primary osteoarthritis of left knee     Plan: She will continue to work on strengthening the knee.  If her pain persists may consider a supplemental injection in the knee.  She will call us if she would like to proceed with supplemental injection.  Brochure is given on Monovisc.  She understands and she will need to call prior to having this injection.  Follow-Up Instructions: Return if symptoms worsen or fail to improve.   Orders:  Orders Placed This Encounter  Procedures  . Large Joint Inj  . XR Knee 1-2 Views Left   No orders of the defined types were placed in this encounter.     Procedures: Large Joint Inj: L knee on 04/18/2018 11:42 AM Indications: pain Details: 22 G 1.5 in needle, anterolateral approach  Arthrogram: No  Medications: 3 mL lidocaine 1 %; 40 mg methylPREDNISolone acetate 40 MG/ML Outcome: tolerated well, no immediate complications Procedure, treatment alternatives, risks and benefits explained, specific risks discussed. Consent was given by the patient. Immediately prior to procedure a time out was called to verify the correct patient, procedure, equipment, support staff and site/side marked as required. Patient was prepped and draped in the usual sterile fashion.       Clinical Data: No additional findings.   Subjective: Chief Complaint  Patient presents with  . Left Knee - Pain    HPI [name] [name] is well-known to Dr. Ninfa Linden service comes in today due to left knee pain for last few weeks.  She has known tricompartmental arthritis of the left knee.  She states she has had no known injury to the knee.  She  is having some pain popping in the knee.  Feels that the knee may give way and cause her fall.  She states even walking in a pool is painful and feels like the knee is going to "pop out".  She is status post right total knee arthroplasty by Dr. Ninfa Linden 3 years ago she has no complaints with the right knee. Review of Systems Denies any fevers, chills, shortness of breath or chest pain  Objective: Vital Signs: There were no vitals taken for this visit.  Physical Exam  Constitutional: She is oriented to person, place, and time. She appears well-developed and well-nourished. No distress.  Pulmonary/Chest: Effort normal.  Neurological: She is alert and oriented to person, place, and time.  Skin: She is not diaphoretic.  Psychiatric: She has a normal mood and affect.    Ortho Exam Left knee no effusion, abnormal warmth or  erythema.  No instability valgus varus stressing.  She has tenderness along medial and lateral joint line.  Passive range of motion shows good range of motion with significant patellofemoral crepitus.   No specialty comments available.  Imaging: Xr Knee 1-2 Views Left  Result Date: 04/18/2018 AP and lateral views left knee: No acute fracture.  Mild to moderate medial joint line narrowing.  Moderate arthritic changes lateral joint line and severe  patellofemoral arthritic changes.  Knee is well located.    PMFS History: Patient Active Problem List   Diagnosis Date Noted  . Left lower quadrant pain 08/29/2017  . Dyspepsia 03/25/2017  . Atherosclerosis of aorta ([place]) 09/27/2016  . COPD with acute bronchitis ([place]) 09/27/2016  . Acute blood loss anemia 03/16/2015  . Osteoarthritis of right knee 03/04/2015  . Status post total right knee replacement 03/04/2015  . Contusion of knee, right 10/23/2014  . IBS 02/23/2010  . HEMORRHOIDS, EXTERNAL 01/01/2010  . HEMATURIA UNSPECIFIED 11/04/2009  . NAUSEA 06/02/2009  . URINARY INCONTINENCE, STRESS, FEMALE 05/09/2009  . Acute  sinusitis 11/25/2008  . UTI 04/23/2008  . COLONIC POLYPS 12/21/2007  . Bipolar disorder ([place]) 12/21/2007  . DEVIATED NASAL SEPTUM 12/21/2007  . HERNIA, VENTRAL 12/21/2007  . OSTEOARTHRITIS 12/21/2007  . Hypothyroidism 09/01/2007  . ECZEMA 09/01/2007  . Psoriasis 09/01/2007  . Osteoporosis 09/01/2007  . Insomnia 09/01/2007   Past Medical History:  Diagnosis Date  . Anemia    h/o fibroids  . Bipolar disorder ([place])   . Depression    bipolar  . Eczema   . Hypothyroidism   . Insomnia   . Osteoarthritis   . Osteoporosis   . Psoriasis     Family History  Problem Relation Age of Onset  . Colon cancer Brother   . Lung cancer Father   . Prostate cancer Father   . Arthritis Father   . Cancer Father        lung, prostate  . Heart disease Brother   . Cancer Brother        lung  . Lung cancer Brother   . Aortic aneurysm Brother   . Cancer Other        colon    Past Surgical History:  Procedure Laterality Date  . ABDOMINAL HYSTERECTOMY  1982  . APPENDECTOMY  2003  . BREAST LUMPECTOMY  1947   left  . CATARACT EXTRACTION  2010  . EYE SURGERY    . HERNIA REPAIR    . KNEE ARTHROSCOPY  09/07/11   right knee  . NASAL SEPTUM SURGERY  1970's  . TONSILLECTOMY  1970's  . TONSILLECTOMY    . TOTAL KNEE ARTHROPLASTY Right 03/04/2015   Procedure: RIGHT TOTAL KNEE ARTHROPLASTY;  Surgeon: Mcarthur Rossetti, MD;  Location: [facility];  Service: Orthopedics;  Laterality: Right;  . VENTRAL HERNIA REPAIR  2005   Social History   Occupational History  . Occupation: retired    Fish farm manager: RETIRED  Tobacco Use  . Smoking status: Former Smoker    Last attempt to quit: 09/29/1985    Years since quitting: 32.5  . Smokeless tobacco: Never Used  Substance and Sexual Activity  . Alcohol use: No  . Drug use: No  . Sexual activity: Not Currently    Partners: Male

## 2018-04-19 ENCOUNTER — Ambulatory Visit (INDEPENDENT_AMBULATORY_CARE_PROVIDER_SITE_OTHER): Payer: Medicare Other | Admitting: Podiatry

## 2018-04-19 ENCOUNTER — Encounter: Payer: Self-pay | Admitting: Podiatry

## 2018-04-19 DIAGNOSIS — B351 Tinea unguium: Secondary | ICD-10-CM | POA: Diagnosis not present

## 2018-04-19 DIAGNOSIS — Q828 Other specified congenital malformations of skin: Secondary | ICD-10-CM | POA: Diagnosis not present

## 2018-04-19 NOTE — Progress Notes (Signed)
Subjective:     Patient ID: [name] [name], female   DOB: [birth year], 84 y.o.   MRN: [id number]  HPI This patients presents to the office for preventative foot care services.  Patient says nails and calluses have both grown long and thick.  She experiences pain walking and wearing her shoes. Review of Systems     Objective:   Physical Exam GENERAL APPEARANCE: Alert, conversant. Appropriately groomed. No acute distress.  VASCULAR: Pedal pulses palpable at 2/4 DP and PT bilateral.  Capillary refill time is immediate to all digits,  Proximal to distal cooling it warm to warm.  Digital hair growth is present bilateral  NEUROLOGIC: sensation is intact epicritically and protectively to 5.07 monofilament at 5/5 sites bilateral.  Light touch is intact bilateral, vibratory sensation intact bilateral, achilles tendon reflex is intact bilateral.  MUSCULOSKELETAL: acceptable muscle strength, tone and stability bilateral.  Intrinsic muscluature intact bilateral.  Rectus appearance of foot and digits noted bilateral.  NAILS  Thick disfigured discolored nails  both feet. DERMATOLOGIC: skin color, texture, and turgor are within normal limits.  No preulcerative lesions or ulcers  are seen, no interdigital maceration noted.  No open lesions present.  . No drainage noted. Callus sub 3,5 left foot.  Callus sub 5 right foot.       Assessment:     Callus  B/L.  Onychomycosis     Plan:     Debride Nails.  Debride porokeratosis  B/L . Told to continue wearing insole.  Gardiner Barefoot DPM

## 2018-05-02 DIAGNOSIS — F319 Bipolar disorder, unspecified: Secondary | ICD-10-CM | POA: Diagnosis not present

## 2018-05-18 ENCOUNTER — Other Ambulatory Visit: Payer: Self-pay

## 2018-05-22 ENCOUNTER — Other Ambulatory Visit: Payer: Self-pay | Admitting: Family Medicine

## 2018-05-22 LAB — BASIC METABOLIC PANEL
BUN: 17 (ref 4–21)
Creatinine: 1.1 (ref 0.5–1.1)
Glucose: 96
Potassium: 3.8 (ref 3.4–5.3)
Sodium: 144 (ref 137–147)

## 2018-05-22 LAB — CBC AND DIFFERENTIAL
HCT: 41 (ref 36–46)
Hemoglobin: 14 (ref 12.0–16.0)
Neutrophils Absolute: 3
Platelets: 283 (ref 150–399)
WBC: 5.7

## 2018-05-22 LAB — TSH
TSH: 5.18 (ref 0.41–5.90)
TSH: 5.18 (ref <=5.90)

## 2018-05-22 LAB — HEPATIC FUNCTION PANEL
ALT: 18 (ref 7–35)
AST: 24 (ref 13–35)
Alkaline Phosphatase: 32 (ref 25–125)
Bilirubin, Total: 0.6

## 2018-05-22 LAB — VITAMIN D 25 HYDROXY (VIT D DEFICIENCY, FRACTURES): Vit D, 25-Hydroxy: 74.8

## 2018-05-22 LAB — LIPID PANEL
Cholesterol: 224 — AB (ref 0–200)
HDL: 66 (ref 35–70)
LDL Cholesterol: 140
Triglycerides: 91 (ref 40–160)

## 2018-05-22 LAB — HEMOGLOBIN A1C: Hgb A1c MFr Bld: 5.4 (ref 4.0–6.0)

## 2018-05-23 ENCOUNTER — Other Ambulatory Visit: Payer: Medicare Other

## 2018-05-23 DIAGNOSIS — Z79899 Other long term (current) drug therapy: Secondary | ICD-10-CM | POA: Diagnosis not present

## 2018-05-23 DIAGNOSIS — E039 Hypothyroidism, unspecified: Secondary | ICD-10-CM | POA: Diagnosis not present

## 2018-05-23 DIAGNOSIS — F3112 Bipolar disorder, current episode manic without psychotic features, moderate: Secondary | ICD-10-CM | POA: Diagnosis not present

## 2018-05-29 ENCOUNTER — Encounter: Payer: Self-pay | Admitting: *Deleted

## 2018-06-05 NOTE — Progress Notes (Addendum)
Subjective:   [name] [name] is a 84 y.o. female who presents for Medicare Annual (Subsequent) preventive examination.  Review of Systems: No ROS.  Medicare Wellness Visit. Additional risk factors are reflected in the social history. Cardiac Risk Factors include: advanced age (>20men, >35 women) Sleep patterns: Takes Ambien for sleep. Gets 8 hrs. Feels rested.  Home Safety/Smoke Alarms: Feels safe in home. Smoke alarms in place.  Living environment; residence and Firearm Safety: Lives in [place]. Lives alone.    Female:   Mammo-utd       Dexa scan- order           Objective:     Vitals: BP (!) 143/83 (BP Location: Left Arm, Patient Position: Sitting, Cuff Size: Normal)   Pulse 83   Ht 5\' 5"  (1.651 m)   Wt 149 lb 3.2 oz (67.7 kg)   SpO2 96%   BMI 24.83 kg/m   Body mass index is 24.83 kg/m.  Advanced Directives 06/06/2018 08/11/2016 03/06/2015 02/28/2015 10/15/2014  Does Patient Have a Medical Advance Directive? Yes Yes Yes Yes No  Type of Paramedic of [place];Living will - - - -  Copy of [place] in Chart? No - copy requested - - - -  Would patient like information on creating a medical advance directive? - - - - No - patient declined information    Tobacco Social History   Tobacco Use  Smoking Status Former Smoker  . Last attempt to quit: 09/29/1985  . Years since quitting: 32.7  Smokeless Tobacco Never Used     Counseling given: Not Answered   Clinical Intake:     Pain : No/denies pain                 Past Medical History:  Diagnosis Date  . Anemia    h/o fibroids  . Bipolar disorder ([place])   . Depression    bipolar  . Eczema   . Hypothyroidism   . Insomnia   . Osteoarthritis   . Osteoporosis   . Psoriasis    Past Surgical History:  Procedure Laterality Date  . ABDOMINAL HYSTERECTOMY  1982  . APPENDECTOMY  2003  . BREAST LUMPECTOMY  1947   left  . CATARACT EXTRACTION  2010  .  EYE SURGERY    . HERNIA REPAIR    . KNEE ARTHROSCOPY  09/07/11   right knee  . NASAL SEPTUM SURGERY  1970's  . TONSILLECTOMY  1970's  . TONSILLECTOMY    . TOTAL KNEE ARTHROPLASTY Right 03/04/2015   Procedure: RIGHT TOTAL KNEE ARTHROPLASTY;  Surgeon: Mcarthur Rossetti, MD;  Location: [facility];  Service: Orthopedics;  Laterality: Right;  . VENTRAL HERNIA REPAIR  2005   Family History  Problem Relation Age of Onset  . Colon cancer Brother   . Lung cancer Father   . Prostate cancer Father   . Arthritis Father   . Cancer Father        lung, prostate  . Heart disease Brother   . Cancer Brother        lung  . Lung cancer Brother   . Aortic aneurysm Brother   . Cancer Other        colon   Social History   Socioeconomic History  . Marital status: Single    Spouse name: Not on file  . Number of children: 1  . Years of education: Not on file  . Highest education level:  Not on file  Occupational History  . Occupation: retired    Fish farm manager: RETIRED  Social Needs  . Financial resource strain: Not on file  . Food insecurity:    Worry: Not on file    Inability: Not on file  . Transportation needs:    Medical: Not on file    Non-medical: Not on file  Tobacco Use  . Smoking status: Former Smoker    Last attempt to quit: 09/29/1985    Years since quitting: 32.7  . Smokeless tobacco: Never Used  Substance and Sexual Activity  . Alcohol use: No  . Drug use: No  . Sexual activity: Not Currently    Partners: Male  Lifestyle  . Physical activity:    Days per week: Not on file    Minutes per session: Not on file  . Stress: Not on file  Relationships  . Social connections:    Talks on phone: Not on file    Gets together: Not on file    Attends religious service: Not on file    Active member of club or organization: Not on file    Attends meetings of clubs or organizations: Not on file    Relationship status: Not on file  Other Topics Concern  . Not on file  Social History  Narrative   Lives by herself   PT for knee----silver sneakers    Outpatient Encounter Medications as of 06/06/2018  Medication Sig  . ammonium lactate (LAC-HYDRIN) 12 % cream Apply topically as needed for dry skin.  . ARIPiprazole (ABILIFY) 5 MG tablet Take 1 tablet by mouth daily.  . calcium carbonate (OS-CAL) 600 MG TABS Take 600 mg by mouth 2 (two) times daily with a meal.    . cholecalciferol (VITAMIN D) 1000 UNITS tablet Take 2,000 Units by mouth daily.   . Cyanocobalamin (B-12) 2500 MCG TABS Take 1 tablet by mouth daily.  Marland Kitchen docusate sodium (COLACE) 100 MG capsule Take 1 capsule (100 mg total) by mouth 2 (two) times daily.  . fish oil-omega-3 fatty acids 1000 MG capsule Take 1 g by mouth daily.    . Glucosamine 500 MG TABS Take 1,000 mg by mouth daily.   Marland Kitchen levothyroxine (SYNTHROID, LEVOTHROID) 88 MCG tablet Take 1 tablet (88 mcg total) by mouth daily.  . Lutein 40 MG CAPS Take 20 mg by mouth daily.   . multivitamin (THERAGRAN) per tablet Take 1 tablet by mouth daily.    . ranitidine (ZANTAC) 150 MG tablet Take 1 tablet (150 mg total) by mouth 2 (two) times daily.  Marland Kitchen albuterol (PROVENTIL HFA;VENTOLIN HFA) 108 (90 Base) MCG/ACT inhaler Inhale 1-2 puffs into the lungs every 6 (six) hours as needed for wheezing or shortness of breath (cough, shortness of breath or wheezing.). (Patient not taking: Reported on 06/06/2018)  . b complex vitamins tablet Take 2 tablets by mouth daily.  . hypromellose (GENTEAL) 0.3 % GEL ophthalmic ointment Place 1 application into both eyes at bedtime.  Marland Kitchen levocetirizine (XYZAL) 5 MG tablet Take 1 tablet (5 mg total) by mouth every evening. (Patient not taking: Reported on 06/06/2018)  . methocarbamol (ROBAXIN) 500 MG tablet Take 1 tablet (500 mg total) by mouth every 6 (six) hours as needed for muscle spasms. (Patient not taking: Reported on 06/06/2018)  . ondansetron (ZOFRAN) 8 MG tablet Take 1 tablet (8 mg total) by mouth 3 (three) times daily as needed for nausea  or vomiting. (Patient not taking: Reported on 06/06/2018)  . zolpidem (AMBIEN) 10  MG tablet Take 10 mg by mouth at bedtime.   . [DISCONTINUED] azithromycin (ZITHROMAX Z-PAK) 250 MG tablet As directed  . [DISCONTINUED] levothyroxine (SYNTHROID, LEVOTHROID) 75 MCG tablet TAKE 1 TABLET BY MOUTH EVERY DAY BEFORE BREAKFAST  . [DISCONTINUED] promethazine-dextromethorphan (PROMETHAZINE-DM) 6.25-15 MG/5ML syrup Take 5 mLs by mouth 4 (four) times daily as needed.   No facility-administered encounter medications on file as of 06/06/2018.     Activities of Daily Living In your present state of health, do you have any difficulty performing the following activities: 06/06/2018  Hearing? N  Vision? N  Difficulty concentrating or making decisions? N  Walking or climbing stairs? N  Dressing or bathing? N  Doing errands, shopping? N  Preparing Food and eating ? N  Using the Toilet? N  In the past six months, have you accidently leaked urine? N  Do you have problems with loss of bowel control? N  Managing your Medications? N  Managing your Finances? N  Housekeeping or managing your Housekeeping? Y  Comment has someone come every 2 weeks.  Some recent data might be hidden    Patient Care Team: Carollee Herter, Alferd Apa, DO as PCP - General Mcarthur Rossetti, MD as Consulting Physician (Orthopedic Surgery)    Assessment:   This is a routine wellness examination for [place]. Physical assessment deferred to PCP.  Exercise Activities and Dietary recommendations Current Exercise Habits: Structured exercise class, Type of exercise: yoga;Other - see comments(water aerobics), Time (Minutes): 60, Frequency (Times/Week): 5, Weekly Exercise (Minutes/Week): 300, Intensity: Moderate Diet (meal preparation, eat out, water intake, caffeinated beverages, dairy products, fruits and vegetables): well balanced Breakfast: banana and yogurt Lunch: hotdog Dinner:  Salad  With chicken from [employer]    . DIET -  INCREASE WATER INTAKE       Fall Risk Fall Risk  06/06/2018 10/05/2016 09/27/2016 06/15/2016 02/10/2015  Falls in the past year? No No No No Yes  Comment - - - Emmi Telephone Survey: data to providers prior to load -  Number falls in past yr: - - - - 1  Comment - - - - 10/15/14  Injury with Fall? - - - - No    Depression Screen PHQ 2/9 Scores 06/06/2018 10/05/2016 09/27/2016 02/10/2015  PHQ - 2 Score 0 0 0 0     Cognitive Function MMSE - Mini Mental State Exam 06/06/2018  Orientation to time 5  Orientation to Place 5  Registration 3  Attention/ Calculation 5  Recall 3  Language- name 2 objects 2  Language- repeat 1  Language- follow 3 step command 3  Language- read & follow direction 1  Write a sentence 1  Copy design 1  Total score 30        Immunization History  Administered Date(s) Administered  . Influenza Whole 08/09/2007, 07/08/2010  . Influenza, High Dose Seasonal PF 07/20/2016  . Influenza,inj,Quad PF,6+ Mos 07/02/2013  . Influenza-Unspecified 06/19/2015, 06/27/2017  . Pneumococcal Conjugate-13 09/06/2013  . Pneumococcal Polysaccharide-23 08/08/2002  . Td 10/18/1998  . Zoster 09/28/2006    Screening Tests Health Maintenance  Topic Date Due  . URINE MICROALBUMIN  07[birth year]02/1945  . INFLUENZA VACCINE  05/18/2018  . TETANUS/TDAP  06/07/2019 (Originally 10/18/2008)  . HEMOGLOBIN A1C  11/22/2018  . MAMMOGRAM  11/23/2018  . DEXA SCAN  Completed  . PNA vac Low Risk Adult  Completed  . FOOT EXAM  Discontinued  . OPHTHALMOLOGY EXAM  Discontinued      Plan:    Please  schedule your next medicare wellness visit with me in 1 yr.  Continue doing brain stimulating activities (puzzles, reading, adult coloring books, staying active) to keep memory sharp.   I have ordered your bone density scan. Please schedule.  Bring a copy of your living will and/or healthcare power of attorney to your next office visit.   I have personally reviewed and noted the following in the  patient's chart:   . Medical and social history . Use of alcohol, tobacco or illicit drugs  . Current medications and supplements . Functional ability and status . Nutritional status . Physical activity . Advanced directives . List of other physicians . Hospitalizations, surgeries, and ER visits in previous 12 months . Vitals . Screenings to include cognitive, depression, and falls . Referrals and appointments  In addition, I have reviewed and discussed with patient certain preventive protocols, quality metrics, and best practice recommendations. A written personalized care plan for preventive services as well as general preventive health recommendations were provided to patient.     Shela Nevin, [place]  06/06/2018

## 2018-06-06 ENCOUNTER — Telehealth: Payer: Self-pay | Admitting: *Deleted

## 2018-06-06 ENCOUNTER — Ambulatory Visit: Payer: Medicare Other | Admitting: *Deleted

## 2018-06-06 ENCOUNTER — Ambulatory Visit (HOSPITAL_BASED_OUTPATIENT_CLINIC_OR_DEPARTMENT_OTHER): Payer: Medicare Other

## 2018-06-06 ENCOUNTER — Encounter: Payer: Self-pay | Admitting: *Deleted

## 2018-06-06 VITALS — BP 143/83 | HR 83 | Ht 65.0 in | Wt 149.2 lb

## 2018-06-06 DIAGNOSIS — Z0000 Encounter for general adult medical examination without abnormal findings: Secondary | ICD-10-CM

## 2018-06-06 DIAGNOSIS — Z780 Asymptomatic menopausal state: Secondary | ICD-10-CM

## 2018-06-06 MED ORDER — LEVOTHYROXINE SODIUM 88 MCG PO TABS: 88.0000 ug | ORAL_TABLET | Freq: Every day | ORAL | 0 refills | Status: DC

## 2018-06-06 NOTE — Telephone Encounter (Signed)
Patient notified of results from [facility] was elevated at 5.180.  Dr. Etter Sjogren increased synthroid to 59mcg.  Rx sent in.

## 2018-06-06 NOTE — Progress Notes (Signed)
Reviewed  [name] [name] R Lowne Chase, DO  

## 2018-06-06 NOTE — Patient Instructions (Addendum)
Please schedule your next medicare wellness visit with me in 1 yr.  Continue doing brain stimulating activities (puzzles, reading, adult coloring books, staying active) to keep memory sharp.   I have ordered your bone density scan. Please schedule.  Bring a copy of your living will and/or healthcare power of attorney to your next office visit.   Ms. [name] [name] , Thank you for taking time to come for your Medicare Wellness Visit. I appreciate your ongoing commitment to your health goals. Please review the following plan we discussed and let me know if I can assist you in the future.   These are the goals we discussed: Goals    . DIET - INCREASE WATER INTAKE       This is a list of the screening recommended for you and due dates:  Health Maintenance  Topic Date Due  . Urine Protein Check  07/02/1943  . Flu Shot  05/18/2018  . Tetanus Vaccine  06/07/2019*  . Hemoglobin A1C  11/22/2018  . Mammogram  11/23/2018  . DEXA scan (bone density measurement)  Completed  . Pneumonia vaccines  Completed  . Complete foot exam   Discontinued  . Eye exam for diabetics  Discontinued  *Topic was postponed. The date shown is not the original due date.    Health Maintenance for Postmenopausal Women Menopause is a normal process in which your reproductive ability comes to an end. This process happens gradually over a span of months to years, usually between the ages of 37 and 20. Menopause is complete when you have missed 12 consecutive menstrual periods. It is important to talk with your health care provider about some of the most common conditions that affect postmenopausal women, such as heart disease, cancer, and bone loss (osteoporosis). Adopting a healthy lifestyle and getting preventive care can help to promote your health and wellness. Those actions can also lower your chances of developing some of these common conditions. What should I know about menopause? During menopause, you may experience a  number of symptoms, such as:  Moderate-to-severe hot flashes.  Night sweats.  Decrease in sex drive.  Mood swings.  Headaches.  Tiredness.  Irritability.  Memory problems.  Insomnia.  Choosing to treat or not to treat menopausal changes is an individual decision that you make with your health care provider. What should I know about hormone replacement therapy and supplements? Hormone therapy products are effective for treating symptoms that are associated with menopause, such as hot flashes and night sweats. Hormone replacement carries certain risks, especially as you become older. If you are thinking about using estrogen or estrogen with progestin treatments, discuss the benefits and risks with your health care provider. What should I know about heart disease and stroke? Heart disease, heart attack, and stroke become more likely as you age. This may be due, in part, to the hormonal changes that your body experiences during menopause. These can affect how your body processes dietary fats, triglycerides, and cholesterol. Heart attack and stroke are both medical emergencies. There are many things that you can do to help prevent heart disease and stroke:  Have your blood pressure checked at least every 1-2 years. High blood pressure causes heart disease and increases the risk of stroke.  If you are 51-48 years old, ask your health care provider if you should take aspirin to prevent a heart attack or a stroke.  Do not use any tobacco products, including cigarettes, chewing tobacco, or electronic cigarettes. If you need help quitting,  ask your health care provider.  It is important to eat a healthy diet and maintain a healthy weight. ? Be sure to include plenty of vegetables, fruits, low-fat dairy products, and lean protein. ? Avoid eating foods that are high in solid fats, added sugars, or salt (sodium).  Get regular exercise. This is one of the most important things that you can do  for your health. ? Try to exercise for at least 150 minutes each week. The type of exercise that you do should increase your heart rate and make you sweat. This is known as moderate-intensity exercise. ? Try to do strengthening exercises at least twice each week. Do these in addition to the moderate-intensity exercise.  Know your numbers.Ask your health care provider to check your cholesterol and your blood glucose. Continue to have your blood tested as directed by your health care provider.  What should I know about cancer screening? There are several types of cancer. Take the following steps to reduce your risk and to catch any cancer development as early as possible. Breast Cancer  Practice breast self-awareness. ? This means understanding how your breasts normally appear and feel. ? It also means doing regular breast self-exams. Let your health care provider know about any changes, no matter how small.  If you are 31 or older, have a clinician do a breast exam (clinical breast exam or CBE) every year. Depending on your age, family history, and medical history, it may be recommended that you also have a yearly breast X-ray (mammogram).  If you have a family history of breast cancer, talk with your health care provider about genetic screening.  If you are at high risk for breast cancer, talk with your health care provider about having an MRI and a mammogram every year.  Breast cancer (BRCA) gene test is recommended for women who have family members with BRCA-related cancers. Results of the assessment will determine the need for genetic counseling and BRCA1 and for BRCA2 testing. BRCA-related cancers include these types: ? Breast. This occurs in males or females. ? Ovarian. ? Tubal. This may also be called fallopian tube cancer. ? Cancer of the abdominal or pelvic lining (peritoneal cancer). ? Prostate. ? Pancreatic.  Cervical, Uterine, and Ovarian Cancer Your health care provider may  recommend that you be screened regularly for cancer of the pelvic organs. These include your ovaries, uterus, and vagina. This screening involves a pelvic exam, which includes checking for microscopic changes to the surface of your cervix (Pap test).  For women ages 21-65, health care providers may recommend a pelvic exam and a Pap test every three years. For women ages 82-65, they may recommend the Pap test and pelvic exam, combined with testing for human papilloma virus (HPV), every five years. Some types of HPV increase your risk of cervical cancer. Testing for HPV may also be done on women of any age who have unclear Pap test results.  Other health care providers may not recommend any screening for nonpregnant women who are considered low risk for pelvic cancer and have no symptoms. Ask your health care provider if a screening pelvic exam is right for you.  If you have had past treatment for cervical cancer or a condition that could lead to cancer, you need Pap tests and screening for cancer for at least 20 years after your treatment. If Pap tests have been discontinued for you, your risk factors (such as having a new sexual partner) need to be reassessed to determine  if you should start having screenings again. Some women have medical problems that increase the chance of getting cervical cancer. In these cases, your health care provider may recommend that you have screening and Pap tests more often.  If you have a family history of uterine cancer or ovarian cancer, talk with your health care provider about genetic screening.  If you have vaginal bleeding after reaching menopause, tell your health care provider.  There are currently no reliable tests available to screen for ovarian cancer.  Lung Cancer Lung cancer screening is recommended for adults 28-65 years old who are at high risk for lung cancer because of a history of smoking. A yearly low-dose CT scan of the lungs is recommended if  you:  Currently smoke.  Have a history of at least 30 pack-years of smoking and you currently smoke or have quit within the past 15 years. A pack-year is smoking an average of one pack of cigarettes per day for one year.  Yearly screening should:  Continue until it has been 15 years since you quit.  Stop if you develop a health problem that would prevent you from having lung cancer treatment.  Colorectal Cancer  This type of cancer can be detected and can often be prevented.  Routine colorectal cancer screening usually begins at age 74 and continues through age 53.  If you have risk factors for colon cancer, your health care provider may recommend that you be screened at an earlier age.  If you have a family history of colorectal cancer, talk with your health care provider about genetic screening.  Your health care provider may also recommend using home test kits to check for hidden blood in your stool.  A small camera at the end of a tube can be used to examine your colon directly (sigmoidoscopy or colonoscopy). This is done to check for the earliest forms of colorectal cancer.  Direct examination of the colon should be repeated every 5-10 years until age 59. However, if early forms of precancerous polyps or small growths are found or if you have a family history or genetic risk for colorectal cancer, you may need to be screened more often.  Skin Cancer  Check your skin from head to toe regularly.  Monitor any moles. Be sure to tell your health care provider: ? About any new moles or changes in moles, especially if there is a change in a mole's shape or color. ? If you have a mole that is larger than the size of a pencil eraser.  If any of your family members has a history of skin cancer, especially at a young age, talk with your health care provider about genetic screening.  Always use sunscreen. Apply sunscreen liberally and repeatedly throughout the day.  Whenever you are  outside, protect yourself by wearing long sleeves, pants, a wide-brimmed hat, and sunglasses.  What should I know about osteoporosis? Osteoporosis is a condition in which bone destruction happens more quickly than new bone creation. After menopause, you may be at an increased risk for osteoporosis. To help prevent osteoporosis or the bone fractures that can happen because of osteoporosis, the following is recommended:  If you are 87-65 years old, get at least 1,000 mg of calcium and at least 600 mg of vitamin D per day.  If you are older than age 64 but younger than age 38, get at least 1,200 mg of calcium and at least 600 mg of vitamin D per day.  If  you are older than age 61, get at least 1,200 mg of calcium and at least 800 mg of vitamin D per day.  Smoking and excessive alcohol intake increase the risk of osteoporosis. Eat foods that are rich in calcium and vitamin D, and do weight-bearing exercises several times each week as directed by your health care provider. What should I know about how menopause affects my mental health? Depression may occur at any age, but it is more common as you become older. Common symptoms of depression include:  Low or sad mood.  Changes in sleep patterns.  Changes in appetite or eating patterns.  Feeling an overall lack of motivation or enjoyment of activities that you previously enjoyed.  Frequent crying spells.  Talk with your health care provider if you think that you are experiencing depression. What should I know about immunizations? It is important that you get and maintain your immunizations. These include:  Tetanus, diphtheria, and pertussis (Tdap) booster vaccine.  Influenza every year before the flu season begins.  Pneumonia vaccine.  Shingles vaccine.  Your health care provider may also recommend other immunizations. This information is not intended to replace advice given to you by your health care provider. Make sure you discuss  any questions you have with your health care provider. Document Released: 11/26/2005 Document Revised: 04/23/2016 Document Reviewed: 07/08/2015 Elsevier Interactive Patient Education  2018 [employer].

## 2018-06-07 ENCOUNTER — Ambulatory Visit (HOSPITAL_BASED_OUTPATIENT_CLINIC_OR_DEPARTMENT_OTHER)
Admission: RE | Admit: 2018-06-07 | Discharge: 2018-06-07 | Disposition: A | Discharge status: Home / Self Care | Payer: Medicare Other | Source: Ambulatory Visit | Attending: Family Medicine | Admitting: Family Medicine

## 2018-06-07 DIAGNOSIS — Z780 Asymptomatic menopausal state: Secondary | ICD-10-CM

## 2018-06-07 DIAGNOSIS — M810 Age-related osteoporosis without current pathological fracture: Secondary | ICD-10-CM | POA: Diagnosis not present

## 2018-06-28 ENCOUNTER — Ambulatory Visit (INDEPENDENT_AMBULATORY_CARE_PROVIDER_SITE_OTHER): Payer: Medicare Other | Admitting: Podiatry

## 2018-06-28 ENCOUNTER — Encounter: Payer: Self-pay | Admitting: Podiatry

## 2018-06-28 DIAGNOSIS — M79676 Pain in unspecified toe(s): Secondary | ICD-10-CM | POA: Diagnosis not present

## 2018-06-28 DIAGNOSIS — B351 Tinea unguium: Secondary | ICD-10-CM

## 2018-06-28 DIAGNOSIS — L84 Corns and callosities: Secondary | ICD-10-CM | POA: Diagnosis not present

## 2018-06-28 DIAGNOSIS — Q828 Other specified congenital malformations of skin: Secondary | ICD-10-CM

## 2018-06-28 NOTE — Progress Notes (Signed)
Subjective:     Patient ID: [name] [name], female   DOB: [birth year], 84 y.o.   MRN: [id number]  HPI This patients presents to the office for preventative foot care services.  Patient says nails and calluses have both grown long and thick.  She experiences pain walking and wearing her shoes. Review of Systems     Objective:   Physical Exam GENERAL APPEARANCE: Alert, conversant. Appropriately groomed. No acute distress.  VASCULAR: Pedal pulses palpable at 2/4 DP and PT bilateral.  Capillary refill time is immediate to all digits,  Proximal to distal cooling it warm to warm.  Digital hair growth is present bilateral  NEUROLOGIC: sensation is intact epicritically and protectively to 5.07 monofilament at 5/5 sites bilateral.  Light touch is intact bilateral, vibratory sensation intact bilateral, achilles tendon reflex is intact bilateral.  MUSCULOSKELETAL: acceptable muscle strength, tone and stability bilateral.  Intrinsic muscluature intact bilateral.  Rectus appearance of foot and digits noted bilateral.  NAILS  Thick disfigured discolored nails  both feet. DERMATOLOGIC: skin color, texture, and turgor are within normal limits.  No preulcerative lesions or ulcers  are seen, no interdigital maceration noted.  No open lesions present.  . No drainage noted. Callus sub 3,5 left foot.  Callus sub 5 right foot.       Assessment:     Callus  B/L.  Onychomycosis     Plan:     Debride Nails.  Debride porokeratosis  B/L .   Gardiner Barefoot DPM

## 2018-07-11 DIAGNOSIS — Z23 Encounter for immunization: Secondary | ICD-10-CM | POA: Diagnosis not present

## 2018-07-19 ENCOUNTER — Encounter: Payer: Self-pay | Admitting: *Deleted

## 2018-08-04 ENCOUNTER — Encounter: Payer: Self-pay | Admitting: Family Medicine

## 2018-08-04 ENCOUNTER — Ambulatory Visit (INDEPENDENT_AMBULATORY_CARE_PROVIDER_SITE_OTHER): Payer: Medicare Other | Admitting: Family Medicine

## 2018-08-04 VITALS — BP 142/86 | HR 94 | Temp 97.9°F | Resp 18 | Wt 147.6 lb

## 2018-08-04 DIAGNOSIS — E039 Hypothyroidism, unspecified: Secondary | ICD-10-CM | POA: Diagnosis not present

## 2018-08-04 DIAGNOSIS — M791 Myalgia, unspecified site: Secondary | ICD-10-CM

## 2018-08-04 LAB — SEDIMENTATION RATE: Sed Rate: 6 mm/hr (ref 0–30)

## 2018-08-04 LAB — COMPREHENSIVE METABOLIC PANEL
ALT: 20 U/L (ref 0–35)
AST: 20 U/L (ref 0–37)
Albumin: 4.3 g/dL (ref 3.5–5.2)
Alkaline Phosphatase: 28 U/L — ABNORMAL LOW (ref 39–117)
BUN: 25 mg/dL — ABNORMAL HIGH (ref 6–23)
CO2: 26 mEq/L (ref 19–32)
Calcium: 9.9 mg/dL (ref 8.4–10.5)
Chloride: 105 mEq/L (ref 96–112)
Creatinine, Ser: 1.21 mg/dL — ABNORMAL HIGH (ref 0.40–1.20)
GFR: 45.16 mL/min — ABNORMAL LOW (ref >60.00)
Glucose, Bld: 99 mg/dL (ref 70–99)
Potassium: 3.6 mEq/L (ref 3.5–5.1)
Sodium: 141 mEq/L (ref 135–145)
Total Bilirubin: 0.5 mg/dL (ref 0.2–1.2)
Total Protein: 7.3 g/dL (ref 6.0–8.3)

## 2018-08-04 LAB — CK: Total CK: 65 U/L (ref 7–177)

## 2018-08-04 LAB — MAGNESIUM: Magnesium: 2 mg/dL (ref 1.5–2.5)

## 2018-08-04 MED ORDER — TIZANIDINE HCL 2 MG PO TABS: 1.0000 mg | ORAL_TABLET | Freq: Every evening | ORAL | 1 refills | Status: DC | PRN

## 2018-08-04 NOTE — Patient Instructions (Signed)
Fluids 60-80 oz daily,   Lidocaine gel over the counter made by the companies [employer] and [employer] twice daily on sore muscles  [place] leg cramp medicine, over the counter for muscle cramps use under tongue as needed Muscle Cramps and Spasms Muscle cramps and spasms are when muscles tighten by themselves. They usually get better within minutes. Muscle cramps are painful. They are usually stronger and last longer than muscle spasms. Muscle spasms may or may not be painful. They can last a few seconds or much longer. Follow these instructions at home:  Drink enough fluid to keep your pee (urine) clear or pale yellow.  Massage, stretch, and relax the muscle.  If directed, apply heat to tight or tense muscles as often as told by your doctor. Use the heat source that your doctor recommends. ? Place a towel between your skin and the heat source. ? Leave the heat on for 20-30 minutes. ? Take off the heat if your skin turns bright red. This is especially important if you are unable to feel pain, heat, or cold. You may have a greater risk of getting burned.  If directed, put ice on the affected area. This may help if you are sore or have pain after a cramp or spasm. ? Put ice in a plastic bag. ? Place a towel between your skin and the bag. ? Leave the ice on for 20 minutes, 2-3 times a day.  Take over-the-counter and prescription medicines only as told by your doctor.  Pay attention to any changes in your symptoms. Contact a doctor if:  Your cramps or spasms get worse or happen more often.  Your cramps or spasms do not get better with time. This information is not intended to replace advice given to you by your health care provider. Make sure you discuss any questions you have with your health care provider. Document Released: 09/16/2008 Document Revised: 11/05/2015 Document Reviewed: 07/08/2015 Elsevier Interactive Patient Education  2018 [employer].

## 2018-08-06 DIAGNOSIS — M791 Myalgia, unspecified site: Secondary | ICD-10-CM | POA: Insufficient documentation

## 2018-08-06 NOTE — Assessment & Plan Note (Signed)
On Levothyroxine, continue to monitor 

## 2018-08-06 NOTE — Assessment & Plan Note (Signed)
Check labs, increase hydration, try [place] leg cramp medicine.  Given the small dose of tizanidine to take 1 mg nightly as it is keeping her up at night.  She has been exercising regularly limiting 3 days a week and going to [place] sneakers 2 days a week and acknowledges she has been lax and hydrating.  She will report worsening symptoms.

## 2018-08-06 NOTE — Progress Notes (Signed)
Subjective:    Patient ID: [name] [name], female    DOB: 1933/01/14, 84 y.o.   MRN: [id number]  No chief complaint on file.   HPI Patient is in today for evaluation of myalgias.  She notes she was awoken by a cramp in her right thigh about 4 days ago and her muscle is stayed sore and has been keeping her up since.  [facility] and going to [employer] a couple of others.  She does not recall a strain or a fall.  The pain does not radiate.  No incontinence.  No fever or acute illness symptoms noted. Denies CP/palp/SOB/HA/congestion/fevers/GI or GU c/o. Taking meds as prescribed  Past Medical History:  Diagnosis Date  . Anemia    h/o fibroids  . Bipolar disorder ([place])   . Depression    bipolar  . Eczema   . Hypothyroidism   . Insomnia   . Osteoarthritis   . Osteoporosis   . Psoriasis     Past Surgical History:  Procedure Laterality Date  . ABDOMINAL HYSTERECTOMY  1982  . APPENDECTOMY  2003  . BREAST LUMPECTOMY  1947   left  . CATARACT EXTRACTION  2010  . EYE SURGERY    . HERNIA REPAIR    . KNEE ARTHROSCOPY  09/07/11   right knee  . NASAL SEPTUM SURGERY  1970's  . TONSILLECTOMY  1970's  . TONSILLECTOMY    . TOTAL KNEE ARTHROPLASTY Right 03/04/2015   Procedure: RIGHT TOTAL KNEE ARTHROPLASTY;  Surgeon: Mcarthur Rossetti, MD;  Location: [facility];  Service: Orthopedics;  Laterality: Right;  . VENTRAL HERNIA REPAIR  2005    Family History  Problem Relation Age of Onset  . Colon cancer Brother   . Lung cancer Father   . Prostate cancer Father   . Arthritis Father   . Cancer Father        lung, prostate  . Heart disease Brother   . Cancer Brother        lung  . Lung cancer Brother   . Aortic aneurysm Brother   . Cancer Other        colon    Social History   Socioeconomic History  . Marital status: Single    Spouse name: Not on file  . Number of children: 1  . Years of education: Not on file  .  Highest education level: Not on file  Occupational History  . Occupation: retired    Fish farm manager: RETIRED  Social Needs  . Financial resource strain: Not on file  . Food insecurity:    Worry: Not on file    Inability: Not on file  . Transportation needs:    Medical: Not on file    Non-medical: Not on file  Tobacco Use  . Smoking status: Former Smoker    Last attempt to quit: 09/29/1985    Years since quitting: 32.8  . Smokeless tobacco: Never Used  Substance and Sexual Activity  . Alcohol use: No  . Drug use: No  . Sexual activity: Not Currently    Partners: Male  Lifestyle  . Physical activity:    Days per week: Not on file    Minutes per session: Not on file  . Stress: Not on file  Relationships  . Social connections:    Talks on phone: Not on file    Gets together: Not on file    Attends religious service:  Not on file    Active member of club or organization: Not on file    Attends meetings of clubs or organizations: Not on file    Relationship status: Not on file  . Intimate partner violence:    Fear of current or ex partner: Not on file    Emotionally abused: Not on file    Physically abused: Not on file    Forced sexual activity: Not on file  Other Topics Concern  . Not on file  Social History Narrative   Lives by herself   PT for knee----[employer]    Outpatient Medications Prior to Visit  Medication Sig Dispense Refill  . ammonium lactate (LAC-HYDRIN) 12 % cream Apply topically as needed for dry skin. 385 g 0  . ARIPiprazole (ABILIFY) 5 MG tablet Take 1 tablet by mouth daily.    Marland Kitchen b complex vitamins tablet Take 2 tablets by mouth daily.    . calcium carbonate (OS-CAL) 600 MG TABS Take 600 mg by mouth 2 (two) times daily with a meal.      . cholecalciferol (VITAMIN D) 1000 UNITS tablet Take 3,000 Units by mouth daily.     . Cyanocobalamin (B-12) 2500 MCG TABS Take 1 tablet by mouth daily.    Marland Kitchen docusate sodium (COLACE) 100 MG capsule Take 1 capsule (100  mg total) by mouth 2 (two) times daily. 10 capsule 0  . fish oil-omega-3 fatty acids 1000 MG capsule Take 1 g by mouth daily.      . Glucosamine 500 MG TABS Take 1,000 mg by mouth daily.     . hypromellose (GENTEAL) 0.3 % GEL ophthalmic ointment Place 1 application into both eyes at bedtime.    Marland Kitchen levocetirizine (XYZAL) 5 MG tablet Take 1 tablet (5 mg total) by mouth every evening. 90 tablet 1  . levothyroxine (SYNTHROID, LEVOTHROID) 88 MCG tablet Take 1 tablet (88 mcg total) by mouth daily. 90 tablet 0  . Lutein 40 MG CAPS Take 20 mg by mouth daily.     . methocarbamol (ROBAXIN) 500 MG tablet Take 1 tablet (500 mg total) by mouth every 6 (six) hours as needed for muscle spasms. 40 tablet 1  . multivitamin (THERAGRAN) per tablet Take 1 tablet by mouth daily.      . ondansetron (ZOFRAN) 8 MG tablet Take 1 tablet (8 mg total) by mouth 3 (three) times daily as needed for nausea or vomiting. 20 tablet 0  . ranitidine (ZANTAC) 150 MG tablet Take 1 tablet (150 mg total) by mouth 2 (two) times daily. 60 tablet 5  . zolpidem (AMBIEN) 10 MG tablet Take 10 mg by mouth at bedtime.     Marland Kitchen albuterol (PROVENTIL HFA;VENTOLIN HFA) 108 (90 Base) MCG/ACT inhaler Inhale 1-2 puffs into the lungs every 6 (six) hours as needed for wheezing or shortness of breath (cough, shortness of breath or wheezing.). (Patient not taking: Reported on 08/04/2018) 1 Inhaler 1   No facility-administered medications prior to visit.     Allergies  Allergen Reactions  . Bupropion Hcl Rash    Review of Systems  Constitutional: Positive for malaise/fatigue. Negative for fever.  HENT: Negative for congestion.   Eyes: Negative for blurred vision.  Respiratory: Negative for shortness of breath.   Cardiovascular: Negative for chest pain, palpitations and leg swelling.  Gastrointestinal: Negative for abdominal pain, blood in stool and nausea.  Genitourinary: Negative for dysuria and frequency.  Musculoskeletal: Positive for myalgias.  Negative for falls.  Skin: Negative for rash.  Neurological: Negative for dizziness, loss of consciousness and headaches.  Endo/Heme/Allergies: Negative for environmental allergies.  Psychiatric/Behavioral: Negative for depression. The patient is nervous/anxious.        Objective:    Physical Exam  Constitutional: She is oriented to person, place, and time. She appears well-developed and well-nourished. No distress.  HENT:  Head: Normocephalic and atraumatic.  Nose: Nose normal.  Eyes: Right eye exhibits no discharge. Left eye exhibits no discharge.  Neck: Normal range of motion. Neck supple.  Cardiovascular: Normal rate and regular rhythm.  No murmur heard. Pulmonary/Chest: Effort normal and breath sounds normal.  Abdominal: Soft. Bowel sounds are normal. There is no tenderness.  Musculoskeletal: She exhibits no edema.  Neurological: She is alert and oriented to person, place, and time.  Skin: Skin is warm and dry.  Psychiatric: She has a normal mood and affect.  Nursing note and vitals reviewed.   BP (!) 142/86 (BP Location: Left Arm, Patient Position: Sitting, Cuff Size: Normal)   Pulse 94   Temp 97.9 F (36.6 C) (Oral)   Resp 18   Wt 147 lb 9.6 oz (67 kg)   SpO2 98%   BMI 24.56 kg/m  Wt Readings from Last 3 Encounters:  08/04/18 147 lb 9.6 oz (67 kg)  06/06/18 149 lb 3.2 oz (67.7 kg)  12/09/17 149 lb 12.8 oz (67.9 kg)     Lab Results  Component Value Date   WBC 5.7 05/22/2018   HGB 14.0 05/22/2018   HCT 41 05/22/2018   PLT 283 05/22/2018   GLUCOSE 99 08/04/2018   CHOL 224 (A) 05/22/2018   TRIG 91 05/22/2018   HDL 66 05/22/2018   LDLDIRECT 150.7 09/06/2013   LDLCALC 140 05/22/2018   ALT 20 08/04/2018   AST 20 08/04/2018   NA 141 08/04/2018   K 3.6 08/04/2018   CL 105 08/04/2018   CREATININE 1.21 (H) 08/04/2018   BUN 25 (H) 08/04/2018   CO2 26 08/04/2018   TSH 5.18 05/22/2018   TSH 5.18 05/22/2018   HGBA1C 5.4 05/22/2018    Lab Results    Component Value Date   TSH 5.18 05/22/2018   TSH 5.18 05/22/2018   Lab Results  Component Value Date   WBC 5.7 05/22/2018   HGB 14.0 05/22/2018   HCT 41 05/22/2018   MCV 93.8 08/29/2017   PLT 283 05/22/2018   Lab Results  Component Value Date   NA 141 08/04/2018   K 3.6 08/04/2018   CO2 26 08/04/2018   GLUCOSE 99 08/04/2018   BUN 25 (H) 08/04/2018   CREATININE 1.21 (H) 08/04/2018   BILITOT 0.5 08/04/2018   ALKPHOS 28 (L) 08/04/2018   AST 20 08/04/2018   ALT 20 08/04/2018   PROT 7.3 08/04/2018   ALBUMIN 4.3 08/04/2018   CALCIUM 9.9 08/04/2018   ANIONGAP 10 03/05/2015   GFR 45.16 (L) 08/04/2018   Lab Results  Component Value Date   CHOL 224 (A) 05/22/2018   Lab Results  Component Value Date   HDL 66 05/22/2018   Lab Results  Component Value Date   LDLCALC 140 05/22/2018   Lab Results  Component Value Date   TRIG 91 05/22/2018   Lab Results  Component Value Date   CHOLHDL 4 09/06/2013   Lab Results  Component Value Date   HGBA1C 5.4 05/22/2018       Assessment & Plan:   Problem List Items Addressed This Visit    Hypothyroidism    On Levothyroxine, continue to  monitor      Myalgia - Primary    Check labs, increase hydration, try [place] leg cramp medicine.  Given the small dose of tizanidine to take 1 mg nightly as it is keeping her up at night.  She has been exercising regularly limiting 3 days a week and going to [employer] 2 days a week and acknowledges she has been lax and hydrating.  She will report worsening symptoms.      Relevant Orders   Magnesium (Completed)   Comprehensive metabolic panel (Completed)   Sedimentation rate (Completed)   CK (Creatine Kinase) (Completed)      I am having Jamin A. Edgecombe start on tiZANidine. I am also having her maintain her zolpidem, calcium carbonate, fish oil-omega-3 fatty acids, Lutein, multivitamin, cholecalciferol, b complex vitamins, B-12, Glucosamine, ammonium lactate, ARIPiprazole,  hypromellose, docusate sodium, methocarbamol, levocetirizine, ondansetron, ranitidine, albuterol, and levothyroxine.  Meds ordered this encounter  Medications  . tiZANidine (ZANAFLEX) 2 MG tablet    Sig: Take 0.5-1 tablets (1-2 mg total) by mouth at bedtime as needed for muscle spasms.    Dispense:  30 tablet    Refill:  1     Penni Homans, MD

## 2018-08-14 ENCOUNTER — Other Ambulatory Visit: Payer: Self-pay | Admitting: Family Medicine

## 2018-08-24 ENCOUNTER — Other Ambulatory Visit: Payer: Self-pay | Admitting: *Deleted

## 2018-08-24 MED ORDER — LEVOTHYROXINE SODIUM 88 MCG PO TABS: 88.0000 ug | ORAL_TABLET | Freq: Every day | ORAL | 1 refills | Status: DC

## 2018-08-26 ENCOUNTER — Other Ambulatory Visit: Payer: Self-pay | Admitting: Family Medicine

## 2018-09-05 ENCOUNTER — Encounter: Payer: Self-pay | Admitting: Family Medicine

## 2018-09-05 ENCOUNTER — Ambulatory Visit (INDEPENDENT_AMBULATORY_CARE_PROVIDER_SITE_OTHER): Payer: Medicare Other | Admitting: Family Medicine

## 2018-09-05 DIAGNOSIS — E039 Hypothyroidism, unspecified: Secondary | ICD-10-CM

## 2018-09-05 DIAGNOSIS — R1013 Epigastric pain: Secondary | ICD-10-CM | POA: Diagnosis not present

## 2018-09-05 DIAGNOSIS — M1990 Unspecified osteoarthritis, unspecified site: Secondary | ICD-10-CM

## 2018-09-05 DIAGNOSIS — L409 Psoriasis, unspecified: Secondary | ICD-10-CM

## 2018-09-05 DIAGNOSIS — Z23 Encounter for immunization: Secondary | ICD-10-CM

## 2018-09-05 NOTE — Patient Instructions (Signed)
Shingrix is the new shingles shot 2 shots over 2-6 months at the pharmacy Psoriasis Psoriasis is a long-term (chronic) condition of skin inflammation. It occurs because your immune system causes skin cells to form too quickly. As a result, too many skin cells grow and create raised, red patches (plaques) that look silvery on your skin. Plaques may appear anywhere on your body. They can be any size or shape. Psoriasis can come and go. The condition varies from mild to very severe. It cannot be passed from one person to another (not contagious). What are the causes? The cause of psoriasis is not known, but certain factors can make the condition worse. These include:  Damage or trauma to the skin, such as cuts, scrapes, sunburn, and dryness.  Lack of sunlight.  Certain medicines.  Alcohol.  Tobacco use.  Stress.  Infections caused by bacteria or viruses.  What increases the risk? This condition is more likely to develop in:  People with a family history of psoriasis.  People who are Caucasian.  People who are between the ages of 15-84 and 89-10 years old.  What are the signs or symptoms? There are five different types of psoriasis. You can have more than one type of psoriasis during your life. Types are:  Plaque.  Guttate.  Inverse.  Pustular.  Erythrodermic.  Each type of psoriasis has different symptoms.  Plaque psoriasis symptoms include red, raised plaques with a silvery white coating (scale). These plaques may be itchy. Your nails may be pitted and crumbly or fall off.  Guttate psoriasis symptoms include small red spots that often show up on your trunk, arms, and legs. These spots may develop after you have been sick, especially with strep throat.  Inverse psoriasis symptoms include plaques in your underarm area, under your breasts, or on your genitals, groin, or buttocks.  Pustular psoriasis symptoms include pus-filled bumps that are painful, red, and swollen on  the palms of your hands or the soles of your feet. You also may feel exhausted, feverish, weak, or have no appetite.  Erythrodermic psoriasis symptoms include bright red skin that may look burned. You may have a fast heartbeat and a body temperature that is too high or too low. You may be itchy or in pain.  How is this diagnosed? Your health care provider may suspect psoriasis based on your symptoms and family history. Your health care provider will also do a physical exam. This may include a procedure to remove a tissue sample (biopsy) for testing. You may also be referred to a health care provider who specializes in skin diseases (dermatologist). How is this treated? There is no cure for this condition, but treatment can help manage it. Goals of treatment include:  Helping your skin heal.  Reducing itching and inflammation.  Slowing the growth of new skin cells.  Helping your immune system respond better to your skin.  Treatment varies, depending on the severity of your condition. Treatment may include:  Creams or ointments.  Ultraviolet ray exposure (light therapy). This may include natural sunlight or light therapy in a medical office.  Medicines (systemic therapy). These medicines can help your body better manage skin cell turnover and inflammation. They may be used along with light therapy or ointments. You may also get antibiotic medicines if you have an infection.  Follow these instructions at home: [place] your skin as needed. Only use moisturizers that have been approved by your health care provider.  Apply cool compresses to  the affected areas.  Do not scratch your skin. Lifestyle   Do not use tobacco products. This includes cigarettes, chewing tobacco, and e-cigarettes. If you need help quitting, ask your health care provider.  Drink little or no alcohol.  Try techniques for stress reduction, such as meditation or yoga.  Get exposure to the sun as  told by your health care provider. Do not get sunburned.  Consider joining a psoriasis support group. Medicines  Take or use over-the-counter and prescription medicines only as told by your health care provider.  If you were prescribed an antibiotic, take or use it as told by your health care provider. Do not stop taking the antibiotic even if your condition starts to improve. General instructions  Keep a journal to help track what triggers an outbreak. Try to avoid any triggers.  See a counselor or social worker if feelings of sadness, frustration, and hopelessness about your condition are interfering with your work and relationships.  Keep all follow-up visits as told by your health care provider. This is important. Contact a health care provider if:  Your pain gets worse.  You have increasing redness or warmth in the affected areas.  You have new or worsening pain or stiffness in your joints.  Your nails start to break easily or pull away from the nail bed.  You have a fever.  You feel depressed. This information is not intended to replace advice given to you by your health care provider. Make sure you discuss any questions you have with your health care provider. Document Released: 10/01/2000 Document Revised: 03/11/2016 Document Reviewed: 02/19/2015 Elsevier Interactive Patient Education  2018 [employer].

## 2018-09-06 ENCOUNTER — Ambulatory Visit (INDEPENDENT_AMBULATORY_CARE_PROVIDER_SITE_OTHER): Payer: Medicare Other | Admitting: Podiatry

## 2018-09-06 ENCOUNTER — Encounter: Payer: Self-pay | Admitting: Podiatry

## 2018-09-06 DIAGNOSIS — B351 Tinea unguium: Secondary | ICD-10-CM

## 2018-09-06 DIAGNOSIS — Q828 Other specified congenital malformations of skin: Secondary | ICD-10-CM

## 2018-09-06 DIAGNOSIS — M79676 Pain in unspecified toe(s): Secondary | ICD-10-CM | POA: Diagnosis not present

## 2018-09-06 NOTE — Progress Notes (Signed)
Subjective:     Patient ID: [name] [name], female   DOB: Mar 17, 1933, 84 y.o.   MRN: [id number]  HPI This patients presents to the office for preventative foot care services.  Patient says nails and calluses have both grown long and thick.  She experiences pain walking and wearing her shoes. Review of Systems     Objective:   Physical Exam GENERAL APPEARANCE: Alert, conversant. Appropriately groomed. No acute distress.  VASCULAR: Pedal pulses palpable at 2/4 DP and PT bilateral.  Capillary refill time is immediate to all digits,  Proximal to distal cooling it warm to warm.  Digital hair growth is present bilateral  NEUROLOGIC: sensation is intact epicritically and protectively to 5.07 monofilament at 5/5 sites bilateral.  Light touch is intact bilateral, vibratory sensation intact bilateral, achilles tendon reflex is intact bilateral.  MUSCULOSKELETAL: acceptable muscle strength, tone and stability bilateral.  Intrinsic muscluature intact bilateral.  Rectus appearance of foot and digits noted bilateral.  NAILS  Thick disfigured discolored nails  both feet. DERMATOLOGIC: skin color, texture, and turgor are within normal limits.  No preulcerative lesions or ulcers  are seen, no interdigital maceration noted.  No open lesions present.  . No drainage noted. Callus sub 3,5 left foot.  Callus sub 5 right foot.       Assessment:     Callus  B/L.  Onychomycosis     Plan:     Debride Nails.  Debride porokeratosis  B/L .   Gardiner Barefoot DPM

## 2018-09-10 NOTE — Assessment & Plan Note (Signed)
Is exercising better and no new pain

## 2018-09-10 NOTE — Progress Notes (Signed)
Subjective:    Patient ID: Miles Costain, female    DOB: 1932/10/25, 84 y.o.   MRN: [id number]  Chief Complaint  Patient presents with  . Follow-up    HPI Patient is in today for reevaluation  She notes her leg pain is improved. No new falls. No syncope or trips to the emergency room.  No recent febrile illness. Denies CP/palp/SOB/HA/congestion/fevers/GI or GU c/o. Taking meds as prescribed. Notes some itchy skin and has discussed   Past Medical History:  Diagnosis Date  . Anemia    h/o fibroids  . Bipolar disorder ([place])   . Depression    bipolar  . Eczema   . Hypothyroidism   . Insomnia   . Osteoarthritis   . Osteoporosis   . Psoriasis     Past Surgical History:  Procedure Laterality Date  . ABDOMINAL HYSTERECTOMY  1982  . APPENDECTOMY  2003  . BREAST LUMPECTOMY  1947   left  . CATARACT EXTRACTION  2010  . EYE SURGERY    . HERNIA REPAIR    . KNEE ARTHROSCOPY  09/07/11   right knee  . NASAL SEPTUM SURGERY  1970's  . TONSILLECTOMY  1970's  . TONSILLECTOMY    . TOTAL KNEE ARTHROPLASTY Right 03/04/2015   Procedure: RIGHT TOTAL KNEE ARTHROPLASTY;  Surgeon: Mcarthur Rossetti, MD;  Location: [facility];  Service: Orthopedics;  Laterality: Right;  . VENTRAL HERNIA REPAIR  2005    Family History  Problem Relation Age of Onset  . Colon cancer Brother   . Lung cancer Father   . Prostate cancer Father   . Arthritis Father   . Cancer Father        lung, prostate  . Heart disease Brother   . Cancer Brother        lung  . Lung cancer Brother   . Aortic aneurysm Brother   . Cancer Other        colon    Social History   Socioeconomic History  . Marital status: Single    Spouse name: Not on file  . Number of children: 1  . Years of education: Not on file  . Highest education level: Not on file  Occupational History  . Occupation: retired    Fish farm manager: RETIRED  Social Needs  . Financial resource strain: Not on file  . Food insecurity:    Worry: Not on file      Inability: Not on file  . Transportation needs:    Medical: Not on file    Non-medical: Not on file  Tobacco Use  . Smoking status: Former Smoker    Last attempt to quit: 09/29/1985    Years since quitting: 32.9  . Smokeless tobacco: Never Used  Substance and Sexual Activity  . Alcohol use: No  . Drug use: No  . Sexual activity: Not Currently    Partners: Male  Lifestyle  . Physical activity:    Days per week: Not on file    Minutes per session: Not on file  . Stress: Not on file  Relationships  . Social connections:    Talks on phone: Not on file    Gets together: Not on file    Attends religious service: Not on file    Active member of club or organization: Not on file    Attends meetings of clubs or organizations: Not on file    Relationship status: Not on file  . Intimate partner violence:    Fear  of current or ex partner: Not on file    Emotionally abused: Not on file    Physically abused: Not on file    Forced sexual activity: Not on file  Other Topics Concern  . Not on file  Social History Narrative   Lives by herself   PT for knee----silver sneakers    Outpatient Medications Prior to Visit  Medication Sig Dispense Refill  . ammonium lactate (LAC-HYDRIN) 12 % cream Apply topically as needed for dry skin. 385 g 0  . ARIPiprazole (ABILIFY) 5 MG tablet Take 1 tablet by mouth daily.    Marland Kitchen b complex vitamins tablet Take 2 tablets by mouth daily.    . calcium carbonate (OS-CAL) 600 MG TABS Take 600 mg by mouth 2 (two) times daily with a meal.      . cholecalciferol (VITAMIN D) 1000 UNITS tablet Take 3,000 Units by mouth daily.     . Cyanocobalamin (B-12) 2500 MCG TABS Take 1 tablet by mouth daily.    Marland Kitchen docusate sodium (COLACE) 100 MG capsule Take 1 capsule (100 mg total) by mouth 2 (two) times daily. 10 capsule 0  . Glucosamine 500 MG TABS Take 1,000 mg by mouth daily.     . hypromellose (GENTEAL) 0.3 % GEL ophthalmic ointment Place 1 application into both eyes at  bedtime.    Marland Kitchen levocetirizine (XYZAL) 5 MG tablet Take 1 tablet (5 mg total) by mouth every evening. 90 tablet 1  . levothyroxine (SYNTHROID, LEVOTHROID) 88 MCG tablet Take 1 tablet (88 mcg total) by mouth daily. 90 tablet 1  . Lutein 40 MG CAPS Take 20 mg by mouth daily.     . multivitamin (THERAGRAN) per tablet Take 1 tablet by mouth daily.      . ranitidine (ZANTAC) 150 MG tablet Take 1 tablet (150 mg total) by mouth 2 (two) times daily. 60 tablet 5  . tiZANidine (ZANAFLEX) 2 MG tablet TAKE 1/2 TO 1 TABLET (1-2 MG TOTAL) BY MOUTH AT BEDTIME AS NEEDED FOR MUSCLE SPASMS. 30 tablet 1  . zolpidem (AMBIEN) 10 MG tablet Take 10 mg by mouth at bedtime.     Marland Kitchen albuterol (PROVENTIL HFA;VENTOLIN HFA) 108 (90 Base) MCG/ACT inhaler Inhale 1-2 puffs into the lungs every 6 (six) hours as needed for wheezing or shortness of breath (cough, shortness of breath or wheezing.). (Patient not taking: Reported on 08/04/2018) 1 Inhaler 1  . fish oil-omega-3 fatty acids 1000 MG capsule Take 1 g by mouth daily.      . methocarbamol (ROBAXIN) 500 MG tablet Take 1 tablet (500 mg total) by mouth every 6 (six) hours as needed for muscle spasms. 40 tablet 1  . ondansetron (ZOFRAN) 8 MG tablet Take 1 tablet (8 mg total) by mouth 3 (three) times daily as needed for nausea or vomiting. 20 tablet 0   No facility-administered medications prior to visit.     Allergies  Allergen Reactions  . Bupropion Hcl Rash    Review of Systems  Constitutional: Negative for fever and malaise/fatigue.  HENT: Negative for congestion.   Eyes: Negative for blurred vision.  Respiratory: Negative for shortness of breath.   Cardiovascular: Negative for chest pain, palpitations and leg swelling.  Gastrointestinal: Negative for abdominal pain, blood in stool and nausea.  Genitourinary: Negative for dysuria and frequency.  Musculoskeletal: Positive for joint pain. Negative for falls.  Skin: Negative for rash.  Neurological: Negative for  dizziness, loss of consciousness and headaches.  Endo/Heme/Allergies: Negative for environmental allergies.  Psychiatric/Behavioral: Negative for depression. The patient is not nervous/anxious.         Objective:    Physical Exam  Constitutional: She is oriented to person, place, and time. She appears well-developed and well-nourished. No distress.  HENT:  Head: Normocephalic and atraumatic.  Nose: Nose normal.  Eyes: Right eye exhibits no discharge. Left eye exhibits no discharge.  Neck: Normal range of motion. Neck supple.  Cardiovascular: Normal rate and regular rhythm.  No murmur heard. Pulmonary/Chest: Effort normal and breath sounds normal.  Abdominal: Soft. Bowel sounds are normal. There is no tenderness.  Musculoskeletal: She exhibits no edema.  Neurological: She is alert and oriented to person, place, and time.  Skin: Skin is warm and dry.  Psychiatric: She has a normal mood and affect.  Nursing note and vitals reviewed.   BP 124/68 (BP Location: Left Arm, Patient Position: Sitting, Cuff Size: Normal)   Pulse 86   Temp 97.8 F (36.6 C) (Oral)   Resp 18   Ht 5' 5.5" (1.664 m)   Wt 147 lb (66.7 kg)   SpO2 97%   BMI 24.09 kg/m  Wt Readings from Last 3 Encounters:  09/05/18 147 lb (66.7 kg)  08/04/18 147 lb 9.6 oz (67 kg)  06/06/18 149 lb 3.2 oz (67.7 kg)     Lab Results  Component Value Date   WBC 5.7 05/22/2018   HGB 14.0 05/22/2018   HCT 41 05/22/2018   PLT 283 05/22/2018   GLUCOSE 99 08/04/2018   CHOL 224 (A) 05/22/2018   TRIG 91 05/22/2018   HDL 66 05/22/2018   LDLDIRECT 150.7 09/06/2013   LDLCALC 140 05/22/2018   ALT 20 08/04/2018   AST 20 08/04/2018   NA 141 08/04/2018   K 3.6 08/04/2018   CL 105 08/04/2018   CREATININE 1.21 (H) 08/04/2018   BUN 25 (H) 08/04/2018   CO2 26 08/04/2018   TSH 5.18 05/22/2018   TSH 5.18 05/22/2018   HGBA1C 5.4 05/22/2018    Lab Results  Component Value Date   TSH 5.18 05/22/2018   TSH 5.18 05/22/2018    Lab Results  Component Value Date   WBC 5.7 05/22/2018   HGB 14.0 05/22/2018   HCT 41 05/22/2018   MCV 93.8 08/29/2017   PLT 283 05/22/2018   Lab Results  Component Value Date   NA 141 08/04/2018   K 3.6 08/04/2018   CO2 26 08/04/2018   GLUCOSE 99 08/04/2018   BUN 25 (H) 08/04/2018   CREATININE 1.21 (H) 08/04/2018   BILITOT 0.5 08/04/2018   ALKPHOS 28 (L) 08/04/2018   AST 20 08/04/2018   ALT 20 08/04/2018   PROT 7.3 08/04/2018   ALBUMIN 4.3 08/04/2018   CALCIUM 9.9 08/04/2018   ANIONGAP 10 03/05/2015   GFR 45.16 (L) 08/04/2018   Lab Results  Component Value Date   CHOL 224 (A) 05/22/2018   Lab Results  Component Value Date   HDL 66 05/22/2018   Lab Results  Component Value Date   LDLCALC 140 05/22/2018   Lab Results  Component Value Date   TRIG 91 05/22/2018   Lab Results  Component Value Date   CHOLHDL 4 09/06/2013   Lab Results  Component Value Date   HGBA1C 5.4 05/22/2018       Assessment & Plan:   Problem List Items Addressed This Visit    Hypothyroidism    On Levothyroxine, continue to monitor      Psoriasis    Hydrate and moisturize,  consider topical steroids to start.       Osteoarthritis    Is exercising better and no new pain      Dyspepsia    Avoid offending foods, start probiotics. Do not eat large meals in late evening and consider raising head of bed.          I have discontinued Niti A. Garrabrant's fish oil-omega-3 fatty acids, methocarbamol, ondansetron, and albuterol. I am also having her maintain her zolpidem, calcium carbonate, Lutein, multivitamin, cholecalciferol, b complex vitamins, B-12, Glucosamine, ammonium lactate, ARIPiprazole, hypromellose, docusate sodium, levocetirizine, ranitidine, levothyroxine, and tiZANidine.  No orders of the defined types were placed in this encounter.    Penni Homans, MD

## 2018-09-10 NOTE — Assessment & Plan Note (Signed)
Hydrate and moisturize, consider topical steroids to start.

## 2018-09-10 NOTE — Assessment & Plan Note (Signed)
On Levothyroxine, continue to monitor 

## 2018-09-10 NOTE — Assessment & Plan Note (Signed)
Avoid offending foods, start probiotics. Do not eat large meals in late evening and consider raising head of bed.  

## 2018-09-22 ENCOUNTER — Other Ambulatory Visit: Payer: Self-pay | Admitting: Family Medicine

## 2018-09-26 ENCOUNTER — Telehealth: Payer: Self-pay

## 2018-09-26 NOTE — Telephone Encounter (Signed)
Author re-verified benefits for [id number]. No PA needed, pt. may owe approximately $0 OOP. Author phoned pt. to relay and set up NV for prolia injection.  NV made for 10/02/2018. At [facility]. Author to order prolia for fridge.

## 2018-10-02 ENCOUNTER — Ambulatory Visit (INDEPENDENT_AMBULATORY_CARE_PROVIDER_SITE_OTHER): Payer: Medicare Other

## 2018-10-02 DIAGNOSIS — M1990 Unspecified osteoarthritis, unspecified site: Secondary | ICD-10-CM | POA: Diagnosis not present

## 2018-10-02 MED ORDER — DENOSUMAB 60 MG/ML ~~LOC~~ SOSY
60.0000 mg | PREFILLED_SYRINGE | Freq: Once | SUBCUTANEOUS | Status: AC
Start: 2018-10-02 — End: 2018-10-02
  Administered 2018-10-02: 60 mg via SUBCUTANEOUS

## 2018-10-02 NOTE — Progress Notes (Signed)
10/02/18   Patient in for Prolia injection per order from  Dr. Carollee Herter.  Given 60 mg SQ left arm.  Patient tolerated well.  No complaints this visit.  Reminder card given for next injection.

## 2018-10-26 DIAGNOSIS — F319 Bipolar disorder, unspecified: Secondary | ICD-10-CM | POA: Diagnosis not present

## 2018-11-14 ENCOUNTER — Encounter: Payer: Self-pay | Admitting: Podiatry

## 2018-11-14 ENCOUNTER — Ambulatory Visit (INDEPENDENT_AMBULATORY_CARE_PROVIDER_SITE_OTHER): Payer: Medicare Other | Admitting: Podiatry

## 2018-11-14 DIAGNOSIS — M79676 Pain in unspecified toe(s): Secondary | ICD-10-CM | POA: Diagnosis not present

## 2018-11-14 DIAGNOSIS — B351 Tinea unguium: Secondary | ICD-10-CM | POA: Diagnosis not present

## 2018-11-14 NOTE — Progress Notes (Signed)
Complaint:  Visit Type: Patient returns to my office for continued preventative foot care services. Complaint: Patient states" my nails have grown long and thick and become painful to walk and wear shoes" . The patient presents for preventative foot care services. No changes to ROS  Podiatric Exam: Vascular: dorsalis pedis and posterior tibial pulses are palpable bilateral. Capillary return is immediate. Temperature gradient is WNL. Skin turgor WNL  Sensorium: Normal Semmes Weinstein monofilament test. Normal tactile sensation bilaterally. Nail Exam: Pt has thick disfigured discolored nails with subungual debris noted bilateral entire nail hallux through fifth toenails Ulcer Exam: There is no evidence of ulcer or pre-ulcerative changes or infection. Orthopedic Exam: Muscle tone and strength are WNL. No limitations in general ROM. No crepitus or effusions noted. Foot type and digits show no abnormalities. Bony prominences are unremarkable. Skin: Asymptomatic callus sub 5th metatarsal  B/L . No infection or ulcers  Diagnosis:  Onychomycosis, , Pain in right toe, pain in left toes  Treatment & Plan Procedures and Treatment: Consent by patient was obtained for treatment procedures.   Debridement of mycotic and hypertrophic toenails, 1 through 5 bilateral and clearing of subungual debris. No ulceration, no infection noted. ABN signed for 2020. Return Visit-Office Procedure: Patient instructed to return to the office for a follow up visit 3 months for continued evaluation and treatment.    Gardiner Barefoot DPM

## 2018-11-18 ENCOUNTER — Observation Stay (HOSPITAL_COMMUNITY): Payer: Medicare Other

## 2018-11-18 ENCOUNTER — Emergency Department (HOSPITAL_COMMUNITY): Payer: Medicare Other

## 2018-11-18 ENCOUNTER — Inpatient Hospital Stay (HOSPITAL_COMMUNITY)
Admission: EM | Admit: 2018-11-18 | Discharge: 2018-11-20 | DRG: 041 | Disposition: A | Discharge status: Rehabilitation Facility | Payer: Medicare Other | Attending: Family Medicine | Admitting: Family Medicine

## 2018-11-18 ENCOUNTER — Encounter (HOSPITAL_COMMUNITY): Payer: Self-pay | Admitting: *Deleted

## 2018-11-18 DIAGNOSIS — E039 Hypothyroidism, unspecified: Secondary | ICD-10-CM | POA: Diagnosis present

## 2018-11-18 DIAGNOSIS — M810 Age-related osteoporosis without current pathological fracture: Secondary | ICD-10-CM | POA: Diagnosis present

## 2018-11-18 DIAGNOSIS — I639 Cerebral infarction, unspecified: Secondary | ICD-10-CM | POA: Diagnosis not present

## 2018-11-18 DIAGNOSIS — E785 Hyperlipidemia, unspecified: Secondary | ICD-10-CM | POA: Diagnosis present

## 2018-11-18 DIAGNOSIS — Z79899 Other long term (current) drug therapy: Secondary | ICD-10-CM

## 2018-11-18 DIAGNOSIS — Z66 Do not resuscitate: Secondary | ICD-10-CM | POA: Diagnosis not present

## 2018-11-18 DIAGNOSIS — R29703 NIHSS score 3: Secondary | ICD-10-CM | POA: Diagnosis present

## 2018-11-18 DIAGNOSIS — Z8673 Personal history of transient ischemic attack (TIA), and cerebral infarction without residual deficits: Secondary | ICD-10-CM | POA: Diagnosis present

## 2018-11-18 DIAGNOSIS — Z87891 Personal history of nicotine dependence: Secondary | ICD-10-CM

## 2018-11-18 DIAGNOSIS — M6281 Muscle weakness (generalized): Secondary | ICD-10-CM | POA: Diagnosis not present

## 2018-11-18 DIAGNOSIS — Z96651 Presence of right artificial knee joint: Secondary | ICD-10-CM | POA: Diagnosis present

## 2018-11-18 DIAGNOSIS — Z79890 Hormone replacement therapy (postmenopausal): Secondary | ICD-10-CM

## 2018-11-18 DIAGNOSIS — G8191 Hemiplegia, unspecified affecting right dominant side: Secondary | ICD-10-CM | POA: Diagnosis not present

## 2018-11-18 DIAGNOSIS — Z90710 Acquired absence of both cervix and uterus: Secondary | ICD-10-CM | POA: Diagnosis not present

## 2018-11-18 DIAGNOSIS — I4891 Unspecified atrial fibrillation: Secondary | ICD-10-CM | POA: Diagnosis not present

## 2018-11-18 DIAGNOSIS — I63522 Cerebral infarction due to unspecified occlusion or stenosis of left anterior cerebral artery: Secondary | ICD-10-CM | POA: Diagnosis not present

## 2018-11-18 DIAGNOSIS — F319 Bipolar disorder, unspecified: Secondary | ICD-10-CM | POA: Diagnosis not present

## 2018-11-18 DIAGNOSIS — R0602 Shortness of breath: Secondary | ICD-10-CM | POA: Diagnosis not present

## 2018-11-18 DIAGNOSIS — R531 Weakness: Secondary | ICD-10-CM | POA: Diagnosis not present

## 2018-11-18 DIAGNOSIS — I63412 Cerebral infarction due to embolism of left middle cerebral artery: Principal | ICD-10-CM | POA: Diagnosis present

## 2018-11-18 DIAGNOSIS — R079 Chest pain, unspecified: Secondary | ICD-10-CM | POA: Diagnosis not present

## 2018-11-18 DIAGNOSIS — I10 Essential (primary) hypertension: Secondary | ICD-10-CM | POA: Diagnosis present

## 2018-11-18 DIAGNOSIS — I63512 Cerebral infarction due to unspecified occlusion or stenosis of left middle cerebral artery: Secondary | ICD-10-CM | POA: Diagnosis not present

## 2018-11-18 DIAGNOSIS — I6930 Unspecified sequelae of cerebral infarction: Secondary | ICD-10-CM

## 2018-11-18 LAB — COMPREHENSIVE METABOLIC PANEL
ALT: 20 U/L (ref 0–44)
AST: 26 U/L (ref 15–41)
Albumin: 4.4 g/dL (ref 3.5–5.0)
Alkaline Phosphatase: 25 U/L — ABNORMAL LOW (ref 38–126)
Anion gap: 9 (ref 5–15)
BUN: 16 mg/dL (ref 8–23)
CO2: 25 mmol/L (ref 22–32)
Calcium: 9.2 mg/dL (ref 8.9–10.3)
Chloride: 109 mmol/L (ref 98–111)
Creatinine, Ser: 0.9 mg/dL (ref 0.44–1.00)
GFR calc Af Amer: >60 mL/min (ref >60)
GFR calc non Af Amer: 59 mL/min — ABNORMAL LOW (ref >60)
Glucose, Bld: 99 mg/dL (ref 70–99)
Potassium: 3.5 mmol/L (ref 3.5–5.1)
Sodium: 143 mmol/L (ref 135–145)
Total Bilirubin: 0.7 mg/dL (ref 0.3–1.2)
Total Protein: 7.7 g/dL (ref 6.5–8.1)

## 2018-11-18 LAB — DIFFERENTIAL
Abs Immature Granulocytes: 0.05 10*3/uL (ref 0.00–0.07)
Basophils Absolute: 0.1 10*3/uL (ref 0.0–0.1)
Basophils Relative: 1 %
Eosinophils Absolute: 0 10*3/uL (ref 0.0–0.5)
Eosinophils Relative: 0 %
Immature Granulocytes: 1 %
Lymphocytes Relative: 19 %
Lymphs Abs: 1.8 10*3/uL (ref 0.7–4.0)
Monocytes Absolute: 0.8 10*3/uL (ref 0.1–1.0)
Monocytes Relative: 9 %
Neutro Abs: 6.7 10*3/uL (ref 1.7–7.7)
Neutrophils Relative %: 70 %

## 2018-11-18 LAB — I-STAT TROPONIN, ED: Troponin i, poc: 0 ng/mL (ref 0.00–0.08)

## 2018-11-18 LAB — CBC
HCT: 43.9 % (ref 36.0–46.0)
Hemoglobin: 14.4 g/dL (ref 12.0–15.0)
MCH: 33.2 pg (ref 26.0–34.0)
MCHC: 32.8 g/dL (ref 30.0–36.0)
MCV: 101.2 fL — ABNORMAL HIGH (ref 80.0–100.0)
Platelets: 254 10*3/uL (ref 150–400)
RBC: 4.34 MIL/uL (ref 3.87–5.11)
RDW: 13.3 % (ref 11.5–15.5)
WBC: 9.4 10*3/uL (ref 4.0–10.5)
nRBC: 0 % (ref 0.0–0.2)

## 2018-11-18 LAB — PROTIME-INR
INR: 0.9
Prothrombin Time: 12.1 seconds (ref 11.4–15.2)

## 2018-11-18 LAB — APTT: aPTT: 28 seconds (ref 24–36)

## 2018-11-18 LAB — CBG MONITORING, ED: Glucose-Capillary: 91 mg/dL (ref 70–99)

## 2018-11-18 MED ORDER — ACETAMINOPHEN 160 MG/5ML PO SOLN: 650.0000 mg | ORAL | Status: DC | PRN

## 2018-11-18 MED ORDER — SODIUM CHLORIDE 0.9 % IV SOLN
INTRAVENOUS | Status: DC
  Administered 2018-11-18: 18:00:00 via INTRAVENOUS

## 2018-11-18 MED ORDER — ASPIRIN 81 MG PO CHEW: 324.0000 mg | CHEWABLE_TABLET | Freq: Once | ORAL | Status: DC

## 2018-11-18 MED ORDER — ACETAMINOPHEN 650 MG RE SUPP: 650.0000 mg | RECTAL | Status: DC | PRN

## 2018-11-18 MED ORDER — STROKE: EARLY STAGES OF RECOVERY BOOK
Freq: Once | Status: DC
  Filled 2018-11-18: qty 1

## 2018-11-18 MED ORDER — LABETALOL HCL 5 MG/ML IV SOLN
10.0000 mg | INTRAVENOUS | Status: DC | PRN
  Administered 2018-11-18: 10 mg via INTRAVENOUS
  Filled 2018-11-18: qty 4

## 2018-11-18 MED ORDER — MELATONIN 3 MG PO TABS
15.0000 mg | ORAL_TABLET | Freq: Every day | ORAL | Status: DC
  Administered 2018-11-18 – 2018-11-19 (×2): 15 mg via ORAL
  Filled 2018-11-18 (×3): qty 5

## 2018-11-18 MED ORDER — ENOXAPARIN SODIUM 40 MG/0.4ML ~~LOC~~ SOLN
40.0000 mg | SUBCUTANEOUS | Status: DC
  Administered 2018-11-18 – 2018-11-19 (×2): 40 mg via SUBCUTANEOUS
  Filled 2018-11-18: qty 0.4

## 2018-11-18 MED ORDER — HYDRALAZINE HCL 20 MG/ML IJ SOLN
5.0000 mg | Freq: Four times a day (QID) | INTRAMUSCULAR | Status: DC | PRN
  Administered 2018-11-18: 5 mg via INTRAVENOUS
  Filled 2018-11-18: qty 1

## 2018-11-18 MED ORDER — ASPIRIN 300 MG RE SUPP: 300.0000 mg | Freq: Every day | RECTAL | Status: DC

## 2018-11-18 MED ORDER — ASPIRIN 325 MG PO TABS
325.0000 mg | ORAL_TABLET | Freq: Every day | ORAL | Status: DC
  Administered 2018-11-18: 325 mg via ORAL
  Filled 2018-11-18: qty 1

## 2018-11-18 MED ORDER — SODIUM CHLORIDE 0.9% FLUSH
3.0000 mL | Freq: Once | INTRAVENOUS | Status: AC
Start: 2018-11-18 — End: 2018-11-18
  Administered 2018-11-18: 3 mL via INTRAVENOUS

## 2018-11-18 MED ORDER — LEVOTHYROXINE SODIUM 88 MCG PO TABS
88.0000 ug | ORAL_TABLET | Freq: Every day | ORAL | Status: DC
  Administered 2018-11-19: 88 ug via ORAL
  Filled 2018-11-18 (×2): qty 1

## 2018-11-18 MED ORDER — HYDRALAZINE HCL 20 MG/ML IJ SOLN: 5.0000 mg | Freq: Four times a day (QID) | INTRAMUSCULAR | Status: DC | PRN

## 2018-11-18 MED ORDER — ASPIRIN 325 MG PO TABS
325.0000 mg | ORAL_TABLET | Freq: Every day | ORAL | Status: DC
  Administered 2018-11-19 – 2018-11-20 (×2): 325 mg via ORAL
  Filled 2018-11-18 (×2): qty 1

## 2018-11-18 MED ORDER — ACETAMINOPHEN 325 MG PO TABS: 650.0000 mg | ORAL_TABLET | ORAL | Status: DC | PRN

## 2018-11-18 MED ORDER — DOCUSATE SODIUM 100 MG PO CAPS: 100.0000 mg | ORAL_CAPSULE | Freq: Every day | ORAL | Status: DC | PRN

## 2018-11-18 MED ORDER — ARIPIPRAZOLE 10 MG PO TABS
5.0000 mg | ORAL_TABLET | Freq: Every day | ORAL | Status: DC
  Administered 2018-11-19 – 2018-11-20 (×2): 5 mg via ORAL
  Filled 2018-11-18 (×2): qty 1

## 2018-11-18 NOTE — ED Notes (Signed)
Patient transported to CT 

## 2018-11-18 NOTE — ED Notes (Signed)
Patient stated she's able to ambulate on her own without assistance.

## 2018-11-18 NOTE — ED Triage Notes (Signed)
Pt woke up with right arm and leg weakness. Pt went to bed at 11 normal, woke up with symptoms at 9. No facial droop or slurred speech. Right arm fell with eyes closed.

## 2018-11-18 NOTE — Consult Note (Signed)
[facility] Tele[facility]urology Consult Services   Date of Service:   11/18/2018 14:24:57  Impression:     .  RO Acute Ischemic Stroke  Comments: 85 year old female who presents to the hospital with right arm and leg weakness. Presentation is concerning for a stroke in the left basal ganglia.  Mechanism of Stroke: Not Clear  Metrics: Last Known Well: 11/17/2018 23:00:00 [facility] Notification Time: 11/18/2018 14:24:57 Arrival Time: 11/18/2018 14:08:00 Stamp Time: 11/18/2018 14:24:57 Time First Login Attempt: 11/18/2018 14:31:00 Video Start Time: 11/18/2018 14:31:00  Symptoms: Right arm and leg weakness NIHSS Start Assessment Time: 11/18/2018 14:33:00 Patient is not a candidate for tPA. Patient was not deemed candidate for tPA thrombolytics because of Last Well Known Above 4.5 Hours. Video End Time: 11/18/2018 14:41:00  CT head was reviewed.  Presentation is not suggestive of Large Vessel Occlusive disease.  Advanced imaging was not obtained as the presentation was not suggestive of Large Vessel Occlusive Disease.   ED Physician notified of diagnostic impression and management plan on 11/18/2018 14:43:00  Our recommendations are outlined below.  Recommendations:     .  Activate Stroke Protocol Admission/Order Set     .  Stroke/Telemetry Floor     .  Neuro Checks     .  Bedside Swallow Eval     .  DVT Prophylaxis     .  IV Fluids, Normal Saline     .  Head of Bed Below 30 Degrees     .  Euglycemia and Avoid Hyperthermia (PRN Acetaminophen)     .  Antiplatelet Therapy Recommended  Routine Consultation with [facility] for Follow up Care  Sign Out:     .  Discussed with Emergency Department Provider    ------------------------------------------------------------------------------  History of Present Illness: Patient is a 85 year old Female.  Patient was brought by private transportation with symptoms of Right arm and leg  weakness  85 year old female with a history of bipolar who presents to the hospital because of right arm and leg weakness which she noticed upon waking this morning. She was last normal when she went to bed at 23:00 last night. On exam patient also reports decreased sensation on the right.  CT head was reviewed.    Examination: 1A: Level of Consciousness - Alert; keenly responsive + 0 1B: Ask Month and Age - Both Questions Right + 0 1C: Blink Eyes & Squeeze Hands - Performs Both Tasks + 0 2: Test Horizontal Extraocular Movements - Normal + 0 3: Test Visual Fields - No Visual Loss + 0 4: Test Facial Palsy (Use [place] if Obtunded) - Normal symmetry + 0 5A: Test Left Arm Motor Drift - No Drift for 10 Seconds + 0 5B: Test Right Arm Motor Drift - Drift, but doesn't hit bed + 1 6A: Test Left Leg Motor Drift - No Drift for 5 Seconds + 0 6B: Test Right Leg Motor Drift - Drift, but doesn't hit bed + 1 7: Test Limb Ataxia (FNF/Heel-Shin) - No Ataxia + 0 8: Test Sensation - Mild-Moderate Loss: Less Sharp/More Dull + 1 9: Test Language/Aphasia - Normal; No aphasia + 0 10: Test Dysarthria - Normal + 0 11: Test Extinction/Inattention - No abnormality + 0  NIHSS Score: 3  Patient was informed the Neurology Consult would happen via TeleHealth consult by way of interactive audio and video telecommunications and consented to receiving care in this manner.  Due to the immediate potential for life-threatening deterioration due to underlying  acute neurologic illness, I spent 35 minutes providing critical care. This time includes time for face to face visit via telemedicine, review of medical records, imaging studies and discussion of findings with providers, the patient and/or family.   Dr Tsosie Billing   [facility] [phone]  Case [id number]

## 2018-11-18 NOTE — ED Notes (Signed)
[facility] has been contacted for patient transport to [facility].

## 2018-11-18 NOTE — Progress Notes (Signed)
P.t. has arrived on 3w. She is alert and oriented x4. Son is at bedside.

## 2018-11-18 NOTE — ED Notes (Signed)
ED TO INPATIENT HANDOFF REPORT  Name/Age/Gender Miles Costain 85 y.o. female  Code Status Code Status History    Date Active Date Inactive Code Status Order ID Comments User Context   03/04/2015 2004 03/07/2015 1525 Full Code [id number]  Mcarthur Rossetti, MD Inpatient   07/11/2014 1350 07/12/2014 0331 Full Code [id number]  Logan Bores, MD HOV      Home/SNF/Other Home  Chief Complaint possible stroke  Level of Care/Admitting Diagnosis ED Disposition    ED Disposition Condition [place]: [place] [100100]  Level of Care: Progressive [102]  I expect the patient will be discharged within 24 hours: No (not a candidate for 5C-Observation unit)  Diagnosis: Stroke (cerebrum) [facility]) [235361]  Admitting Physician: Elmarie Shiley [phone]  Attending Physician: Niel Hummer A [[id number]]  PT Class (Do Not Modify): Observation [104]  PT Acc Code (Do Not Modify): Observation [10022]       Medical History Past Medical History:  Diagnosis Date  . Anemia    h/o fibroids  . Bipolar disorder ([place])   . Depression    bipolar  . Eczema   . Hypothyroidism   . Insomnia   . Osteoarthritis   . Osteoporosis   . Psoriasis     Allergies Allergies  Allergen Reactions  . Bupropion Hcl Rash    IV Location/Drains/Wounds Patient Lines/Drains/Airways Status   Active Line/Drains/Airways    Name:   Placement date:   Placement time:   Site:   Days:   Peripheral IV 08/29/17 Anterior;Left Antecubital   08/29/17    1821    Antecubital   446   Peripheral IV 11/18/18 Left Antecubital   11/18/18    1552    Antecubital   less than 1   Incision (Closed) 03/04/15 Knee Right   03/04/15    1355     1355          Labs/Imaging Results for orders placed or performed during the hospital encounter of 11/18/18 (from the past 48 hour(s))  Protime-INR     Status: None   Collection Time: 11/18/18  2:16 PM  Result Value Ref Range   Prothrombin Time 12.1  11.4 - 15.2 seconds   INR 0.90     Comment: Performed at [facility], [place] [address], [place], [place] [address]  APTT     Status: None   Collection Time: 11/18/18  2:16 PM  Result Value Ref Range   aPTT 28 24 - 36 seconds    Comment: Performed at [facility], [place] [address] [place], [place] [address]  CBC     Status: Abnormal   Collection Time: 11/18/18  2:16 PM  Result Value Ref Range   WBC 9.4 4.0 - 10.5 K/uL   RBC 4.34 3.87 - 5.11 MIL/uL   Hemoglobin 14.4 12.0 - 15.0 g/dL   HCT 43.9 36.0 - 46.0 %   MCV 101.2 (H) 80.0 - 100.0 fL   MCH 33.2 26.0 - 34.0 pg   MCHC 32.8 30.0 - 36.0 g/dL   RDW 13.3 11.5 - 15.5 %   Platelets 254 150 - 400 K/uL   nRBC 0.0 0.0 - 0.2 %    Comment: Performed at [facility], [place] [address] [place],  [address]  Differential     Status: None   Collection Time: 11/18/18  2:16 PM  Result Value Ref Range   Neutrophils Relative % 70 %  Neutro Abs 6.7 1.7 - 7.7 K/uL   Lymphocytes Relative 19 %   Lymphs Abs 1.8 0.7 - 4.0 K/uL   Monocytes Relative 9 %   Monocytes Absolute 0.8 0.1 - 1.0 K/uL   Eosinophils Relative 0 %   Eosinophils Absolute 0.0 0.0 - 0.5 K/uL   Basophils Relative 1 %   Basophils Absolute 0.1 0.0 - 0.1 K/uL   Immature Granulocytes 1 %   Abs Immature Granulocytes 0.05 0.00 - 0.07 K/uL    Comment: Performed at [facility], [place] [address], [place], [place] [address]  Comprehensive metabolic panel     Status: Abnormal   Collection Time: 11/18/18  2:16 PM  Result Value Ref Range   Sodium 143 135 - 145 mmol/L   Potassium 3.5 3.5 - 5.1 mmol/L   Chloride 109 98 - 111 mmol/L   CO2 25 22 - 32 mmol/L   Glucose, Bld 99 70 - 99 mg/dL   BUN 16 8 - 23 mg/dL   Creatinine, Ser 0.90 0.44 - 1.00 mg/dL   Calcium 9.2 8.9 - 10.3 mg/dL   Total Protein 7.7 6.5 - 8.1 g/dL   Albumin 4.4 3.5 - 5.0 g/dL   AST 26 15 - 41 U/L   ALT 20 0 - 44 U/L   Alkaline Phosphatase 25  (L) 38 - 126 U/L   Total Bilirubin 0.7 0.3 - 1.2 mg/dL   GFR calc non Af Amer 59 (L) >60 mL/min   GFR calc Af Amer >60 >60 mL/min   Anion gap 9 5 - 15    Comment: Performed at [facility], [place] [address] [place],  [address]  CBG monitoring, ED     Status: None   Collection Time: 11/18/18  2:54 PM  Result Value Ref Range   Glucose-Capillary 91 70 - 99 mg/dL  I-stat troponin, ED     Status: None   Collection Time: 11/18/18  3:02 PM  Result Value Ref Range   Troponin i, poc 0.00 0.00 - 0.08 ng/mL   Comment 3            Comment: Due to the release kinetics of cTnI, a negative result within the first hours of the onset of symptoms does not rule out myocardial infarction with certainty. If myocardial infarction is still suspected, repeat the test at appropriate intervals.    Ct Head Wo Contrast  Result Date: 11/18/2018 CLINICAL DATA:  85 year old female with acute RIGHT arm and leg weakness. EXAM: CT HEAD WITHOUT CONTRAST TECHNIQUE: Contiguous axial images were obtained from the base of the skull through the vertex without intravenous contrast. COMPARISON:  10/15/2014 CT FINDINGS: Brain: A small linear high LEFT frontal white matter hypodensity extends to the cortex and may represent an infarct of uncertain chronicity. No evidence of hemorrhage, hydrocephalus, extra-axial collection or mass lesion/mass effect. Chronic small-vessel white matter ischemic changes are noted. Vascular: Carotid atherosclerotic calcifications identified. Skull: No acute or suspicious bony abnormalities. Sinuses/Orbits: No acute finding. Other: None IMPRESSION: 1. Small linear high LEFT frontal white matter hypodensity extending to the cortex-question infarct of uncertain chronicity. No hemorrhage. 2. Chronic small-vessel white matter ischemic changes. Electronically Signed   By: Margarette Canada M.D.   On: 11/18/2018 15:02    Pending Labs [employer] (From admission, onward)    Start      Ordered   Signed and Held  Hemoglobin A1c  Tomorrow morning,   R     Signed and Held  Signed and Held  Lipid panel  Tomorrow morning,   R    Comments:  Fasting    Signed and Held   Signed and Held  Creatinine, serum  (enoxaparin (LOVENOX)    CrCl >/= 30 ml/min)  Weekly,   R    Comments:  while on enoxaparin therapy    Signed and Held          Vitals/Pain Today's Vitals   11/18/18 1422 11/18/18 1545 11/18/18 1600 11/18/18 1611  BP:  (!) 205/110 (!) 235/134 (!) 238/123  Pulse:  82 75 80  Resp:  (!) 26 16 16   Temp: 98.6 F (37 C)     TempSrc: Oral     SpO2:  99% 98% 98%  PainSc:        Isolation Precautions No active isolations  Medications Medications  aspirin tablet 325 mg (325 mg Oral Given 11/18/18 1553)  hydrALAZINE (APRESOLINE) injection 5 mg (5 mg Intravenous Given 11/18/18 1630)  labetalol (NORMODYNE,TRANDATE) injection 10 mg (has no administration in time range)  sodium chloride flush (NS) 0.9 % injection 3 mL (3 mLs Intravenous Given 11/18/18 1502)    Mobility walks

## 2018-11-18 NOTE — ED Notes (Signed)
Bed: VT55 Expected date:  Expected time:  Means of arrival:  Comments: Stroke, triage

## 2018-11-18 NOTE — H&P (Signed)
History and Physical  [name] [name] HDQ:[id number] DOB: March 22, 1933 DOA: 11/18/2018  PCP: Ann Held, DO Patient coming from: Home  I have personally briefly reviewed patient's old medical records in [place]   Chief Complaint: Right side weakness.   HPI: [name] [name] is a 85 y.o. female with past medical history significant for depression, hypothyroidism, psoriasis who presents complaining of right side weakness.  Patient was last seen normal January 31 at 11 PM.  Patient reports that she wake up this morning with right weakness.  She denies headache, denies slurry speech, no vision changes, no difficulty swallowing.  Patient also denies chest pain, shortness of breath.  She does not take aspirin daily.  Patient wake up at 9 AM today, she went to the bathroom and and she ended up falling on the floor because her right leg will not hold her.  He was also feeling weakness on her right arm as well.  He then called family on still up about 2 PM.  Evaluation  in the ED: Sodium 143, potassium 3.5, BUN 16, creatinine 1.9, troponin negative, white blood cell 9.4, hemoglobin 14, CT head:1. Small linear high LEFT frontal white matter hypodensity extending to the cortex-question infarct of uncertain chronicity. No hemorrhage. Chronic small-vessel white matter ischemic changes.   Review of Systems: All systems reviewed and apart from history of presenting illness, are negative.  Past Medical History:  Diagnosis Date  . Anemia    h/o fibroids  . Bipolar disorder ([name])   . Depression    bipolar  . Eczema   . Hypothyroidism   . Insomnia   . Osteoarthritis   . Osteoporosis   . Psoriasis    Past Surgical History:  Procedure Laterality Date  . ABDOMINAL HYSTERECTOMY  1982  . APPENDECTOMY  2003  . BREAST LUMPECTOMY  1947   left  . CATARACT EXTRACTION  2010  . EYE SURGERY    . HERNIA REPAIR    . KNEE ARTHROSCOPY  09/07/11   right knee  . NASAL SEPTUM SURGERY  1970's    . TONSILLECTOMY  1970's  . TONSILLECTOMY    . TOTAL KNEE ARTHROPLASTY Right 03/04/2015   Procedure: RIGHT TOTAL KNEE ARTHROPLASTY;  Surgeon: Mcarthur Rossetti, MD;  Location: [place];  Service: Orthopedics;  Laterality: Right;  . VENTRAL HERNIA REPAIR  2005   Social History:  reports that she quit smoking about 33 years ago. She has never used smokeless tobacco. She reports that she does not drink alcohol or use drugs.   Allergies  Allergen Reactions  . Bupropion Hcl Rash    Family History  Problem Relation Age of Onset  . Colon cancer Brother   . Lung cancer Father   . Prostate cancer Father   . Arthritis Father   . Cancer Father        lung, prostate  . Heart disease Brother   . Cancer Brother        lung  . Lung cancer Brother   . Aortic aneurysm Brother   . Cancer Other        colon    Prior to Admission medications   Medication Sig Start Date End Date Taking? Authorizing Provider  ammonium lactate (LAC-HYDRIN) 12 % cream Apply topically as needed for dry skin. 09/06/13  Yes Roma Schanz R, DO  ARIPiprazole (ABILIFY) 5 MG tablet Take 1 tablet by mouth daily. 11/18/14  Yes [provider]  calcium carbonate (  OS-CAL) 600 MG TABS Take 600 mg by mouth 2 (two) times daily with a meal.     Yes [provider]  cholecalciferol (VITAMIN D) 1000 UNITS tablet Take 3,000 Units by mouth daily.    Yes [provider]  Cyanocobalamin (B-12) 2500 MCG TABS Take 1 tablet by mouth daily.   Yes [provider]  docusate sodium (COLACE) 100 MG capsule Take 1 capsule (100 mg total) by mouth 2 (two) times daily. Patient taking differently: Take 100 mg by mouth daily as needed for mild constipation.  03/06/15  Yes Pete Pelt, PA-C  Glucosamine 500 MG TABS Take 1,000 mg by mouth daily.    Yes [provider]  hypromellose (GENTEAL) 0.3 % GEL ophthalmic ointment Place 1 application into both eyes at bedtime.   Yes [provider]   levothyroxine (SYNTHROID, LEVOTHROID) 88 MCG tablet Take 1 tablet (88 mcg total) by mouth daily. 08/24/18  Yes Roma Schanz R, DO  Lutein 40 MG CAPS Take 20 mg by mouth daily.    Yes [provider]  Melatonin 5 MG TABS Take 15 mg by mouth at bedtime.   Yes [provider]  multivitamin [facility]) per tablet Take 1 tablet by mouth daily.     Yes [provider]  zolpidem (AMBIEN) 10 MG tablet Take 10 mg by mouth at bedtime.    Yes [provider]  levocetirizine (XYZAL) 5 MG tablet Take 1 tablet (5 mg total) by mouth every evening. Patient not taking: Reported on 11/18/2018 11/22/16   Carollee Herter, Alferd Apa, DO  ranitidine (ZANTAC) 150 MG tablet Take 1 tablet (150 mg total) by mouth 2 (two) times daily. Patient not taking: Reported on 11/18/2018 03/25/17   Carollee Herter, Alferd Apa, DO  tiZANidine (ZANAFLEX) 2 MG tablet TAKE 1/2 TO 1 TABLET (1-2 MG TOTAL) BY MOUTH AT BEDTIME AS NEEDED FOR MUSCLE SPASMS. Patient not taking: Reported on 11/18/2018 09/22/18   Ann Held, DO   Physical Exam: Vitals:   11/18/18 1422 11/18/18 1545 11/18/18 1600 11/18/18 1611  BP:  (!) 205/110 (!) 235/134 (!) 238/123  Pulse:  82 75 80  Resp:  (!) 26 16 16   Temp: 98.6 F (37 C)     TempSrc: Oral     SpO2:  99% 98% 98%     General exam: Moderately built and nourished patient, lying comfortably supine on the gurney in no obvious distress.  Head, eyes and ENT: Nontraumatic and normocephalic. Pupils equally reacting to light and accommodation. Oral mucosa moist.  Neck: Supple. No JVD, carotid bruit or thyromegaly.  Lymphatics: No lymphadenopathy.  Respiratory system: Clear to auscultation. No increased work of breathing.  Cardiovascular system: S1 and S2 heard, RRR. No JVD, murmurs, gallops, clicks or pedal edema.  Gastrointestinal system: Abdomen is nondistended, soft and nontender. Normal bowel sounds heard. No organomegaly or masses appreciated.  Central nervous  system: Alert and oriented.  Cranial nerves intact, speech clear, no vision changes, left side 5/5.  Right arm with mild drift, 4/5.  Right lower extremity; patient is able to raise her leg (3-4/5) but is weaker than the left,  Extremities:  Peripheral pulses symmetrically felt.   Skin: No rashes or acute findings.  Musculoskeletal system: Negative exam.  Psychiatry: Pleasant and cooperative.   Labs on Admission:  Basic Metabolic Panel: Recent Labs  Lab 11/18/18 1416  NA 143  K 3.5  CL 109  CO2 25  GLUCOSE 99  BUN 16  CREATININE 0.90  CALCIUM 9.2   Liver Function Tests: Recent Labs  Lab 11/18/18 1416  AST 26  ALT 20  ALKPHOS 25*  BILITOT 0.7  PROT 7.7  ALBUMIN 4.4   No results for input(s): LIPASE, AMYLASE in the last 168 hours. No results for input(s): AMMONIA in the last 168 hours. CBC: Recent Labs  Lab 11/18/18 1416  WBC 9.4  NEUTROABS 6.7  HGB 14.4  HCT 43.9  MCV 101.2*  PLT 254   Cardiac Enzymes: No results for input(s): CKTOTAL, CKMB, CKMBINDEX, TROPONINI in the last 168 hours.  BNP (last 3 results) No results for input(s): PROBNP in the last 8760 hours. CBG: Recent Labs  Lab 11/18/18 1454  GLUCAP 91    Radiological Exams on Admission: Ct Head Wo Contrast  Result Date: 11/18/2018 CLINICAL DATA:  85 year old female with acute RIGHT arm and leg weakness. EXAM: CT HEAD WITHOUT CONTRAST TECHNIQUE: Contiguous axial images were obtained from the base of the skull through the vertex without intravenous contrast. COMPARISON:  10/15/2014 CT FINDINGS: Brain: A small linear high LEFT frontal white matter hypodensity extends to the cortex and may represent an infarct of uncertain chronicity. No evidence of hemorrhage, hydrocephalus, extra-axial collection or mass lesion/mass effect. Chronic small-vessel white matter ischemic changes are noted. Vascular: Carotid atherosclerotic calcifications identified. Skull: No acute or suspicious bony abnormalities.  Sinuses/Orbits: No acute finding. Other: None IMPRESSION: 1. Small linear high LEFT frontal white matter hypodensity extending to the cortex-question infarct of uncertain chronicity. No hemorrhage. 2. Chronic small-vessel white matter ischemic changes. Electronically Signed   By: Margarette Canada M.D.   On: 11/18/2018 15:02    EKG: Independently reviewed.  Sinus rhythm  Assessment/Plan Principal Problem:   Stroke [facility]) Active Problems:   Hypothyroidism   Bipolar disorder ([place])   Stroke (cerebrum) (HCC)   1-Acute stroke; Patient present with right side weakness, CT head showed; Small linear high LEFT frontal white matter hypodensity extending to the cortex-question infarct of uncertain chronicity. No hemorrhage. Patient presented out of window period for TPA.. -Started on aspirin. -Passed a swallow evaluation. -Telemetry neuro was consulted.  Also Dr. Lorraine Lax was consulted.  To transfer patient to [facility] for further evaluation. -MRI, MRA, echo, carotid Doppler ordered. - neuro check.  -Check hemoglobin A1c, lipid panel.  2-Hypertension;  Permissive hypertension in the setting of acute acute stroke. IV hydralazine, IV labetalol ordered for systolic blood pressure more than 740 and diastolic blood pressure more than 120. He likely will require blood pressure medications prior to discharge.  3-Hypothyroidism; continue with synthroid.   4-: Continue; with Abilify.    DVT Prophylaxis: Lovenox Code Status: DNR Family Communication: Son at bedside was updated Disposition Plan: To [facility] to the stepdown unit for close monitoring of blood pressure and neuro exam.  Time spent: 75 minutes.   Elmarie Shiley MD [facility]   11/18/2018, 4:49 PM

## 2018-11-18 NOTE — ED Provider Notes (Signed)
[place] DEPT Provider Note   CSN: [id number] Arrival date & time: 11/18/18  1408     History   Chief Complaint Chief Complaint  Patient presents with  . Extremity Weakness    HPI [name] [name] is a 85 y.o. female.  HPI Patient reports she went to bed last night at 11 PM.  She reports she was well.  She reports she awakened this morning at 9 AM.  She was trying to go to the bathroom but ended up falling on the floor because her right leg would not hold her.  She reports she could tell she was weak in the right arm as well.  She denies any headache.  She did not call family until about 2 PM.  She reports that she has been well recently she has not felt ill.  She denies any medical problems.  She takes 3 melatonin and Ambien at bedtime.  At baseline she is independent, living alone. Past Medical History:  Diagnosis Date  . Anemia    h/o fibroids  . Bipolar disorder ([place])   . Depression    bipolar  . Eczema   . Hypothyroidism   . Insomnia   . Osteoarthritis   . Osteoporosis   . Psoriasis     Patient Active Problem List   Diagnosis Date Noted  . Myalgia 08/06/2018  . Left lower quadrant pain 08/29/2017  . Dyspepsia 03/25/2017  . Atherosclerosis of aorta ([place]) 09/27/2016  . COPD with acute bronchitis ([place]) 09/27/2016  . Acute blood loss anemia 03/16/2015  . Osteoarthritis of right knee 03/04/2015  . Status post total right knee replacement 03/04/2015  . Contusion of knee, right 10/23/2014  . IBS 02/23/2010  . HEMORRHOIDS, EXTERNAL 01/01/2010  . HEMATURIA UNSPECIFIED 11/04/2009  . NAUSEA 06/02/2009  . URINARY INCONTINENCE, STRESS, FEMALE 05/09/2009  . Acute sinusitis 11/25/2008  . UTI 04/23/2008  . COLONIC POLYPS 12/21/2007  . Bipolar disorder ([place]) 12/21/2007  . DEVIATED NASAL SEPTUM 12/21/2007  . HERNIA, VENTRAL 12/21/2007  . Osteoarthritis 12/21/2007  . Hypothyroidism 09/01/2007  . ECZEMA 09/01/2007  . Psoriasis 09/01/2007  .  Osteoporosis 09/01/2007  . Insomnia 09/01/2007    Past Surgical History:  Procedure Laterality Date  . ABDOMINAL HYSTERECTOMY  1982  . APPENDECTOMY  2003  . BREAST LUMPECTOMY  1947   left  . CATARACT EXTRACTION  2010  . EYE SURGERY    . HERNIA REPAIR    . KNEE ARTHROSCOPY  09/07/11   right knee  . NASAL SEPTUM SURGERY  1970's  . TONSILLECTOMY  1970's  . TONSILLECTOMY    . TOTAL KNEE ARTHROPLASTY Right 03/04/2015   Procedure: RIGHT TOTAL KNEE ARTHROPLASTY;  Surgeon: Mcarthur Rossetti, MD;  Location: [place];  Service: Orthopedics;  Laterality: Right;  . VENTRAL HERNIA REPAIR  2005     OB History   No obstetric history on file.      Home Medications    Prior to Admission medications   Medication Sig Start Date End Date Taking? Authorizing Provider  ammonium lactate (LAC-HYDRIN) 12 % cream Apply topically as needed for dry skin. 09/06/13   Roma Schanz R, DO  ARIPiprazole (ABILIFY) 5 MG tablet Take 1 tablet by mouth daily. 11/18/14   [provider]  b complex vitamins tablet Take 2 tablets by mouth daily.    [provider]  calcium carbonate (OS-CAL) 600 MG TABS Take 600 mg by mouth 2 (two) times daily with a meal.  [provider]  cholecalciferol (VITAMIN D) 1000 UNITS tablet Take 3,000 Units by mouth daily.     [provider]  Cyanocobalamin (B-12) 2500 MCG TABS Take 1 tablet by mouth daily.    [provider]  docusate sodium (COLACE) 100 MG capsule Take 1 capsule (100 mg total) by mouth 2 (two) times daily. 03/06/15   Pete Pelt, PA-C  Glucosamine 500 MG TABS Take 1,000 mg by mouth daily.     [provider]  hypromellose (GENTEAL) 0.3 % GEL ophthalmic ointment Place 1 application into both eyes at bedtime.    [provider]  levocetirizine (XYZAL) 5 MG tablet Take 1 tablet (5 mg total) by mouth every evening. 11/22/16   Ann Held, DO  levothyroxine (SYNTHROID, LEVOTHROID) 88 MCG  tablet Take 1 tablet (88 mcg total) by mouth daily. 08/24/18   Roma Schanz R, DO  Lutein 40 MG CAPS Take 20 mg by mouth daily.     [provider]  multivitamin [facility]) per tablet Take 1 tablet by mouth daily.      [provider]  ranitidine (ZANTAC) 150 MG tablet Take 1 tablet (150 mg total) by mouth 2 (two) times daily. 03/25/17   Carollee Herter, Yvonne R, DO  tiZANidine (ZANAFLEX) 2 MG tablet TAKE 1/2 TO 1 TABLET (1-2 MG TOTAL) BY MOUTH AT BEDTIME AS NEEDED FOR MUSCLE SPASMS. 09/22/18   Ann Held, DO  valACYclovir (VALTREX) 500 MG tablet  10/04/18   [provider]  zolpidem (AMBIEN) 10 MG tablet Take 10 mg by mouth at bedtime.     [provider]    Family History Family History  Problem Relation Age of Onset  . Colon cancer Brother   . Lung cancer Father   . Prostate cancer Father   . Arthritis Father   . Cancer Father        lung, prostate  . Heart disease Brother   . Cancer Brother        lung  . Lung cancer Brother   . Aortic aneurysm Brother   . Cancer Other        colon    Social History Social History   Tobacco Use  . Smoking status: Former Smoker    Last attempt to quit: 09/29/1985    Years since quitting: 33.1  . Smokeless tobacco: Never Used  Substance Use Topics  . Alcohol use: No  . Drug use: No     Allergies   Bupropion hcl   Review of Systems Review of Systems 10 Systems reviewed and are negative for acute change except as noted in the HPI.   Physical Exam Updated Vital Signs BP (!) 210/102   Pulse 79   Temp 98.6 F (37 C) (Oral)   Resp 12   SpO2 99%   Physical Exam Constitutional:      Appearance: She is well-developed.     Comments: Patient is alert and interactive.  No respiratory distress.  Clinically well in appearance.  HENT:     Head: Normocephalic and atraumatic.     Nose: Nose normal.     Mouth/Throat:     Mouth: Mucous membranes are moist.  Eyes:     Extraocular  Movements: Extraocular movements intact.     Pupils: Pupils are equal, round, and reactive to light.  Neck:     Musculoskeletal: Neck supple.  Cardiovascular:     Rate and Rhythm: Normal rate and regular rhythm.  Heart sounds: Normal heart sounds.  Pulmonary:     Effort: Pulmonary effort is normal.     Breath sounds: Normal breath sounds.  Abdominal:     General: Bowel sounds are normal. There is no distension.     Palpations: Abdomen is soft.     Tenderness: There is no abdominal tenderness.  Musculoskeletal: Normal range of motion.  Skin:    General: Skin is warm and dry.  Neurological:     Mental Status: She is alert and oriented to person, place, and time.     GCS: GCS eye subscore is 4. GCS verbal subscore is 5. GCS motor subscore is 6.     Coordination: Coordination normal.     Comments: No evident cranial nerve deficits.  Patient has slightly decreased grip strength on the right compared to the left.  Patient has mild pronator drift on the right.  Patient can elevate the right lower extremity off of the bed but is weak relative to the left.  Sensation intact to light touch.  Cognitive function intact.  No aphasia.  No visual field cut.  Psychiatric:        Mood and Affect: Mood normal.      ED Treatments / Results  Labs (all labs ordered are listed, but only abnormal results are displayed) Labs Reviewed  CBC - Abnormal; Notable for the following components:      Result Value   MCV 101.2 (*)    All other components within normal limits  PROTIME-INR  APTT  DIFFERENTIAL  COMPREHENSIVE METABOLIC PANEL  I-STAT TROPONIN, ED  CBG MONITORING, ED    EKG EKG Interpretation  Date/Time:  Saturday November 18 2018 14:12:59 EST Ventricular Rate:  79 PR Interval:    QRS Duration: 92 QT Interval:  400 QTC Calculation: 459 R Axis:   37 Text Interpretation:  Sinus rhythm Anterior infarct, old poor r wave progression. no acute ischemic changes. P wave inversion compared  to previous Confirmed by Charlesetta Shanks [phone]) on 11/18/2018 3:28:21 PM   Radiology Ct Head Wo Contrast  Result Date: 11/18/2018 CLINICAL DATA:  85 year old female with acute RIGHT arm and leg weakness. EXAM: CT HEAD WITHOUT CONTRAST TECHNIQUE: Contiguous axial images were obtained from the base of the skull through the vertex without intravenous contrast. COMPARISON:  10/15/2014 CT FINDINGS: Brain: A small linear high LEFT frontal white matter hypodensity extends to the cortex and may represent an infarct of uncertain chronicity. No evidence of hemorrhage, hydrocephalus, extra-axial collection or mass lesion/mass effect. Chronic small-vessel white matter ischemic changes are noted. Vascular: Carotid atherosclerotic calcifications identified. Skull: No acute or suspicious bony abnormalities. Sinuses/Orbits: No acute finding. Other: None IMPRESSION: 1. Small linear high LEFT frontal white matter hypodensity extending to the cortex-question infarct of uncertain chronicity. No hemorrhage. 2. Chronic small-vessel white matter ischemic changes. Electronically Signed   By: Margarette Canada M.D.   On: 11/18/2018 15:02    Procedures Procedures (including critical care time)  Medications Ordered in ED Medications  sodium chloride flush (NS) 0.9 % injection 3 mL (3 mLs Intravenous Given 11/18/18 1502)  Consult: Tele-neurology has evaluated the patient.  Consult on the chart. Consult: Reviewed with neurology Dr. Lorraine Lax.  Admit to hospitalist service at [facility] neurology will see in the morning.  Initial Impression / Assessment and Plan / ED Course  I have reviewed the triage vital signs and the nursing notes.  Pertinent labs & imaging results that were available during my care of the patient were  reviewed by me and considered in my medical decision making (see chart for details).    Patient presents as outlined with right-sided deficit that was present upon awakening this morning.  Patient is alert  with clear mental status.  Speech is clear no airway impingement.  No headache.  Patient is not on anticoagulants.  Plan for admission.  Final Clinical Impressions(s) / ED Diagnoses   Final diagnoses:  Cerebrovascular accident (CVA), unspecified mechanism [facility])    ED Discharge Orders    None       Charlesetta Shanks, MD 11/18/18 1538

## 2018-11-18 NOTE — ED Notes (Signed)
Stat code stroke consult called @ 14:19.

## 2018-11-19 ENCOUNTER — Observation Stay (HOSPITAL_COMMUNITY): Payer: Medicare Other

## 2018-11-19 DIAGNOSIS — M1990 Unspecified osteoarthritis, unspecified site: Secondary | ICD-10-CM | POA: Diagnosis present

## 2018-11-19 DIAGNOSIS — Z90710 Acquired absence of both cervix and uterus: Secondary | ICD-10-CM | POA: Diagnosis not present

## 2018-11-19 DIAGNOSIS — F319 Bipolar disorder, unspecified: Secondary | ICD-10-CM | POA: Diagnosis present

## 2018-11-19 DIAGNOSIS — E039 Hypothyroidism, unspecified: Secondary | ICD-10-CM

## 2018-11-19 DIAGNOSIS — Z87891 Personal history of nicotine dependence: Secondary | ICD-10-CM | POA: Diagnosis not present

## 2018-11-19 DIAGNOSIS — Z888 Allergy status to other drugs, medicaments and biological substances status: Secondary | ICD-10-CM | POA: Diagnosis not present

## 2018-11-19 DIAGNOSIS — I340 Nonrheumatic mitral (valve) insufficiency: Secondary | ICD-10-CM | POA: Diagnosis not present

## 2018-11-19 DIAGNOSIS — I169 Hypertensive crisis, unspecified: Secondary | ICD-10-CM | POA: Diagnosis not present

## 2018-11-19 DIAGNOSIS — I639 Cerebral infarction, unspecified: Secondary | ICD-10-CM | POA: Diagnosis not present

## 2018-11-19 DIAGNOSIS — G459 Transient cerebral ischemic attack, unspecified: Secondary | ICD-10-CM | POA: Diagnosis not present

## 2018-11-19 DIAGNOSIS — Z8261 Family history of arthritis: Secondary | ICD-10-CM | POA: Diagnosis not present

## 2018-11-19 DIAGNOSIS — E876 Hypokalemia: Secondary | ICD-10-CM | POA: Diagnosis not present

## 2018-11-19 DIAGNOSIS — Z79899 Other long term (current) drug therapy: Secondary | ICD-10-CM | POA: Diagnosis not present

## 2018-11-19 DIAGNOSIS — Z79890 Hormone replacement therapy (postmenopausal): Secondary | ICD-10-CM | POA: Diagnosis not present

## 2018-11-19 DIAGNOSIS — Z66 Do not resuscitate: Secondary | ICD-10-CM | POA: Diagnosis present

## 2018-11-19 DIAGNOSIS — Z800 Family history of malignant neoplasm of digestive organs: Secondary | ICD-10-CM | POA: Diagnosis not present

## 2018-11-19 DIAGNOSIS — I63532 Cerebral infarction due to unspecified occlusion or stenosis of left posterior cerebral artery: Secondary | ICD-10-CM | POA: Diagnosis not present

## 2018-11-19 DIAGNOSIS — I6389 Other cerebral infarction: Secondary | ICD-10-CM | POA: Diagnosis not present

## 2018-11-19 DIAGNOSIS — Z801 Family history of malignant neoplasm of trachea, bronchus and lung: Secondary | ICD-10-CM | POA: Diagnosis not present

## 2018-11-19 DIAGNOSIS — Z8673 Personal history of transient ischemic attack (TIA), and cerebral infarction without residual deficits: Secondary | ICD-10-CM | POA: Diagnosis present

## 2018-11-19 DIAGNOSIS — I6300 Cerebral infarction due to thrombosis of unspecified precerebral artery: Secondary | ICD-10-CM | POA: Diagnosis not present

## 2018-11-19 DIAGNOSIS — R29703 NIHSS score 3: Secondary | ICD-10-CM | POA: Diagnosis present

## 2018-11-19 DIAGNOSIS — Z96651 Presence of right artificial knee joint: Secondary | ICD-10-CM | POA: Diagnosis present

## 2018-11-19 DIAGNOSIS — F5101 Primary insomnia: Secondary | ICD-10-CM | POA: Diagnosis not present

## 2018-11-19 DIAGNOSIS — F5104 Psychophysiologic insomnia: Secondary | ICD-10-CM | POA: Diagnosis present

## 2018-11-19 DIAGNOSIS — I4891 Unspecified atrial fibrillation: Secondary | ICD-10-CM | POA: Diagnosis not present

## 2018-11-19 DIAGNOSIS — I63512 Cerebral infarction due to unspecified occlusion or stenosis of left middle cerebral artery: Secondary | ICD-10-CM | POA: Diagnosis not present

## 2018-11-19 DIAGNOSIS — G8191 Hemiplegia, unspecified affecting right dominant side: Secondary | ICD-10-CM | POA: Diagnosis present

## 2018-11-19 DIAGNOSIS — R0989 Other specified symptoms and signs involving the circulatory and respiratory systems: Secondary | ICD-10-CM | POA: Diagnosis not present

## 2018-11-19 DIAGNOSIS — I63412 Cerebral infarction due to embolism of left middle cerebral artery: Secondary | ICD-10-CM | POA: Diagnosis not present

## 2018-11-19 DIAGNOSIS — E785 Hyperlipidemia, unspecified: Secondary | ICD-10-CM | POA: Diagnosis present

## 2018-11-19 DIAGNOSIS — Z8249 Family history of ischemic heart disease and other diseases of the circulatory system: Secondary | ICD-10-CM | POA: Diagnosis not present

## 2018-11-19 DIAGNOSIS — R531 Weakness: Secondary | ICD-10-CM | POA: Diagnosis not present

## 2018-11-19 DIAGNOSIS — I69351 Hemiplegia and hemiparesis following cerebral infarction affecting right dominant side: Secondary | ICD-10-CM | POA: Diagnosis present

## 2018-11-19 DIAGNOSIS — F3170 Bipolar disorder, currently in remission, most recent episode unspecified: Secondary | ICD-10-CM

## 2018-11-19 DIAGNOSIS — I10 Essential (primary) hypertension: Secondary | ICD-10-CM | POA: Diagnosis present

## 2018-11-19 DIAGNOSIS — Z8042 Family history of malignant neoplasm of prostate: Secondary | ICD-10-CM | POA: Diagnosis not present

## 2018-11-19 DIAGNOSIS — L409 Psoriasis, unspecified: Secondary | ICD-10-CM | POA: Diagnosis present

## 2018-11-19 DIAGNOSIS — R609 Edema, unspecified: Secondary | ICD-10-CM | POA: Diagnosis not present

## 2018-11-19 DIAGNOSIS — M810 Age-related osteoporosis without current pathological fracture: Secondary | ICD-10-CM | POA: Diagnosis present

## 2018-11-19 HISTORY — DX: Personal history of transient ischemic attack (TIA), and cerebral infarction without residual deficits: Z86.73

## 2018-11-19 LAB — LIPID PANEL
Cholesterol: 191 mg/dL (ref 0–200)
HDL: 54 mg/dL (ref >40)
LDL Cholesterol: 126 mg/dL — ABNORMAL HIGH (ref 0–99)
Total CHOL/HDL Ratio: 3.5 RATIO
Triglycerides: 57 mg/dL (ref <150)
VLDL: 11 mg/dL (ref 0–40)

## 2018-11-19 LAB — HEMOGLOBIN A1C
Hgb A1c MFr Bld: 5.2 % (ref 4.8–5.6)
Mean Plasma Glucose: 102.54 mg/dL

## 2018-11-19 LAB — ECHOCARDIOGRAM COMPLETE

## 2018-11-19 MED ORDER — ZOLPIDEM TARTRATE 5 MG PO TABS
5.0000 mg | ORAL_TABLET | Freq: Once | ORAL | Status: AC
  Administered 2018-11-19: 5 mg via ORAL
  Filled 2018-11-19: qty 1

## 2018-11-19 MED ORDER — ATORVASTATIN CALCIUM 40 MG PO TABS
40.0000 mg | ORAL_TABLET | Freq: Every day | ORAL | Status: DC
  Administered 2018-11-19: 40 mg via ORAL
  Filled 2018-11-19: qty 1

## 2018-11-19 NOTE — Progress Notes (Signed)
PROGRESS NOTE    [name] [name]  SNK:[id number] DOB: 03/17/1933 DOA: 11/18/2018 PCP: Ann Held, DO      Brief Narrative:  [name] [name] is a 85 y.o. F with hx HTN untreated, hypothyroidism and Bipolar disorder who presents with right sided weakness.  Patient was in [place] until day of admission, woke and collapsed because her right leg gave out, soon became obvious she had right sided hemiplegia.  In ER, CT head showed scattered infarct.  Neurology evaluated, not candidate for tPA given deficits not suggesting LVO.      Assessment & Plan:  Acute embolic stroke -Non-invasive angiography showed moderate intracranial atherosclerosis -Echocardiogram pending -Carotid imaging pending -Lipids ordered: LDL greater than 100, started on atorvastatin 40 -Aspirin ordered at admission   -Atrial fibrillation: Not present on admission no history of -tPA not given because symptoms minor -Dysphagia screen ordered in ER -PT eval ordered: recommended CIR -Smoking cessation: Non-smoker   Hypertension Patient with no personal history of hypertension.  However chart review shows that she has had blood pressure in the 140s on several occasions in the past.  At admission her blood pressure was greater than 210/110. -Permissive hypertension today -PRN hydralazine or labetalol for blood pressure greater than 220/120  Hypothyroidism TSH normal -Continue levothyroxine  Bipolar disorder -Continue Abilify    MDM and disposition: The below labs and imaging reports were reviewed and summarized above.  Medication management as above.  The patient was admitted with right-sided hemiplegia, found to have multifocal cortical infarcts.  Likely embolic.  We will continue secondary work-up for stroke, investigate inpatient rehab, pursue evaluation of cryptogenic embolic stroke.       DVT prophylaxis: Lovenox Code Status: DO NOT RESUSCITATE Family Communication: son at the  bedside    Consultants:   Neurology  Procedures:   Echocardiogram  Carotid ultrasound  Antimicrobials:   None   Subjective: She has persistent right-sided weakness, inability to stand.  She has no speech disturbance, visual change, seizures, loss of consciousness, headache.  No fever, cough, dysuria.  Objective: Vitals:   11/19/18 0004 11/19/18 0204 11/19/18 0403 11/19/18 0802  BP: (!) 178/73 (!) 161/80 (!) 155/78 (!) 167/70  Pulse: 73 75 72 79  Resp: 14 13 16 18   Temp: 97.6 F (36.4 C) 98.5 F (36.9 C) 98.7 F (37.1 C) 98.9 F (37.2 C)  TempSrc: Oral Oral Oral Oral  SpO2: 97% 97% 96% 95%    Intake/Output Summary (Last 24 hours) at 11/19/2018 1109 Last data filed at 11/19/2018 0400 Gross per 24 hour  Intake -  Output 300 ml  Net -300 ml   There were no vitals filed for this visit.  Examination: General appearance:  adult female, alert and in no acute distress.   HEENT: Anicteric, conjunctiva pink, lids and lashes normal. No nasal deformity, discharge, epistaxis.  Lips moist, dentition poor, oropharynx moist, no oral lesions, hearing normal.   Skin: Warm and dry.  No jaundice.  No suspicious rashes or lesions. Cardiac: RRR, nl S1-S2, no murmurs appreciated.  Capillary refill is brisk.  JVP normal.  No LE edema.  Radial pulses 2+ and symmetric. Respiratory: Normal respiratory rate and rhythm.  CTAB without rales or wheezes. Abdomen: Abdomen soft.  no TTP. No ascites, distension, hepatosplenomegaly.   MSK: No deformities or effusions. Neuro: Awake and alert.  EOMI, right-sided weakness, 4/5 in upper and lower extremities.  5/5 in left upper and lower extremities.   Marland Kitchen Speech fluent.  Cranial nerves III through  XII intact. Psych: Sensorium intact and responding to questions, attention normal. Affect normal.  Judgment and insight appear normal.    Data Reviewed: I have personally reviewed following labs and imaging studies:  CBC: Recent Labs  Lab 11/18/18 1416   WBC 9.4  NEUTROABS 6.7  HGB 14.4  HCT 43.9  MCV 101.2*  PLT 144   Basic Metabolic Panel: Recent Labs  Lab 11/18/18 1416  NA 143  K 3.5  CL 109  CO2 25  GLUCOSE 99  BUN 16  CREATININE 0.90  CALCIUM 9.2   GFR: CrCl cannot be calculated (Unknown ideal weight.). Liver Function Tests: Recent Labs  Lab 11/18/18 1416  AST 26  ALT 20  ALKPHOS 25*  BILITOT 0.7  PROT 7.7  ALBUMIN 4.4   No results for input(s): LIPASE, AMYLASE in the last 168 hours. No results for input(s): AMMONIA in the last 168 hours. Coagulation Profile: Recent Labs  Lab 11/18/18 1416  INR 0.90   Cardiac Enzymes: No results for input(s): CKTOTAL, CKMB, CKMBINDEX, TROPONINI in the last 168 hours. BNP (last 3 results) No results for input(s): PROBNP in the last 8760 hours. HbA1C: Recent Labs    11/19/18 0535  HGBA1C 5.2   CBG: Recent Labs  Lab 11/18/18 1454  GLUCAP 91   Lipid Profile: Recent Labs    11/19/18 0535  CHOL 191  HDL 54  LDLCALC 126*  TRIG 57  CHOLHDL 3.5   Thyroid Function Tests: No results for input(s): TSH, T4TOTAL, FREET4, T3FREE, THYROIDAB in the last 72 hours. Anemia Panel: No results for input(s): VITAMINB12, FOLATE, FERRITIN, TIBC, IRON, RETICCTPCT in the last 72 hours. Urine analysis:    Component Value Date/Time   COLORURINE YELLOW 03/22/2017 [place] 03/22/2017 1607   LABSPEC 1.020 03/22/2017 1607   PHURINE 5.5 03/22/2017 1607   GLUCOSEU NEGATIVE 03/22/2017 1607   HGBUR NEGATIVE 03/22/2017 1607   HGBUR moderate 01/04/2011 1323   BILIRUBINUR neg 08/29/2017 1221   KETONESUR TRACE (A) 03/22/2017 1607   PROTEINUR neg 08/29/2017 1221   UROBILINOGEN 0.2 08/29/2017 1221   UROBILINOGEN 0.2 03/22/2017 1607   NITRITE neg 08/29/2017 1221   NITRITE NEGATIVE 03/22/2017 1607   LEUKOCYTESUR Negative 08/29/2017 1221   Sepsis Labs: @LABRCNTIP (procalcitonin:4,lacticacidven:4)  )No results found for this or any previous visit (from the past 240  hour(s)).       Radiology Studies: Dg Chest 2 View  Result Date: 11/18/2018 CLINICAL DATA:  Pt had a stroke last night and awoke this AM with right side deficits including numbness in the right upper extremity and weakness in the right lower extremity. Pt denies chest pain, SOB, and headache. EXAM: CHEST - 2 VIEW COMPARISON:  12/09/2017 FINDINGS: Cardiac silhouette is normal in size. No mediastinal or hilar masses. No evidence of adenopathy stable aortic atherosclerosis. Lungs are hyperexpanded but clear. No pleural effusion or pneumothorax. Skeletal structures are demineralized but intact. IMPRESSION: No acute cardiopulmonary disease. Electronically Signed   By: Lajean Manes M.D.   On: 11/18/2018 19:46   Ct Head Wo Contrast  Result Date: 11/18/2018 CLINICAL DATA:  85 year old female with acute RIGHT arm and leg weakness. EXAM: CT HEAD WITHOUT CONTRAST TECHNIQUE: Contiguous axial images were obtained from the base of the skull through the vertex without intravenous contrast. COMPARISON:  10/15/2014 CT FINDINGS: Brain: A small linear high LEFT frontal white matter hypodensity extends to the cortex and may represent an infarct of uncertain chronicity. No evidence of hemorrhage, hydrocephalus, extra-axial collection or mass lesion/mass  effect. Chronic small-vessel white matter ischemic changes are noted. Vascular: Carotid atherosclerotic calcifications identified. Skull: No acute or suspicious bony abnormalities. Sinuses/Orbits: No acute finding. Other: None IMPRESSION: 1. Small linear high LEFT frontal white matter hypodensity extending to the cortex-question infarct of uncertain chronicity. No hemorrhage. 2. Chronic small-vessel white matter ischemic changes. Electronically Signed   By: Margarette Canada M.D.   On: 11/18/2018 15:02   Mr Brain Wo Contrast  Result Date: 11/18/2018 CLINICAL DATA:  85 year old female with right extremity weakness on waking today. EXAM: MRI HEAD WITHOUT CONTRAST MRA HEAD WITHOUT  CONTRAST TECHNIQUE: Multiplanar, multiecho pulse sequences of the brain and surrounding structures were obtained without intravenous contrast. Angiographic images of the head were obtained using MRA technique without contrast. COMPARISON:  Head CT without contrast earlier today. FINDINGS: MRI HEAD FINDINGS Brain: Curvilinear restricted diffusion in the posterior left corona radiata tracking to the posterior lentiform. There also is 2 more subtle areas of subcortical white matter restricted diffusion in the posterosuperior left frontal lobe (near the motor strip) and in the right parietal lobe (series 5, image 77). Mild associated T2 and FLAIR hyperintensity with no associated hemorrhage or mass effect. No contralateral right hemisphere or posterior fossa restricted diffusion. Patchy bilateral cerebral white matter T2 and FLAIR hyperintensity, which in some areas outside of the above findings also resembles (chronic) small vessel infarcts. Occasional chronic micro hemorrhages. Mild to moderate for age T2 heterogeneity in the deep gray matter nuclei. The brainstem and cerebellum are relatively spared. No midline shift, mass effect, evidence of mass lesion, ventriculomegaly, extra-axial collection or acute intracranial hemorrhage. Cervicomedullary junction and pituitary are within normal limits. Vascular: Major intracranial vascular flow voids are preserved. Mild generalized intracranial artery tortuosity. The distal left vertebral artery appears dominant. Skull and upper cervical spine: Visualized bone marrow signal is within normal limits. Negative for age visible cervical spine. Sinuses/Orbits: Postoperative changes to both globes, otherwise negative. Other: Trace right mastoid fluid. Negative scalp and face soft tissues. MRA HEAD FINDINGS Antegrade flow in the posterior circulation with dominant distal left vertebral artery. No distal vertebral stenosis. The left PICA origin is patent. Patent basilar artery without  stenosis. Normal SCA and PCA origins. Posterior communicating arteries are diminutive or absent. Bilateral PCA branches are mildly irregular. Antegrade flow in both ICA siphons. Mild siphon irregularity. No siphon stenosis. Patent carotid termini, MCA and ACA origins. Anterior communicating artery is diminutive or absent. There is a moderate to severe stenosis of the left ACA distal A2 segment (series 1030 image 6) but preserved bilateral distal ACA flow signal. The left MCA M1 segment bifurcates early. There is mild to moderate irregularity and stenosis in the proximal anterior M2 branch. Other visible left MCA branches are within normal limits. Right MCA M1, bifurcation, and visible right MCA branches are within normal limits. IMPRESSION: 1. Acute lacunar infarct of the posterior Left corona radiata and posterior lentiform, with two small superimposed small acute white matter infarcts in the posterior Left MCA territory, including near the left motor strip. No associated hemorrhage or mass effect. 2. Intracranial MRA is negative for large vessel occlusion but positive for moderate stenoses of the both the distal Left A2 and Left anterior M2 branches. 3. Underlying moderate chronic small vessel disease. Electronically Signed   By: Genevie Ann M.D.   On: 11/18/2018 22:10   Mr Virgel Paling VZ Contrast  Result Date: 11/18/2018 CLINICAL DATA:  85 year old female with right extremity weakness on waking today. EXAM: MRI HEAD WITHOUT CONTRAST MRA  HEAD WITHOUT CONTRAST TECHNIQUE: Multiplanar, multiecho pulse sequences of the brain and surrounding structures were obtained without intravenous contrast. Angiographic images of the head were obtained using MRA technique without contrast. COMPARISON:  Head CT without contrast earlier today. FINDINGS: MRI HEAD FINDINGS Brain: Curvilinear restricted diffusion in the posterior left corona radiata tracking to the posterior lentiform. There also is 2 more subtle areas of subcortical white  matter restricted diffusion in the posterosuperior left frontal lobe (near the motor strip) and in the right parietal lobe (series 5, image 77). Mild associated T2 and FLAIR hyperintensity with no associated hemorrhage or mass effect. No contralateral right hemisphere or posterior fossa restricted diffusion. Patchy bilateral cerebral white matter T2 and FLAIR hyperintensity, which in some areas outside of the above findings also resembles (chronic) small vessel infarcts. Occasional chronic micro hemorrhages. Mild to moderate for age T2 heterogeneity in the deep gray matter nuclei. The brainstem and cerebellum are relatively spared. No midline shift, mass effect, evidence of mass lesion, ventriculomegaly, extra-axial collection or acute intracranial hemorrhage. Cervicomedullary junction and pituitary are within normal limits. Vascular: Major intracranial vascular flow voids are preserved. Mild generalized intracranial artery tortuosity. The distal left vertebral artery appears dominant. Skull and upper cervical spine: Visualized bone marrow signal is within normal limits. Negative for age visible cervical spine. Sinuses/Orbits: Postoperative changes to both globes, otherwise negative. Other: Trace right mastoid fluid. Negative scalp and face soft tissues. MRA HEAD FINDINGS Antegrade flow in the posterior circulation with dominant distal left vertebral artery. No distal vertebral stenosis. The left PICA origin is patent. Patent basilar artery without stenosis. Normal SCA and PCA origins. Posterior communicating arteries are diminutive or absent. Bilateral PCA branches are mildly irregular. Antegrade flow in both ICA siphons. Mild siphon irregularity. No siphon stenosis. Patent carotid termini, MCA and ACA origins. Anterior communicating artery is diminutive or absent. There is a moderate to severe stenosis of the left ACA distal A2 segment (series 1030 image 6) but preserved bilateral distal ACA flow signal. The left  MCA M1 segment bifurcates early. There is mild to moderate irregularity and stenosis in the proximal anterior M2 branch. Other visible left MCA branches are within normal limits. Right MCA M1, bifurcation, and visible right MCA branches are within normal limits. IMPRESSION: 1. Acute lacunar infarct of the posterior Left corona radiata and posterior lentiform, with two small superimposed small acute white matter infarcts in the posterior Left MCA territory, including near the left motor strip. No associated hemorrhage or mass effect. 2. Intracranial MRA is negative for large vessel occlusion but positive for moderate stenoses of the both the distal Left A2 and Left anterior M2 branches. 3. Underlying moderate chronic small vessel disease. Electronically Signed   By: Genevie Ann M.D.   On: 11/18/2018 22:10        Scheduled Meds: .  stroke: mapping our early stages of recovery book   Does not apply Once  . ARIPiprazole  5 mg Oral Daily  . aspirin  300 mg Rectal Daily   Or  . aspirin  325 mg Oral Daily  . enoxaparin (LOVENOX) injection  40 mg Subcutaneous Q24H  . levothyroxine  88 mcg Oral Daily  . Melatonin  15 mg Oral QHS   Continuous Infusions: . sodium chloride 50 mL/hr at 11/18/18 1817     LOS: 0 days    Time spent: 35 minutes    Edwin Dada, MD [facility] 11/19/2018, 11:09 AM     Please page through [facility]:  [email or web address] Password [email or web address] If 7PM-7AM, please contact night-coverage

## 2018-11-19 NOTE — Progress Notes (Signed)
  Echocardiogram 2D Echocardiogram has been performed.  Johny Chess 11/19/2018, 11:01 AM

## 2018-11-19 NOTE — Plan of Care (Cosign Needed)
Per Dr Jaynee Eagles -  sent message to cardiology to try to schedule pt for TEE and Loop Monday. Order written to be NPO after midnight except for meds with sips.  Mikey Bussing PA-C [facility] Pager [phone] 11/19/2018, 4:23 PM

## 2018-11-19 NOTE — Evaluation (Signed)
Occupational Therapy Evaluation Patient Details Name: [name] [name] MRN: [id number] DOB: May 18, 1933 Today's Date: 11/19/2018    History of Present Illness Patient is an 85 y/o female who presents with right sided weakness. NIH 3. Brain MRI- acute infarct posterior left corona radiata and posterior lentiform with two small superimposed small acute white matter infarcts in the posterior Left MCA territory. PMH includes depression, bipolar disorder.    Clinical Impression   Pt is an 85 yo female s/p above dx. Pt PTA: living alone with supportive family, independent with ADL and mobility, actively exercising; family providing transportation. Pt currently with R sided weakness and decreased coordination with lateral lean to R with functional mobility. Pt minA for UB ADL and ModA for LB ADL in sitting. Pt transfers with +1 and mobility requires +2 for safety and sequencing. Severe weakness in R side affecting ADLs, mobility and safety requiring continued OT skilled services in CIR setting. Pt alert and oriented x4.  OT to progress with RUE HEP and increasing independence with ADLs.    Follow Up Recommendations  CIR;Supervision/Assistance - 24 hour    Equipment Recommendations  None recommended by OT    Recommendations for Other Services Rehab consult     Precautions / Restrictions Precautions Precautions: Fall Precaution Comments: 2L O2 [place] Restrictions Weight Bearing Restrictions: No      Mobility Bed Mobility Overal bed mobility: Needs Assistance Bed Mobility: Supine to Sit     Supine to sit: HOB elevated;Min assist     General bed mobility comments: Increase time and assist scooting scooting right hip to EOB. Not using RUE for transfer.  Transfers Overall transfer level: Needs assistance Equipment used: Rolling walker (2 wheeled) Transfers: Sit to/from Stand Sit to Stand: Min assist;From elevated surface         General transfer comment: Sit to stand MinA; adding steps to  transfer is modA    Balance Overall balance assessment: Needs assistance Sitting-balance support: Feet supported;No upper extremity supported Sitting balance-Leahy Scale: Fair       Standing balance-Leahy Scale: Poor Standing balance comment: requires external support for standing balance- leans to R                           ADL either performed or assessed with clinical judgement   ADL Overall ADL's : Needs assistance/impaired Eating/Feeding: Set up;Sitting   Grooming: Minimal assistance;Sitting   Upper Body Bathing: Minimal assistance;Sitting   Lower Body Bathing: Moderate assistance;Sitting/lateral leans;Sit to/from stand   Upper Body Dressing : Minimal assistance;Sitting   Lower Body Dressing: Moderate assistance;Sitting/lateral leans;Sit to/from stand   Toilet Transfer: Moderate assistance;BSC   Toileting- Clothing Manipulation and Hygiene: Moderate assistance;Sitting/lateral lean;Sit to/from stand;Cueing for safety;Cueing for sequencing       Functional mobility during ADLs: Moderate assistance;Cueing for safety;Rolling walker(stand pivot transfer to [facility] and ambulating 5' to recliner ) General ADL Comments: minA UB ADL RUE was dominant hand; modA for LB bed level to start donning LB dressing and sit to stand to pull to waist with ModA     Vision Baseline Vision/History: No visual deficits Vision Assessment?: No apparent visual deficits     Perception     Praxis      Pertinent Vitals/Pain Pain Assessment: No/denies pain     Hand Dominance Right   Extremity/Trunk Assessment Upper Extremity Assessment Upper Extremity Assessment: Generalized weakness;RUE deficits/detail RUE Deficits / Details: Grip weak compared to LUE; impaired finger to nose testing, dysdiadochokinesia.  RUE Coordination: decreased fine motor;decreased gross motor(attempting finger opposition)   Lower Extremity Assessment Lower Extremity Assessment: Generalized weakness;RLE  deficits/detail RLE Deficits / Details: unable to fully perform dorsiflexion to lift foot off ground for proper gait pattern   Cervical / Trunk Assessment Cervical / Trunk Assessment: Normal   Communication Communication Communication: No difficulties   Cognition Arousal/Alertness: Awake/alert Behavior During Therapy: WFL for tasks assessed/performed Overall Cognitive Status: History of cognitive impairments - at baseline                                 General Comments: Some memory deficits per son.   General Comments  pt tolerating session well. Son is very supportive. Pt is very motivated per PLOF, she was independent in everything but driving.    Exercises     Shoulder Instructions      Home Living Family/patient expects to be discharged to:: Private residence Living Arrangements: Alone Available Help at Discharge: Family;Available PRN/intermittently Type of Home: House Home Access: Level entry     Home Layout: One level     Bathroom Shower/Tub: Occupational psychologist: Handicapped height     Home Equipment: Environmental consultant - 2 wheels;Shower seat;Bedside commode          Prior Functioning/Environment Level of Independence: Needs assistance  Gait / Transfers Assistance Needed: Walks independently. ADL's / Homemaking Assistance Needed: Does own ADLs. Son helps with grocery shopping/drives to appts.            OT Problem List: Decreased strength;Decreased activity tolerance;Impaired balance (sitting and/or standing);Decreased coordination;Decreased safety awareness;Pain;Impaired UE functional use      OT Treatment/Interventions: Self-care/ADL training;Therapeutic exercise;Energy conservation;Therapeutic activities;Balance training;Patient/family education;Neuromuscular education    OT Goals(Current goals can be found in the care plan section) Acute Rehab OT Goals Patient Stated Goal: to get back home and be independent OT Goal Formulation:  With patient Time For Goal Achievement: 12/03/18 Potential to Achieve Goals: Good ADL Goals Pt Will Perform Grooming: with modified independence;sitting Pt Will Perform Upper Body Dressing: with set-up;sitting Pt Will Perform Lower Body Dressing: with min assist;sit to/from stand Pt Will Transfer to Toilet: with min guard assist Pt Will Perform Toileting - Clothing Manipulation and hygiene: with min assist;sit to/from stand Pt/caregiver will Perform Home Exercise Program: Right Upper extremity;With Supervision Additional ADL Goal #1: Pt will increase to fair dynamic sitting balance tolerating x10 mins  OT Frequency: Min 3X/week   Barriers to D/C:            Co-evaluation              AM-PAC OT "6 Clicks" Daily Activity     Outcome Measure Help from another person eating meals?: A Little Help from another person taking care of personal grooming?: A Little Help from another person toileting, which includes using toliet, bedpan, or urinal?: A Lot Help from another person bathing (including washing, rinsing, drying)?: A Lot Help from another person to put on and taking off regular upper body clothing?: A Little Help from another person to put on and taking off regular lower body clothing?: A Lot 6 Click Score: 15   End of Session Equipment Utilized During Treatment: Gait belt;Rolling walker Nurse Communication: Mobility status  Activity Tolerance: Patient tolerated treatment well Patient left: in chair;with call bell/phone within reach;with chair alarm set  OT Visit Diagnosis: Unsteadiness on feet (R26.81);Muscle weakness (generalized) (M62.81);Hemiplegia and hemiparesis Hemiplegia - Right/Left: Right  Hemiplegia - dominant/non-dominant: Dominant Hemiplegia - caused by: Cerebral infarction                Time: 5927-6394 OT Time Calculation (min): 40 min Charges:  OT General Charges $OT Visit: 1 Visit OT Evaluation $OT Eval Moderate Complexity: 1 Mod OT Treatments $Self  Care/Home Management : 8-22 mins $Neuromuscular Re-education: 8-22 mins  Darryl Nestle) Marsa Aris OTR/L Acute Rehabilitation Services Pager: [phone] Office: [phone]   Fredda Hammed 11/19/2018, 4:02 PM

## 2018-11-19 NOTE — Progress Notes (Signed)
VASCULAR LAB PRELIMINARY  PRELIMINARY  PRELIMINARY  PRELIMINARY  Carotid duplex completed.    Preliminary report:  See CV Proc  Jasmin Trumbull, RVT 11/19/2018, 11:12 AM

## 2018-11-19 NOTE — Consult Note (Signed)
Referring Physician: Dr Loleta Books    Reason for Consult: Stroke  HPI: [name] [name] is an 85 y.o. female with a history of osteoporosis, hypothyroidism, anemia, and bipolar disorder who presented to the [facility] emergency department on 11/18/2018 after she awoke at approximately 9 AM with right-sided weakness. She was seen in consultation by tele-neurology, Dr. Ramon Dredge, who determined that the patient was not a candidate for TPA since she was more than 4.5  hours from the incident.  She was also not a candidate for intervention as her findings were not consistent with large vessel occlusive disease. The patient was transferred to [facility] for further evaluation and treatment. An MRI revealed an acute lacunar infarct of the posterior left corona radiata and posterior lentiform, with two small superimposed small acute white matter infarcts in the posterior left MCA territory, including near the left motor strip.  The patient was not on antiplatelet therapy prior to admission.   Date last known well: Date: 11/18/2018 Time last known well: Unable to determine  tPA Given: No - Unable to determine time last known well.  Past Medical History Past Medical History:  Diagnosis Date  . Anemia    h/o fibroids  . Bipolar disorder ([place])   . Depression    bipolar  . Eczema   . Hypothyroidism   . Insomnia   . Osteoarthritis   . Osteoporosis   . Psoriasis     Surgical History Past Surgical History:  Procedure Laterality Date  . ABDOMINAL HYSTERECTOMY  1982  . APPENDECTOMY  2003  . BREAST LUMPECTOMY  1947   left  . CATARACT EXTRACTION  2010  . EYE SURGERY    . HERNIA REPAIR    . KNEE ARTHROSCOPY  09/07/11   right knee  . NASAL SEPTUM SURGERY  1970's  . TONSILLECTOMY  1970's  . TONSILLECTOMY    . TOTAL KNEE ARTHROPLASTY Right 03/04/2015   Procedure: RIGHT TOTAL KNEE ARTHROPLASTY;  Surgeon: Mcarthur Rossetti, MD;  Location: [place];  Service: Orthopedics;  Laterality:  Right;  . VENTRAL HERNIA REPAIR  2005    Family History  Family History  Problem Relation Age of Onset  . Colon cancer Brother   . Lung cancer Father   . Prostate cancer Father   . Arthritis Father   . Cancer Father        lung, prostate  . Heart disease Brother   . Cancer Brother        lung  . Lung cancer Brother   . Aortic aneurysm Brother   . Cancer Other        colon    Social History:   reports that she quit smoking about 33 years ago. She has never used smokeless tobacco. She reports that she does not drink alcohol or use drugs.  Allergies:  Allergies  Allergen Reactions  . Bupropion Hcl Rash    Home Medications:  Medications Prior to Admission  Medication Sig Dispense Refill  . ammonium lactate (LAC-HYDRIN) 12 % cream Apply topically as needed for dry skin. 385 g 0  . ARIPiprazole (ABILIFY) 5 MG tablet Take 1 tablet by mouth daily.    . calcium carbonate (OS-CAL) 600 MG TABS Take 600 mg by mouth 2 (two) times daily with a meal.      . cholecalciferol (VITAMIN D) 1000 UNITS tablet Take 3,000 Units by mouth daily.     . Cyanocobalamin (B-12) 2500 MCG TABS Take 1 tablet by mouth daily.    Marland Kitchen  docusate sodium (COLACE) 100 MG capsule Take 1 capsule (100 mg total) by mouth 2 (two) times daily. (Patient taking differently: Take 100 mg by mouth daily as needed for mild constipation. ) 10 capsule 0  . Glucosamine 500 MG TABS Take 1,000 mg by mouth daily.     . hypromellose (GENTEAL) 0.3 % GEL ophthalmic ointment Place 1 application into both eyes at bedtime.    Marland Kitchen levothyroxine (SYNTHROID, LEVOTHROID) 88 MCG tablet Take 1 tablet (88 mcg total) by mouth daily. 90 tablet 1  . Lutein 40 MG CAPS Take 20 mg by mouth daily.     . Melatonin 5 MG TABS Take 15 mg by mouth at bedtime.    . multivitamin (THERAGRAN) per tablet Take 1 tablet by mouth daily.      Marland Kitchen zolpidem (AMBIEN) 10 MG tablet Take 10 mg by mouth at bedtime.       Hospital Medications .  stroke: mapping our early  stages of recovery book   Does not apply Once  . ARIPiprazole  5 mg Oral Daily  . aspirin  300 mg Rectal Daily   Or  . aspirin  325 mg Oral Daily  . enoxaparin (LOVENOX) injection  40 mg Subcutaneous Q24H  . levothyroxine  88 mcg Oral Daily  . Melatonin  15 mg Oral QHS    ROS: History obtained from patient:  General ROS: negative for - chills, fatigue, fever, night sweats, weight gain or weight loss Psychological ROS: negative for - behavioral disorder, hallucinations, memory difficulties, mood swings or suicidal ideation Ophthalmic ROS: negative for - blurry vision, double vision, eye pain or loss of vision ENT ROS: negative for - epistaxis, nasal discharge, oral lesions, sore throat, tinnitus or vertigo Allergy and Immunology ROS: negative for - hives or itchy/watery eyes Hematological and Lymphatic ROS: negative for - bleeding problems, bruising or swollen lymph nodes Endocrine ROS: negative for - galactorrhea, hair pattern changes, polydipsia/polyuria or temperature intolerance Respiratory ROS: negative for - cough, hemoptysis, shortness of breath or wheezing Cardiovascular ROS: negative for - chest pain, dyspnea on exertion, edema or irregular heartbeat Gastrointestinal ROS: negative for - abdominal pain, diarrhea, hematemesis, nausea/vomiting or stool incontinence Genito-Urinary ROS: negative for - dysuria, hematuria, incontinence or urinary frequency/urgency Musculoskeletal ROS: negative for - joint swelling or muscular weakness Neurological ROS: as noted in HPI Dermatological ROS: negative for rash and skin lesion changes   Physical Examination:  Vitals:   11/19/18 0004 11/19/18 0204 11/19/18 0403 11/19/18 0802  BP: (!) 178/73 (!) 161/80 (!) 155/78 (!) 167/70  Pulse: 73 75 72 79  Resp: 14 13 16 18   Temp: 97.6 F (36.4 C) 98.5 F (36.9 C) 98.7 F (37.1 C) 98.9 F (37.2 C)  TempSrc: Oral Oral Oral Oral  SpO2: 97% 97% 96% 95%   Physical exam: Exam: Gen: NAD,  conversant, well nourised, obese, well groomed                     CV: RRR, no MRG. No Carotid Bruits. No peripheral edema, warm, nontender Eyes: Conjunctivae clear without exudates or hemorrhage  Neuro: Detailed Neurologic Exam  Speech:    Speech is normal; fluent and spontaneous with normal comprehension. Slight dysarthria likely due to being adentulous.  Cognition:    The patient is oriented to person, month and age. Follows commands.  recent and remote memory appear intact;    language without aphasia,  normal attention, concentration. Cranial Nerves:    The pupils are equal, round,  and reactive to light.  Attempted funduscopic exam could not visualize due to small pupils. Visual fields are full to finger confrontation. Extraocular movements are intact. Trigeminal sensation is intact and the muscles of mastication are normal.  Right nasolabial flattening. The palate elevates in the midline. Hearing intact to voice. Voice is normal. Shoulder shrug is normal. The tongue has normal motion without fasciculations.   Coordination:    No dysmetria noted  Gait:    Patient cannot stand, turn or transfer independently, attempted  Motor Observation:    No involuntary movements noted, patient is frail-appearing with generalized atrophy throughout Tone:    Normal muscle tone.    Posture:    Posture is normal sitting in chair    Strength:    Patient has generalized weakness with right arm and right leg hemiparesis.  Can lift right arm and right leg antigravity with drift.     Sensation: intact to LT, she denies any decreased sensation on the arms or legs.  No extinction or inattention.     Reflex Exam:  DTR's:    Deep tendon reflexes in the upper and lower extremities are symmetrical bilaterally.   Toes:    The toes are equivocal bilaterally.   Clonus:    Clonus is absent.  LABORATORY STUDIES:  Basic Metabolic Panel: Recent Labs  Lab 11/18/18 1416  NA 143  K 3.5  CL 109  CO2  25  GLUCOSE 99  BUN 16  CREATININE 0.90  CALCIUM 9.2    Liver Function Tests: Recent Labs  Lab 11/18/18 1416  AST 26  ALT 20  ALKPHOS 25*  BILITOT 0.7  PROT 7.7  ALBUMIN 4.4   No results for input(s): LIPASE, AMYLASE in the last 168 hours. No results for input(s): AMMONIA in the last 168 hours.  CBC: Recent Labs  Lab 11/18/18 1416  WBC 9.4  NEUTROABS 6.7  HGB 14.4  HCT 43.9  MCV 101.2*  PLT 254    Cardiac Enzymes: No results for input(s): CKTOTAL, CKMB, CKMBINDEX, TROPONINI in the last 168 hours.  BNP: Invalid input(s): POCBNP  CBG: Recent Labs  Lab 11/18/18 1454  GLUCAP 91    Microbiology:   Coagulation Studies: Recent Labs    11/18/18 1416  LABPROT 12.1  INR 0.90    Urinalysis: No results for input(s): COLORURINE, LABSPEC, PHURINE, GLUCOSEU, HGBUR, BILIRUBINUR, KETONESUR, PROTEINUR, UROBILINOGEN, NITRITE, LEUKOCYTESUR in the last 168 hours.  Invalid input(s): APPERANCEUR  Lipid Panel:     Component Value Date/Time   CHOL 191 11/19/2018 0535   TRIG 57 11/19/2018 0535   TRIG 80 09/06/2006 1101   HDL 54 11/19/2018 0535   CHOLHDL 3.5 11/19/2018 0535   VLDL 11 11/19/2018 0535   LDLCALC 126 (H) 11/19/2018 0535    HgbA1C:  Lab Results  Component Value Date   HGBA1C 5.2 11/19/2018    Urine Drug Screen:  No results found for: LABOPIA, COCAINSCRNUR, LABBENZ, AMPHETMU, THCU, LABBARB   Alcohol Level:  No results for input(s): ETH in the last 168 hours.  Miscellaneous labs:  Echo:  1. The left ventricle has normal systolic function of 90-24%. The cavity size is normal. There is no left ventricular wall thickness. Echo evidence of impaired relaxation diastolic filling patterns. Normal left ventricular filling pressures.  2. Normal left atrial size.  3. Normal right atrial size.  4. The aortic valve is tricuspid. There is mild thickening and sclerosis without any evidence of stenosis of the aortic valve.  5. No atrial level  shunt detected  by color flow Doppler.   IMAGING:  Dg Chest 2 View 11/18/2018 IMPRESSION:  No acute cardiopulmonary disease.    Ct Head Wo Contrast 11/18/2018 IMPRESSION:  1. Small linear high LEFT frontal white matter hypodensity extending to the cortex-question infarct of uncertain chronicity. No hemorrhage.  2. Chronic small-vessel white matter ischemic changes.    Mr Jodene Nam Head Wo Contrast 11/18/2018 IMPRESSION:  1. Acute lacunar infarct of the posterior Left corona radiata and posterior lentiform, with two small superimposed small acute white matter infarcts in the posterior Left MCA territory, including near the left motor strip. No associated hemorrhage or mass effect.  2. Intracranial MRA is negative for large vessel occlusion but positive for moderate stenoses of the both the distal Left A2 and Left anterior M2 branches.  3. Underlying moderate chronic small vessel disease.   PRELIM Carotid US: Summary: Right Carotid: The extracranial vessels were near-normal with only minimal wall thickening or plaque.Left Carotid: The extracranial vessels were near-normal with only minimal wall thickening or plaque.Vertebrals:  Bilateral vertebral arteries demonstrate antegrade flow.Subclavians: Normal flow hemodynamics were seen in bilateral subclavian arteries.   Assessment: 85 y.o. female with a history of osteoporosis, hypothyroidism, anemia, and bipolar disorder who presented with right sided weakness and was found to have an acute lacunar infarct of the posterior left corona radiata and posterior lentiform, with two small superimposed small acute white matter infarcts in the posterior left MCA territory.   - Multiple infarcts in various distributions are consistent with an embolic source etiology unknown.  -  Await carotid dopplers.  - Consider TEE / loop. Monitor on telemetry in the interim.   Stroke Risk Factors - advanced age   Plan:  HgbA1c, fasting lipid panel - completed  MRI, MRA  of the  brain without contrast - completed  PT consult, OT consult, Speech consult - ordered  Echocardiogram - No thrombus, emboli or pfo appreciated.   Carotid dopplers - Prelim "near normal"  Prophylactic therapy - Antiplatelet medication - Now on ASA 325 mg daily, not on antiplatelet prior per patient  Allow for permissive hypertension for the first 24-48h - only treat PRN if SBP >220 mmHg. Blood pressures can be gradually normalized to SBP<140 upon discharge  Statin Therapy - LDL goal < 70 - LDL 126 - recommend Lipitor 40 to 80 mg daily  Risk factor modification  Telemetry monitoring  Frequent neuro checks  Fall Precautions  DVT prophelaxis - on Lovenox  NPO until swallowing evaluation has been passed -> cleared for diet    This is a very frail looking 85 year old female here with embolic strokes of unknown etiology.  Overall she is quite weak but with prominent right arm and right leg hemiparesis, antigravity with drift and right NL flattening.  Her son is at bedside and discussed strokes and work-up.  Pending carotid Dopplers and other testing.  Will need TEE and loop.  Was not on any anti-platelet at home.  Personally examined patient and images, and have participated in and made any corrections needed to history, physical, neuro exam,assessment and plan as stated above.  I have personally obtained the history, evaluated lab date, reviewed imaging studies and agree with radiology interpretations.    Sarina Ill, MD Stroke Neurology

## 2018-11-19 NOTE — Evaluation (Signed)
Physical Therapy Evaluation Patient Details Name: [name] [name] MRN: [id number] DOB: Feb 21, 1933 Today's Date: 11/19/2018   History of Present Illness  Patient is a 85 y/o female who presents with right sided weakness. NIH 3. Brain MRI- acute infarct posterior left corona radiata and posterior lentiform with two small superimposed small acute white matter infarcts in the posterior Left MCA territory. PMH includes depression, bipolar disorder.   Clinical Impression  Patient presents with right hemiparesis, incoordination, ataxic gait, impaired balance and impaired mobility s/p above. Tolerated bed mobility and transfers with Min-Mod A for balance/safety. Tolerated gait training with Mod A to weight shift, advance RLE and to prevent right knee from buckling. Pt independent PTA, lives alone and does water aerobics 3 days/week. Son is very supportive and lives close by. Education re: neuroplasticity, stroke symptoms, disposition etc. Would benefit from CIR to maximize independence and mobility prior to return home.  I have discussed the patient's current level of function related to stroke with the patient and her son.  They acknowledge understanding of this and do not feel the patient would be able to have their care needs met at home.  They are interested in post-acute rehab in an inpatient setting.      Follow Up Recommendations CIR;Supervision for mobility/OOB    Equipment Recommendations  None recommended by PT    Recommendations for Other Services       Precautions / Restrictions Precautions Precautions: Fall Restrictions Weight Bearing Restrictions: No      Mobility  Bed Mobility Overal bed mobility: Needs Assistance Bed Mobility: Supine to Sit     Supine to sit: HOB elevated;Min assist     General bed mobility comments: Increase time and assist scooting scooting right hip to EOB. Not using RUE for transfer.  Transfers Overall transfer level: Needs assistance Equipment  used: None Transfers: Sit to/from Stand Sit to Stand: Min assist         General transfer comment: ASsist to power to standing with cues for right foot placement and to push through RUE, cues for upright posture. Stood from EOB x1, SPT bed to chair with MOd A and right knee buckling; stood from chair x2.   Ambulation/Gait Ambulation/Gait assistance: Mod assist Gait Distance (Feet): 12 Feet(x2 bouts) Assistive device: (rail in hallway) Gait Pattern/deviations: Step-to pattern;Step-through pattern;Ataxic;Narrow base of support;Decreased weight shift to right Gait velocity: decreased Gait velocity interpretation: <1.8 ft/sec, indicate of risk for recurrent falls General Gait Details: Slow, unsteady gait with difficulty progressing RLE, assist to weight shift and for trunk control. Right knee instability and buckling x3. 1 seated rest break.  Stairs            Wheelchair Mobility    Modified Rankin (Stroke Patients Only) Modified Rankin (Stroke Patients Only) Pre-Morbid Rankin Score: Slight disability Modified Rankin: Moderately severe disability     Balance Overall balance assessment: Needs assistance Sitting-balance support: Feet supported;No upper extremity supported Sitting balance-Leahy Scale: Fair     Standing balance support: During functional activity Standing balance-Leahy Scale: Poor Standing balance comment: Requires external support for standing balance.                              Pertinent Vitals/Pain Pain Assessment: No/denies pain    Home Living Family/patient expects to be discharged to:: Private residence Living Arrangements: Alone Available Help at Discharge: Family;Available PRN/intermittently Type of Home: House Home Access: Level entry     Home Layout:  One level Home Equipment: Trimble - 2 wheels;Shower seat;Bedside commode      Prior Function Level of Independence: Needs assistance   Gait / Transfers Assistance Needed: Walks  independently.  ADL's / Homemaking Assistance Needed: Does own ADLs. Son helps with grocery shopping/drives to appts.  Comments: Does water aerobics 3 times/week.      Hand Dominance   Dominant Hand: Right    Extremity/Trunk Assessment   Upper Extremity Assessment Upper Extremity Assessment: Defer to OT evaluation;RUE deficits/detail RUE Deficits / Details: Grip weak compared to LUE; impaired finger to nose testing, dysdiadochokinesia. RUE Sensation: WNL RUE Coordination: decreased fine motor;decreased gross motor    Lower Extremity Assessment Lower Extremity Assessment: RLE deficits/detail RLE Deficits / Details: Grossly ~2/5 ankle DF/PF, 3/5 knee extension/flexion, 2/5 hip flexion. Difficulty with rapid alternating movements, heel to shin RLE Sensation: WNL RLE Coordination: decreased fine motor;decreased gross motor       Communication   Communication: No difficulties  Cognition Arousal/Alertness: Awake/alert Behavior During Therapy: WFL for tasks assessed/performed Overall Cognitive Status: History of cognitive impairments - at baseline                                 General Comments: Some memory deficits per son.      General Comments General comments (skin integrity, edema, etc.): Son present during session.    Exercises     Assessment/Plan    PT Assessment Patient needs continued PT services  PT Problem List Decreased strength;Decreased mobility;Decreased balance;Decreased range of motion;Decreased coordination       PT Treatment Interventions Functional mobility training;Balance training;Patient/family education;Gait training;Therapeutic activities;Therapeutic exercise;Neuromuscular re-education;DME instruction    PT Goals (Current goals can be found in the Care Plan section)  Acute Rehab PT Goals Patient Stated Goal: to get back home and be independent PT Goal Formulation: With patient/family Time For Goal Achievement:  12/03/18 Potential to Achieve Goals: Good    Frequency Min 4X/week   Barriers to discharge Decreased caregiver support lives alone    Co-evaluation               AM-PAC PT "6 Clicks" Mobility  Outcome Measure Help needed turning from your back to your side while in a flat bed without using bedrails?: A Little Help needed moving from lying on your back to sitting on the side of a flat bed without using bedrails?: A Little Help needed moving to and from a bed to a chair (including a wheelchair)?: A Lot Help needed standing up from a chair using your arms (e.g., wheelchair or bedside chair)?: A Little Help needed to walk in hospital room?: A Lot Help needed climbing 3-5 steps with a railing? : Total 6 Click Score: 14    End of Session Equipment Utilized During Treatment: Gait belt Activity Tolerance: Patient tolerated treatment well Patient left: in chair;with call bell/phone within reach;with family/visitor present Nurse Communication: Mobility status;Other (comment)(transfer technique) PT Visit Diagnosis: Hemiplegia and hemiparesis;Difficulty in walking, not elsewhere classified (R26.2);Muscle weakness (generalized) (M62.81);Ataxic gait (R26.0) Hemiplegia - Right/Left: Right Hemiplegia - dominant/non-dominant: Dominant Hemiplegia - caused by: Cerebral infarction    Time: 0910-0956 PT Time Calculation (min) (ACUTE ONLY): 46 min   Charges:   PT Evaluation $PT Eval Moderate Complexity: 1 Mod PT Treatments $Gait Training: 8-22 mins $Therapeutic Activity: 8-22 mins        Wray Kearns, PT, DPT Acute Rehabilitation Services Pager [phone] Office [phone]  Marguarite Arbour A Barnes Florek 11/19/2018, 10:13 AM

## 2018-11-20 ENCOUNTER — Inpatient Hospital Stay (HOSPITAL_COMMUNITY)
Admission: RE | Admit: 2018-11-20 | Discharge: 2018-12-06 | DRG: 057 | Disposition: A | Discharge status: Skilled Nursing Facility | Payer: Medicare Other | Source: Intra-hospital | Attending: Physical Medicine & Rehabilitation | Admitting: Physical Medicine & Rehabilitation

## 2018-11-20 ENCOUNTER — Inpatient Hospital Stay (HOSPITAL_COMMUNITY): Payer: Medicare Other

## 2018-11-20 ENCOUNTER — Encounter (HOSPITAL_COMMUNITY)
Admission: EM | Disposition: A | Discharge status: Rehabilitation Facility | Payer: Self-pay | Source: Home / Self Care | Attending: Family Medicine

## 2018-11-20 ENCOUNTER — Encounter (HOSPITAL_COMMUNITY): Payer: Self-pay

## 2018-11-20 DIAGNOSIS — R609 Edema, unspecified: Secondary | ICD-10-CM | POA: Diagnosis not present

## 2018-11-20 DIAGNOSIS — Z79890 Hormone replacement therapy (postmenopausal): Secondary | ICD-10-CM | POA: Diagnosis not present

## 2018-11-20 DIAGNOSIS — Z9181 History of falling: Secondary | ICD-10-CM | POA: Diagnosis not present

## 2018-11-20 DIAGNOSIS — R0989 Other specified symptoms and signs involving the circulatory and respiratory systems: Secondary | ICD-10-CM | POA: Diagnosis not present

## 2018-11-20 DIAGNOSIS — I169 Hypertensive crisis, unspecified: Secondary | ICD-10-CM | POA: Diagnosis not present

## 2018-11-20 DIAGNOSIS — M1990 Unspecified osteoarthritis, unspecified site: Secondary | ICD-10-CM | POA: Diagnosis present

## 2018-11-20 DIAGNOSIS — Z8261 Family history of arthritis: Secondary | ICD-10-CM

## 2018-11-20 DIAGNOSIS — Z8249 Family history of ischemic heart disease and other diseases of the circulatory system: Secondary | ICD-10-CM

## 2018-11-20 DIAGNOSIS — E876 Hypokalemia: Secondary | ICD-10-CM | POA: Diagnosis not present

## 2018-11-20 DIAGNOSIS — R2689 Other abnormalities of gait and mobility: Secondary | ICD-10-CM | POA: Diagnosis not present

## 2018-11-20 DIAGNOSIS — R278 Other lack of coordination: Secondary | ICD-10-CM | POA: Diagnosis not present

## 2018-11-20 DIAGNOSIS — Z96651 Presence of right artificial knee joint: Secondary | ICD-10-CM | POA: Diagnosis present

## 2018-11-20 DIAGNOSIS — Z87891 Personal history of nicotine dependence: Secondary | ICD-10-CM

## 2018-11-20 DIAGNOSIS — M810 Age-related osteoporosis without current pathological fracture: Secondary | ICD-10-CM | POA: Diagnosis present

## 2018-11-20 DIAGNOSIS — I69351 Hemiplegia and hemiparesis following cerebral infarction affecting right dominant side: Principal | ICD-10-CM

## 2018-11-20 DIAGNOSIS — F319 Bipolar disorder, unspecified: Secondary | ICD-10-CM | POA: Diagnosis present

## 2018-11-20 DIAGNOSIS — Z800 Family history of malignant neoplasm of digestive organs: Secondary | ICD-10-CM | POA: Diagnosis not present

## 2018-11-20 DIAGNOSIS — R41841 Cognitive communication deficit: Secondary | ICD-10-CM | POA: Diagnosis not present

## 2018-11-20 DIAGNOSIS — G8191 Hemiplegia, unspecified affecting right dominant side: Secondary | ICD-10-CM | POA: Diagnosis not present

## 2018-11-20 DIAGNOSIS — I340 Nonrheumatic mitral (valve) insufficiency: Secondary | ICD-10-CM

## 2018-11-20 DIAGNOSIS — Z8042 Family history of malignant neoplasm of prostate: Secondary | ICD-10-CM

## 2018-11-20 DIAGNOSIS — L409 Psoriasis, unspecified: Secondary | ICD-10-CM | POA: Diagnosis present

## 2018-11-20 DIAGNOSIS — E440 Moderate protein-calorie malnutrition: Secondary | ICD-10-CM | POA: Diagnosis not present

## 2018-11-20 DIAGNOSIS — E039 Hypothyroidism, unspecified: Secondary | ICD-10-CM | POA: Diagnosis present

## 2018-11-20 DIAGNOSIS — I10 Essential (primary) hypertension: Secondary | ICD-10-CM

## 2018-11-20 DIAGNOSIS — Z888 Allergy status to other drugs, medicaments and biological substances status: Secondary | ICD-10-CM | POA: Diagnosis not present

## 2018-11-20 DIAGNOSIS — I63512 Cerebral infarction due to unspecified occlusion or stenosis of left middle cerebral artery: Secondary | ICD-10-CM | POA: Diagnosis not present

## 2018-11-20 DIAGNOSIS — E785 Hyperlipidemia, unspecified: Secondary | ICD-10-CM | POA: Diagnosis present

## 2018-11-20 DIAGNOSIS — R2681 Unsteadiness on feet: Secondary | ICD-10-CM | POA: Diagnosis not present

## 2018-11-20 DIAGNOSIS — Z801 Family history of malignant neoplasm of trachea, bronchus and lung: Secondary | ICD-10-CM | POA: Diagnosis not present

## 2018-11-20 DIAGNOSIS — F5104 Psychophysiologic insomnia: Secondary | ICD-10-CM | POA: Diagnosis present

## 2018-11-20 DIAGNOSIS — I6300 Cerebral infarction due to thrombosis of unspecified precerebral artery: Secondary | ICD-10-CM

## 2018-11-20 DIAGNOSIS — M6281 Muscle weakness (generalized): Secondary | ICD-10-CM | POA: Diagnosis not present

## 2018-11-20 DIAGNOSIS — F5101 Primary insomnia: Secondary | ICD-10-CM | POA: Diagnosis not present

## 2018-11-20 DIAGNOSIS — I6389 Other cerebral infarction: Secondary | ICD-10-CM

## 2018-11-20 HISTORY — PX: LOOP RECORDER INSERTION: EP1214

## 2018-11-20 HISTORY — PX: LOOP RECORDER INSERTION: SHX6722

## 2018-11-20 HISTORY — PX: TEE WITHOUT CARDIOVERSION: SHX5443

## 2018-11-20 SURGERY — LOOP RECORDER INSERTION

## 2018-11-20 SURGERY — ECHOCARDIOGRAM, TRANSESOPHAGEAL
Anesthesia: Moderate Sedation

## 2018-11-20 MED ORDER — ASPIRIN 81 MG PO TBEC: 81.0000 mg | DELAYED_RELEASE_TABLET | Freq: Every day | ORAL | 0 refills | Status: DC

## 2018-11-20 MED ORDER — LABETALOL HCL 5 MG/ML IV SOLN: 10.0000 mg | INTRAVENOUS | Status: DC | PRN

## 2018-11-20 MED ORDER — ENOXAPARIN SODIUM 40 MG/0.4ML ~~LOC~~ SOLN
40.0000 mg | SUBCUTANEOUS | Status: DC
  Administered 2018-11-20 – 2018-12-05 (×16): 40 mg via SUBCUTANEOUS
  Filled 2018-11-20 (×17): qty 0.4

## 2018-11-20 MED ORDER — CLONIDINE HCL 0.1 MG PO TABS
0.1000 mg | ORAL_TABLET | Freq: Every day | ORAL | Status: DC | PRN
  Administered 2018-11-24: 0.1 mg via ORAL
  Filled 2018-11-20: qty 1

## 2018-11-20 MED ORDER — FENTANYL CITRATE (PF) 100 MCG/2ML IJ SOLN
INTRAMUSCULAR | Status: AC
  Filled 2018-11-20: qty 2

## 2018-11-20 MED ORDER — LACTATED RINGERS IV SOLN
INTRAVENOUS | Status: DC
  Administered 2018-11-20: 15:00:00 via INTRAVENOUS

## 2018-11-20 MED ORDER — ACETAMINOPHEN 325 MG PO TABS
650.0000 mg | ORAL_TABLET | ORAL | Status: DC | PRN
  Filled 2018-11-20: qty 2

## 2018-11-20 MED ORDER — SODIUM CHLORIDE 0.9 % IV SOLN: INTRAVENOUS | Status: DC

## 2018-11-20 MED ORDER — LEVOTHYROXINE SODIUM 88 MCG PO TABS
88.0000 ug | ORAL_TABLET | Freq: Every day | ORAL | Status: DC
  Administered 2018-11-21 – 2018-12-06 (×16): 88 ug via ORAL
  Filled 2018-11-20 (×17): qty 1

## 2018-11-20 MED ORDER — FENTANYL CITRATE (PF) 100 MCG/2ML IJ SOLN
INTRAMUSCULAR | Status: DC | PRN
  Administered 2018-11-20: 12.5 ug via INTRAVENOUS

## 2018-11-20 MED ORDER — ENOXAPARIN SODIUM 40 MG/0.4ML ~~LOC~~ SOLN: 40.0000 mg | SUBCUTANEOUS | Status: DC

## 2018-11-20 MED ORDER — HYDRALAZINE HCL 20 MG/ML IJ SOLN: 5.0000 mg | Freq: Four times a day (QID) | INTRAMUSCULAR | Status: DC | PRN

## 2018-11-20 MED ORDER — BUTAMBEN-TETRACAINE-BENZOCAINE 2-2-14 % EX AERO
INHALATION_SPRAY | CUTANEOUS | Status: DC | PRN
  Administered 2018-11-20: 1 via TOPICAL

## 2018-11-20 MED ORDER — ATORVASTATIN CALCIUM 40 MG PO TABS: 40.0000 mg | ORAL_TABLET | Freq: Every day | ORAL | 0 refills | Status: AC

## 2018-11-20 MED ORDER — CLOPIDOGREL BISULFATE 75 MG PO TABS: 75.0000 mg | ORAL_TABLET | Freq: Every day | ORAL | Status: DC

## 2018-11-20 MED ORDER — MELATONIN 3 MG PO TABS
15.0000 mg | ORAL_TABLET | Freq: Every day | ORAL | Status: DC
  Administered 2018-11-20 – 2018-12-05 (×16): 15 mg via ORAL
  Filled 2018-11-20 (×16): qty 5

## 2018-11-20 MED ORDER — HYDRALAZINE HCL 20 MG/ML IJ SOLN
10.0000 mg | Freq: Once | INTRAMUSCULAR | Status: AC
  Administered 2018-11-20: 10 mg via INTRAVENOUS

## 2018-11-20 MED ORDER — LIDOCAINE-EPINEPHRINE 1 %-1:100000 IJ SOLN
INTRAMUSCULAR | Status: DC | PRN
  Administered 2018-11-20: 30 mL

## 2018-11-20 MED ORDER — SORBITOL 70 % SOLN
30.0000 mL | Freq: Every day | Status: DC | PRN
  Administered 2018-11-21: 30 mL via ORAL
  Filled 2018-11-20: qty 30

## 2018-11-20 MED ORDER — LIDOCAINE-EPINEPHRINE 1 %-1:100000 IJ SOLN
INTRAMUSCULAR | Status: AC
  Filled 2018-11-20: qty 1

## 2018-11-20 MED ORDER — ARIPIPRAZOLE 5 MG PO TABS
5.0000 mg | ORAL_TABLET | Freq: Every day | ORAL | Status: DC
  Administered 2018-11-21 – 2018-12-06 (×16): 5 mg via ORAL
  Filled 2018-11-20 (×16): qty 1

## 2018-11-20 MED ORDER — MIDAZOLAM HCL (PF) 5 MG/ML IJ SOLN
INTRAMUSCULAR | Status: AC
  Filled 2018-11-20: qty 1

## 2018-11-20 MED ORDER — ASPIRIN EC 81 MG PO TBEC: 81.0000 mg | DELAYED_RELEASE_TABLET | Freq: Every day | ORAL | Status: DC

## 2018-11-20 MED ORDER — ATORVASTATIN CALCIUM 40 MG PO TABS
40.0000 mg | ORAL_TABLET | Freq: Every day | ORAL | Status: DC
  Administered 2018-11-21 – 2018-12-05 (×15): 40 mg via ORAL
  Filled 2018-11-20 (×15): qty 1

## 2018-11-20 MED ORDER — AMLODIPINE BESYLATE 5 MG PO TABS: 5.0000 mg | ORAL_TABLET | Freq: Every day | ORAL | 11 refills | Status: DC

## 2018-11-20 MED ORDER — MIDAZOLAM HCL (PF) 10 MG/2ML IJ SOLN
INTRAMUSCULAR | Status: DC | PRN
  Administered 2018-11-20: 1 mg via INTRAVENOUS

## 2018-11-20 MED ORDER — ACETAMINOPHEN 650 MG RE SUPP: 650.0000 mg | RECTAL | Status: DC | PRN

## 2018-11-20 MED ORDER — ACETAMINOPHEN 160 MG/5ML PO SOLN
650.0000 mg | ORAL | Status: DC | PRN
  Filled 2018-11-20: qty 20.3

## 2018-11-20 MED ORDER — HYDRALAZINE HCL 20 MG/ML IJ SOLN
INTRAMUSCULAR | Status: AC
  Filled 2018-11-20: qty 1

## 2018-11-20 MED ORDER — CLOPIDOGREL BISULFATE 75 MG PO TABS: 75.0000 mg | ORAL_TABLET | Freq: Every day | ORAL | 0 refills | Status: AC

## 2018-11-20 SURGICAL SUPPLY — 2 items
LOOP REVEAL LINQSYS (Prosthesis & Implant Heart) ×1 IMPLANT
PACK LOOP INSERTION (CUSTOM PROCEDURE TRAY) ×2 IMPLANT

## 2018-11-20 NOTE — Consult Note (Addendum)
    [facility] has been requested to perform a transesophageal echocardiogram on [name] [name] for cryptogenic stroke.  After careful review of history and examination, the risks and benefits of transesophageal echocardiogram have been explained including risks of esophageal damage, perforation (1:10,000 risk), bleeding, pharyngeal hematoma as well as other potential complications associated with conscious sedation including aspiration, arrhythmia, respiratory failure and death. Alternatives to treatment were discussed, questions were answered. Patient is willing to proceed.   Renee Dyane Dustman, PA-C  11/20/2018 10:13 AM

## 2018-11-20 NOTE — Discharge Summary (Signed)
Physician Discharge Summary  [name] [name] QQI:[id number] DOB: 1933/05/07 DOA: 11/18/2018  PCP: Ann Held, DO  Admit date: 11/18/2018 Discharge date: 11/20/2018  Admitted From: Home  Disposition:  [facility]   Recommendations for Outpatient Follow-up:  1. Follow up with PCP in 1-2 weeks after discharge from rehab 2. Please obtain BMP/CBC in one week while in rehab 3. Please obtain lipids in 3 months on new atorvastatin 4. Titrate BP to achieve goal <140/90 over the next 4-5 days     Home Health: N/A  Equipment/Devices: TBD at CIR  Discharge Condition: Fair  CODE STATUS: DO NOT RESUSCITATE Diet recommendation: Cardiac  Brief/Interim Summary: [name] [name] is a 85 y.o. F with hx HTN untreated, hypothyroidism and Bipolar disorder who presents with right sided weakness.  Patient was in [place] until day of admission, woke and collapsed because her right leg gave out, soon became obvious she had right sided hemiplegia.  In ER, CT head showed scattered infarct.  Neurology evaluated, not candidate for tPA given deficits not suggesting LVO.         PRINCIPAL HOSPITAL DIAGNOSIS:  Acute suspected embolic stroke    Discharge Diagnoses:   Acute embolic stroke Admitted with right-sided hemiparesis showed acute lacunar infarct posterior left corona radiata as well as posterior lentiform, and 2 small superimposed small acute white matter infarcts in the posterior left MCA territory.  Suspected scattered emboli.  Non-invasive angiography showed moderate intracranial atherosclerosis.  Echocardiogram showed no intracardiac source.  MRA of the neck showed no significant carotid stenosis.  LDL greater than 100, started on atorvastatin 40 mg.  Aspirin ordered at admission, discharged on aspirin and Plavix for 3 weeks, followed by aspirin 81 mg alone indefinitely.  Atrial fibrillation not present on admission, nor noted on telemetry during hospitalization.  A TEE was negative  for left atrial clot, and a loop recorder was placed.  tPA not given because  window for TPA.  Dysphagia screen ordered in the ER.  PT evaluation ordered at admission.  She is a non-smoker.       Hypertension Patient claims no history of hypertension.  However chart review shows that she has had blood pressure in the 140s on several occasions in the past.  At admission her blood pressure was greater than 210/110. -Amlodipine started at discharge -Gradually normalize BP over next 4-5 days  Hypothyroidism TSH normal  Bipolar disorder              Discharge Instructions  Discharge Instructions    Diet - low sodium heart healthy   Complete by:  As directed    Discharge instructions   Complete by:  As directed    From Dr. Loleta Books: You were admitted with right sided weakness, which appeared to be from a stroke.  To reduce your risk of another stroke, we recommend you take the following medicines: Aspirin and Plavix for preventing small clots, atorvastatin for cholesterol, amlodipine for blood pressure  Take aspirin 81 mg and Plavix 75 mg for three weeks After three weeks, stop Plavix and take aspirin alone indefinitely  Take atorvastatin 40 mg nightly Have your primary care doctor check your cholesterol in 3 months  Take amlodipine 5 mg daily The doctors in rehab will add additional blood pressure medicines as needed to gradually reduce your blood pressure over the next 4-5 days   Increase activity slowly   Complete by:  As directed      Allergies as of 11/20/2018  Reactions   Bupropion Hcl Rash      Medication List    TAKE these medications   AMBIEN 10 MG tablet Generic drug:  zolpidem Take 10 mg by mouth at bedtime.   amLODipine 5 MG tablet Commonly known as:  NORVASC Take 1 tablet (5 mg total) by mouth daily.   ammonium lactate 12 % cream Commonly known as:  LAC-HYDRIN Apply topically as needed for dry skin.   ARIPiprazole 5 MG  tablet Commonly known as:  ABILIFY Take 1 tablet by mouth daily.   aspirin 81 MG EC tablet Take 1 tablet (81 mg total) by mouth daily.   atorvastatin 40 MG tablet Commonly known as:  LIPITOR Take 1 tablet (40 mg total) by mouth daily at 6 PM.   B-12 2500 MCG Tabs Take 1 tablet by mouth daily.   calcium carbonate 600 MG Tabs tablet Commonly known as:  OS-CAL Take 600 mg by mouth 2 (two) times daily with a meal.   cholecalciferol 1000 units tablet Commonly known as:  VITAMIN D Take 3,000 Units by mouth daily.   clopidogrel 75 MG tablet Commonly known as:  PLAVIX Take 1 tablet (75 mg total) by mouth daily.   docusate sodium 100 MG capsule Commonly known as:  COLACE Take 1 capsule (100 mg total) by mouth 2 (two) times daily. What changed:    when to take this  reasons to take this   Glucosamine 500 MG Tabs Take 1,000 mg by mouth daily.   hypromellose 0.3 % Gel ophthalmic ointment Commonly known as:  GENTEAL Place 1 application into both eyes at bedtime.   levothyroxine 88 MCG tablet Commonly known as:  SYNTHROID, LEVOTHROID Take 1 tablet (88 mcg total) by mouth daily.   Lutein 40 MG Caps Take 20 mg by mouth daily.   Melatonin 5 MG Tabs Take 15 mg by mouth at bedtime.   multivitamin per tablet Take 1 tablet by mouth daily.   valACYclovir 500 MG tablet Commonly known as:  VALTREX Take 500 mg by mouth daily.      Follow-up Information    [place].   Specialty:  Cardiology Contact information: [address] [place] [address] [phone]         Allergies  Allergen Reactions  . Bupropion Hcl Rash    Consultations:  Neurology  Cardiology  Electrophysiology   Procedures/Studies: Dg Chest 2 View  Result Date: 11/18/2018 CLINICAL DATA:  Pt had a stroke last night and awoke this AM with right side deficits including numbness in the right upper extremity and weakness in the  right lower extremity. Pt denies chest pain, SOB, and headache. EXAM: CHEST - 2 VIEW COMPARISON:  12/09/2017 FINDINGS: Cardiac silhouette is normal in size. No mediastinal or hilar masses. No evidence of adenopathy stable aortic atherosclerosis. Lungs are hyperexpanded but clear. No pleural effusion or pneumothorax. Skeletal structures are demineralized but intact. IMPRESSION: No acute cardiopulmonary disease. Electronically Signed   By: Lajean Manes M.D.   On: 11/18/2018 19:46   Ct Head Wo Contrast  Result Date: 11/18/2018 CLINICAL DATA:  85 year old female with acute RIGHT arm and leg weakness. EXAM: CT HEAD WITHOUT CONTRAST TECHNIQUE: Contiguous axial images were obtained from the base of the skull through the vertex without intravenous contrast. COMPARISON:  10/15/2014 CT FINDINGS: Brain: A small linear high LEFT frontal white matter hypodensity extends to the cortex and may represent an infarct of uncertain chronicity. No evidence  of hemorrhage, hydrocephalus, extra-axial collection or mass lesion/mass effect. Chronic small-vessel white matter ischemic changes are noted. Vascular: Carotid atherosclerotic calcifications identified. Skull: No acute or suspicious bony abnormalities. Sinuses/Orbits: No acute finding. Other: None IMPRESSION: 1. Small linear high LEFT frontal white matter hypodensity extending to the cortex-question infarct of uncertain chronicity. No hemorrhage. 2. Chronic small-vessel white matter ischemic changes. Electronically Signed   By: Margarette Canada M.D.   On: 11/18/2018 15:02   Mr Brain Wo Contrast  Result Date: 11/18/2018 CLINICAL DATA:  85 year old female with right extremity weakness on waking today. EXAM: MRI HEAD WITHOUT CONTRAST MRA HEAD WITHOUT CONTRAST TECHNIQUE: Multiplanar, multiecho pulse sequences of the brain and surrounding structures were obtained without intravenous contrast. Angiographic images of the head were obtained using MRA technique without contrast. COMPARISON:   Head CT without contrast earlier today. FINDINGS: MRI HEAD FINDINGS Brain: Curvilinear restricted diffusion in the posterior left corona radiata tracking to the posterior lentiform. There also is 2 more subtle areas of subcortical white matter restricted diffusion in the posterosuperior left frontal lobe (near the motor strip) and in the right parietal lobe (series 5, image 77). Mild associated T2 and FLAIR hyperintensity with no associated hemorrhage or mass effect. No contralateral right hemisphere or posterior fossa restricted diffusion. Patchy bilateral cerebral white matter T2 and FLAIR hyperintensity, which in some areas outside of the above findings also resembles (chronic) small vessel infarcts. Occasional chronic micro hemorrhages. Mild to moderate for age T2 heterogeneity in the deep gray matter nuclei. The brainstem and cerebellum are relatively spared. No midline shift, mass effect, evidence of mass lesion, ventriculomegaly, extra-axial collection or acute intracranial hemorrhage. Cervicomedullary junction and pituitary are within normal limits. Vascular: Major intracranial vascular flow voids are preserved. Mild generalized intracranial artery tortuosity. The distal left vertebral artery appears dominant. Skull and upper cervical spine: Visualized bone marrow signal is within normal limits. Negative for age visible cervical spine. Sinuses/Orbits: Postoperative changes to both globes, otherwise negative. Other: Trace right mastoid fluid. Negative scalp and face soft tissues. MRA HEAD FINDINGS Antegrade flow in the posterior circulation with dominant distal left vertebral artery. No distal vertebral stenosis. The left PICA origin is patent. Patent basilar artery without stenosis. Normal SCA and PCA origins. Posterior communicating arteries are diminutive or absent. Bilateral PCA branches are mildly irregular. Antegrade flow in both ICA siphons. Mild siphon irregularity. No siphon stenosis. Patent carotid  termini, MCA and ACA origins. Anterior communicating artery is diminutive or absent. There is a moderate to severe stenosis of the left ACA distal A2 segment (series 1030 image 6) but preserved bilateral distal ACA flow signal. The left MCA M1 segment bifurcates early. There is mild to moderate irregularity and stenosis in the proximal anterior M2 branch. Other visible left MCA branches are within normal limits. Right MCA M1, bifurcation, and visible right MCA branches are within normal limits. IMPRESSION: 1. Acute lacunar infarct of the posterior Left corona radiata and posterior lentiform, with two small superimposed small acute white matter infarcts in the posterior Left MCA territory, including near the left motor strip. No associated hemorrhage or mass effect. 2. Intracranial MRA is negative for large vessel occlusion but positive for moderate stenoses of the both the distal Left A2 and Left anterior M2 branches. 3. Underlying moderate chronic small vessel disease. Electronically Signed   By: Genevie Ann M.D.   On: 11/18/2018 22:10   Mr Virgel Paling RC Contrast  Result Date: 11/18/2018 CLINICAL DATA:  85 year old female with right extremity weakness on  waking today. EXAM: MRI HEAD WITHOUT CONTRAST MRA HEAD WITHOUT CONTRAST TECHNIQUE: Multiplanar, multiecho pulse sequences of the brain and surrounding structures were obtained without intravenous contrast. Angiographic images of the head were obtained using MRA technique without contrast. COMPARISON:  Head CT without contrast earlier today. FINDINGS: MRI HEAD FINDINGS Brain: Curvilinear restricted diffusion in the posterior left corona radiata tracking to the posterior lentiform. There also is 2 more subtle areas of subcortical white matter restricted diffusion in the posterosuperior left frontal lobe (near the motor strip) and in the right parietal lobe (series 5, image 77). Mild associated T2 and FLAIR hyperintensity with no associated hemorrhage or mass effect. No  contralateral right hemisphere or posterior fossa restricted diffusion. Patchy bilateral cerebral white matter T2 and FLAIR hyperintensity, which in some areas outside of the above findings also resembles (chronic) small vessel infarcts. Occasional chronic micro hemorrhages. Mild to moderate for age T2 heterogeneity in the deep gray matter nuclei. The brainstem and cerebellum are relatively spared. No midline shift, mass effect, evidence of mass lesion, ventriculomegaly, extra-axial collection or acute intracranial hemorrhage. Cervicomedullary junction and pituitary are within normal limits. Vascular: Major intracranial vascular flow voids are preserved. Mild generalized intracranial artery tortuosity. The distal left vertebral artery appears dominant. Skull and upper cervical spine: Visualized bone marrow signal is within normal limits. Negative for age visible cervical spine. Sinuses/Orbits: Postoperative changes to both globes, otherwise negative. Other: Trace right mastoid fluid. Negative scalp and face soft tissues. MRA HEAD FINDINGS Antegrade flow in the posterior circulation with dominant distal left vertebral artery. No distal vertebral stenosis. The left PICA origin is patent. Patent basilar artery without stenosis. Normal SCA and PCA origins. Posterior communicating arteries are diminutive or absent. Bilateral PCA branches are mildly irregular. Antegrade flow in both ICA siphons. Mild siphon irregularity. No siphon stenosis. Patent carotid termini, MCA and ACA origins. Anterior communicating artery is diminutive or absent. There is a moderate to severe stenosis of the left ACA distal A2 segment (series 1030 image 6) but preserved bilateral distal ACA flow signal. The left MCA M1 segment bifurcates early. There is mild to moderate irregularity and stenosis in the proximal anterior M2 branch. Other visible left MCA branches are within normal limits. Right MCA M1, bifurcation, and visible right MCA branches  are within normal limits. IMPRESSION: 1. Acute lacunar infarct of the posterior Left corona radiata and posterior lentiform, with two small superimposed small acute white matter infarcts in the posterior Left MCA territory, including near the left motor strip. No associated hemorrhage or mass effect. 2. Intracranial MRA is negative for large vessel occlusion but positive for moderate stenoses of the both the distal Left A2 and Left anterior M2 branches. 3. Underlying moderate chronic small vessel disease. Electronically Signed   By: Genevie Ann M.D.   On: 11/18/2018 22:10   Vas US Carotid (at [place] Only)  Result Date: 11/20/2018 Carotid Arterial Duplex Study Indications:       CVA and Weakness. Comparison Study:  No prior study on file Performing Technologist: Sharion Dove RVS  Examination Guidelines: A complete evaluation includes B-mode imaging, spectral Doppler, color Doppler, and power Doppler as needed of all accessible portions of each vessel. Bilateral testing is considered an integral part of a complete examination. Limited examinations for reoccurring indications may be performed as noted.  Right Carotid Findings: +----------+--------+--------+--------+------------+------------------+           PSV cm/sEDV cm/sStenosisDescribe    Comments           +----------+--------+--------+--------+------------+------------------+  CCA Prox  59      12                          intimal thickening +----------+--------+--------+--------+------------+------------------+ CCA Distal57      12                          intimal thickening +----------+--------+--------+--------+------------+------------------+ ICA Prox  50      16              heterogenous                   +----------+--------+--------+--------+------------+------------------+ ICA Distal42      10                                             +----------+--------+--------+--------+------------+------------------+ ECA        77      12                                             +----------+--------+--------+--------+------------+------------------+ +----------+--------+-------+--------+-------------------+           PSV cm/sEDV cmsDescribeArm Pressure (mmHG) +----------+--------+-------+--------+-------------------+ [id number]      14                                 +----------+--------+-------+--------+-------------------+ +---------+--------+--+--------+-+ VertebralPSV cm/s47EDV cm/s8 +---------+--------+--+--------+-+  Left Carotid Findings: +----------+--------+--------+--------+------------+--------+           PSV cm/sEDV cm/sStenosisDescribe    Comments +----------+--------+--------+--------+------------+--------+ CCA Prox  48      7               homogeneous          +----------+--------+--------+--------+------------+--------+ CCA Distal76      15              homogeneous          +----------+--------+--------+--------+------------+--------+ ICA Prox  55      14              heterogenous         +----------+--------+--------+--------+------------+--------+ ICA Distal47      10                                   +----------+--------+--------+--------+------------+--------+ ECA       73      10                                   +----------+--------+--------+--------+------------+--------+ +----------+--------+--------+--------+-------------------+ SubclavianPSV cm/sEDV cm/sDescribeArm Pressure (mmHG) +----------+--------+--------+--------+-------------------+           22                                          +----------+--------+--------+--------+-------------------+ +---------+--------+--+--------+--+ VertebralPSV cm/s57EDV cm/s12 +---------+--------+--+--------+--+  Summary: Right Carotid: The extracranial vessels were near-normal with only minimal wall  thickening or plaque. Left Carotid: The extracranial vessels were  near-normal with only minimal wall               thickening or plaque. Vertebrals:  Bilateral vertebral arteries demonstrate antegrade flow. Subclavians: Normal flow hemodynamics were seen in bilateral subclavian              arteries. *See table(s) above for measurements and observations.  Electronically signed by Antony Contras MD on 11/20/2018 at 1:54:11 PM.    Final    Echocardiogram 2/2  1. The left ventricle has normal systolic function of 74-12%. The cavity size is normal. There is no left ventricular wall thickness. Echo evidence of impaired relaxation diastolic filling patterns. Normal left ventricular filling pressures.  2. Normal left atrial size.  3. Normal right atrial size.  4. The aortic valve is tricuspid. There is mild thickening and sclerosis without any evidence of stenosis of the aortic valve.  5. No atrial level shunt detected by color flow Doppler.   TEE 2/3 Study Conclusions  - Left ventricle: The cavity size was normal. Wall thickness was   normal. Systolic function was normal. The estimated ejection   fraction was in the range of 55% to 60%. - Aortic valve: No evidence of vegetation. - Mitral valve: No evidence of vegetation. There was mild   regurgitation. - Left atrium: No evidence of thrombus in the atrial cavity or   appendage. No evidence of thrombus in the appendage. - Right atrium: No evidence of thrombus in the atrial cavity or   appendage. - Atrial septum: No defect or patent foramen ovale was identified.   Echo contrast study showed no right-to-left atrial level shunt,   at baseline or with provocation. - Tricuspid valve: No evidence of vegetation. - Pulmonic valve: No evidence of vegetation. - Superior vena cava: The study excluded a thrombus.  Impressions:  - No cardiac source of emboli was indentified.      Subjective: Feels arm is getting better.  No confusion, seizure, headache.  No vomiting.  No fever.  No chest pain,  dyspnea.  Discharge Exam: Vitals:   11/20/18 1520 11/20/18 1530  BP: (!) 211/60 (!) 205/68  Pulse: 67 (!) 59  Resp: (!) 22 19  Temp:  97.9 F (36.6 C)  SpO2: 99% 98%   Vitals:   11/20/18 1505 11/20/18 1510 11/20/18 1520 11/20/18 1530  BP: (!) 224/58 (!) 213/57 (!) 211/60 (!) 205/68  Pulse: 63 66 67 (!) 59  Resp: 14 14 (!) 22 19  Temp:    97.9 F (36.6 C)  TempSrc:    Oral  SpO2: 100% 100% 99% 98%  Weight:      Height:        General: Pt is alert, awake, not in acute distress, sitting in recliner Cardiovascular: RRR, nl S1-S2, no murmurs appreciated.   No LE edema.   Respiratory: Normal respiratory rate and rhythm.  CTAB without rales or wheezes. Abdominal: Abdomen soft and non-tender.  No distension or HSM.   Neuro/Psych: Strength diminished in right upper and lower extremity relative to left.  Speech normal.  Judgment and insight appear normal.   The results of significant diagnostics from this hospitalization (including imaging, microbiology, ancillary and laboratory) are listed below for reference.     Microbiology: No results found for this or any previous visit (from the past 240 hour(s)).   Labs: BNP (last 3 results) No results for input(s): BNP in the last 8760 hours. Basic Metabolic Panel:  Recent Labs  Lab 11/18/18 1416  NA 143  K 3.5  CL 109  CO2 25  GLUCOSE 99  BUN 16  CREATININE 0.90  CALCIUM 9.2   Liver Function Tests: Recent Labs  Lab 11/18/18 1416  AST 26  ALT 20  ALKPHOS 25*  BILITOT 0.7  PROT 7.7  ALBUMIN 4.4   No results for input(s): LIPASE, AMYLASE in the last 168 hours. No results for input(s): AMMONIA in the last 168 hours. CBC: Recent Labs  Lab 11/18/18 1416  WBC 9.4  NEUTROABS 6.7  HGB 14.4  HCT 43.9  MCV 101.2*  PLT 254   Cardiac Enzymes: No results for input(s): CKTOTAL, CKMB, CKMBINDEX, TROPONINI in the last 168 hours. BNP: Invalid input(s): POCBNP CBG: Recent Labs  Lab 11/18/18 1454  GLUCAP 91    D-Dimer No results for input(s): DDIMER in the last 72 hours. Hgb A1c Recent Labs    11/19/18 0535  HGBA1C 5.2   Lipid Profile Recent Labs    11/19/18 0535  CHOL 191  HDL 54  LDLCALC 126*  TRIG 57  CHOLHDL 3.5   Thyroid function studies No results for input(s): TSH, T4TOTAL, T3FREE, THYROIDAB in the last 72 hours.  Invalid input(s): FREET3 Anemia work up No results for input(s): VITAMINB12, FOLATE, FERRITIN, TIBC, IRON, RETICCTPCT in the last 72 hours. Urinalysis    Component Value Date/Time   COLORURINE YELLOW 03/22/2017 [place] 03/22/2017 1607   LABSPEC 1.020 03/22/2017 1607   PHURINE 5.5 03/22/2017 1607   GLUCOSEU NEGATIVE 03/22/2017 1607   HGBUR NEGATIVE 03/22/2017 1607   HGBUR moderate 01/04/2011 1323   BILIRUBINUR neg 08/29/2017 1221   KETONESUR TRACE (A) 03/22/2017 1607   PROTEINUR neg 08/29/2017 1221   UROBILINOGEN 0.2 08/29/2017 1221   UROBILINOGEN 0.2 03/22/2017 1607   NITRITE neg 08/29/2017 1221   NITRITE NEGATIVE 03/22/2017 1607   LEUKOCYTESUR Negative 08/29/2017 1221   Sepsis Labs Invalid input(s): PROCALCITONIN,  WBC,  LACTICIDVEN Microbiology No results found for this or any previous visit (from the past 240 hour(s)).   Time coordinating discharge: 40 minutes       SIGNED:   Edwin Dada, MD  [facility] 11/20/2018, 4:16 PM

## 2018-11-20 NOTE — Progress Notes (Signed)
  Echocardiogram 2D Echocardiogram has been performed.  [name] [name] 11/20/2018, 3:18 PM

## 2018-11-20 NOTE — H&P (Signed)
Physical Medicine and Rehabilitation Admission H&P    Chief Complaint  Patient presents with  . Extremity Weakness  : HPI: [name] [name] is an 85 year old right-handed female history of hypothyroidism. Per chart review she lives alone. One level home with level entry. Independent and active prior to admission. She has a son who is retired and offers good support.Presented 11/18/2018 with right-sided weakness following to the floor. Cranial CT scan showed a small linear right left frontal white matter hypodensity extending to the cortex. No hemorrhage. Patient did not receive TPA. MRI/MRA showed acute lacunar infarction in the posterior left corona radiata and posterior lentiform with 2 small superimposed small acute white matter infarctions in the posterior left MCA territory. No large vessel occlusion but positive for moderate stenosis of both the distal left A2 and left anterior M2 branches. Echocardiogram with ejection fraction of 65%. Systolic function was normal. Carotid Dopplers with no ICA stenosis. Neurology follow-up maintain on aspirin for CVA prophylaxis. TEE is pending. Subcutaneous Lovenox for DVT prophylaxis. Therapy evaluations completed with recommendations of physical medicine rehabilitation consult. Patient was admitted for a rehabilitation program.  Review of Systems  Constitutional: Negative for chills and fever.  HENT: Negative for hearing loss.   Eyes: Negative for blurred vision and double vision.  Respiratory: Negative for shortness of breath.   Cardiovascular: Negative for chest pain and palpitations.  Gastrointestinal: Positive for constipation. Negative for nausea.  Genitourinary: Negative for dysuria, flank pain and hematuria.  Musculoskeletal: Positive for joint pain and myalgias.  Skin: Negative for rash.  Neurological: Positive for focal weakness.  Psychiatric/Behavioral: Positive for depression. The patient has insomnia.   All other systems reviewed and  are negative.  Past Medical History:  Diagnosis Date  . Anemia    h/o fibroids  . Bipolar disorder ([place])   . Depression    bipolar  . Eczema   . Hypothyroidism   . Insomnia   . Osteoarthritis   . Osteoporosis   . Psoriasis    Past Surgical History:  Procedure Laterality Date  . ABDOMINAL HYSTERECTOMY  1982  . APPENDECTOMY  2003  . BREAST LUMPECTOMY  1947   left  . CATARACT EXTRACTION  2010  . EYE SURGERY    . HERNIA REPAIR    . KNEE ARTHROSCOPY  09/07/11   right knee  . NASAL SEPTUM SURGERY  1970's  . TONSILLECTOMY  1970's  . TONSILLECTOMY    . TOTAL KNEE ARTHROPLASTY Right 03/04/2015   Procedure: RIGHT TOTAL KNEE ARTHROPLASTY;  Surgeon: Mcarthur Rossetti, MD;  Location: [facility];  Service: Orthopedics;  Laterality: Right;  . VENTRAL HERNIA REPAIR  2005   Family History  Problem Relation Age of Onset  . Colon cancer Brother   . Lung cancer Father   . Prostate cancer Father   . Arthritis Father   . Cancer Father        lung, prostate  . Heart disease Brother   . Cancer Brother        lung  . Lung cancer Brother   . Aortic aneurysm Brother   . Cancer Other        colon   Social History:  reports that she quit smoking about 33 years ago. She has never used smokeless tobacco. She reports that she does not drink alcohol or use drugs. Allergies:  Allergies  Allergen Reactions  . Bupropion Hcl Rash   Medications Prior to Admission  Medication Sig Dispense Refill  . ammonium  lactate (LAC-HYDRIN) 12 % cream Apply topically as needed for dry skin. 385 g 0  . ARIPiprazole (ABILIFY) 5 MG tablet Take 1 tablet by mouth daily.    . calcium carbonate (OS-CAL) 600 MG TABS Take 600 mg by mouth 2 (two) times daily with a meal.      . cholecalciferol (VITAMIN D) 1000 UNITS tablet Take 3,000 Units by mouth daily.     . Cyanocobalamin (B-12) 2500 MCG TABS Take 1 tablet by mouth daily.    Marland Kitchen docusate sodium (COLACE) 100 MG capsule Take 1 capsule (100 mg total) by mouth 2 (two)  times daily. (Patient taking differently: Take 100 mg by mouth daily as needed for mild constipation. ) 10 capsule 0  . Glucosamine 500 MG TABS Take 1,000 mg by mouth daily.     . hypromellose (GENTEAL) 0.3 % GEL ophthalmic ointment Place 1 application into both eyes at bedtime.    Marland Kitchen levothyroxine (SYNTHROID, LEVOTHROID) 88 MCG tablet Take 1 tablet (88 mcg total) by mouth daily. 90 tablet 1  . Lutein 40 MG CAPS Take 20 mg by mouth daily.     . Melatonin 5 MG TABS Take 15 mg by mouth at bedtime.    . multivitamin (THERAGRAN) per tablet Take 1 tablet by mouth daily.      . valACYclovir (VALTREX) 500 MG tablet Take 500 mg by mouth daily.    Marland Kitchen zolpidem (AMBIEN) 10 MG tablet Take 10 mg by mouth at bedtime.       Drug Regimen Review Drug regimen was reviewed and remains appropriate with no significant issues identified  Home: Home Living Family/patient expects to be discharged to:: Private residence Living Arrangements: Alone Available Help at Discharge: Family, Available PRN/intermittently Type of Home: House Home Access: Level entry Home Layout: One level Bathroom Shower/Tub: Multimedia programmer: Handicapped height Home Equipment: Environmental consultant - 2 wheels, Shower seat, Bedside commode   Functional History: Prior Function Level of Independence: Needs assistance Gait / Transfers Assistance Needed: Walks independently. ADL's / Homemaking Assistance Needed: Does own ADLs. Son helps with grocery shopping/drives to appts. Comments: Does water aerobics 3 times/week.   Functional Status:  Mobility: Bed Mobility Overal bed mobility: Needs Assistance Bed Mobility: Supine to Sit Supine to sit: Min assist, HOB elevated General bed mobility comments: Min A to initating trunk into upright position Transfers Overall transfer level: Needs assistance Equipment used: Rolling walker (2 wheeled) Transfers: Sit to/from Stand, [employer] Transfers Sit to Stand: Mod assist, From elevated  surface Stand pivot transfers: Mod assist General transfer comment: Mod A to power up into standing and then maintain balance during pivot to recliner Ambulation/Gait Ambulation/Gait assistance: Mod assist Gait Distance (Feet): 12 Feet(x2 bouts) Assistive device: (rail in hallway) Gait Pattern/deviations: Step-to pattern, Step-through pattern, Ataxic, Narrow base of support, Decreased weight shift to right General Gait Details: Slow, unsteady gait with difficulty progressing RLE, assist to weight shift and for trunk control. Right knee instability and buckling x3. 1 seated rest break. Gait velocity: decreased Gait velocity interpretation: <1.8 ft/sec, indicate of risk for recurrent falls    ADL: ADL Overall ADL's : Needs assistance/impaired Eating/Feeding: Set up, Sitting Grooming: Minimal assistance, Sitting, Oral care, Wash/dry face, Brushing hair, Standing, Cueing for sequencing(Applying lotion to face) Grooming Details (indicate cue type and reason): Pt performing oral care and applying lotion while seated at EOB with Min A for faciltiating normalized movement of RUE. Pt with poor grasp strength and requiring assistance to maintain grasp on tooth brush  for oral care. Pt standing at sink with Max A for balance and lateral lean to right; brushed her hair and rubbing in lotion on face.  Upper Body Bathing: Minimal assistance, Sitting Lower Body Bathing: Moderate assistance, Sitting/lateral leans, Sit to/from stand Upper Body Dressing : Minimal assistance, Sitting Lower Body Dressing: Minimal assistance(sitting in recliner to apply lotion) Lower Body Dressing Details (indicate cue type and reason): While seated in recliner, Pt applying lotion to BLEs. Required assistance for distal parts of BLEs. using BUEs and crossing midline.  Toilet Transfer: Moderate assistance, Stand-pivot(simulated to recliner) Toilet Transfer Details (indicate cue type and reason): Requiring Mod A to power up into  standing and then maintain balance to pivot.  Toileting- Clothing Manipulation and Hygiene: Moderate assistance, Sitting/lateral lean, Sit to/from stand, Cueing for safety, Cueing for sequencing Functional mobility during ADLs: Moderate assistance, Cueing for safety(stand pivot only) General ADL Comments: Pt highly motivated and has decreased balance, RUE function, strength, and cognition.  Cognition: Cognition Overall Cognitive Status: History of cognitive impairments - at baseline Orientation Level: Oriented X4 Cognition Arousal/Alertness: Awake/alert Behavior During Therapy: WFL for tasks assessed/performed Overall Cognitive Status: History of cognitive impairments - at baseline Area of Impairment: Following commands, Problem solving, Awareness Following Commands: Follows one step commands with increased time Awareness: Emergent Problem Solving: Slow processing, Requires verbal cues General Comments: Some memory deficits at baseline per son. Pt requiring increased time and cues during ADLs.   Physical Exam: Blood pressure (!) 165/75, pulse 73, temperature 98.4 F (36.9 C), temperature source Oral, resp. rate 13, SpO2 96 %. Physical Exam  Constitutional: She is oriented to person, place, and time. She appears well-nourished.  HENT:  Head: Normocephalic.  Eyes: Pupils are equal, round, and reactive to light.  Neck: Normal range of motion.  Cardiovascular: Normal rate. Exam reveals no friction rub.  No murmur heard. Respiratory: Effort normal. No respiratory distress. She has no wheezes.  GI: Soft. She exhibits no distension.  Musculoskeletal: Normal range of motion.  Neurological: She is alert and oriented to person, place, and time.  Patient is alert. Setting up an edge of bed. Makes good eye contact with examiner. Follows full commands. Provides her name and age as well as date of birth. Fair awareness of deficits. Right hemiparesis  Skin: Skin is warm.  Psychiatric: She has a  normal mood and affect. Her behavior is normal.    Results for orders placed or performed during the hospital encounter of 11/18/18 (from the past 48 hour(s))  Protime-INR     Status: None   Collection Time: 11/18/18  2:16 PM  Result Value Ref Range   Prothrombin Time 12.1 11.4 - 15.2 seconds   INR 0.90     Comment: Performed at [facility], [place] [address] [place], [place] [address]  APTT     Status: None   Collection Time: 11/18/18  2:16 PM  Result Value Ref Range   aPTT 28 24 - 36 seconds    Comment: Performed at [facility], [place] [address] [place], [place] [address]  CBC     Status: Abnormal   Collection Time: 11/18/18  2:16 PM  Result Value Ref Range   WBC 9.4 4.0 - 10.5 K/uL   RBC 4.34 3.87 - 5.11 MIL/uL   Hemoglobin 14.4 12.0 - 15.0 g/dL   HCT 43.9 36.0 - 46.0 %   MCV 101.2 (H) 80.0 - 100.0 fL   MCH 33.2 26.0 - 34.0 pg   MCHC  32.8 30.0 - 36.0 g/dL   RDW 13.3 11.5 - 15.5 %   Platelets 254 150 - 400 K/uL   nRBC 0.0 0.0 - 0.2 %    Comment: Performed at [facility], [place] [address] [place], [place] [address]  Differential     Status: None   Collection Time: 11/18/18  2:16 PM  Result Value Ref Range   Neutrophils Relative % 70 %   Neutro Abs 6.7 1.7 - 7.7 K/uL   Lymphocytes Relative 19 %   Lymphs Abs 1.8 0.7 - 4.0 K/uL   Monocytes Relative 9 %   Monocytes Absolute 0.8 0.1 - 1.0 K/uL   Eosinophils Relative 0 %   Eosinophils Absolute 0.0 0.0 - 0.5 K/uL   Basophils Relative 1 %   Basophils Absolute 0.1 0.0 - 0.1 K/uL   Immature Granulocytes 1 %   Abs Immature Granulocytes 0.05 0.00 - 0.07 K/uL    Comment: Performed at [facility], [place] [address] [place], [place] [address]  Comprehensive metabolic panel     Status: Abnormal   Collection Time: 11/18/18  2:16 PM  Result Value Ref Range   Sodium 143 135 - 145 mmol/L   Potassium 3.5 3.5 - 5.1 mmol/L   Chloride 109 98 - 111 mmol/L   CO2 25 22  - 32 mmol/L   Glucose, Bld 99 70 - 99 mg/dL   BUN 16 8 - 23 mg/dL   Creatinine, Ser 0.90 0.44 - 1.00 mg/dL   Calcium 9.2 8.9 - 10.3 mg/dL   Total Protein 7.7 6.5 - 8.1 g/dL   Albumin 4.4 3.5 - 5.0 g/dL   AST 26 15 - 41 U/L   ALT 20 0 - 44 U/L   Alkaline Phosphatase 25 (L) 38 - 126 U/L   Total Bilirubin 0.7 0.3 - 1.2 mg/dL   GFR calc non Af Amer 59 (L) >60 mL/min   GFR calc Af Amer >60 >60 mL/min   Anion gap 9 5 - 15    Comment: Performed at [facility], [place] [address], [place], [place] [address]  CBG monitoring, ED     Status: None   Collection Time: 11/18/18  2:54 PM  Result Value Ref Range   Glucose-Capillary 91 70 - 99 mg/dL  I-stat troponin, ED     Status: None   Collection Time: 11/18/18  3:02 PM  Result Value Ref Range   Troponin i, poc 0.00 0.00 - 0.08 ng/mL   Comment 3            Comment: Due to the release kinetics of cTnI, a negative result within the first hours of the onset of symptoms does not rule out myocardial infarction with certainty. If myocardial infarction is still suspected, repeat the test at appropriate intervals.   Hemoglobin A1c     Status: None   Collection Time: 11/19/18  5:35 AM  Result Value Ref Range   Hgb A1c MFr Bld 5.2 4.8 - 5.6 %    Comment: (NOTE) Pre diabetes:          5.7%-6.4% Diabetes:              >6.4% Glycemic control for   <7.0% adults with diabetes    Mean Plasma Glucose 102.54 mg/dL    Comment: Performed at [place] [address], [place], [place] [address]  Lipid panel     Status: Abnormal   Collection Time: 11/19/18  5:35 AM  Result Value Ref Range   Cholesterol 191 0 - 200 mg/dL   Triglycerides 57 <150 mg/dL   HDL 54 >40 mg/dL   Total CHOL/HDL Ratio 3.5 RATIO   VLDL 11 0 - 40 mg/dL   LDL Cholesterol 126 (H) 0 - 99 mg/dL    Comment:        Total Cholesterol/HDL:CHD Risk Coronary Heart Disease Risk Table                     Men   Women  1/2 Average Risk   3.4   3.3  Average Risk        5.0   4.4  2 X Average Risk   9.6   7.1  3 X Average Risk  23.4   11.0        Use the calculated Patient Ratio above and the CHD Risk Table to determine the patient's CHD Risk.        ATP III CLASSIFICATION (LDL):  <100     mg/dL   Optimal  100-129  mg/dL   Near or Above                    Optimal  130-159  mg/dL   Borderline  160-189  mg/dL   High  >190     mg/dL   Very High Performed at [place] [address], [place], [place] [address]    Dg Chest 2 View  Result Date: 11/18/2018 CLINICAL DATA:  Pt had a stroke last night and awoke this AM with right side deficits including numbness in the right upper extremity and weakness in the right lower extremity. Pt denies chest pain, SOB, and headache. EXAM: CHEST - 2 VIEW COMPARISON:  12/09/2017 FINDINGS: Cardiac silhouette is normal in size. No mediastinal or hilar masses. No evidence of adenopathy stable aortic atherosclerosis. Lungs are hyperexpanded but clear. No pleural effusion or pneumothorax. Skeletal structures are demineralized but intact. IMPRESSION: No acute cardiopulmonary disease. Electronically Signed   By: Lajean Manes M.D.   On: 11/18/2018 19:46   Ct Head Wo Contrast  Result Date: 11/18/2018 CLINICAL DATA:  85 year old female with acute RIGHT arm and leg weakness. EXAM: CT HEAD WITHOUT CONTRAST TECHNIQUE: Contiguous axial images were obtained from the base of the skull through the vertex without intravenous contrast. COMPARISON:  10/15/2014 CT FINDINGS: Brain: A small linear high LEFT frontal white matter hypodensity extends to the cortex and may represent an infarct of uncertain chronicity. No evidence of hemorrhage, hydrocephalus, extra-axial collection or mass lesion/mass effect. Chronic small-vessel white matter ischemic changes are noted. Vascular: Carotid atherosclerotic calcifications identified. Skull: No acute or suspicious bony abnormalities. Sinuses/Orbits: No acute finding. Other: None IMPRESSION: 1. Small  linear high LEFT frontal white matter hypodensity extending to the cortex-question infarct of uncertain chronicity. No hemorrhage. 2. Chronic small-vessel white matter ischemic changes. Electronically Signed   By: Margarette Canada M.D.   On: 11/18/2018 15:02   Mr Brain Wo Contrast  Result Date: 11/18/2018 CLINICAL DATA:  85 year old female with right extremity weakness on waking today. EXAM: MRI HEAD WITHOUT CONTRAST MRA HEAD WITHOUT CONTRAST TECHNIQUE: Multiplanar, multiecho pulse sequences of the brain and surrounding structures were obtained without intravenous contrast. Angiographic images of the head were obtained using MRA technique without contrast. COMPARISON:  Head CT without contrast earlier today. FINDINGS: MRI HEAD FINDINGS Brain: Curvilinear restricted diffusion in the posterior left corona radiata tracking to the posterior lentiform. There also is  2 more subtle areas of subcortical white matter restricted diffusion in the posterosuperior left frontal lobe (near the motor strip) and in the right parietal lobe (series 5, image 77). Mild associated T2 and FLAIR hyperintensity with no associated hemorrhage or mass effect. No contralateral right hemisphere or posterior fossa restricted diffusion. Patchy bilateral cerebral white matter T2 and FLAIR hyperintensity, which in some areas outside of the above findings also resembles (chronic) small vessel infarcts. Occasional chronic micro hemorrhages. Mild to moderate for age T2 heterogeneity in the deep gray matter nuclei. The brainstem and cerebellum are relatively spared. No midline shift, mass effect, evidence of mass lesion, ventriculomegaly, extra-axial collection or acute intracranial hemorrhage. Cervicomedullary junction and pituitary are within normal limits. Vascular: Major intracranial vascular flow voids are preserved. Mild generalized intracranial artery tortuosity. The distal left vertebral artery appears dominant. Skull and upper cervical spine:  Visualized bone marrow signal is within normal limits. Negative for age visible cervical spine. Sinuses/Orbits: Postoperative changes to both globes, otherwise negative. Other: Trace right mastoid fluid. Negative scalp and face soft tissues. MRA HEAD FINDINGS Antegrade flow in the posterior circulation with dominant distal left vertebral artery. No distal vertebral stenosis. The left PICA origin is patent. Patent basilar artery without stenosis. Normal SCA and PCA origins. Posterior communicating arteries are diminutive or absent. Bilateral PCA branches are mildly irregular. Antegrade flow in both ICA siphons. Mild siphon irregularity. No siphon stenosis. Patent carotid termini, MCA and ACA origins. Anterior communicating artery is diminutive or absent. There is a moderate to severe stenosis of the left ACA distal A2 segment (series 1030 image 6) but preserved bilateral distal ACA flow signal. The left MCA M1 segment bifurcates early. There is mild to moderate irregularity and stenosis in the proximal anterior M2 branch. Other visible left MCA branches are within normal limits. Right MCA M1, bifurcation, and visible right MCA branches are within normal limits. IMPRESSION: 1. Acute lacunar infarct of the posterior Left corona radiata and posterior lentiform, with two small superimposed small acute white matter infarcts in the posterior Left MCA territory, including near the left motor strip. No associated hemorrhage or mass effect. 2. Intracranial MRA is negative for large vessel occlusion but positive for moderate stenoses of the both the distal Left A2 and Left anterior M2 branches. 3. Underlying moderate chronic small vessel disease. Electronically Signed   By: Genevie Ann M.D.   On: 11/18/2018 22:10   Mr Virgel Paling JM Contrast  Result Date: 11/18/2018 CLINICAL DATA:  85 year old female with right extremity weakness on waking today. EXAM: MRI HEAD WITHOUT CONTRAST MRA HEAD WITHOUT CONTRAST TECHNIQUE: Multiplanar,  multiecho pulse sequences of the brain and surrounding structures were obtained without intravenous contrast. Angiographic images of the head were obtained using MRA technique without contrast. COMPARISON:  Head CT without contrast earlier today. FINDINGS: MRI HEAD FINDINGS Brain: Curvilinear restricted diffusion in the posterior left corona radiata tracking to the posterior lentiform. There also is 2 more subtle areas of subcortical white matter restricted diffusion in the posterosuperior left frontal lobe (near the motor strip) and in the right parietal lobe (series 5, image 77). Mild associated T2 and FLAIR hyperintensity with no associated hemorrhage or mass effect. No contralateral right hemisphere or posterior fossa restricted diffusion. Patchy bilateral cerebral white matter T2 and FLAIR hyperintensity, which in some areas outside of the above findings also resembles (chronic) small vessel infarcts. Occasional chronic micro hemorrhages. Mild to moderate for age T2 heterogeneity in the deep gray matter nuclei. The brainstem and  cerebellum are relatively spared. No midline shift, mass effect, evidence of mass lesion, ventriculomegaly, extra-axial collection or acute intracranial hemorrhage. Cervicomedullary junction and pituitary are within normal limits. Vascular: Major intracranial vascular flow voids are preserved. Mild generalized intracranial artery tortuosity. The distal left vertebral artery appears dominant. Skull and upper cervical spine: Visualized bone marrow signal is within normal limits. Negative for age visible cervical spine. Sinuses/Orbits: Postoperative changes to both globes, otherwise negative. Other: Trace right mastoid fluid. Negative scalp and face soft tissues. MRA HEAD FINDINGS Antegrade flow in the posterior circulation with dominant distal left vertebral artery. No distal vertebral stenosis. The left PICA origin is patent. Patent basilar artery without stenosis. Normal SCA and PCA  origins. Posterior communicating arteries are diminutive or absent. Bilateral PCA branches are mildly irregular. Antegrade flow in both ICA siphons. Mild siphon irregularity. No siphon stenosis. Patent carotid termini, MCA and ACA origins. Anterior communicating artery is diminutive or absent. There is a moderate to severe stenosis of the left ACA distal A2 segment (series 1030 image 6) but preserved bilateral distal ACA flow signal. The left MCA M1 segment bifurcates early. There is mild to moderate irregularity and stenosis in the proximal anterior M2 branch. Other visible left MCA branches are within normal limits. Right MCA M1, bifurcation, and visible right MCA branches are within normal limits. IMPRESSION: 1. Acute lacunar infarct of the posterior Left corona radiata and posterior lentiform, with two small superimposed small acute white matter infarcts in the posterior Left MCA territory, including near the left motor strip. No associated hemorrhage or mass effect. 2. Intracranial MRA is negative for large vessel occlusion but positive for moderate stenoses of the both the distal Left A2 and Left anterior M2 branches. 3. Underlying moderate chronic small vessel disease. Electronically Signed   By: Genevie Ann M.D.   On: 11/18/2018 22:10   Vas US Carotid (at [facility] Only)  Result Date: 11/19/2018 Carotid Arterial Duplex Study Indications:       CVA and Weakness. Comparison Study:  No prior study on file Performing Technologist: Sharion Dove RVS  Examination Guidelines: A complete evaluation includes B-mode imaging, spectral Doppler, color Doppler, and power Doppler as needed of all accessible portions of each vessel. Bilateral testing is considered an integral part of a complete examination. Limited examinations for reoccurring indications may be performed as noted.  Right Carotid Findings: +----------+--------+--------+--------+------------+------------------+           PSV cm/sEDV cm/sStenosisDescribe     Comments           +----------+--------+--------+--------+------------+------------------+ CCA Prox  59      12                          intimal thickening +----------+--------+--------+--------+------------+------------------+ CCA Distal57      12                          intimal thickening +----------+--------+--------+--------+------------+------------------+ ICA Prox  50      16              heterogenous                   +----------+--------+--------+--------+------------+------------------+ ICA Distal42      10                                             +----------+--------+--------+--------+------------+------------------+  ECA       77      12                                             +----------+--------+--------+--------+------------+------------------+ +----------+--------+-------+--------+-------------------+           PSV cm/sEDV cmsDescribeArm Pressure (mmHG) +----------+--------+-------+--------+-------------------+ [id number]      14                                 +----------+--------+-------+--------+-------------------+ +---------+--------+--+--------+-+ VertebralPSV cm/s47EDV cm/s8 +---------+--------+--+--------+-+  Left Carotid Findings: +----------+--------+--------+--------+------------+--------+           PSV cm/sEDV cm/sStenosisDescribe    Comments +----------+--------+--------+--------+------------+--------+ CCA Prox  48      7               homogeneous          +----------+--------+--------+--------+------------+--------+ CCA Distal76      15              homogeneous          +----------+--------+--------+--------+------------+--------+ ICA Prox  55      14              heterogenous         +----------+--------+--------+--------+------------+--------+ ICA Distal47      10                                   +----------+--------+--------+--------+------------+--------+ ECA       73      10                                    +----------+--------+--------+--------+------------+--------+ +----------+--------+--------+--------+-------------------+ SubclavianPSV cm/sEDV cm/sDescribeArm Pressure (mmHG) +----------+--------+--------+--------+-------------------+           22                                          +----------+--------+--------+--------+-------------------+ +---------+--------+--+--------+--+ VertebralPSV cm/s57EDV cm/s12 +---------+--------+--+--------+--+  Summary: Right Carotid: The extracranial vessels were near-normal with only minimal wall                thickening or plaque. Left Carotid: The extracranial vessels were near-normal with only minimal wall               thickening or plaque. Vertebrals:  Bilateral vertebral arteries demonstrate antegrade flow. Subclavians: Normal flow hemodynamics were seen in bilateral subclavian              arteries. *See table(s) above for measurements and observations.     Preliminary        Medical Problem List and Plan: 1.  Right side weakness secondary to embolic left territory MCA infarction. Status post loop recorder  -admit to inpatient rehab  -ASA and plavix for 3 weeks then ASA 81mg  alone 2.  DVT Prophylaxis/Anticoagulation: subcutaneous Lovenox. Monitor for any bleeding episodes 3. Pain Management:  Tylenol as needed 4. Mood:  Abilify 5 mg daily, melatonin 15 mg daily at bedtime 5. Neuropsych: This patient is capable of making decisions on her own behalf. 6. Skin/Wound Care:  Routine skin checks 7.  Fluids/Electrolytes/Nutrition:  Routine in and out's with follow-up chemistries 8. Hypothyroidism. Synthroid 9. Hyperlipidemia. Lipitor   Post Admission Physician Evaluation: 1. Functional deficits secondary  to embolic left MCA infarct. 2. Patient is admitted to receive collaborative, interdisciplinary care between the physiatrist, rehab nursing staff, and therapy team. 3. Patient's level of medical  complexity and substantial therapy needs in context of that medical necessity cannot be provided at a lesser intensity of care such as a SNF. 4. Patient has experienced substantial functional loss from his/her baseline which was documented above under the "Functional History" and "Functional Status" headings.  Judging by the patient's diagnosis, physical exam, and functional history, the patient has potential for functional progress which will result in measurable gains while on inpatient rehab.  These gains will be of substantial and practical use upon discharge  in facilitating mobility and self-care at the household level. 5. Physiatrist will provide 24 hour management of medical needs as well as oversight of the therapy plan/treatment and provide guidance as appropriate regarding the interaction of the two. 6. The Preadmission Screening has been reviewed and patient status is unchanged unless otherwise stated above. 7. 24 hour rehab nursing will assist with bladder management, bowel management, safety, skin/wound care, disease management, medication administration, pain management and patient education  and help integrate therapy concepts, techniques,education, etc. 8. PT will assess and treat for/with: Lower extremity strength, range of motion, stamina, balance, functional mobility, safety, adaptive techniques and equipment, NMR, visual-spatial awareness.   Goals are: mod I to superision. 9. OT will assess and treat for/with: ADL's, functional mobility, safety, upper extremity strength, adaptive techniques and equipment, NMR, family ed.   Goals are: mod I to supervision. Therapy may proceed with showering this patient. 10. SLP will assess and treat for/with: cognition, speech, .  Goals are: mod I to supervision. 11. Case Management and Social Worker will assess and treat for psychological issues and discharge planning. 12. Team conference will be held weekly to assess progress toward goals and to  determine barriers to discharge. 13. Patient will receive at least 3 hours of therapy per day at least 5 days per week. 14. ELOS: 10-14 days       15. Prognosis:  excellent   I have personally performed a face to face diagnostic evaluation of this patient and formulated the key components of the plan.  Additionally, I have personally reviewed laboratory data, imaging studies, as well as relevant notes and concur with the physician assistant's documentation above.  Meredith Staggers, MD, FAAPMR    Lavon Paganini [place], PA-C 11/20/2018

## 2018-11-20 NOTE — Progress Notes (Signed)
Inpatient Rehabilitation Admissions Coordinator  I met with patient and her son at bedside to discuss goals and expectations of an inpt rehab admit. They prefer inpt rehab admit rather than SNF. Dr. Loleta Books clarified that once TEE and possible LOOP today, pt would then be ready for d/c to CIR. I will follow up after lunch to assist with planning admit.  Danne Baxter, RN, MSN Rehab Admissions Coordinator [phone] 11/20/2018 10:58 AM

## 2018-11-20 NOTE — Progress Notes (Signed)
Occupational Therapy Treatment Patient Details Name: [name] [name] MRN: [id number] DOB: 04/01/1933 Today's Date: 11/20/2018    History of present illness Patient is a 85 y/o female who presents with right sided weakness. NIH 3. Brain MRI- acute infarct posterior left corona radiata and posterior lentiform with two small superimposed small acute white matter infarcts in the posterior Left MCA territory. PMH includes depression, bipolar disorder.    OT comments  Pt progressing towards established OT goals. Pt highly motivated to participate in therapy and son is very supportive. Pt performing grooming tasks while seated at EOB and requiring Min A for incorporation of RUE and to facilitate grasp and normalized movement patterns. Pt wanting to stand at sink to see the mirror for brushing her hair. Required Max A for standing balance at sink. Continue to recommend dc to CIR and will continue to follow acutely as admitted.    Follow Up Recommendations  CIR;Supervision/Assistance - 24 hour    Equipment Recommendations  None recommended by OT    Recommendations for Other Services Rehab consult    Precautions / Restrictions Precautions Precautions: Fall Restrictions Weight Bearing Restrictions: No       Mobility Bed Mobility Overal bed mobility: Needs Assistance Bed Mobility: Supine to Sit     Supine to sit: Min assist;HOB elevated     General bed mobility comments: Min A to initating trunk into upright position  Transfers Overall transfer level: Needs assistance   Transfers: Sit to/from Stand;Stand Pivot Transfers Sit to Stand: Mod assist;From elevated surface Stand pivot transfers: Mod assist       General transfer comment: Mod A to power up into standing and then maintain balance during pivot to recliner    Balance Overall balance assessment: Needs assistance Sitting-balance support: Feet supported;No upper extremity supported Sitting balance-Leahy Scale: Fair      Standing balance support: During functional activity Standing balance-Leahy Scale: Poor Standing balance comment: requires external support for standing balance- leans to R                           ADL either performed or assessed with clinical judgement   ADL Overall ADL's : Needs assistance/impaired     Grooming: Minimal assistance;Sitting;Oral care;Wash/dry face;Brushing hair;Standing;Cueing for sequencing(Applying lotion to face) Grooming Details (indicate cue type and reason): Pt performing oral care and applying lotion while seated at EOB with Min A for faciltiating normalized movement of RUE. Pt with poor grasp strength and requiring assistance to maintain grasp on tooth brush for oral care. Pt standing at sink with Max A for balance and lateral lean to right; brushed her hair and rubbing in lotion on face.              Lower Body Dressing: Minimal assistance(sitting in recliner to apply lotion) Lower Body Dressing Details (indicate cue type and reason): While seated in recliner, Pt applying lotion to BLEs. Required assistance for distal parts of BLEs. using BUEs and crossing midline.  Toilet Transfer: Moderate assistance;Stand-pivot(simulated to recliner) Toilet Transfer Details (indicate cue type and reason): Requiring Mod A to power up into standing and then maintain balance to pivot.          Functional mobility during ADLs: Moderate assistance;Cueing for safety(stand pivot only) General ADL Comments: Pt highly motivated and has decreased balance, RUE function, strength, and cognition.     Vision   Vision Assessment?: No apparent visual deficits   Perception     Praxis  Cognition Arousal/Alertness: Awake/alert Behavior During Therapy: WFL for tasks assessed/performed Overall Cognitive Status: History of cognitive impairments - at baseline Area of Impairment: Following commands;Problem solving;Awareness                       Following  Commands: Follows one step commands with increased time   Awareness: Emergent Problem Solving: Slow processing;Requires verbal cues General Comments: Some memory deficits at baseline per son. Pt requiring increased time and cues during ADLs.         Exercises     Shoulder Instructions       General Comments Son present during session. VSS    Pertinent Vitals/ Pain       Pain Assessment: No/denies pain  Home Living                                          Prior Functioning/Environment              Frequency  Min 3X/week        Progress Toward Goals  OT Goals(current goals can now be found in the care plan section)  Progress towards OT goals: Progressing toward goals  Acute Rehab OT Goals Patient Stated Goal: to get back home and be independent OT Goal Formulation: With patient Time For Goal Achievement: 12/03/18 Potential to Achieve Goals: Good ADL Goals Pt Will Perform Grooming: with modified independence;sitting Pt Will Perform Upper Body Dressing: with set-up;sitting Pt Will Perform Lower Body Dressing: with min assist;sit to/from stand Pt Will Transfer to Toilet: with min guard assist Pt Will Perform Toileting - Clothing Manipulation and hygiene: with min assist;sit to/from stand Pt/caregiver will Perform Home Exercise Program: Right Upper extremity;With Supervision Additional ADL Goal #1: Pt will increase to fair dynamic sitting balance tolerating x10 mins  Plan Discharge plan remains appropriate    Co-evaluation                 AM-PAC OT "6 Clicks" Daily Activity     Outcome Measure   Help from another person eating meals?: A Little Help from another person taking care of personal grooming?: A Little Help from another person toileting, which includes using toliet, bedpan, or urinal?: A Lot Help from another person bathing (including washing, rinsing, drying)?: A Lot Help from another person to put on and taking off regular  upper body clothing?: A Little Help from another person to put on and taking off regular lower body clothing?: A Lot 6 Click Score: 15    End of Session Equipment Utilized During Treatment: Gait belt  OT Visit Diagnosis: Unsteadiness on feet (R26.81);Muscle weakness (generalized) (M62.81);Hemiplegia and hemiparesis Hemiplegia - Right/Left: Right Hemiplegia - dominant/non-dominant: Dominant Hemiplegia - caused by: Cerebral infarction   Activity Tolerance Patient tolerated treatment well   Patient Left in chair;with call bell/phone within reach;with chair alarm set;with family/visitor present   Nurse Communication Mobility status        Time: 4854-6270 OT Time Calculation (min): 33 min  Charges: OT General Charges $OT Visit: 1 Visit OT Treatments $Self Care/Home Management : 23-37 mins  [place], OTR/L Acute Rehab Pager: [phone] Office: [place] 11/20/2018, 10:58 AM

## 2018-11-20 NOTE — Progress Notes (Signed)
SLP Cancellation Note  Patient Details Name: [name] [name] MRN: [id number] DOB: 09-14-1933   Cancelled treatment:       Reason Eval/Treat Not Completed: Patient unavailable. Pt transferring to CIR following procedure today. Will defer cognitive-linguistic evaluation to rehab.  Celia B. Quentin Ore [facility], CCC-SLP Speech Language Pathologist [phone]  [name] [name] 11/20/2018, 11:06 AM
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
## 2018-11-20 NOTE — CV Procedure (Signed)
   Transesophageal Echocardiogram  Indications: Stroke  Time out performed  During this procedure the patient is administered a total of Versed 1 mg and Fentanyl 12.5 mcg to achieve and maintain moderate conscious sedation.  The patient's heart rate, blood pressure, and oxygen saturation are monitored continuously during the procedure. The period of conscious sedation is 25 minutes, of which I was present face-to-face 100% of this time.  Findings:  Left Ventricle: Normal EF 55%  Mitral Valve: No significant mitral valve regurgitation  Aortic Valve: No aortic valve regurgitation, mild calcification of aorta  Tricuspid Valve: No significant TR  Left Atrium: No left atrial appendage thrombus  Bubble Contrast Study: Negative bubble study with and without Valsalva.  Impression: No embolic source identified.  Candee Furbish, MD

## 2018-11-20 NOTE — H&P (View-Only) (Signed)
STROKE TEAM PROGRESS NOTE   SUBJECTIVE Patient is lying comfortably in bed. She is nothing by mouth and awaiting TEE. She has no complaints. I have reviewed in details patient's history of present illness.she feels that her right-sided weakness is improving OBJECTIVE Vitals:   11/20/18 0754 11/20/18 1210  BP: (!) 165/75 (!) 182/84  Pulse: 73 72  Resp: 13 (!) 22  Temp: 98.4 F (36.9 C) 98.4 F (36.9 C)  SpO2: 96% 97%     Physical Exam Frail elderly Caucasian lady currently not in distress.. . Afebrile. Head is nontraumatic. Neck is supple without bruit.    Cardiac exam no murmur or gallop. Lungs are clear to auscultation. Distal pulses are well felt. Neurological Exam ;  Awake  Alert oriented x 3. Normal speech and language.eye movements full without nystagmus.fundi were not visualized. Vision acuity and fields appear normal. Hearing is normal. Palatal movements are normal. Face symmetric. Tongue midline. Normal strength, tone, reflexes and coordination except mild right hemiparesis with weakness of right grip and intrinsic hand muscles. Orbits left over right upper extremity.. Normal sensation. Gait deferred.  Pertinent Laboratory Studies (past 3 days) / Diagnostics (past 24h) Recent Labs    11/18/18 1416  WBC 9.4  HGB 14.4  PLT 254  NA 143  K 3.5  CREATININE 0.90  GLUCOSE 99    No results found. Carotid ultrasound no significant bilateral extracranial carotid stenosis. 2-D echocardiogram normal ejection fraction of 60-65%. No cardiac source of embolism. LDL cholesterol 126 mg percent Hemoglobin A1c 5.2  ASSESSMENT Ms. CORIANN BROUHARD is a 85 y.o. female with  Right hemiparesis due to large left subcortical and smaller left MCA branch infarcts likely of embolic etiology.vascular risk factors of hyperlipidemia and age      RECOMMENDATIONS  I have personally obtained history,examined this patient, reviewed notes, independently viewed imaging studies, participated in  medical decision making and plan of care.ROS completed by me personally and pertinent positives fully documented  I have made any additions or clarifications directly to the above note.  Recommend dual antiplatelet therapy of aspirin and Plavix for 3 weeks followed by aspirin alone. Check transesophageal echocardiogram for cardiac source of embolism and if negative loop recorder insertion for paroxysmal A. Fib. Physical occupational therapy consults. Mobilize out of bed. Add statin for elevated lipids. No family available at the bedside for discussion. Discussed with patient and Dr. Loleta Books. Greater than 50% time during this 35 minute visit was spent on counseling and coordination of care about her cryptogenic stroke and answering questions discussion about plan of care   Hospital day # [place], [place] for Schedule & Pager information 11/20/2018 2:12 PM    To contact Stroke Continuity provider, please refer to [email or web address] After hours, contact General Neurology

## 2018-11-20 NOTE — Interval H&P Note (Signed)
History and Physical Interval Note:  11/20/2018 2:48 PM  [name] [name]  has presented today for surgery, with the diagnosis of stroke  The various methods of treatment have been discussed with the patient and family. After consideration of risks, benefits and other options for treatment, the patient has consented to  Procedure(s): TRANSESOPHAGEAL ECHOCARDIOGRAM (TEE) (N/A) as a surgical intervention .  The patient's history has been reviewed, patient examined, no change in status, stable for surgery.  I have reviewed the patient's chart and labs.  Questions were answered to the patient's satisfaction.     [employer]

## 2018-11-20 NOTE — Discharge Instructions (Signed)
Implant site care °Keep incision clean and dry for 3 days.  °You can remove outer dressing tomorrow. °Leave steri-strips (little pieces of tape) on until seen in the office for wound check appointment. °Call the office ([phone]) for redness, drainage, swelling, or fever. ° °

## 2018-11-20 NOTE — H&P (Signed)
Physical Medicine and Rehabilitation Admission H&P        Chief Complaint  Patient presents with  . Extremity Weakness  : HPI: [name] [name] is an 85 year old right-handed female history of hypothyroidism. Per chart review she lives alone. One level home with level entry. Independent and active prior to admission. She has a son who is retired and offers good support.Presented 11/18/2018 with right-sided weakness following to the floor. Cranial CT scan showed a small linear right left frontal white matter hypodensity extending to the cortex. No hemorrhage. Patient did not receive TPA. MRI/MRA showed acute lacunar infarction in the posterior left corona radiata and posterior lentiform with 2 small superimposed small acute white matter infarctions in the posterior left MCA territory. No large vessel occlusion but positive for moderate stenosis of both the distal left A2 and left anterior M2 branches. Echocardiogram with ejection fraction of 65%. Systolic function was normal. Carotid Dopplers with no ICA stenosis. Neurology follow-up maintain on aspirin for CVA prophylaxis. TEE is pending. Subcutaneous Lovenox for DVT prophylaxis. Therapy evaluations completed with recommendations of physical medicine rehabilitation consult. Patient was admitted for a rehabilitation program.   Review of Systems  Constitutional: Negative for chills and fever.  HENT: Negative for hearing loss.   Eyes: Negative for blurred vision and double vision.  Respiratory: Negative for shortness of breath.   Cardiovascular: Negative for chest pain and palpitations.  Gastrointestinal: Positive for constipation. Negative for nausea.  Genitourinary: Negative for dysuria, flank pain and hematuria.  Musculoskeletal: Positive for joint pain and myalgias.  Skin: Negative for rash.  Neurological: Positive for focal weakness.  Psychiatric/Behavioral: Positive for depression. The patient has insomnia.   All other systems  reviewed and are negative.   Past Medical History:  Diagnosis Date  . Anemia      h/o fibroids  . Bipolar disorder ([place])    . Depression      bipolar  . Eczema    . Hypothyroidism    . Insomnia    . Osteoarthritis    . Osteoporosis    . Psoriasis           Past Surgical History:  Procedure Laterality Date  . ABDOMINAL HYSTERECTOMY   1982  . APPENDECTOMY   2003  . BREAST LUMPECTOMY   1947    left  . CATARACT EXTRACTION   2010  . EYE SURGERY      . HERNIA REPAIR      . KNEE ARTHROSCOPY   09/07/11    right knee  . NASAL SEPTUM SURGERY   1970's  . TONSILLECTOMY   1970's  . TONSILLECTOMY      . TOTAL KNEE ARTHROPLASTY Right 03/04/2015    Procedure: RIGHT TOTAL KNEE ARTHROPLASTY;  Surgeon: Mcarthur Rossetti, MD;  Location: [facility];  Service: Orthopedics;  Laterality: Right;  . VENTRAL HERNIA REPAIR   2005         Family History  Problem Relation Age of Onset  . Colon cancer Brother    . Lung cancer Father    . Prostate cancer Father    . Arthritis Father    . Cancer Father          lung, prostate  . Heart disease Brother    . Cancer Brother          lung  . Lung cancer Brother    . Aortic aneurysm Brother    . Cancer Other  colon    Social History:  reports that she quit smoking about 33 years ago. She has never used smokeless tobacco. She reports that she does not drink alcohol or use drugs. Allergies:      Allergies  Allergen Reactions  . Bupropion Hcl Rash          Medications Prior to Admission  Medication Sig Dispense Refill  . ammonium lactate (LAC-HYDRIN) 12 % cream Apply topically as needed for dry skin. 385 g 0  . ARIPiprazole (ABILIFY) 5 MG tablet Take 1 tablet by mouth daily.      . calcium carbonate (OS-CAL) 600 MG TABS Take 600 mg by mouth 2 (two) times daily with a meal.        . cholecalciferol (VITAMIN D) 1000 UNITS tablet Take 3,000 Units by mouth daily.       . Cyanocobalamin (B-12) 2500 MCG TABS Take 1 tablet by mouth daily.       Marland Kitchen docusate sodium (COLACE) 100 MG capsule Take 1 capsule (100 mg total) by mouth 2 (two) times daily. (Patient taking differently: Take 100 mg by mouth daily as needed for mild constipation. ) 10 capsule 0  . Glucosamine 500 MG TABS Take 1,000 mg by mouth daily.       . hypromellose (GENTEAL) 0.3 % GEL ophthalmic ointment Place 1 application into both eyes at bedtime.      Marland Kitchen levothyroxine (SYNTHROID, LEVOTHROID) 88 MCG tablet Take 1 tablet (88 mcg total) by mouth daily. 90 tablet 1  . Lutein 40 MG CAPS Take 20 mg by mouth daily.       . Melatonin 5 MG TABS Take 15 mg by mouth at bedtime.      . multivitamin (THERAGRAN) per tablet Take 1 tablet by mouth daily.        . valACYclovir (VALTREX) 500 MG tablet Take 500 mg by mouth daily.      Marland Kitchen zolpidem (AMBIEN) 10 MG tablet Take 10 mg by mouth at bedtime.           Drug Regimen Review Drug regimen was reviewed and remains appropriate with no significant issues identified   Home: Home Living Family/patient expects to be discharged to:: Private residence Living Arrangements: Alone Available Help at Discharge: Family, Available PRN/intermittently Type of Home: House Home Access: Level entry Home Layout: One level Bathroom Shower/Tub: Multimedia programmer: Handicapped height Home Equipment: Environmental consultant - 2 wheels, Shower seat, Bedside commode   Functional History: Prior Function Level of Independence: Needs assistance Gait / Transfers Assistance Needed: Walks independently. ADL's / Homemaking Assistance Needed: Does own ADLs. Son helps with grocery shopping/drives to appts. Comments: Does water aerobics 3 times/week.    Functional Status:  Mobility: Bed Mobility Overal bed mobility: Needs Assistance Bed Mobility: Supine to Sit Supine to sit: Min assist, HOB elevated General bed mobility comments: Min A to initating trunk into upright position Transfers Overall transfer level: Needs assistance Equipment used: Rolling walker (2  wheeled) Transfers: Sit to/from Stand, [employer] Transfers Sit to Stand: Mod assist, From elevated surface Stand pivot transfers: Mod assist General transfer comment: Mod A to power up into standing and then maintain balance during pivot to recliner Ambulation/Gait Ambulation/Gait assistance: Mod assist Gait Distance (Feet): 12 Feet(x2 bouts) Assistive device: (rail in hallway) Gait Pattern/deviations: Step-to pattern, Step-through pattern, Ataxic, Narrow base of support, Decreased weight shift to right General Gait Details: Slow, unsteady gait with difficulty progressing RLE, assist to weight shift and for trunk control. Right  knee instability and buckling x3. 1 seated rest break. Gait velocity: decreased Gait velocity interpretation: <1.8 ft/sec, indicate of risk for recurrent falls   ADL: ADL Overall ADL's : Needs assistance/impaired Eating/Feeding: Set up, Sitting Grooming: Minimal assistance, Sitting, Oral care, Wash/dry face, Brushing hair, Standing, Cueing for sequencing(Applying lotion to face) Grooming Details (indicate cue type and reason): Pt performing oral care and applying lotion while seated at EOB with Min A for faciltiating normalized movement of RUE. Pt with poor grasp strength and requiring assistance to maintain grasp on tooth brush for oral care. Pt standing at sink with Max A for balance and lateral lean to right; brushed her hair and rubbing in lotion on face.  Upper Body Bathing: Minimal assistance, Sitting Lower Body Bathing: Moderate assistance, Sitting/lateral leans, Sit to/from stand Upper Body Dressing : Minimal assistance, Sitting Lower Body Dressing: Minimal assistance(sitting in recliner to apply lotion) Lower Body Dressing Details (indicate cue type and reason): While seated in recliner, Pt applying lotion to BLEs. Required assistance for distal parts of BLEs. using BUEs and crossing midline.  Toilet Transfer: Moderate assistance, Stand-pivot(simulated to  recliner) Toilet Transfer Details (indicate cue type and reason): Requiring Mod A to power up into standing and then maintain balance to pivot.  Toileting- Clothing Manipulation and Hygiene: Moderate assistance, Sitting/lateral lean, Sit to/from stand, Cueing for safety, Cueing for sequencing Functional mobility during ADLs: Moderate assistance, Cueing for safety(stand pivot only) General ADL Comments: Pt highly motivated and has decreased balance, RUE function, strength, and cognition.   Cognition: Cognition Overall Cognitive Status: History of cognitive impairments - at baseline Orientation Level: Oriented X4 Cognition Arousal/Alertness: Awake/alert Behavior During Therapy: WFL for tasks assessed/performed Overall Cognitive Status: History of cognitive impairments - at baseline Area of Impairment: Following commands, Problem solving, Awareness Following Commands: Follows one step commands with increased time Awareness: Emergent Problem Solving: Slow processing, Requires verbal cues General Comments: Some memory deficits at baseline per son. Pt requiring increased time and cues during ADLs.    Physical Exam: Blood pressure (!) 165/75, pulse 73, temperature 98.4 F (36.9 C), temperature source Oral, resp. rate 13, SpO2 96 %. Physical Exam  Constitutional: She is oriented to person, place, and time. She appears well-nourished.  HENT:  Head: Normocephalic.  Eyes: Pupils are equal, round, and reactive to light.  Neck: Normal range of motion.  Cardiovascular: Normal rate. Exam reveals no friction rub.  No murmur heard. Respiratory: Effort normal. No respiratory distress. She has no wheezes.  GI: Soft. She exhibits no distension.  Musculoskeletal: Normal range of motion.  Neurological: She is alert and oriented to person, place, and time.  Patient is alert. Setting up an edge of bed. Makes good eye contact with examiner. Follows full commands. Provides her name and age as well as date  of birth. Fair awareness of deficits. Right hemiparesis  Skin: Skin is warm.  Psychiatric: She has a normal mood and affect. Her behavior is normal.      Lab Results Last 48 Hours        Results for orders placed or performed during the hospital encounter of 11/18/18 (from the past 48 hour(s))  Protime-INR     Status: None    Collection Time: 11/18/18  2:16 PM  Result Value Ref Range    Prothrombin Time 12.1 11.4 - 15.2 seconds    INR 0.90        Comment: Performed at [facility], [place] [address], [place], [place] [address]  APTT  Status: None    Collection Time: 11/18/18  2:16 PM  Result Value Ref Range    aPTT 28 24 - 36 seconds      Comment: Performed at [facility], [place] [address] [place], [place] [address]  CBC     Status: Abnormal    Collection Time: 11/18/18  2:16 PM  Result Value Ref Range    WBC 9.4 4.0 - 10.5 K/uL    RBC 4.34 3.87 - 5.11 MIL/uL    Hemoglobin 14.4 12.0 - 15.0 g/dL    HCT 43.9 36.0 - 46.0 %    MCV 101.2 (H) 80.0 - 100.0 fL    MCH 33.2 26.0 - 34.0 pg    MCHC 32.8 30.0 - 36.0 g/dL    RDW 13.3 11.5 - 15.5 %    Platelets 254 150 - 400 K/uL    nRBC 0.0 0.0 - 0.2 %      Comment: Performed at [facility], [place] [address], [place], [place] [address]  Differential     Status: None    Collection Time: 11/18/18  2:16 PM  Result Value Ref Range    Neutrophils Relative % 70 %    Neutro Abs 6.7 1.7 - 7.7 K/uL    Lymphocytes Relative 19 %    Lymphs Abs 1.8 0.7 - 4.0 K/uL    Monocytes Relative 9 %    Monocytes Absolute 0.8 0.1 - 1.0 K/uL    Eosinophils Relative 0 %    Eosinophils Absolute 0.0 0.0 - 0.5 K/uL    Basophils Relative 1 %    Basophils Absolute 0.1 0.0 - 0.1 K/uL    Immature Granulocytes 1 %    Abs Immature Granulocytes 0.05 0.00 - 0.07 K/uL      Comment: Performed at [facility], [place] [address] [place], [place] [address]  Comprehensive metabolic panel     Status:  Abnormal    Collection Time: 11/18/18  2:16 PM  Result Value Ref Range    Sodium 143 135 - 145 mmol/L    Potassium 3.5 3.5 - 5.1 mmol/L    Chloride 109 98 - 111 mmol/L    CO2 25 22 - 32 mmol/L    Glucose, Bld 99 70 - 99 mg/dL    BUN 16 8 - 23 mg/dL    Creatinine, Ser 0.90 0.44 - 1.00 mg/dL    Calcium 9.2 8.9 - 10.3 mg/dL    Total Protein 7.7 6.5 - 8.1 g/dL    Albumin 4.4 3.5 - 5.0 g/dL    AST 26 15 - 41 U/L    ALT 20 0 - 44 U/L    Alkaline Phosphatase 25 (L) 38 - 126 U/L    Total Bilirubin 0.7 0.3 - 1.2 mg/dL    GFR calc non Af Amer 59 (L) >60 mL/min    GFR calc Af Amer >60 >60 mL/min    Anion gap 9 5 - 15      Comment: Performed at [facility], [place] [address], [place], [place] [address]  CBG monitoring, ED     Status: None    Collection Time: 11/18/18  2:54 PM  Result Value Ref Range    Glucose-Capillary 91 70 - 99 mg/dL  I-stat troponin, ED     Status: None    Collection Time: 11/18/18  3:02 PM  Result Value Ref Range    Troponin i, poc 0.00 0.00 - 0.08 ng/mL    Comment  3               Comment: Due to the release kinetics of cTnI, a negative result within the first hours of the onset of symptoms does not rule out myocardial infarction with certainty. If myocardial infarction is still suspected, repeat the test at appropriate intervals.    Hemoglobin A1c     Status: None    Collection Time: 11/19/18  5:35 AM  Result Value Ref Range    Hgb A1c MFr Bld 5.2 4.8 - 5.6 %      Comment: (NOTE) Pre diabetes:          5.7%-6.4% Diabetes:              >6.4% Glycemic control for   <7.0% adults with diabetes      Mean Plasma Glucose 102.54 mg/dL      Comment: Performed at [facility] [address], [place], [place] [address]  Lipid panel     Status: Abnormal    Collection Time: 11/19/18  5:35 AM  Result Value Ref Range    Cholesterol 191 0 - 200 mg/dL    Triglycerides 57 <150 mg/dL    HDL 54 >40 mg/dL    Total CHOL/HDL Ratio 3.5 RATIO    VLDL  11 0 - 40 mg/dL    LDL Cholesterol 126 (H) 0 - 99 mg/dL      Comment:        Total Cholesterol/HDL:CHD Risk Coronary Heart Disease Risk Table                     Men   Women  1/2 Average Risk   3.4   3.3  Average Risk       5.0   4.4  2 X Average Risk   9.6   7.1  3 X Average Risk  23.4   11.0        Use the calculated Patient Ratio above and the CHD Risk Table to determine the patient's CHD Risk.        ATP III CLASSIFICATION (LDL):  <100     mg/dL   Optimal  100-129  mg/dL   Near or Above                    Optimal  130-159  mg/dL   Borderline  160-189  mg/dL   High  >190     mg/dL   Very High Performed at [place] [address], [place], [place] [address]         Imaging Results (Last 48 hours)  Dg Chest 2 View   Result Date: 11/18/2018 CLINICAL DATA:  Pt had a stroke last night and awoke this AM with right side deficits including numbness in the right upper extremity and weakness in the right lower extremity. Pt denies chest pain, SOB, and headache. EXAM: CHEST - 2 VIEW COMPARISON:  12/09/2017 FINDINGS: Cardiac silhouette is normal in size. No mediastinal or hilar masses. No evidence of adenopathy stable aortic atherosclerosis. Lungs are hyperexpanded but clear. No pleural effusion or pneumothorax. Skeletal structures are demineralized but intact. IMPRESSION: No acute cardiopulmonary disease. Electronically Signed   By: Lajean Manes M.D.   On: 11/18/2018 19:46    Ct Head Wo Contrast   Result Date: 11/18/2018 CLINICAL DATA:  85 year old female with acute RIGHT arm and leg weakness. EXAM: CT HEAD WITHOUT CONTRAST TECHNIQUE: Contiguous axial images were obtained from the  base of the skull through the vertex without intravenous contrast. COMPARISON:  10/15/2014 CT FINDINGS: Brain: A small linear high LEFT frontal white matter hypodensity extends to the cortex and may represent an infarct of uncertain chronicity. No evidence of hemorrhage, hydrocephalus, extra-axial  collection or mass lesion/mass effect. Chronic small-vessel white matter ischemic changes are noted. Vascular: Carotid atherosclerotic calcifications identified. Skull: No acute or suspicious bony abnormalities. Sinuses/Orbits: No acute finding. Other: None IMPRESSION: 1. Small linear high LEFT frontal white matter hypodensity extending to the cortex-question infarct of uncertain chronicity. No hemorrhage. 2. Chronic small-vessel white matter ischemic changes. Electronically Signed   By: Margarette Canada M.D.   On: 11/18/2018 15:02    Mr Brain Wo Contrast   Result Date: 11/18/2018 CLINICAL DATA:  85 year old female with right extremity weakness on waking today. EXAM: MRI HEAD WITHOUT CONTRAST MRA HEAD WITHOUT CONTRAST TECHNIQUE: Multiplanar, multiecho pulse sequences of the brain and surrounding structures were obtained without intravenous contrast. Angiographic images of the head were obtained using MRA technique without contrast. COMPARISON:  Head CT without contrast earlier today. FINDINGS: MRI HEAD FINDINGS Brain: Curvilinear restricted diffusion in the posterior left corona radiata tracking to the posterior lentiform. There also is 2 more subtle areas of subcortical white matter restricted diffusion in the posterosuperior left frontal lobe (near the motor strip) and in the right parietal lobe (series 5, image 77). Mild associated T2 and FLAIR hyperintensity with no associated hemorrhage or mass effect. No contralateral right hemisphere or posterior fossa restricted diffusion. Patchy bilateral cerebral white matter T2 and FLAIR hyperintensity, which in some areas outside of the above findings also resembles (chronic) small vessel infarcts. Occasional chronic micro hemorrhages. Mild to moderate for age T2 heterogeneity in the deep gray matter nuclei. The brainstem and cerebellum are relatively spared. No midline shift, mass effect, evidence of mass lesion, ventriculomegaly, extra-axial collection or acute  intracranial hemorrhage. Cervicomedullary junction and pituitary are within normal limits. Vascular: Major intracranial vascular flow voids are preserved. Mild generalized intracranial artery tortuosity. The distal left vertebral artery appears dominant. Skull and upper cervical spine: Visualized bone marrow signal is within normal limits. Negative for age visible cervical spine. Sinuses/Orbits: Postoperative changes to both globes, otherwise negative. Other: Trace right mastoid fluid. Negative scalp and face soft tissues. MRA HEAD FINDINGS Antegrade flow in the posterior circulation with dominant distal left vertebral artery. No distal vertebral stenosis. The left PICA origin is patent. Patent basilar artery without stenosis. Normal SCA and PCA origins. Posterior communicating arteries are diminutive or absent. Bilateral PCA branches are mildly irregular. Antegrade flow in both ICA siphons. Mild siphon irregularity. No siphon stenosis. Patent carotid termini, MCA and ACA origins. Anterior communicating artery is diminutive or absent. There is a moderate to severe stenosis of the left ACA distal A2 segment (series 1030 image 6) but preserved bilateral distal ACA flow signal. The left MCA M1 segment bifurcates early. There is mild to moderate irregularity and stenosis in the proximal anterior M2 branch. Other visible left MCA branches are within normal limits. Right MCA M1, bifurcation, and visible right MCA branches are within normal limits. IMPRESSION: 1. Acute lacunar infarct of the posterior Left corona radiata and posterior lentiform, with two small superimposed small acute white matter infarcts in the posterior Left MCA territory, including near the left motor strip. No associated hemorrhage or mass effect. 2. Intracranial MRA is negative for large vessel occlusion but positive for moderate stenoses of the both the distal Left A2 and Left anterior M2 branches. 3.  Underlying moderate chronic small vessel  disease. Electronically Signed   By: Genevie Ann M.D.   On: 11/18/2018 22:10    Mr Virgel Paling QJ Contrast   Result Date: 11/18/2018 CLINICAL DATA:  85 year old female with right extremity weakness on waking today. EXAM: MRI HEAD WITHOUT CONTRAST MRA HEAD WITHOUT CONTRAST TECHNIQUE: Multiplanar, multiecho pulse sequences of the brain and surrounding structures were obtained without intravenous contrast. Angiographic images of the head were obtained using MRA technique without contrast. COMPARISON:  Head CT without contrast earlier today. FINDINGS: MRI HEAD FINDINGS Brain: Curvilinear restricted diffusion in the posterior left corona radiata tracking to the posterior lentiform. There also is 2 more subtle areas of subcortical white matter restricted diffusion in the posterosuperior left frontal lobe (near the motor strip) and in the right parietal lobe (series 5, image 77). Mild associated T2 and FLAIR hyperintensity with no associated hemorrhage or mass effect. No contralateral right hemisphere or posterior fossa restricted diffusion. Patchy bilateral cerebral white matter T2 and FLAIR hyperintensity, which in some areas outside of the above findings also resembles (chronic) small vessel infarcts. Occasional chronic micro hemorrhages. Mild to moderate for age T2 heterogeneity in the deep gray matter nuclei. The brainstem and cerebellum are relatively spared. No midline shift, mass effect, evidence of mass lesion, ventriculomegaly, extra-axial collection or acute intracranial hemorrhage. Cervicomedullary junction and pituitary are within normal limits. Vascular: Major intracranial vascular flow voids are preserved. Mild generalized intracranial artery tortuosity. The distal left vertebral artery appears dominant. Skull and upper cervical spine: Visualized bone marrow signal is within normal limits. Negative for age visible cervical spine. Sinuses/Orbits: Postoperative changes to both globes, otherwise negative. Other:  Trace right mastoid fluid. Negative scalp and face soft tissues. MRA HEAD FINDINGS Antegrade flow in the posterior circulation with dominant distal left vertebral artery. No distal vertebral stenosis. The left PICA origin is patent. Patent basilar artery without stenosis. Normal SCA and PCA origins. Posterior communicating arteries are diminutive or absent. Bilateral PCA branches are mildly irregular. Antegrade flow in both ICA siphons. Mild siphon irregularity. No siphon stenosis. Patent carotid termini, MCA and ACA origins. Anterior communicating artery is diminutive or absent. There is a moderate to severe stenosis of the left ACA distal A2 segment (series 1030 image 6) but preserved bilateral distal ACA flow signal. The left MCA M1 segment bifurcates early. There is mild to moderate irregularity and stenosis in the proximal anterior M2 branch. Other visible left MCA branches are within normal limits. Right MCA M1, bifurcation, and visible right MCA branches are within normal limits. IMPRESSION: 1. Acute lacunar infarct of the posterior Left corona radiata and posterior lentiform, with two small superimposed small acute white matter infarcts in the posterior Left MCA territory, including near the left motor strip. No associated hemorrhage or mass effect. 2. Intracranial MRA is negative for large vessel occlusion but positive for moderate stenoses of the both the distal Left A2 and Left anterior M2 branches. 3. Underlying moderate chronic small vessel disease. Electronically Signed   By: Genevie Ann M.D.   On: 11/18/2018 22:10    Vas US Carotid (at [facility] Only)   Result Date: 11/19/2018 Carotid Arterial Duplex Study Indications:       CVA and Weakness. Comparison Study:  No prior study on file Performing Technologist: Sharion Dove RVS  Examination Guidelines: A complete evaluation includes B-mode imaging, spectral Doppler, color Doppler, and power Doppler as needed of all accessible portions of each vessel.  Bilateral testing is considered an  integral part of a complete examination. Limited examinations for reoccurring indications may be performed as noted.  Right Carotid Findings: +----------+--------+--------+--------+------------+------------------+           PSV cm/sEDV cm/sStenosisDescribe    Comments           +----------+--------+--------+--------+------------+------------------+ CCA Prox  59      12                          intimal thickening +----------+--------+--------+--------+------------+------------------+ CCA Distal57      12                          intimal thickening +----------+--------+--------+--------+------------+------------------+ ICA Prox  50      16              heterogenous                   +----------+--------+--------+--------+------------+------------------+ ICA Distal42      10                                             +----------+--------+--------+--------+------------+------------------+ ECA       77      12                                             +----------+--------+--------+--------+------------+------------------+ +----------+--------+-------+--------+-------------------+           PSV cm/sEDV cmsDescribeArm Pressure (mmHG) +----------+--------+-------+--------+-------------------+ [id number]      14                                 +----------+--------+-------+--------+-------------------+ +---------+--------+--+--------+-+ VertebralPSV cm/s47EDV cm/s8 +---------+--------+--+--------+-+  Left Carotid Findings: +----------+--------+--------+--------+------------+--------+           PSV cm/sEDV cm/sStenosisDescribe    Comments +----------+--------+--------+--------+------------+--------+ CCA Prox  48      7               homogeneous          +----------+--------+--------+--------+------------+--------+ CCA Distal76      15              homogeneous           +----------+--------+--------+--------+------------+--------+ ICA Prox  55      14              heterogenous         +----------+--------+--------+--------+------------+--------+ ICA Distal47      10                                   +----------+--------+--------+--------+------------+--------+ ECA       73      10                                   +----------+--------+--------+--------+------------+--------+ +----------+--------+--------+--------+-------------------+ SubclavianPSV cm/sEDV cm/sDescribeArm Pressure (mmHG) +----------+--------+--------+--------+-------------------+           22                                          +----------+--------+--------+--------+-------------------+ +---------+--------+--+--------+--+  VertebralPSV cm/s57EDV cm/s12 +---------+--------+--+--------+--+  Summary: Right Carotid: The extracranial vessels were near-normal with only minimal wall                thickening or plaque. Left Carotid: The extracranial vessels were near-normal with only minimal wall               thickening or plaque. Vertebrals:  Bilateral vertebral arteries demonstrate antegrade flow. Subclavians: Normal flow hemodynamics were seen in bilateral subclavian              arteries. *See table(s) above for measurements and observations.     Preliminary              Medical Problem List and Plan: 1.  Right side weakness secondary to embolic left territory MCA infarction. Status post loop recorder             -admit to inpatient rehab             -ASA and plavix for 3 weeks then ASA 81mg  alone 2.  DVT Prophylaxis/Anticoagulation: subcutaneous Lovenox. Monitor for any bleeding episodes 3. Pain Management:  Tylenol as needed 4. Mood:  Abilify 5 mg daily, melatonin 15 mg daily at bedtime 5. Neuropsych: This patient is capable of making decisions on her own behalf. 6. Skin/Wound Care:  Routine skin checks 7. Fluids/Electrolytes/Nutrition:  Routine in and out's  with follow-up chemistries 8. Hypothyroidism. Synthroid 9. Hyperlipidemia. Lipitor     Post Admission Physician Evaluation: 1. Functional deficits secondary  to embolic left MCA infarct. 2. Patient is admitted to receive collaborative, interdisciplinary care between the physiatrist, rehab nursing staff, and therapy team. 3. Patient's level of medical complexity and substantial therapy needs in context of that medical necessity cannot be provided at a lesser intensity of care such as a SNF. 4. Patient has experienced substantial functional loss from his/her baseline which was documented above under the "Functional History" and "Functional Status" headings.  Judging by the patient's diagnosis, physical exam, and functional history, the patient has potential for functional progress which will result in measurable gains while on inpatient rehab.  These gains will be of substantial and practical use upon discharge  in facilitating mobility and self-care at the household level. 5. Physiatrist will provide 24 hour management of medical needs as well as oversight of the therapy plan/treatment and provide guidance as appropriate regarding the interaction of the two. 6. The Preadmission Screening has been reviewed and patient status is unchanged unless otherwise stated above. 7. 24 hour rehab nursing will assist with bladder management, bowel management, safety, skin/wound care, disease management, medication administration, pain management and patient education  and help integrate therapy concepts, techniques,education, etc. 8. PT will assess and treat for/with: Lower extremity strength, range of motion, stamina, balance, functional mobility, safety, adaptive techniques and equipment, NMR, visual-spatial awareness.   Goals are: mod I to superision. 9. OT will assess and treat for/with: ADL's, functional mobility, safety, upper extremity strength, adaptive techniques and equipment, NMR, family ed.   Goals are:  mod I to supervision. Therapy may proceed with showering this patient. 10. SLP will assess and treat for/with: cognition, speech, .  Goals are: mod I to supervision. 11. Case Management and Social Worker will assess and treat for psychological issues and discharge planning. 12. Team conference will be held weekly to assess progress toward goals and to determine barriers to discharge. 13. Patient will receive at least 3 hours of therapy per day at least 5 days per week. 14.  ELOS: 10-14 days       15. Prognosis:  excellent     I have personally performed a face to face diagnostic evaluation of this patient and formulated the key components of the plan.  Additionally, I have personally reviewed laboratory data, imaging studies, as well as relevant notes and concur with the physician assistant's documentation above.   Meredith Staggers, MD, Mellody Drown     Lavon Paganini Scotia, PA-C 11/20/2018  The patient's status has not changed. The original post admission physician evaluation remains appropriate, and any changes from the pre-admission screening or documentation from the acute chart are noted above.  Meredith Staggers, MD 11/20/2018

## 2018-11-20 NOTE — Consult Note (Addendum)
ELECTROPHYSIOLOGY CONSULT NOTE  Patient ID: [name] [name] MRN: [id number], DOB/AGE: 01-13-1933   Admit date: 11/18/2018 Date of Consult: 11/20/2018  Primary Physician: Carollee Herter, Alferd Apa, DO Primary Cardiologist: none Reason for Consultation: Cryptogenic stroke ; recommendations regarding Implantable Loop Recorder, requested by Dr. Jaynee Eagles  History of Present Illness Naseem A Kosier was admitted on 11/18/2018 with R sided weakness, stroke transferred from [facility] to [facility] for management  PMHx includes osteoporosis, hypothyroidism, bipoloar disorder They first developed/notwed symptoms upon waking.  Imaging demonstrated  acute lacunar infarct of the posterior left corona radiata and posterior lentiform, with two small superimposed small acute white matter infarcts in the posterior left MCA territory.  she has undergone workup for stroke including echocardiogram and carotid dopplers.  The patient has been monitored on telemetry which has demonstrated sinus rhythm with no arrhythmias.  Inpatient stroke work-up is to be completed with a TEE.   Echocardiogram this admission demonstrated IMPRESSIONS  1. The left ventricle has normal systolic function of 62-13%. The cavity size is normal. There is no left ventricular wall thickness. Echo evidence of impaired relaxation diastolic filling patterns. Normal left ventricular filling pressures.  2. Normal left atrial size.  3. Normal right atrial size.  4. The aortic valve is tricuspid. There is mild thickening and sclerosis without any evidence of stenosis of the aortic valve.  5. No atrial level shunt detected by color flow Doppler.   Lab work is reviewed.  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  She is recovering slowly from her stroke with plans to CIR at discharge.   Past Medical History:  Diagnosis Date  . Anemia    h/o fibroids  . Bipolar disorder ([place])   .  Depression    bipolar  . Eczema   . Hypothyroidism   . Insomnia   . Osteoarthritis   . Osteoporosis   . Psoriasis      Surgical History:  Past Surgical History:  Procedure Laterality Date  . ABDOMINAL HYSTERECTOMY  1982  . APPENDECTOMY  2003  . BREAST LUMPECTOMY  1947   left  . CATARACT EXTRACTION  2010  . EYE SURGERY    . HERNIA REPAIR    . KNEE ARTHROSCOPY  09/07/11   right knee  . NASAL SEPTUM SURGERY  1970's  . TONSILLECTOMY  1970's  . TONSILLECTOMY    . TOTAL KNEE ARTHROPLASTY Right 03/04/2015   Procedure: RIGHT TOTAL KNEE ARTHROPLASTY;  Surgeon: Mcarthur Rossetti, MD;  Location: [facility];  Service: Orthopedics;  Laterality: Right;  . VENTRAL HERNIA REPAIR  2005     Medications Prior to Admission  Medication Sig Dispense Refill Last Dose  . ammonium lactate (LAC-HYDRIN) 12 % cream Apply topically as needed for dry skin. 385 g 0 Past Week at Unknown time  . ARIPiprazole (ABILIFY) 5 MG tablet Take 1 tablet by mouth daily.   11/18/2018 at Unknown time  . calcium carbonate (OS-CAL) 600 MG TABS Take 600 mg by mouth 2 (two) times daily with a meal.     11/17/2018 at Unknown time  . cholecalciferol (VITAMIN D) 1000 UNITS tablet Take 3,000 Units by mouth daily.    11/17/2018 at Unknown time  . Cyanocobalamin (B-12) 2500 MCG TABS Take 1 tablet by mouth daily.   11/17/2018 at Unknown time  . docusate sodium (COLACE) 100 MG capsule Take 1 capsule (100 mg total) by mouth 2 (two) times daily. (  Patient taking differently: Take 100 mg by mouth daily as needed for mild constipation. ) 10 capsule 0 Past Month at Unknown time  . Glucosamine 500 MG TABS Take 1,000 mg by mouth daily.    11/17/2018 at Unknown time  . hypromellose (GENTEAL) 0.3 % GEL ophthalmic ointment Place 1 application into both eyes at bedtime.   11/17/2018 at Unknown time  . levothyroxine (SYNTHROID, LEVOTHROID) 88 MCG tablet Take 1 tablet (88 mcg total) by mouth daily. 90 tablet 1 11/18/2018 at Unknown time  . Lutein 40 MG  CAPS Take 20 mg by mouth daily.    Past Month at Unknown time  . Melatonin 5 MG TABS Take 15 mg by mouth at bedtime.   11/17/2018 at Unknown time  . multivitamin (THERAGRAN) per tablet Take 1 tablet by mouth daily.     11/17/2018 at Unknown time  . valACYclovir (VALTREX) 500 MG tablet Take 500 mg by mouth daily.   Past Week at Unknown time  . zolpidem (AMBIEN) 10 MG tablet Take 10 mg by mouth at bedtime.    11/17/2018 at Unknown time    Inpatient Medications:  .  stroke: mapping our early stages of recovery book   Does not apply Once  . ARIPiprazole  5 mg Oral Daily  . aspirin  300 mg Rectal Daily   Or  . aspirin  325 mg Oral Daily  . atorvastatin  40 mg Oral q1800  . enoxaparin (LOVENOX) injection  40 mg Subcutaneous Q24H  . levothyroxine  88 mcg Oral Daily  . Melatonin  15 mg Oral QHS    Allergies:  Allergies  Allergen Reactions  . Bupropion Hcl Rash    Social History   Socioeconomic History  . Marital status: Single    Spouse name: Not on file  . Number of children: 1  . Years of education: Not on file  . Highest education level: Not on file  Occupational History  . Occupation: retired    Fish farm manager: RETIRED  Social Needs  . Financial resource strain: Not on file  . Food insecurity:    Worry: Not on file    Inability: Not on file  . Transportation needs:    Medical: Not on file    Non-medical: Not on file  Tobacco Use  . Smoking status: Former Smoker    Last attempt to quit: 09/29/1985    Years since quitting: 33.1  . Smokeless tobacco: Never Used  Substance and Sexual Activity  . Alcohol use: No  . Drug use: No  . Sexual activity: Not Currently    Partners: Male  Lifestyle  . Physical activity:    Days per week: Not on file    Minutes per session: Not on file  . Stress: Not on file  Relationships  . Social connections:    Talks on phone: Not on file    Gets together: Not on file    Attends religious service: Not on file    Active member of club or  organization: Not on file    Attends meetings of clubs or organizations: Not on file    Relationship status: Not on file  . Intimate partner violence:    Fear of current or ex partner: Not on file    Emotionally abused: Not on file    Physically abused: Not on file    Forced sexual activity: Not on file  Other Topics Concern  . Not on file  Social History Narrative   Lives by  herself   PT for knee----silver sneakers     Family History  Problem Relation Age of Onset  . Colon cancer Brother   . Lung cancer Father   . Prostate cancer Father   . Arthritis Father   . Cancer Father        lung, prostate  . Heart disease Brother   . Cancer Brother        lung  . Lung cancer Brother   . Aortic aneurysm Brother   . Cancer Other        colon      Review of Systems: All other systems reviewed and are otherwise negative except as noted above.  Physical Exam: Vitals:   11/19/18 1955 11/19/18 2330 11/20/18 0412 11/20/18 0754  BP: (!) 188/83 (!) 172/78 131/83 (!) 165/75  Pulse: 73 80 85 73  Resp: 18 17  13   Temp:  98.1 F (36.7 C) 98 F (36.7 C) 98.4 F (36.9 C)  TempSrc:  Oral Oral Oral  SpO2: 94% 94%  96%    GEN- The patient is well appearing, alert and oriented x 3 today.   Head- normocephalic, atraumatic Eyes-  Sclera clear, conjunctiva pink Ears- hearing intact Oropharynx- clear, missing most of her teeth Neck- supple Lungs- CTA b/l normal work of breathing Heart- RRR, no murmurs, rubs or gallops  GI- soft, NT, ND Extremities- no clubbing, cyanosis, or edema MS- no significant deformity, age appropriate atrophy Skin- no rash or lesion Psych- euthymic mood, full affect   Labs:   Lab Results  Component Value Date   WBC 9.4 11/18/2018   HGB 14.4 11/18/2018   HCT 43.9 11/18/2018   MCV 101.2 (H) 11/18/2018   PLT 254 11/18/2018    Recent Labs  Lab 11/18/18 1416  NA 143  K 3.5  CL 109  CO2 25  BUN 16  CREATININE 0.90  CALCIUM 9.2  PROT 7.7  BILITOT  0.7  ALKPHOS 25*  ALT 20  AST 26  GLUCOSE 99   Lab Results  Component Value Date   CKTOTAL 65 08/04/2018   Lab Results  Component Value Date   CHOL 191 11/19/2018   CHOL 224 (A) 05/22/2018   CHOL 212 (H) 09/06/2013   Lab Results  Component Value Date   HDL 54 11/19/2018   HDL 66 05/22/2018   HDL 56.90 09/06/2013   Lab Results  Component Value Date   LDLCALC 126 (H) 11/19/2018   LDLCALC 140 05/22/2018   LDLCALC 117 (H) 09/30/2011   Lab Results  Component Value Date   TRIG 57 11/19/2018   TRIG 91 05/22/2018   TRIG 69.0 09/06/2013   Lab Results  Component Value Date   CHOLHDL 3.5 11/19/2018   CHOLHDL 4 09/06/2013   CHOLHDL 3 09/30/2011   Lab Results  Component Value Date   LDLDIRECT 150.7 09/06/2013   LDLDIRECT 123.7 09/04/2010   LDLDIRECT 134.2 12/21/2007    No results found for: DDIMER   Radiology/Studies:   Dg Chest 2 View Result Date: 11/18/2018 CLINICAL DATA:  Pt had a stroke last night and awoke this AM with right side deficits including numbness in the right upper extremity and weakness in the right lower extremity. Pt denies chest pain, SOB, and headache. EXAM: CHEST - 2 VIEW COMPARISON:  12/09/2017 FINDINGS: Cardiac silhouette is normal in size. No mediastinal or hilar masses. No evidence of adenopathy stable aortic atherosclerosis. Lungs are hyperexpanded but clear. No pleural effusion or pneumothorax. Skeletal structures are demineralized but  intact. IMPRESSION: No acute cardiopulmonary disease. Electronically Signed   By: Lajean Manes M.D.   On: 11/18/2018 19:46    Ct Head Wo Contrast Result Date: 11/18/2018 CLINICAL DATA:  85 year old female with acute RIGHT arm and leg weakness. EXAM: CT HEAD WITHOUT CONTRAST TECHNIQUE: Contiguous axial images were obtained from the base of the skull through the vertex without intravenous contrast. COMPARISON:  10/15/2014 CT FINDINGS: Brain: A small linear high LEFT frontal white matter hypodensity extends to the  cortex and may represent an infarct of uncertain chronicity. No evidence of hemorrhage, hydrocephalus, extra-axial collection or mass lesion/mass effect. Chronic small-vessel white matter ischemic changes are noted. Vascular: Carotid atherosclerotic calcifications identified. Skull: No acute or suspicious bony abnormalities. Sinuses/Orbits: No acute finding. Other: None IMPRESSION: 1. Small linear high LEFT frontal white matter hypodensity extending to the cortex-question infarct of uncertain chronicity. No hemorrhage. 2. Chronic small-vessel white matter ischemic changes. Electronically Signed   By: Margarette Canada M.D.   On: 11/18/2018 15:02    Mr Brain Wo Contrast Result Date: 11/18/2018 CLINICAL DATA:  85 year old female with right extremity weakness on waking today. EXAM: MRI HEAD WITHOUT CONTRAST MRA HEAD WITHOUT CONTRAST TECHNIQUE: Multiplanar, multiecho pulse sequences of the brain and surrounding structures were obtained without intravenous contrast. Angiographic images of the head were obtained using MRA technique without contrast. COMPARISON:  Head CT without contrast earlier today. FINDINGS: MRI HEAD FINDINGS Brain: Curvilinear restricted diffusion in the posterior left corona radiata tracking to the posterior lentiform. There also is 2 more subtle areas of subcortical white matter restricted diffusion in the posterosuperior left frontal lobe (near the motor strip) and in the right parietal lobe (series 5, image 77). Mild associated T2 and FLAIR hyperintensity with no associated hemorrhage or mass effect. No contralateral right hemisphere or posterior fossa restricted diffusion. Patchy bilateral cerebral white matter T2 and FLAIR hyperintensity, which in some areas outside of the above findings also resembles (chronic) small vessel infarcts. Occasional chronic micro hemorrhages. Mild to moderate for age T2 heterogeneity in the deep gray matter nuclei. The brainstem and cerebellum are relatively spared. No  midline shift, mass effect, evidence of mass lesion, ventriculomegaly, extra-axial collection or acute intracranial hemorrhage. Cervicomedullary junction and pituitary are within normal limits. Vascular: Major intracranial vascular flow voids are preserved. Mild generalized intracranial artery tortuosity. The distal left vertebral artery appears dominant. Skull and upper cervical spine: Visualized bone marrow signal is within normal limits. Negative for age visible cervical spine. Sinuses/Orbits: Postoperative changes to both globes, otherwise negative. Other: Trace right mastoid fluid. Negative scalp and face soft tissues. MRA HEAD FINDINGS Antegrade flow in the posterior circulation with dominant distal left vertebral artery. No distal vertebral stenosis. The left PICA origin is patent. Patent basilar artery without stenosis. Normal SCA and PCA origins. Posterior communicating arteries are diminutive or absent. Bilateral PCA branches are mildly irregular. Antegrade flow in both ICA siphons. Mild siphon irregularity. No siphon stenosis. Patent carotid termini, MCA and ACA origins. Anterior communicating artery is diminutive or absent. There is a moderate to severe stenosis of the left ACA distal A2 segment (series 1030 image 6) but preserved bilateral distal ACA flow signal. The left MCA M1 segment bifurcates early. There is mild to moderate irregularity and stenosis in the proximal anterior M2 branch. Other visible left MCA branches are within normal limits. Right MCA M1, bifurcation, and visible right MCA branches are within normal limits. IMPRESSION: 1. Acute lacunar infarct of the posterior Left corona radiata and posterior lentiform, with  two small superimposed small acute white matter infarcts in the posterior Left MCA territory, including near the left motor strip. No associated hemorrhage or mass effect. 2. Intracranial MRA is negative for large vessel occlusion but positive for moderate stenoses of the  both the distal Left A2 and Left anterior M2 branches. 3. Underlying moderate chronic small vessel disease. Electronically Signed   By: Genevie Ann M.D.   On: 11/18/2018 22:10    Vas US Carotid (at [facility] Only) Result Date: 11/19/2018 Carotid Arterial Duplex Study Indications:       CVA and Weakness. Comparison Study:  No prior study on file Performing Technologist: Sharion Dove RVS  Examination Guidelines: A complete evaluation includes B-mode imaging, spectral Doppler, color Doppler, and power Doppler as needed of all accessible portions of each vessel. Bilateral testing is considered an integral part of a complete examination. Limited examinations for reoccurring indications may be performed as noted.  Right Carotid  Summary: Right Carotid: The extracranial vessels were near-normal with only minimal wall                thickening or plaque. Left Carotid: The extracranial vessels were near-normal with only minimal wall               thickening or plaque. Vertebrals:  Bilateral vertebral arteries demonstrate antegrade flow. Subclavians: Normal flow hemodynamics were seen in bilateral subclavian              arteries. *See table(s) above for measurements and observations.     Preliminary     12-lead ECG SR All prior EKG's in EPIC reviewed with no documented atrial fibrillation  Telemetry SR  Assessment and Plan:  1. Cryptogenic stroke The patient presents with cryptogenic stroke.  The patient has a TEE planned for today.  I spoke at length with the patient and her son at bedside about monitoring for afib with either a 30 day event monitor or an implantable loop recorder.  Risks, benefits, and alteratives to implantable loop recorder were discussed with the patient today.   At this time, the patient is very clear in her decision to proceed with implantable loop recorder.   Wound care was reviewed with the patient (keep incision clean and dry for 3 days).  Wound check will be scheduled for the  patient  Please call with questions.   Renee Dyane Dustman, PA-C 11/20/2018   EP Attending  Patient seen and examined. Agree with above. The patient has had a cryptogenic stroke and workup at this point is negative. I have discussed the indications/risks/benefits/goals/expectations of ILR insertion with the patient and she wishes to proceed.   Mikle Bosworth.D.

## 2018-11-20 NOTE — Progress Notes (Signed)
STROKE TEAM PROGRESS NOTE   SUBJECTIVE Patient is lying comfortably in bed. She is nothing by mouth and awaiting TEE. She has no complaints. I have reviewed in details patient's history of present illness.she feels that her right-sided weakness is improving OBJECTIVE Vitals:   11/20/18 0754 11/20/18 1210  BP: (!) 165/75 (!) 182/84  Pulse: 73 72  Resp: 13 (!) 22  Temp: 98.4 F (36.9 C) 98.4 F (36.9 C)  SpO2: 96% 97%     Physical Exam Frail elderly Caucasian lady currently not in distress.. . Afebrile. Head is nontraumatic. Neck is supple without bruit.    Cardiac exam no murmur or gallop. Lungs are clear to auscultation. Distal pulses are well felt. Neurological Exam ;  Awake  Alert oriented x 3. Normal speech and language.eye movements full without nystagmus.fundi were not visualized. Vision acuity and fields appear normal. Hearing is normal. Palatal movements are normal. Face symmetric. Tongue midline. Normal strength, tone, reflexes and coordination except mild right hemiparesis with weakness of right grip and intrinsic hand muscles. Orbits left over right upper extremity.. Normal sensation. Gait deferred.  Pertinent Laboratory Studies (past 3 days) / Diagnostics (past 24h) Recent Labs    11/18/18 1416  WBC 9.4  HGB 14.4  PLT 254  NA 143  K 3.5  CREATININE 0.90  GLUCOSE 99    No results found. Carotid ultrasound no significant bilateral extracranial carotid stenosis. 2-D echocardiogram normal ejection fraction of 60-65%. No cardiac source of embolism. LDL cholesterol 126 mg percent Hemoglobin A1c 5.2  ASSESSMENT Ms. [name] [name] is a 85 y.o. female with  Right hemiparesis due to large left subcortical and smaller left MCA branch infarcts likely of embolic etiology.vascular risk factors of hyperlipidemia and age      RECOMMENDATIONS  I have personally obtained history,examined this patient, reviewed notes, independently viewed imaging studies, participated in  medical decision making and plan of care.ROS completed by me personally and pertinent positives fully documented  I have made any additions or clarifications directly to the above note.  Recommend dual antiplatelet therapy of aspirin and Plavix for 3 weeks followed by aspirin alone. Check transesophageal echocardiogram for cardiac source of embolism and if negative loop recorder insertion for paroxysmal A. Fib. Physical occupational therapy consults. Mobilize out of bed. Add statin for elevated lipids. No family available at the bedside for discussion. Discussed with patient and Dr. Loleta Books. Greater than 50% time during this 35 minute visit was spent on counseling and coordination of care about her cryptogenic stroke and answering questions discussion about plan of care   Hospital day # [place], [place] for Schedule & Pager information 11/20/2018 2:12 PM    To contact Stroke Continuity provider, please refer to [email or web address] After hours, contact General Neurology

## 2018-11-20 NOTE — Care Management Note (Signed)
Case Management Note  Patient Details  Name: [name] [name] MRN: [id number] Date of Birth: 11/13/1932  Subjective/Objective:                    Action/Plan: Pt discharging to CIR today. CM signing off.  Expected Discharge Date:  11/20/18               Expected Discharge Plan:  [place]  In-House Referral:     Discharge planning Services  CM Consult  Post Acute Care Choice:    Choice offered to:     DME Arranged:    DME Agency:     HH Arranged:    HH Agency:     Status of Service:  Completed, signed off  If discussed at [employer], dates discussed:    Additional Comments:  Pollie Friar, RN 11/20/2018, 4:33 PM

## 2018-11-20 NOTE — Progress Notes (Signed)
Pt arrived to unit via wc, son at bedside.

## 2018-11-21 ENCOUNTER — Inpatient Hospital Stay (HOSPITAL_COMMUNITY): Payer: Medicare Other

## 2018-11-21 ENCOUNTER — Inpatient Hospital Stay (HOSPITAL_COMMUNITY): Payer: Medicare Other | Admitting: Speech Pathology

## 2018-11-21 ENCOUNTER — Encounter (HOSPITAL_COMMUNITY): Payer: Self-pay | Admitting: Internal Medicine

## 2018-11-21 DIAGNOSIS — G8191 Hemiplegia, unspecified affecting right dominant side: Secondary | ICD-10-CM

## 2018-11-21 LAB — CBC WITH DIFFERENTIAL/PLATELET
Abs Immature Granulocytes: 0.02 10*3/uL (ref 0.00–0.07)
Basophils Absolute: 0.1 10*3/uL (ref 0.0–0.1)
Basophils Relative: 1 %
Eosinophils Absolute: 0.2 10*3/uL (ref 0.0–0.5)
Eosinophils Relative: 2 %
HCT: 40.9 % (ref 36.0–46.0)
Hemoglobin: 13.3 g/dL (ref 12.0–15.0)
Immature Granulocytes: 0 %
Lymphocytes Relative: 34 %
Lymphs Abs: 2.3 10*3/uL (ref 0.7–4.0)
MCH: 31.7 pg (ref 26.0–34.0)
MCHC: 32.5 g/dL (ref 30.0–36.0)
MCV: 97.4 fL (ref 80.0–100.0)
Monocytes Absolute: 0.7 10*3/uL (ref 0.1–1.0)
Monocytes Relative: 11 %
Neutro Abs: 3.6 10*3/uL (ref 1.7–7.7)
Neutrophils Relative %: 52 %
Platelets: 215 10*3/uL (ref 150–400)
RBC: 4.2 MIL/uL (ref 3.87–5.11)
RDW: 13.3 % (ref 11.5–15.5)
WBC: 6.8 10*3/uL (ref 4.0–10.5)
nRBC: 0 % (ref 0.0–0.2)

## 2018-11-21 LAB — COMPREHENSIVE METABOLIC PANEL
ALT: 18 U/L (ref 0–44)
AST: 24 U/L (ref 15–41)
Albumin: 3.5 g/dL (ref 3.5–5.0)
Alkaline Phosphatase: 21 U/L — ABNORMAL LOW (ref 38–126)
Anion gap: 11 (ref 5–15)
BUN: 17 mg/dL (ref 8–23)
CO2: 22 mmol/L (ref 22–32)
Calcium: 8.9 mg/dL (ref 8.9–10.3)
Chloride: 108 mmol/L (ref 98–111)
Creatinine, Ser: 0.96 mg/dL (ref 0.44–1.00)
GFR calc Af Amer: >60 mL/min (ref >60)
GFR calc non Af Amer: 55 mL/min — ABNORMAL LOW (ref >60)
Glucose, Bld: 99 mg/dL (ref 70–99)
Potassium: 3.3 mmol/L — ABNORMAL LOW (ref 3.5–5.1)
Sodium: 141 mmol/L (ref 135–145)
Total Bilirubin: 0.6 mg/dL (ref 0.3–1.2)
Total Protein: 6.5 g/dL (ref 6.5–8.1)

## 2018-11-21 MED ORDER — POTASSIUM CHLORIDE CRYS ER 20 MEQ PO TBCR
20.0000 meq | EXTENDED_RELEASE_TABLET | Freq: Two times a day (BID) | ORAL | Status: AC
  Administered 2018-11-21 – 2018-11-22 (×4): 20 meq via ORAL
  Filled 2018-11-21 (×4): qty 1

## 2018-11-21 MED ORDER — ZOLPIDEM TARTRATE 5 MG PO TABS
5.0000 mg | ORAL_TABLET | Freq: Every evening | ORAL | Status: DC | PRN
  Administered 2018-11-21 – 2018-12-05 (×15): 5 mg via ORAL
  Filled 2018-11-21 (×15): qty 1

## 2018-11-21 NOTE — Discharge Instructions (Signed)
Inpatient Rehab Discharge Instructions  [place] Discharge date and time: No discharge date for patient encounter.   Activities/Precautions/ Functional Status: Activity: activity as tolerated Diet: regular diet Wound Care: none needed Functional status:  ___ No restrictions     ___ Walk up steps independently ___ 24/7 supervision/assistance   ___ Walk up steps with assistance ___ Intermittent supervision/assistance  ___ Bathe/dress independently ___ Walk with walker     _x__ Bathe/dress with assistance ___ Walk Independently    ___ Shower independently ___ Walk with assistance    ___ Shower with assistance ___ No alcohol     ___ Return to work/school ________  Special Instructions: No driving  Aspirin and Plavix 3 weeks then aspirin alone   STROKE/TIA DISCHARGE INSTRUCTIONS SMOKING Cigarette smoking nearly doubles your risk of having a stroke & is the single most alterable risk factor  If you smoke or have smoked in the last 12 months, you are advised to quit smoking for your health.  Most of the excess cardiovascular risk related to smoking disappears within a year of stopping.  Ask you doctor about anti-smoking medications  [place] Quit Line: 1-800-QUIT NOW  Free Smoking Cessation Classes [phone]  CHOLESTEROL Know your levels; limit fat & cholesterol in your diet  Lipid Panel     Component Value Date/Time   CHOL 191 11/19/2018 0535   TRIG 57 11/19/2018 0535   TRIG 80 09/06/2006 1101   HDL 54 11/19/2018 0535   CHOLHDL 3.5 11/19/2018 0535   VLDL 11 11/19/2018 0535   LDLCALC 126 (H) 11/19/2018 0535      Many patients benefit from treatment even if their cholesterol is at goal.  Goal: Total Cholesterol (CHOL) less than 160  Goal:  Triglycerides (TRIG) less than 150  Goal:  HDL greater than 40  Goal:  LDL (LDLCALC) less than 100   BLOOD PRESSURE American Stroke Association blood pressure target is less that 120/80 mm/Hg  Your discharge blood pressure  is:  BP: (!) 160/86  Monitor your blood pressure  Limit your salt and alcohol intake  Many individuals will require more than one medication for high blood pressure  DIABETES (A1c is a blood sugar average for last 3 months) Goal HGBA1c is under 7% (HBGA1c is blood sugar average for last 3 months)  Diabetes: No known diagnosis of diabetes    Lab Results  Component Value Date   HGBA1C 5.2 11/19/2018     Your HGBA1c can be lowered with medications, healthy diet, and exercise.  Check your blood sugar as directed by your physician  Call your physician if you experience unexplained or low blood sugars.  PHYSICAL ACTIVITY/REHABILITATION Goal is 30 minutes at least 4 days per week  Activity: Increase activity slowly, Therapies: Physical Therapy: Home Health Return to work:   Activity decreases your risk of heart attack and stroke and makes your heart stronger.  It helps control your weight and blood pressure; helps you relax and can improve your mood.  Participate in a regular exercise program.  Talk with your doctor about the best form of exercise for you (dancing, walking, swimming, cycling).  DIET/WEIGHT Goal is to maintain a healthy weight  Your discharge diet is:  Diet Order            Diet regular Room service appropriate? Yes; Fluid consistency: Thin  Diet effective now              liquids Your height is:  Height: 5\' 5"  (165.1  cm) Your current weight is: Weight: 68.3 kg Your Body Mass Index (BMI) is:  BMI (Calculated): 25.06  Following the type of diet specifically designed for you will help prevent another stroke.  Your goal weight range is:    Your goal Body Mass Index (BMI) is 19-24.  Healthy food habits can help reduce 3 risk factors for stroke:  High cholesterol, hypertension, and excess weight.  RESOURCES Stroke/Support Group:  Call [phone]   STROKE EDUCATION PROVIDED/REVIEWED AND GIVEN TO PATIENT Stroke warning signs and symptoms How to activate  emergency medical system (call 911). Medications prescribed at discharge. Need for follow-up after discharge. Personal risk factors for stroke. Pneumonia vaccine given:  Flu vaccine given:  My questions have been answered, the writing is legible, and I understand these instructions.  I will adhere to these goals & educational materials that have been provided to me after my discharge from the hospital.      My questions have been answered and I understand these instructions. I will adhere to these goals and the provided educational materials after my discharge from the hospital.  Patient/Caregiver Signature _______________________________ Date __________  Clinician Signature _______________________________________ Date __________  Please bring this form and your medication list with you to all your follow-up doctor's appointments.

## 2018-11-21 NOTE — Evaluation (Signed)
Occupational Therapy Assessment and Plan  Patient Details  Name: LOIS SLAGEL MRN: [id number] Date of Birth: [birth year]  OT Diagnosis: hemiplegia affecting dominant side Rehab Potential: Rehab Potential (ACUTE ONLY): Good ELOS: 14-18 days   Today's Date: 11/21/2018 OT Individual Time: 1300-1400 OT Individual Time Calculation (min): 60 min     Problem List:  Patient Active Problem List   Diagnosis Date Noted  . Left middle cerebral artery stroke ([place]) 11/20/2018  . Acute arterial ischemic stroke, multifocal, posterior circulation, left ([place]) 11/19/2018  . Stroke ([place]) 11/18/2018  . Stroke (cerebrum) ([place]) 11/18/2018  . Myalgia 08/06/2018  . Left lower quadrant pain 08/29/2017  . Dyspepsia 03/25/2017  . Atherosclerosis of aorta ([place]) 09/27/2016  . COPD with acute bronchitis ([place]) 09/27/2016  . Acute blood loss anemia 03/16/2015  . Osteoarthritis of right knee 03/04/2015  . Status post total right knee replacement 03/04/2015  . Contusion of knee, right 10/23/2014  . IBS 02/23/2010  . HEMORRHOIDS, EXTERNAL 01/01/2010  . HEMATURIA UNSPECIFIED 11/04/2009  . NAUSEA 06/02/2009  . URINARY INCONTINENCE, STRESS, FEMALE 05/09/2009  . Acute sinusitis 11/25/2008  . UTI 04/23/2008  . COLONIC POLYPS 12/21/2007  . Bipolar disorder ([place]) 12/21/2007  . DEVIATED NASAL SEPTUM 12/21/2007  . HERNIA, VENTRAL 12/21/2007  . Osteoarthritis 12/21/2007  . Hypothyroidism 09/01/2007  . ECZEMA 09/01/2007  . Psoriasis 09/01/2007  . Osteoporosis 09/01/2007  . Insomnia 09/01/2007    Past Medical History:  Past Medical History:  Diagnosis Date  . Anemia    h/o fibroids  . Bipolar disorder ([place])   . Depression    bipolar  . Eczema   . Hypothyroidism   . Insomnia   . Osteoarthritis   . Osteoporosis   . Psoriasis    Past Surgical History:  Past Surgical History:  Procedure Laterality Date  . ABDOMINAL HYSTERECTOMY  1982  . APPENDECTOMY  2003  . BREAST LUMPECTOMY  1947   left  .  CATARACT EXTRACTION  2010  . EYE SURGERY    . HERNIA REPAIR    . KNEE ARTHROSCOPY  09/07/11   right knee  . LOOP RECORDER INSERTION N/A 11/20/2018   Procedure: LOOP RECORDER INSERTION;  Surgeon: Evans Lance, MD;  Location: [facility];  Service: Cardiovascular;  Laterality: N/A;  . NASAL SEPTUM SURGERY  1970's  . TONSILLECTOMY  1970's  . TONSILLECTOMY    . TOTAL KNEE ARTHROPLASTY Right 03/04/2015   Procedure: RIGHT TOTAL KNEE ARTHROPLASTY;  Surgeon: Mcarthur Rossetti, MD;  Location: [facility];  Service: Orthopedics;  Laterality: Right;  . VENTRAL HERNIA REPAIR  2005    Assessment & Plan Clinical Impression: Geralynn Capri Miedema is an 85 year old right-handed female history of hypothyroidism. Per chart review she lives alone. One level home with level entry. Independent and active prior to admission. She has a son who is retired and offers good support.Presented 11/18/2018 with right-sided weakness following to the floor. Cranial CT scan showed a small linear right left frontal white matter hypodensity extending to the cortex. No hemorrhage. Patient did not receive TPA. MRI/MRA showed acute lacunar infarction in the posterior left corona radiata and posterior lentiform with 2 small superimposed small acute white matter infarctions in the posterior left MCA territory. No large vessel occlusion but positive for moderate stenosis of both the distal left A2 and left anterior M2 branches. Echocardiogram with ejection fraction of 65%. Systolic function was normal. Carotid Dopplers with no ICA stenosis. Neurology follow-up maintain on aspirin for CVA prophylaxis. TEE is  pending. Subcutaneous Lovenox for DVT prophylaxis. Therapy evaluations completed with recommendations of physical medicine rehabilitation consult. Patient was admitted for a rehabilitation program. Patient transferred to CIR on 11/20/2018 .    Patient currently requires mod with basic self-care skills secondary to muscle weakness,  decreased cardiorespiratoy endurance, impaired timing and sequencing, ataxia and decreased coordination, decreased attention to right and decreased standing balance.  Prior to hospitalization, patient could complete ADLs with modified independent .  Patient will benefit from skilled intervention to decrease level of assist with basic self-care skills and increase level of independence with iADL prior to discharge home with care partner.  Anticipate patient will require intermittent supervision and follow up home health.  OT - End of Session Activity Tolerance: Tolerates 10 - 20 min activity with multiple rests Endurance Deficit: Yes Endurance Deficit Description: generalized weakness OT Assessment Rehab Potential (ACUTE ONLY): Good OT Patient demonstrates impairments in the following area(s): Balance;Cognition;Perception;Safety;Endurance;Sensory;Motor;Skin Integrity OT Basic ADL's Functional Problem(s): Eating;Grooming;Bathing;Toileting;Dressing OT Transfers Functional Problem(s): Toilet;Tub/Shower OT Additional Impairment(s): Fuctional Use of Upper Extremity OT Plan OT Intensity: Minimum of 1-2 x/day, 45 to 90 minutes OT Frequency: 5 out of 7 days OT Duration/Estimated Length of Stay: 14-18 days OT Treatment/Interventions: Balance/vestibular training;Community reintegration;Disease mangement/prevention;Neuromuscular re-education;Patient/family education;Self Care/advanced ADL retraining;Therapeutic Exercise;UE/LE Coordination activities;Wheelchair propulsion/positioning;Visual/perceptual remediation/compensation;Therapeutic Activities;Skin care/wound managment;Psychosocial support;Pain management;Functional mobility training;Discharge planning;Cognitive remediation/compensation;DME/adaptive equipment instruction;UE/LE Strength taining/ROM OT Self Feeding Anticipated Outcome(s): mod I OT Basic Self-Care Anticipated Outcome(s): (S) OT Toileting Anticipated Outcome(s): (S) OT Bathroom Transfers  Anticipated Outcome(s): (S) OT Recommendation Recommendations for Other Services: Therapeutic Recreation consult Therapeutic Recreation Interventions: Outing/community reintergration Patient destination: Home Follow Up Recommendations: Home health OT Equipment Recommended: To be determined   Skilled Therapeutic Intervention Skilled OT evaluation completed. Edu provided to pt and son present initially on OT POC, rehab expectations, condition insight, and ELOS. Pt with no reports of pain throughout session. Pt completed shower as described below. Mod-max A for transfers throughout session as pt's fatigue progressed. Pt completed box and block test with the following results: L-31 and R: 12. Pass off to OTA at end of session.   OT Evaluation Precautions/Restrictions  Precautions Precautions: Fall Precaution Comments: R hemi, R knee buckles Restrictions Weight Bearing Restrictions: No General Chart Reviewed: Yes Family/Caregiver Present: Yes  Pain Pain Assessment Pain Scale: 0-10 Pain Score: 0-No pain Home Living/Prior Functioning Home Living Available Help at Discharge: Family, Available 24 hours/day Type of Home: House Home Access: Level entry Home Layout: One level Bathroom Shower/Tub: Multimedia programmer: Handicapped height Bathroom Accessibility: Yes  Lives With: Alone IADL History Homemaking Responsibilities: Yes Meal Prep Responsibility: Secondary Laundry Responsibility: Secondary Cleaning Responsibility: Secondary Bill Paying/Finance Responsibility: Secondary Shopping Responsibility: No Child Care Responsibility: No Current License: No Occupation: Retired Prior Function Level of Independence: Independent with gait, Independent with transfers, Independent with basic ADLs Driving: No(son took away license) Vocation: Retired Leisure: Hobbies-yes (Comment) Comments: Does water aerobics 3 times/week. enjoys playing cards ADL ADL Eating: Minimal  assistance Grooming: Minimal assistance Where Assessed-Grooming: Wheelchair Upper Body Bathing: Minimal cueing, Minimal assistance Where Assessed-Upper Body Bathing: Shower Lower Body Bathing: Moderate assistance, Minimal cueing Where Assessed-Lower Body Bathing: Shower Where Assessed-Upper Body Dressing: Wheelchair Lower Body Dressing: Maximal assistance, Minimal cueing Where Assessed-Lower Body Dressing: Wheelchair Toileting: Moderate assistance Where Assessed-Toileting: Glass blower/designer: Moderate assistance, Moderate verbal cueing Toilet Transfer Method: Stand pivot Toilet Transfer Equipment: Energy manager: Moderate assistance, Minimal cueing Social research officer, government Method: Radiographer, therapeutic: Shower seat with back,  Grab bars Vision Baseline Vision/History: Cataracts;Wears glasses(cataract sx 3 years ago) Wears Glasses: Reading only Patient Visual Report: No change from baseline Vision Assessment?: No apparent visual deficits Perception  Perception: Impaired Inattention/Neglect: Impaired-to be further tested in functional context(slight R inattention) Praxis Praxis: Intact Cognition Overall Cognitive Status: History of cognitive impairments - at baseline Arousal/Alertness: Awake/alert Orientation Level: Person;Place;Situation Person: Oriented Place: Oriented Situation: Oriented Year: 2020 Month: February Day of Week: Correct Memory: Appears intact Immediate Memory Recall: Sock;Bed;Blue Memory Recall: Sock;Blue;Bed Memory Recall Sock: With Cue Memory Recall Blue: Without Cue Memory Recall Bed: Without Cue Attention: Sustained Sustained Attention: Appears intact Awareness: Appears intact Awareness Impairment: Anticipatory impairment Problem Solving: Impaired Problem Solving Impairment: Verbal complex;Functional complex Executive Function: Self Correcting;Sequencing;Reasoning Reasoning: Appears intact Sequencing: Appears  intact Self Correcting: Appears intact Safety/Judgment: Impaired Sensation Sensation Light Touch: Impaired Detail Central sensation comments: numbness to R LE/UE Proprioception: Impaired by gross assessment Coordination Gross Motor Movements are Fluid and Coordinated: No Fine Motor Movements are Fluid and Coordinated: No Coordination and Movement Description: coordination impaired secondary to R hemiparesis  Finger Nose Finger Test: dysmetria Heel Shin Test: impaired R LE 9 Hole Peg Test: L: 27.02 seconds, R: unable to complete Motor  Motor Motor: Hemiplegia Motor - Skilled Clinical Observations: R hemiparesis Mobility  Bed Mobility Bed Mobility: Rolling Right;Rolling Left;Supine to Sit;Sit to Supine Rolling Right: Supervision/verbal cueing Rolling Left: Supervision/Verbal cueing Supine to Sit: Minimal Assistance - Patient > 75% Sit to Supine: Minimal Assistance - Patient > 75% Transfers Sit to Stand: Moderate Assistance - Patient 50-74% Stand to Sit: Moderate Assistance - Patient 50-74%  Trunk/Postural Assessment  Cervical Assessment Cervical Assessment: Exceptions to WFL(forward head) Thoracic Assessment Thoracic Assessment: Exceptions to WFL(kyphotic) Lumbar Assessment Lumbar Assessment: Exceptions to WFL(posterior pelvic tilt) Postural Control Postural Control: Deficits on evaluation Trunk Control: L lateral lean Protective Responses: impaired  Balance Balance Balance Assessed: Yes Static Sitting Balance Static Sitting - Balance Support: Feet supported Static Sitting - Level of Assistance: 5: Stand by assistance Dynamic Sitting Balance Dynamic Sitting - Balance Support: Feet supported;During functional activity Dynamic Sitting - Level of Assistance: 4: Min assist Dynamic Sitting - Balance Activities: Reaching across midline Static Standing Balance Static Standing - Balance Support: During functional activity;Bilateral upper extremity supported Static Standing -  Level of Assistance: 3: Mod assist Dynamic Standing Balance Dynamic Standing - Balance Support: During functional activity;Bilateral upper extremity supported Dynamic Standing - Level of Assistance: 3: Mod assist Dynamic Standing - Balance Activities: Reaching across midline Extremity/Trunk Assessment RUE Assessment RUE Assessment: Exceptions to [facility] Active Range of Motion (AROM) Comments: WFL General Strength Comments: Coordination deficits  LUE Assessment LUE Assessment: Within Functional Limits     Refer to Care Plan for Long Term Goals  Recommendations for other services: Therapeutic Recreation  Outing/community reintegration   Discharge Criteria: Patient will be discharged from OT if patient refuses treatment 3 consecutive times without medical reason, if treatment goals not met, if there is a change in medical status, if patient makes no progress towards goals or if patient is discharged from hospital.  The above assessment, treatment plan, treatment alternatives and goals were discussed and mutually agreed upon: by patient and by family  Curtis Sites 11/21/2018, 5:06 PM

## 2018-11-21 NOTE — Plan of Care (Signed)
  Problem: Consults Goal: RH STROKE PATIENT EDUCATION Description See Patient Education module for education specifics  Outcome: Progressing   Problem: RH BLADDER ELIMINATION Goal: RH STG MANAGE BLADDER WITH ASSISTANCE Description STG Manage Bladder With Min Assistance  Outcome: Progressing   Problem: RH SKIN INTEGRITY Goal: RH STG MAINTAIN SKIN INTEGRITY WITH ASSISTANCE Description STG Maintain Skin Integrity With [place].  Outcome: Progressing   Problem: RH SAFETY Goal: RH STG ADHERE TO SAFETY PRECAUTIONS W/ASSISTANCE/DEVICE Description STG Adhere to Safety Precautions With Min Assistance/Device.  Outcome: Progressing
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
## 2018-11-21 NOTE — Evaluation (Signed)
Speech Language Pathology Assessment and Plan  Patient Details  Name: [name] [name] MRN: [id number] Date of Birth: 1933-10-09  SLP Diagnosis: Cognitive Impairments  Rehab Potential: Good ELOS: 1-2 sessions    Today's Date: 11/21/2018 SLP Individual Time: 0805-0900 SLP Individual Time Calculation (min): 55 min   Problem List:  Patient Active Problem List   Diagnosis Date Noted  . Left middle cerebral artery stroke ([place]) 11/20/2018  . Acute arterial ischemic stroke, multifocal, posterior circulation, left ([place]) 11/19/2018  . Stroke ([place]) 11/18/2018  . Stroke (cerebrum) ([place]) 11/18/2018  . Myalgia 08/06/2018  . Left lower quadrant pain 08/29/2017  . Dyspepsia 03/25/2017  . Atherosclerosis of aorta ([place]) 09/27/2016  . COPD with acute bronchitis ([place]) 09/27/2016  . Acute blood loss anemia 03/16/2015  . Osteoarthritis of right knee 03/04/2015  . Status post total right knee replacement 03/04/2015  . Contusion of knee, right 10/23/2014  . IBS 02/23/2010  . HEMORRHOIDS, EXTERNAL 01/01/2010  . HEMATURIA UNSPECIFIED 11/04/2009  . NAUSEA 06/02/2009  . URINARY INCONTINENCE, STRESS, FEMALE 05/09/2009  . Acute sinusitis 11/25/2008  . UTI 04/23/2008  . COLONIC POLYPS 12/21/2007  . Bipolar disorder ([place]) 12/21/2007  . DEVIATED NASAL SEPTUM 12/21/2007  . HERNIA, VENTRAL 12/21/2007  . Osteoarthritis 12/21/2007  . Hypothyroidism 09/01/2007  . ECZEMA 09/01/2007  . Psoriasis 09/01/2007  . Osteoporosis 09/01/2007  . Insomnia 09/01/2007   Past Medical History:  Past Medical History:  Diagnosis Date  . Anemia    h/o fibroids  . Bipolar disorder ([place])   . Depression    bipolar  . Eczema   . Hypothyroidism   . Insomnia   . Osteoarthritis   . Osteoporosis   . Psoriasis    Past Surgical History:  Past Surgical History:  Procedure Laterality Date  . ABDOMINAL HYSTERECTOMY  1982  . APPENDECTOMY  2003  . BREAST LUMPECTOMY  1947   left  . CATARACT EXTRACTION  2010  . EYE  SURGERY    . HERNIA REPAIR    . KNEE ARTHROSCOPY  09/07/11   right knee  . LOOP RECORDER INSERTION N/A 11/20/2018   Procedure: LOOP RECORDER INSERTION;  Surgeon: Evans Lance, MD;  Location: [facility];  Service: Cardiovascular;  Laterality: N/A;  . NASAL SEPTUM SURGERY  1970's  . TONSILLECTOMY  1970's  . TONSILLECTOMY    . TOTAL KNEE ARTHROPLASTY Right 03/04/2015   Procedure: RIGHT TOTAL KNEE ARTHROPLASTY;  Surgeon: Mcarthur Rossetti, MD;  Location: [facility];  Service: Orthopedics;  Laterality: Right;  . VENTRAL HERNIA REPAIR  2005    Assessment / Plan / Recommendation Clinical Impression   [name] [name] is an 85 year old right-handed female history of hypothyroidism. Per chart review she lives alone. One level home with level entry. Independent and active prior to admission. She has a son who is retired and offers good support.Presented 11/18/2018 with right-sided weakness following to the floor. Cranial CT scan showed a small linear right left frontal white matter hypodensity extending to the cortex. No hemorrhage. Patient did not receive TPA. MRI/MRA showed acute lacunar infarction in the posterior left corona radiata and posterior lentiform with 2 small superimposed small acute white matter infarctions in the posterior left MCA territory. No large vessel occlusion but positive for moderate stenosis of both the distal left A2 and left anterior M2 branches. Echocardiogram with ejection fraction of 65%. Systolic function was normal. Carotid Dopplers with no ICA stenosis. Neurology follow-up maintain on aspirin for CVA prophylaxis. TEE is pending. Subcutaneous  Lovenox for DVT prophylaxis. Pt was admitted to CIR 11/20/18 and SLP evaluation was completed 11/21/18 with results as follows:  Pt was pleasant and engaged with SLP during cogitnive-linguistic evaluation today. Pt was also screened for dysphagia, however she denied any difficulty consuming meals or any overt s/s that would be  concerning for aspiration, therefore will defer formal swallow evaluation at this time. Pt acknowledged some baseline higher level cognitive deficits, however reported being largely independent with ADLs and has adequate family assistance for finances and driving as needed. During complex problem solving subtests from the Assessment of Language-Related Functional Activities (ALFA), pt required intermittent min assist verbal cues to complete tasks. Although suspect pt is close to baseline level of cognitive functioning, given that patient lives alone and was independent with many ADLs prior to admission, pt would benefit from 1-2 sessions of skilled ST to target complex functional problem solving to promote greatest safety and functional independence upon discharge.   Skilled Therapeutic Interventions          Cognitive linguistic evaluation was completed and results were review with pt.  SLP Assessment  Patient will need skilled [facility] during CIR admission    Recommendations  Oral Care Recommendations: Oral care BID Patient destination: Home Follow up Recommendations: None Equipment Recommended: None recommended by SLP    SLP Frequency 1 to 3 out of 7 days   SLP Duration  SLP Intensity  SLP Treatment/Interventions 1-2 sessions  Minumum of 1-2 x/day, 30 to 90 minutes  Cognitive remediation/compensation;Internal/external aids;Cueing hierarchy;Functional tasks;Patient/family education    Pain Pain Assessment Pain Scale: 0-10 Pain Score: 0-No pain  Prior Functioning Cognitive/Linguistic Baseline: Within functional limits Type of Home: Independent living facility  Lives With: Alone Available Help at Discharge: Family;Available 24 hours/day Vocation: Retired  Industrial/product designer Term Goals: Week 1: SLP Short Term Goal 1 (Week 1): STG = LTG due to short length of stay  Refer to Care Plan for Long Term Goals  Recommendations for other services: None   Discharge  Criteria: Patient will be discharged from SLP if patient refuses treatment 3 consecutive times without medical reason, if treatment goals not met, if there is a change in medical status, if patient makes no progress towards goals or if patient is discharged from hospital.  The above assessment, treatment plan, treatment alternatives and goals were discussed and mutually agreed upon: by patient  Jettie Booze, Student SLP   Jettie Booze 11/21/2018, 12:54 PM

## 2018-11-21 NOTE — Progress Notes (Addendum)
Occupational Therapy Session Note  Patient Details  Name: [name] [name] MRN: [id number] Date of Birth: April 29, 1933  Today's Date: 11/22/2018 OT Individual Time: 1400-1430 OT Individual Time Calculation (min): 30 min    Short Term Goals: Week 1:  OT Short Term Goal 1 (Week 1): Pt will complete grooming task bimanually with CGA to demonstrate improved R UE coordination OT Short Term Goal 2 (Week 1): Pt will transfer to toilet with min A OT Short Term Goal 3 (Week 1): Pt will don pants with mod A OT Short Term Goal 4 (Week 1): Pt will feed self 1/2 of meal with R UE with 75% accuracy   Skilled Therapeutic Interventions/Progress Updates:    OT intervention initiated after discussion with OTR regarding POC and treatment plan.  Pt engaged in [employer] activity while seated in w/c.  Pt able to flex/extend RUE to reach lights but unable to apply enough force or control movement to push buttons.  Pt used LUE for task with equal reaction time L vs R. Discussed pt's grasp which is strong but pt reports difficulty holding utensils for self feeding.  Red foam provided and demonstrated.  Pt reports she has more control with utensil and red foam.  NT Alysha and RN Santiago Glad notified.  Pt returned to room and remained in w/c with all needs within reach and belt alarm activated.   Therapy Documentation Precautions:  Precautions Precautions: Fall Precaution Comments: R hemi, R knee buckles Restrictions Weight Bearing Restrictions: No Pain:     Therapy/Group: Individual Therapy  Leroy Libman 11/22/2018, 6:58 AM

## 2018-11-21 NOTE — Progress Notes (Signed)
Inpatient Rehabilitation  Patient information reviewed and entered into eRehab system by [facility]. Loni Beckwith., CCC/SLP, PPS Coordinator.  Information including medical coding, functional ability and quality indicators will be reviewed and updated through discharge.    Per nursing patient was given "Data Collection Information Summary" for Patients in Inpatient Rehabilitation Facilities with attached "Privacy Act [place] Records" upon admission, which was provided in the patient education notebook.

## 2018-11-21 NOTE — Consult Note (Signed)
            [facility] CM Primary Care Navigator  11/21/2018  [name] [name] 08/17/1933 [id number]   Attemptto see patient at the bedside to identify possible discharge needs but she [facility] 4W [facility]  Per MD note, patient presented with right-sided weakness. (acute embolic stroke, HTN)   Primary care provider's office is listed as providing transition of care (TOC) follow-up.  Patient has discharge instruction to follow-up with primary care provider in 1- 2 weeks after discharge from rehab, cardiology follow-up on 11/30/18 and neurology follow-up in 4 weeks.   For additional questions please contact:  Edwena Felty A. Jhett Fretwell, BSN, RN-BC [facility] PRIMARY CARE Navigator Cell: [phone]

## 2018-11-21 NOTE — Progress Notes (Signed)
Patient noted with full code status in electronic system. Per pt report and previous records, pt is a DNR. Discussed with Judeth Porch, pt's code status to be followed up with oncoming shift.

## 2018-11-21 NOTE — Progress Notes (Signed)
PHYSICAL MEDICINE & REHABILITATION PROGRESS NOTE   Subjective/Complaints:  "I want ambien" Pt gives hx of chronic insomnia, on that medicine for 77yrs, 10mg  qhs  ROS- denies CP, SOB, N/V/D  Objective:   Vas US Carotid (at [place] Only)  Result Date: 11/20/2018 Carotid Arterial Duplex Study Indications:       CVA and Weakness. Comparison Study:  No prior study on file Performing Technologist: Sharion Dove RVS  Examination Guidelines: A complete evaluation includes B-mode imaging, spectral Doppler, color Doppler, and power Doppler as needed of all accessible portions of each vessel. Bilateral testing is considered an integral part of a complete examination. Limited examinations for reoccurring indications may be performed as noted.  Right Carotid Findings: +----------+--------+--------+--------+------------+------------------+           PSV cm/sEDV cm/sStenosisDescribe    Comments           +----------+--------+--------+--------+------------+------------------+ CCA Prox  59      12                          intimal thickening +----------+--------+--------+--------+------------+------------------+ CCA Distal57      12                          intimal thickening +----------+--------+--------+--------+------------+------------------+ ICA Prox  50      16              heterogenous                   +----------+--------+--------+--------+------------+------------------+ ICA Distal42      10                                             +----------+--------+--------+--------+------------+------------------+ ECA       77      12                                             +----------+--------+--------+--------+------------+------------------+ +----------+--------+-------+--------+-------------------+           PSV cm/sEDV cmsDescribeArm Pressure (mmHG) +----------+--------+-------+--------+-------------------+ [id number]      14                                  +----------+--------+-------+--------+-------------------+ +---------+--------+--+--------+-+ VertebralPSV cm/s47EDV cm/s8 +---------+--------+--+--------+-+  Left Carotid Findings: +----------+--------+--------+--------+------------+--------+           PSV cm/sEDV cm/sStenosisDescribe    Comments +----------+--------+--------+--------+------------+--------+ CCA Prox  48      7               homogeneous          +----------+--------+--------+--------+------------+--------+ CCA Distal76      15              homogeneous          +----------+--------+--------+--------+------------+--------+ ICA Prox  55      14              heterogenous         +----------+--------+--------+--------+------------+--------+ ICA Distal47      10                                   +----------+--------+--------+--------+------------+--------+  ECA       73      10                                   +----------+--------+--------+--------+------------+--------+ +----------+--------+--------+--------+-------------------+ SubclavianPSV cm/sEDV cm/sDescribeArm Pressure (mmHG) +----------+--------+--------+--------+-------------------+           22                                          +----------+--------+--------+--------+-------------------+ +---------+--------+--+--------+--+ VertebralPSV cm/s57EDV cm/s12 +---------+--------+--+--------+--+  Summary: Right Carotid: The extracranial vessels were near-normal with only minimal wall                thickening or plaque. Left Carotid: The extracranial vessels were near-normal with only minimal wall               thickening or plaque. Vertebrals:  Bilateral vertebral arteries demonstrate antegrade flow. Subclavians: Normal flow hemodynamics were seen in bilateral subclavian              arteries. *See table(s) above for measurements and observations.  Electronically signed by Antony Contras MD on 11/20/2018 at 1:54:11 PM.     Final    Recent Labs    11/18/18 1416  WBC 9.4  HGB 14.4  HCT 43.9  PLT 254   Recent Labs    11/18/18 1416  NA 143  K 3.5  CL 109  CO2 25  GLUCOSE 99  BUN 16  CREATININE 0.90  CALCIUM 9.2    Intake/Output Summary (Last 24 hours) at 11/21/2018 0727 Last data filed at 11/20/2018 2150 Gross per 24 hour  Intake 320 ml  Output -  Net 320 ml     Physical Exam: Vital Signs Blood pressure (!) 160/86, pulse 80, temperature 98.5 F (36.9 C), temperature source Oral, resp. rate 15, height 5\' 5"  (1.651 m), weight 68.3 kg, SpO2 96 %.  General: No acute distress Mood and affect are appropriate Heart: Regular rate and rhythm no rubs murmurs or extra sounds Lungs: Clear to auscultation, breathing unlabored, no rales or wheezes Abdomen: Positive bowel sounds, soft nontender to palpation, nondistended Extremities: No clubbing, cyanosis, or edema Skin: No evidence of breakdown, no evidence of rash Neurologic: Cranial nerves II through XII intact, motor strength is 5/5 in left and 4/5 in RIght deltoid, bicep, tricep, grip, hip flexor, knee extensors, ankle dorsiflexor and plantar flexor Sensory exam normal sensation to light touch and proprioception in bilateral upper and lower extremities Cerebellar exam normal finger to nose to finger as well as heel to shin in bilateral upper and lower extremities Musculoskeletal: Full range of motion in all 4 extremities. No joint swelling    Assessment/Plan: 1. Functional deficits secondary to Left corona radiata and posterior MCA infarcts onset 11/18/18 which require 3+ hours per day of interdisciplinary therapy in a comprehensive inpatient rehab setting.  Physiatrist is providing close team supervision and 24 hour management of active medical problems listed below.  Physiatrist and rehab team continue to assess barriers to discharge/monitor patient progress toward functional and medical goals  Care Tool:  Bathing              Bathing  assist       Upper Body Dressing/Undressing Upper body dressing        Upper body assist      Lower  Body Dressing/Undressing Lower body dressing            Lower body assist       Toileting Toileting    Toileting assist Assist for toileting: Minimal Assistance - Patient > 75%(assisted pt on bedpan )     Transfers Chair/bed transfer  Transfers assist           Locomotion Ambulation   Ambulation assist              Walk 10 feet activity   Assist           Walk 50 feet activity   Assist           Walk 150 feet activity   Assist           Walk 10 feet on uneven surface  activity   Assist           Wheelchair     Assist               Wheelchair 50 feet with 2 turns activity    Assist            Wheelchair 150 feet activity     Assist            Medical Problem List and Plan: 1.Right side weaknesssecondary to embolicleftterritoryMCA infarction.Status post loop recorder -CIR PT OT initial evals today -ASA and plavix for 3 weeks then ASA 81mg  alone- on 12/09/2018 2. DVT Prophylaxis/Anticoagulation: subcutaneous Lovenox. Monitor for any bleeding episodes 3. Pain Management:Tylenol as needed 4. Mood:Abilify 5 mg daily, Bipolar disorder 5. Neuropsych: This patientiscapable of making decisions on herown behalf. 6. Skin/Wound Care:Routine skin checks 7. Fluids/Electrolytes/Nutrition:Routine in and out's with follow-up chemistries 8. Hypothyroidism. Synthroid 9. Hyperlipidemia. Lipitor 10.  Chronic insomnia will resume ambien but at 5mg  LOS: 1 days A FACE TO [place] Moncerrat [place] 11/21/2018, 7:27 AM

## 2018-11-22 ENCOUNTER — Inpatient Hospital Stay (HOSPITAL_COMMUNITY): Payer: Self-pay

## 2018-11-22 ENCOUNTER — Inpatient Hospital Stay (HOSPITAL_COMMUNITY): Payer: Medicare Other | Admitting: Speech Pathology

## 2018-11-22 ENCOUNTER — Inpatient Hospital Stay (HOSPITAL_COMMUNITY): Payer: Medicare Other

## 2018-11-22 ENCOUNTER — Inpatient Hospital Stay (HOSPITAL_COMMUNITY): Payer: Medicare Other | Admitting: Occupational Therapy

## 2018-11-22 MED ORDER — PROSIGHT PO TABS
1.0000 | ORAL_TABLET | Freq: Every day | ORAL | Status: DC
  Administered 2018-11-22 – 2018-12-06 (×15): 1 via ORAL
  Filled 2018-11-22 (×15): qty 1

## 2018-11-22 MED ORDER — CALCIUM CITRATE-VITAMIN D 500-500 MG-UNIT PO CHEW
1.0000 | CHEWABLE_TABLET | Freq: Every day | ORAL | Status: DC
  Filled 2018-11-22: qty 1

## 2018-11-22 MED ORDER — CALCIUM CARBONATE-VITAMIN D 500-200 MG-UNIT PO TABS
1.0000 | ORAL_TABLET | Freq: Every day | ORAL | Status: DC
  Administered 2018-11-22 – 2018-12-06 (×15): 1 via ORAL
  Filled 2018-11-22 (×15): qty 1

## 2018-11-22 MED ORDER — SENNOSIDES-DOCUSATE SODIUM 8.6-50 MG PO TABS
2.0000 | ORAL_TABLET | Freq: Two times a day (BID) | ORAL | Status: DC
  Administered 2018-11-22 – 2018-12-06 (×29): 2 via ORAL
  Filled 2018-11-22 (×29): qty 2

## 2018-11-22 NOTE — Progress Notes (Signed)
Physical Therapy Session Note  Patient Details  Name: [name] [name] MRN: [id number] Date of Birth: [birth year]  Today's Date: 11/22/2018 PT Individual Time: 1345-1500 PT Individual Time Calculation (min): 75 min   Short Term Goals: Week 1:  PT Short Term Goal 1 (Week 1): Pt will perform bed<>chair transfer min assist PT Short Term Goal 2 (Week 1): Pt will ambulate x 50 ft min assist with LRAD PT Short Term Goal 3 (Week 1): Pt will perform sit<>stand from w/c or mat with min assist  Skilled Therapeutic Interventions/Progress Updates:    Pt using bathroom with assist from NT upon PT arrival, agreeable to therapy tx and denies pain. Stand pivot to w/c with mod assist and pt transported to the gym. Pt performed stand pivot to w/c with min assist. Pt performed 2 x 5 sit<>stands working on LE strengthening and R LE weightbearing, tactile cues for R LE extension in standing. Pt worked on R LE knee control and weightbearing to perform stepping in place with L LE 2 x 10 steps, mod assist. PT worked on stepping in place with R LE, difficulty laterally shifting to left side with manual facilitation needed to shift left and clear R foot, verbal cues for techniques, mod-max assist to correct R lateral lean/LOB. Stand pivot to w/c with min assist. Pt ambulated 2 x 20 ft this session using L rail and mod assist, R Ace wrap for DF toe clearance, verbal cues for sequencing and step length, tactile cues for knee extension during R LE stance phase, manual facilitation for L lateral weightshift during R swing phase. Pt ambulated x 10 ft with RW and max assist, R lateral lean and R knee flexion with max cues to correct. Pt transported back to room and transferred to bed min assist stand pivot. Pt transferred to supine and left with needs in reach and bed alarm set.   Therapy Documentation Precautions:  Precautions Precautions: Fall Precaution Comments: R hemi, R knee buckles Restrictions Weight Bearing Restrictions:  No   Therapy/Group: Individual Therapy  Netta Corrigan, PT, DPT 11/22/2018, 7:53 AM

## 2018-11-22 NOTE — Progress Notes (Signed)
Speech Language Pathology Discharge Summary  Patient Details  Name: [name] [name] MRN: [id number] Date of Birth: 04/21/1933  Today's Date: 11/22/2018 SLP Individual Time: 0905-0952 SLP Individual Time Calculation (min): 47 min   Skilled Therapeutic Interventions:  Pt was seen for skilled ST focused on cognition. SLP facilitated session with medication management activity to target functional complex problem solving. Pt recalled the function of all medications (as well as the dosage for nearly all) when provided the name of each medicine. Provided supervision verbal cues, pt interpreted list of medications and dosages and set up weekly pill box with impressive accuracy; pt independently detected and correct 2 errors and during distribution of pills into pill box. SLP recommended pt have supervision with medication administration for short period of time when she returns home to maximize safety. Given that pt met all ST goals and education was complete, ST session was ended 13 minutes early. Pt was left in bed with bed alarm set, call bell within reach, and all needs met. ST will sign off at this time.     Patient has met 1 of 1 long term goals.  Patient to discharge at overall Supervision level.  Reasons goals not met:     Clinical Impression/Discharge Summary:   Pt has made functional gains while inpatient and is discharging from ST services having met 1 out of 1 long term goals.  Pt is currently supervision for tasks. Pt education is complete at this time.  Pt has demonstrated improved functional complex problem solving skills.  Pt achieved all cognitive goals and is discharging from ST services at this time.   Care Partner:  Caregiver Able to Provide Assistance: Yes  Type of Caregiver Assistance: Cognitive  Recommendation:  None      Equipment: none   Reasons for discharge: Treatment goals met   Patient/Family Agrees with Progress Made and Goals Achieved: Yes    , Student  SLP     11/22/2018, 3:24 PM    

## 2018-11-22 NOTE — Plan of Care (Signed)
  Problem: Consults Goal: RH STROKE PATIENT EDUCATION Description See Patient Education module for education specifics  Outcome: Progressing   Problem: RH BOWEL ELIMINATION Goal: RH STG MANAGE BOWEL WITH ASSISTANCE Description STG Manage Bowel with [place].  Outcome: Progressing Goal: RH STG MANAGE BOWEL W/MEDICATION W/ASSISTANCE Description STG Manage Bowel with Medication with [place].  Outcome: Progressing   Problem: RH BLADDER ELIMINATION Goal: RH STG MANAGE BLADDER WITH ASSISTANCE Description STG Manage Bladder With Min Assistance  Outcome: Progressing   Problem: RH SKIN INTEGRITY Goal: RH STG MAINTAIN SKIN INTEGRITY WITH ASSISTANCE Description STG Maintain Skin Integrity With [place].  Outcome: Progressing   Problem: RH SAFETY Goal: RH STG ADHERE TO SAFETY PRECAUTIONS W/ASSISTANCE/DEVICE Description STG Adhere to Safety Precautions With Min Assistance/Device.  Outcome: Progressing   Problem: RH KNOWLEDGE DEFICIT Goal: RH STG INCREASE KNOWLEDGE OF HYPERTENSION Description Pt will be able to verbalize stated parameters set by physician for hypertension with min assist  Outcome: Progressing Goal: RH STG INCREASE KNOWLEDGE OF STROKE PROPHYLAXIS Outcome: Progressing

## 2018-11-22 NOTE — Progress Notes (Signed)
[facility] PHYSICAL MEDICINE & REHABILITATION PROGRESS NOTE   Subjective/Complaints:  Patient slept better with Ambien.  No new issues overnight.,  Reviewed labs  ROS- denies CP, SOB, N/V/D  Objective:   No results found. Recent Labs    11/21/18 0656  WBC 6.8  HGB 13.3  HCT 40.9  PLT 215   Recent Labs    11/21/18 0656  NA 141  K 3.3*  CL 108  CO2 22  GLUCOSE 99  BUN 17  CREATININE 0.96  CALCIUM 8.9    Intake/Output Summary (Last 24 hours) at 11/22/2018 0932 Last data filed at 11/21/2018 2023 Gross per 24 hour  Intake 480 ml  Output -  Net 480 ml     Physical Exam: Vital Signs Blood pressure (!) 157/93, pulse 84, temperature 98.2 F (36.8 C), temperature source Oral, resp. rate 18, height _0  (1.651 m), weight 68.2 kg, SpO2 97 %.  General: No acute distress Mood and affect are appropriate Heart: Regular rate and rhythm no rubs murmurs or extra sounds Lungs: Clear to auscultation, breathing unlabored, no rales or wheezes Abdomen: Positive bowel sounds, soft nontender to palpation, nondistended Extremities: No clubbing, cyanosis, or edema Skin: No evidence of breakdown, no evidence of rash Neurologic: Cranial nerves II through XII intact, motor strength is 5/5 in left and 4/5 in RIght deltoid, bicep, tricep, grip, hip flexor, knee extensors, ankle dorsiflexor and plantar flexor Sensory exam normal sensation to light touch and proprioception in bilateral upper and lower extremities Cerebellar exam normal finger to nose to finger as well as heel to shin in bilateral upper and lower extremities Musculoskeletal: Full range of motion in all 4 extremities. No joint swelling    Assessment/Plan: 1. Functional deficits secondary to Left corona radiata and posterior MCA infarcts onset 11/18/18 which require 3+ hours per day of interdisciplinary therapy in a comprehensive inpatient rehab setting.  Physiatrist is providing close team supervision and 24 hour management of  active medical problems listed below.  Physiatrist and rehab team continue to assess barriers to discharge/monitor patient progress toward functional and medical goals  Care Tool:  Bathing    Body parts bathed by patient: Right arm, Left arm, Chest, Abdomen, Front perineal area, Face, Right upper leg, Left upper leg   Body parts bathed by helper: Buttocks, Right lower leg, Left lower leg     Bathing assist Assist Level: Moderate Assistance - Patient 50 - 74%     Upper Body Dressing/Undressing Upper body dressing   What is the patient wearing?: Pull over shirt    Upper body assist Assist Level: Minimal Assistance - Patient > 75%    Lower Body Dressing/Undressing Lower body dressing      What is the patient wearing?: Underwear/pull up     Lower body assist Assist for lower body dressing: Moderate Assistance - Patient 50 - 74%     Toileting Toileting    Toileting assist Assist for toileting: Moderate Assistance - Patient 50 - 74%     Transfers Chair/bed transfer  Transfers assist     Chair/bed transfer assist level: Moderate Assistance - Patient 50 - 74%     Locomotion Ambulation   Ambulation assist      Assist level: Maximal Assistance - Patient 25 - 49% Assistive device: Other (comment)(no device) Max distance: 10 ft   Walk 10 feet activity   Assist     Assist level: Maximal Assistance - Patient 25 - 49% Assistive device: Other (comment)(no device)   Walk  50 feet activity   Assist Walk 50 feet with 2 turns activity did not occur: Safety/medical concerns(R hemiparesis, buckling)         Walk 150 feet activity   Assist Walk 150 feet activity did not occur: Safety/medical concerns         Walk 10 feet on uneven surface  activity   Assist Walk 10 feet on uneven surfaces activity did not occur: Safety/medical concerns         Wheelchair     Assist Will patient use wheelchair at discharge?: Yes Type of Wheelchair: Manual     Wheelchair assist level: Minimal Assistance - Patient > 75% Max wheelchair distance: 50 ft    Wheelchair 50 feet with 2 turns activity    Assist        Assist Level: Minimal Assistance - Patient > 75%   Wheelchair 150 feet activity     Assist Wheelchair 150 feet activity did not occur: Safety/medical concerns          Medical Problem List and Plan: 1.Right side weaknesssecondary to embolicleftterritoryMCA infarction.Status post loop recorder -CIR PT OT , Team conference today please see physician documentation under team conference tab, met with team face-to-face to discuss problems,progress, and goals. Formulized individual treatment plan based on medical history, underlying problem and comorbidities. -ASA and plavix for 3 weeks then ASA 32m alone- on 12/09/2018 2. DVT Prophylaxis/Anticoagulation: subcutaneous Lovenox. Monitor for any bleeding episodes 3. Pain Management:Tylenol as needed 4. Mood:Abilify 5 mg daily, Bipolar disorder 5. Neuropsych: This patientiscapable of making decisions on herown behalf. 6. Skin/Wound Care:Routine skin checks 7. Fluids/Electrolytes/Nutrition:Routine in and out's with follow-up chemistries 8. Hypothyroidism. Synthroid 9. Hyperlipidemia. Lipitor 10.  Chronic insomnia will resume ambien but at 528m11.  Hypokalemia we will supplement with KCl 2043mBID LOS: [place] 11/22/2018, 9:32 AM

## 2018-11-22 NOTE — Progress Notes (Signed)
Occupational Therapy Session Note  Patient Details  Name: [name] [name] MRN: [id number] Date of Birth: [birth year]  Today's Date: 11/22/2018 OT Individual Time: 1100-1200 OT Individual Time Calculation (min): 60 min    Short Term Goals: Week 1:  OT Short Term Goal 1 (Week 1): Pt will complete grooming task bimanually with CGA to demonstrate improved R UE coordination OT Short Term Goal 2 (Week 1): Pt will transfer to toilet with min A OT Short Term Goal 3 (Week 1): Pt will don pants with mod A OT Short Term Goal 4 (Week 1): Pt will feed self 1/2 of meal with R UE with 75% accuracy   Skilled Therapeutic Interventions/Progress Updates:    Pt received in w/c and ready for therapy. She declined bathing as she says she does not bathe everyday due to excema.  She wanted to keep wearing what she wore last night.  Pt did want to don face cream and attempted with her dominant R hand but was not able to reach hand well to her cheek or coordinate the movement to rub it in so had to use her L hand.   Pt taken to therapy gym to focus on RUE NMR shoulder AROM and hand coordination as well as attention to her R side.  Pt transferred to mat to L with min-mod A squat pivot.  When asked to lift B arms at the same time she had a significant lag in R shoulder and was not able to bring it fully into sh flexion. With B hands on dowel bar pt needed support under R arm.  She worked on dowel bar exercises with min A for R side and Ue ranger with min A for guidance of movement.  In supine, she was able to bring dowel bar overhead with increased ease. Instructed pt to practice this in room with clasped hands and she was able to repeat demonstrate well.    For R hand coordination, worked on ball grasp and release, pronation/ supination, stacking cones and removing pegs from foam board. She was not able to place the pegs but could remove them with extra effort.  Pt did need cuing to visually attend to hand.    Overall good  participation and effort. Pt was returned to her room, belt alarm on and meal tray set up for patient. Pt in room with all needs met, call alarm in reach.  Therapy Documentation Precautions:  Precautions Precautions: Fall Precaution Comments: R hemi, R knee buckles Restrictions Weight Bearing Restrictions: No     Pain: Pain Assessment Pain Score: 0-No pain     Therapy/Group: Individual Therapy  [place] 11/22/2018, 12:29 PM

## 2018-11-22 NOTE — Plan of Care (Signed)
  Problem: Consults Goal: RH STROKE PATIENT EDUCATION Description See Patient Education module for education specifics  Outcome: Progressing   Problem: RH SKIN INTEGRITY Goal: RH STG MAINTAIN SKIN INTEGRITY WITH ASSISTANCE Description STG Maintain Skin Integrity With [place].  Outcome: Progressing   Problem: RH KNOWLEDGE DEFICIT Goal: RH STG INCREASE KNOWLEDGE OF HYPERTENSION Description Pt will be able to verbalize stated parameters set by physician for hypertension with min assist  Outcome: Progressing Goal: RH STG INCREASE KNOWLEDGE OF STROKE PROPHYLAXIS Outcome: Progressing

## 2018-11-22 NOTE — Progress Notes (Signed)
Social Work Patient ID: [name] [name], female   DOB: February 07, 1933, 85 y.o.   MRN: [id number]   Attempted to see pt yesterday and again today without success.  Briefly introduced self to pt today, but she was not available for full visit.  CSW will meet with pt tomorrow and complete full assessment.

## 2018-11-23 ENCOUNTER — Inpatient Hospital Stay (HOSPITAL_COMMUNITY): Payer: Medicare Other

## 2018-11-23 ENCOUNTER — Inpatient Hospital Stay (HOSPITAL_COMMUNITY): Payer: Medicare Other | Admitting: Physical Therapy

## 2018-11-23 ENCOUNTER — Telehealth: Payer: Self-pay | Admitting: *Deleted

## 2018-11-23 NOTE — Plan of Care (Signed)
  Problem: Consults Goal: RH STROKE PATIENT EDUCATION Description See Patient Education module for education specifics  Outcome: Progressing   Problem: RH BOWEL ELIMINATION Goal: RH STG MANAGE BOWEL WITH ASSISTANCE Description STG Manage Bowel with [place].  Outcome: Progressing Goal: RH STG MANAGE BOWEL W/MEDICATION W/ASSISTANCE Description STG Manage Bowel with Medication with [place].  Outcome: Progressing   Problem: RH BLADDER ELIMINATION Goal: RH STG MANAGE BLADDER WITH ASSISTANCE Description STG Manage Bladder With Min Assistance  Outcome: Progressing   Problem: RH SAFETY Goal: RH STG ADHERE TO SAFETY PRECAUTIONS W/ASSISTANCE/DEVICE Description STG Adhere to Safety Precautions With Min Assistance/Device.  Outcome: Progressing

## 2018-11-23 NOTE — Plan of Care (Signed)
  Problem: RH BOWEL ELIMINATION Goal: RH STG MANAGE BOWEL WITH ASSISTANCE Description STG Manage Bowel with Min Assistance.  Outcome: Progressing   Problem: RH BLADDER ELIMINATION Goal: RH STG MANAGE BLADDER WITH ASSISTANCE Description STG Manage Bladder With Min Assistance  Outcome: Progressing   Problem: RH SKIN INTEGRITY Goal: RH STG MAINTAIN SKIN INTEGRITY WITH ASSISTANCE Description STG Maintain Skin Integrity With [place].  Outcome: Progressing   Problem: RH SAFETY Goal: RH STG ADHERE TO SAFETY PRECAUTIONS W/ASSISTANCE/DEVICE Description STG Adhere to Safety Precautions With Min Assistance/Device.  Outcome: Progressing

## 2018-11-23 NOTE — Telephone Encounter (Signed)
Spoke with Wallace, RN, to request that she send a manual [employer] transmission from patient's LINQ monitor. Instructions given. She agrees to send transmission today. Will check back tomorrow.

## 2018-11-23 NOTE — Progress Notes (Addendum)
Physical Therapy Session Note  Patient Details  Name: [name] [name] MRN: [id number] Date of Birth: February 03, 1933  Today's Date: 11/23/2018 PT Individual Time: 0800-0900 AND 1525-1630 PT Individual Time Calculation (min): 60 min AND 65 min  Short Term Goals: Week 1:  PT Short Term Goal 1 (Week 1): Pt will perform bed<>chair transfer min assist PT Short Term Goal 2 (Week 1): Pt will ambulate x 50 ft min assist with LRAD PT Short Term Goal 3 (Week 1): Pt will perform sit<>stand from w/c or mat with min assist  Skilled Therapeutic Interventions/Progress Updates:   Session 1: Pt in supine and agreeable to therapy, no c/o pain. Pt stating MD ordered her TED hose, confirmed w/ MD who requested knee high TEDs. Transferred to EOB w/ min assist and donned TED hose and pants w/ total assist for time management, mod assist to bring pants over hips once in standing. Mod assist transfer to w/c and total assist transport to/from therapy gym. Worked on sit<>stands from elevated mat w/ emphasis on reaching full upright w/o UE support and minimizing posterior lean bias and LOB. Blocked practice of sit<>stands w/ block in between feet to maintain normal BOS. Mod assist fading to CGA w/ repetition. Tactile cues for glut activation and R quad activation in stance, tactile cues for trunk upright, and verbal cues to bring hips forward and for anterior trunk lean during initial movement. Returned to room and provided set-up assist w/ brushing teeth and washing face. Ended session in w/c, all needs in reach.    Session 2:  Pt in w/c and agreeable to therapy, no c/o pain. Total assist w/c transport to/from therapy gym for time management. Worked on gait and pre-gait training this session w/ RW. Forward and backward stepping w/ LLE w/ emphasis on R quad activation in single leg stance. RLE forward and backward stepping to work on R hip flexion w/ stepping to visual target. Pt w/ mild scissoring, needed min assist to correct.  Ambulated 15' x2 w/ RW and min-mod assist. Min assist for R knee extension in single leg stance, min-mod assist for R foot placement to prevent scissoring, and min assist for upright balance and RW management. Pt ambulated very slow gait speed and used toe cap for increased R foot clearance. NuStep 8 min @ level 3 w/ LEs only to work on RLE muscle activation. Manual assist needed to prevent excessive R hip adduction. Returned to room and ended session in w/c, all needs in reach. Provided w/ towel roll for neutral R hip alignment while resting in w/c.   Therapy Documentation Precautions:  Precautions Precautions: Fall Precaution Comments: R hemi, R knee buckles Restrictions Weight Bearing Restrictions: No Pain: Pain Assessment Pain Scale: 0-10 Pain Score: 0-No pain  Therapy/Group: Individual Therapy  Maxfield Gildersleeve K Nancey Kreitz 11/23/2018, 9:00 AM

## 2018-11-23 NOTE — IPOC Note (Addendum)
Overall Plan of Care [facility]) Patient Details Name: [name] [name] MRN: [id number] DOB: 1933-09-01  Admitting Diagnosis: <principal problem not specified>  Hospital Problems: Active Problems:   Left middle cerebral artery stroke [facility])     Functional Problem List: Nursing Bladder, Bowel, Edema  PT Balance, Behavior, Motor, Nutrition, Edema, Pain, Perception, Safety, Sensory, Skin Integrity  OT Balance, Cognition, Perception, Safety, Endurance, Sensory, Motor, Skin Integrity  SLP Cognition  TR   Activity tolerance, functional mobility, balance, cognition, safety, pain, anxiety/stress       Basic ADL's: OT Eating, Grooming, Bathing, Toileting, Dressing     Advanced  ADL's: OT       Transfers: PT Bed Mobility, Bed to Chair, Car, Manufacturing systems engineer, Metallurgist: PT Ambulation, Emergency planning/management officer, Stairs     Additional Impairments: OT Fuctional Use of Upper Extremity  SLP Social Cognition   Problem Solving  TR  community integration    Anticipated Outcomes Item Anticipated Outcome  Self Feeding mod I  Swallowing      Basic self-care  (S)  Toileting  (S)   Bathroom Transfers (S)  Bowel/Bladder  pt will be continent of b/b x 2  Transfers  supervision  Locomotion  supervision   Communication     Cognition  supervision  Pain  less than 2  Safety/Judgment  to will remain free of falls    Therapy Plan: PT Intensity: Minimum of 1-2 x/day ,45 to 90 minutes PT Frequency: 5 out of 7 days PT Duration Estimated Length of Stay: 14-16 days OT Intensity: Minimum of 1-2 x/day, 45 to 90 minutes OT Frequency: 5 out of 7 days OT Duration/Estimated Length of Stay: 14-18 days SLP Intensity: Minumum of 1-2 x/day, 30 to 90 minutes SLP Frequency: 1 to 3 out of 7 days SLP Duration/Estimated Length of Stay: 1-2 sessions  TR Duration/ELOS:  Discharge 2/21 TR Frequency:  Min 1 >25 minutes for community reintegration during LOS    Team  Interventions: Nursing Interventions Patient/Family Education, Bladder Management, Bowel Management, Disease Management/Prevention, Discharge Planning  PT interventions Ambulation/gait training, Cognitive remediation/compensation, Training and development officer, Disease management/prevention, Community reintegration, Discharge planning, DME/adaptive equipment instruction, Functional mobility training, Pain management, Psychosocial support, Splinting/orthotics, Therapeutic Activities, UE/LE Strength taining/ROM, Functional electrical stimulation, Neuromuscular re-education, Patient/family education, Skin care/wound management, Stair training, Therapeutic Exercise, UE/LE Coordination activities, Wheelchair propulsion/positioning  OT Interventions Training and development officer, Community reintegration, Disease mangement/prevention, Neuromuscular re-education, Barrister's clerk education, Self Care/advanced ADL retraining, Therapeutic Exercise, UE/LE Coordination activities, Wheelchair propulsion/positioning, Visual/perceptual remediation/compensation, Therapeutic Activities, Skin care/wound managment, Psychosocial support, Pain management, Functional mobility training, Discharge planning, Cognitive remediation/compensation, DME/adaptive equipment instruction, UE/LE Strength taining/ROM  SLP Interventions Cognitive remediation/compensation, Internal/external aids, Cueing hierarchy, Functional tasks, Patient/family education  TR Interventions   Recreation/leisure participation, functional mobility, community reintegration, pt/family education, adaptive equipment instruction/use, discharge planning, psychosocial support   SW/CM Interventions Discharge Planning, Psychosocial Support, Patient/Family Education   Barriers to Discharge MD  Medical stability and Lack of/limited family support  Nursing      PT Decreased caregiver support    OT      SLP      SW       Team Discharge Planning: Destination: PT-Home  ,OT- Home , SLP-Home Projected Follow-up: PT-Home health PT, OT-  Home health OT, SLP-None Projected Equipment Needs: PT- , OT- To be determined, SLP-None recommended by SLP Equipment Details: PT- , OT-  Patient/family involved in discharge planning: PT- Patient,  OT-Patient, Family member/caregiver, SLP-Patient  MD ELOS: 10-14d Medical  Rehab Prognosis:  Good Assessment:  85 year old right-handed female history of hypothyroidism. Per chart review she lives alone. One level home with level entry. Independent and active prior to admission. She has a son who is retired and offers good support.Presented 11/18/2018 with right-sided weakness following to the floor. Cranial CT scan showed a small linear right left frontal white matter hypodensity extending to the cortex. No hemorrhage. Patient did not receive TPA. MRI/MRA showed acute lacunar infarction in the posterior left corona radiata and posterior lentiform with 2 small superimposed small acute white matter infarctions in the posterior left MCA territory. No large vessel occlusion but positive for moderate stenosis of both the distal left A2 and left anterior M2 branches. Echocardiogram with ejection fraction of 65%. Systolic function was normal. Carotid Dopplers with no ICA stenosis. Neurology follow-up maintain on aspirin for CVA prophylaxis. TEE is pending. Subcutaneous Lovenox for DVT prophylaxis  Now requiring 24/7 Rehab RN,MD, as well as CIR level PT, OT and SLP.  Treatment team will focus on ADLs and mobility with goals set at Mod I See Team Conference Notes for weekly updates to the plan of care

## 2018-11-23 NOTE — Progress Notes (Signed)
[facility] PHYSICAL MEDICINE & REHABILITATION PROGRESS NOTE   Subjective/Complaints:  Still has swelling in LEs  ROS- denies CP, SOB, N/V/D  Objective:   No results found. Recent Labs    11/21/18 0656  WBC 6.8  HGB 13.3  HCT 40.9  PLT 215   Recent Labs    11/21/18 0656  NA 141  K 3.3*  CL 108  CO2 22  GLUCOSE 99  BUN 17  CREATININE 0.96  CALCIUM 8.9    Intake/Output Summary (Last 24 hours) at 11/23/2018 0729 Last data filed at 11/22/2018 2000 Gross per 24 hour  Intake 540 ml  Output -  Net 540 ml     Physical Exam: Vital Signs Blood pressure (!) 160/83, pulse 71, temperature 98.1 F (36.7 C), temperature source Oral, resp. rate 16, height 5\' 5"  (1.651 m), weight 68.2 kg, SpO2 96 %.  General: No acute distress Mood and affect are appropriate Heart: Regular rate and rhythm no rubs murmurs or extra sounds Lungs: Clear to auscultation, breathing unlabored, no rales or wheezes Abdomen: Positive bowel sounds, soft nontender to palpation, nondistended Extremities: No clubbing, cyanosis, or edema Skin: No evidence of breakdown, no evidence of rash Neurologic: Cranial nerves II through XII intact, motor strength is 5/5 in left and 4/5 in RIght deltoid, bicep, tricep, grip, hip flexor, knee extensors, ankle dorsiflexor and plantar flexor Sensory exam normal sensation to light touch and proprioception in bilateral upper and lower extremities Cerebellar exam normal finger to nose to finger as well as heel to shin in bilateral upper and lower extremities Musculoskeletal: Full range of motion in all 4 extremities. No joint swelling    Assessment/Plan: 1. Functional deficits secondary to Left corona radiata and posterior MCA infarcts onset 11/18/18 which require 3+ hours per day of interdisciplinary therapy in a comprehensive inpatient rehab setting.  Physiatrist is providing close team supervision and 24 hour management of active medical problems listed  below.  Physiatrist and rehab team continue to assess barriers to discharge/monitor patient progress toward functional and medical goals  Care Tool:  Bathing    Body parts bathed by patient: Right arm, Left arm, Chest, Abdomen, Front perineal area, Face, Right upper leg, Left upper leg   Body parts bathed by helper: Buttocks, Right lower leg, Left lower leg     Bathing assist Assist Level: Moderate Assistance - Patient 50 - 74%     Upper Body Dressing/Undressing Upper body dressing   What is the patient wearing?: Pull over shirt    Upper body assist Assist Level: Minimal Assistance - Patient > 75%    Lower Body Dressing/Undressing Lower body dressing      What is the patient wearing?: Underwear/pull up     Lower body assist Assist for lower body dressing: Moderate Assistance - Patient 50 - 74%     Toileting Toileting    Toileting assist Assist for toileting: Moderate Assistance - Patient 50 - 74%     Transfers Chair/bed transfer  Transfers assist     Chair/bed transfer assist level: Minimal Assistance - Patient > 75%     Locomotion Ambulation   Ambulation assist      Assist level: Maximal Assistance - Patient 25 - 49% Assistive device: Walker-rolling Max distance: 10 ft   Walk 10 feet activity   Assist     Assist level: Maximal Assistance - Patient 25 - 49% Assistive device: Walker-rolling, Orthosis   Walk 50 feet activity   Assist Walk 50 feet with 2 turns  activity did not occur: Safety/medical concerns(R hemiparesis, buckling)         Walk 150 feet activity   Assist Walk 150 feet activity did not occur: Safety/medical concerns         Walk 10 feet on uneven surface  activity   Assist Walk 10 feet on uneven surfaces activity did not occur: Safety/medical concerns         Wheelchair     Assist Will patient use wheelchair at discharge?: Yes Type of Wheelchair: Manual    Wheelchair assist level: Minimal Assistance  - Patient > 75% Max wheelchair distance: 50 ft    Wheelchair 50 feet with 2 turns activity    Assist        Assist Level: Minimal Assistance - Patient > 75%   Wheelchair 150 feet activity     Assist Wheelchair 150 feet activity did not occur: Safety/medical concerns          Medical Problem List and Plan: 1.Right side weaknesssecondary to embolicleftterritoryMCA infarction.Status post loop recorder -CIR PT OT , -ASA and plavix for 3 weeks then ASA 81mg  alone- on 12/09/2018 2. DVT Prophylaxis/Anticoagulation: subcutaneous Lovenox. Monitor for any bleeding episodes 3. Pain Management:Tylenol as needed 4. Mood:Abilify 5 mg daily, Bipolar disorder 5. Neuropsych: This patientiscapable of making decisions on herown behalf. 6. Skin/Wound Care:Routine skin checks 7. Fluids/Electrolytes/Nutrition:Routine in and out's with follow-up chemistries I:562ml 8. Hypothyroidism. Synthroid 9. Hyperlipidemia. Lipitor 10.  Chronic insomnia will resume ambien but at 5mg  11.  Hypokalemia we will supplement with KCl 34meq BID, check BMET in am LOS: 3 days A FACE TO [place] E Matsuko Kretz 11/23/2018, 7:29 AM

## 2018-11-23 NOTE — Progress Notes (Signed)
Occupational Therapy Session Note  Patient Details  Name: [name] [name] MRN: [id number] Date of Birth: 13-Jan-1933  Today's Date: 11/23/2018 OT Individual Time: 1100-1158 OT Individual Time Calculation (min): 58 min    Short Term Goals: Week 1:  OT Short Term Goal 1 (Week 1): Pt will complete grooming task bimanually with CGA to demonstrate improved R UE coordination OT Short Term Goal 2 (Week 1): Pt will transfer to toilet with min A OT Short Term Goal 3 (Week 1): Pt will don pants with mod A OT Short Term Goal 4 (Week 1): Pt will feed self 1/2 of meal with R UE with 75% accuracy   Skilled Therapeutic Interventions/Progress Updates:    1:1. Pt received in w/c with son present talking to CSW. Pt completes stand pivot transfers throughout session with MIN A using grab bars for toilet transfer and A to step RLE (MOD A overall) pt able to completes clothing management with  Min A and requires VC for wiping after bladder void. Pt washes BUE with VC for getting in between fingers. Pt stands at counter in kitchen reaching with RUE (MIN A provided at elbow and tactile cues at scapula) to move bowls into/out of counter in PNF patterns for NMR. Pt stands during counter task with min A and VC/tactle cues for quad activation in RLE. Pt sits at table top turning over scrabble tiles, picking up tiles from table top with tip to tip pinch while stabilizing one tile in digits 3-5 for improved RUE FMC. Exited session with pt setaed in w/c, call light in reach, exit alamr on and all needs met  Therapy Documentation Precautions:  Precautions Precautions: Fall Precaution Comments: R hemi, R knee buckles Restrictions Weight Bearing Restrictions: No General:   Vital Signs:  Pain:   ADL: ADL Eating: Minimal assistance Grooming: Minimal assistance Where Assessed-Grooming: Wheelchair Upper Body Bathing: Minimal cueing, Minimal assistance Where Assessed-Upper Body Bathing: Shower Lower Body Bathing:  Moderate assistance, Minimal cueing Where Assessed-Lower Body Bathing: Shower Where Assessed-Upper Body Dressing: Wheelchair Lower Body Dressing: Maximal assistance, Minimal cueing Where Assessed-Lower Body Dressing: Wheelchair Toileting: Moderate assistance Where Assessed-Toileting: Glass blower/designer: Moderate assistance, Moderate verbal cueing Toilet Transfer Method: Stand pivot Toilet Transfer Equipment: Energy manager: Moderate assistance, Minimal cueing Social research officer, government Method: Stand pivot Youth worker: Shower seat with back, Landscape architect   Exercises:   Other Treatments:     Therapy/Group: Individual Therapy  Tonny Branch 11/23/2018, 12:14 PM

## 2018-11-24 ENCOUNTER — Inpatient Hospital Stay (HOSPITAL_COMMUNITY): Payer: Medicare Other | Admitting: Physical Therapy

## 2018-11-24 ENCOUNTER — Inpatient Hospital Stay (HOSPITAL_COMMUNITY): Payer: Medicare Other

## 2018-11-24 DIAGNOSIS — I169 Hypertensive crisis, unspecified: Secondary | ICD-10-CM

## 2018-11-24 DIAGNOSIS — E876 Hypokalemia: Secondary | ICD-10-CM

## 2018-11-24 DIAGNOSIS — I10 Essential (primary) hypertension: Secondary | ICD-10-CM

## 2018-11-24 DIAGNOSIS — R0989 Other specified symptoms and signs involving the circulatory and respiratory systems: Secondary | ICD-10-CM

## 2018-11-24 LAB — BASIC METABOLIC PANEL
Anion gap: 12 (ref 5–15)
BUN: 15 mg/dL (ref 8–23)
CO2: 23 mmol/L (ref 22–32)
Calcium: 9.2 mg/dL (ref 8.9–10.3)
Chloride: 105 mmol/L (ref 98–111)
Creatinine, Ser: 0.94 mg/dL (ref 0.44–1.00)
GFR calc Af Amer: >60 mL/min (ref >60)
GFR calc non Af Amer: 56 mL/min — ABNORMAL LOW (ref >60)
Glucose, Bld: 104 mg/dL — ABNORMAL HIGH (ref 70–99)
Potassium: 4 mmol/L (ref 3.5–5.1)
Sodium: 140 mmol/L (ref 135–145)

## 2018-11-24 NOTE — Telephone Encounter (Signed)
Reviewed ECGs with Chanetta Marshall, NP. Plan to review episodes with Dr. Lovena Le when he is back in the office on 11/28/18.

## 2018-11-24 NOTE — Progress Notes (Signed)
Physical Therapy Session Note  Patient Details  Name: [name] [name] MRN: [id number] Date of Birth: 1933-06-25  Today's Date: 11/24/2018 PT Individual Time: 0900-0955 PT Individual Time Calculation (min): 55 min   Short Term Goals: Week 1:  PT Short Term Goal 1 (Week 1): Pt will perform bed<>chair transfer min assist PT Short Term Goal 2 (Week 1): Pt will ambulate x 50 ft min assist with LRAD PT Short Term Goal 3 (Week 1): Pt will perform sit<>stand from w/c or mat with min assist  Skilled Therapeutic Interventions/Progress Updates:   Pt received toileting w/ RN, min assist transfer back to w/c after toileting. Pt agreeable to therapy and denies pain. Donned pants w/ mod assist, and TEDs and shoes w/ total assist for time management. Pt brushed teeth and washed face at sink w/ supervision and verbal cues for modified strategies while using 1 UE. Instructed pt on self-propelling w/c, for increased independence, via L hemi technique, pt needed min-mod assist to propel. Verbal and visual cues throughout for technique. Worked on pre-gait tasks w/ RW in therapy gym. RLE step forward and backward to visual target and to 2" step w/ emphasis on R foot clearance, increased hip flexion, step placement, and to minimize adduction/scissoring. Multiple bouts of standing w/ intermittent seated rest breaks. Returned to room, total assist in w/c for time management. Ended session in w/c, all needs in reach.   Therapy Documentation Precautions:  Precautions Precautions: Fall Precaution Comments: R hemi, R knee buckles Restrictions Weight Bearing Restrictions: No Vital Signs: Therapy Vitals BP: 121/67 Pain: Pain Assessment Pain Scale: 0-10 Pain Score: 0-No pain  Therapy/Group: Individual Therapy  Analia Zuk K Sharron Petruska 11/24/2018, 10:30 AM

## 2018-11-24 NOTE — Progress Notes (Signed)
Social Work Patient ID: [name] [name], female   DOB: [birth year], 85 y.o.   MRN: [id number]   CSW met with pt and pt's son 11-23-18 to update them on team conference discussion and targeted d/c date of 12-08-18.  Son and pt are already encouraged by pt's condition/progress at this point and are glad that pt has two more weeks on CIR.  Pt's son and dtr-in-law will take turns being with pt for a few weeks and if she needs more supervision, they are considering ALF or paid caregivers in pt's home.  CSW will continue to follow and assist as needed.

## 2018-11-24 NOTE — Progress Notes (Signed)
[facility] PHYSICAL MEDICINE & REHABILITATION PROGRESS NOTE   Subjective/Complaints: Patient seen laying in bed this morning.  She states she slept well overnight.  ROS-denies CP, SOB, N/V/D  Objective:   No results found. No results for input(s): WBC, HGB, HCT, PLT in the last 72 hours. Recent Labs    11/24/18 0608  NA 140  K 4.0  CL 105  CO2 23  GLUCOSE 104*  BUN 15  CREATININE 0.94  CALCIUM 9.2    Intake/Output Summary (Last 24 hours) at 11/24/2018 0933 Last data filed at 11/23/2018 1844 Gross per 24 hour  Intake 300 ml  Output -  Net 300 ml     Physical Exam: Vital Signs Blood pressure 121/67, pulse 90, temperature 97.6 F (36.4 C), temperature source Oral, resp. rate 16, height 5\' 5"  (1.651 m), weight 68.2 kg, SpO2 97 %. Constitutional: No distress . Vital signs reviewed. HENT: Normocephalic.  Atraumatic.  Poor dentition. Eyes: EOMI. No discharge. Cardiovascular: RRR. No JVD. Respiratory: CTA Bilaterally. Normal effort. GI: BS +. Non-distended. Musc: Lower extremity edema Neurologic: Alert and oriented x3 Motor: 5/5 LUE proximal to distal 3+-4-/5 LLE proximal to distal 4-4+/5 RUE proximal to distal 3/5 RLE proximal and distal Skin: No evidence of breakdown, no evidence of rash Psych: Normal mood.  Normal affect.  Assessment/Plan: 1. Functional deficits secondary to Left corona radiata and posterior MCA infarcts onset 11/18/18 which require 3+ hours per day of interdisciplinary therapy in a comprehensive inpatient rehab setting.  Physiatrist is providing close team supervision and 24 hour management of active medical problems listed below.  Physiatrist and rehab team continue to assess barriers to discharge/monitor patient progress toward functional and medical goals  Care Tool:  Bathing    Body parts bathed by patient: Right arm, Left arm, Chest, Abdomen, Front perineal area, Face, Right upper leg, Left upper leg   Body parts bathed by helper: Buttocks,  Right lower leg, Left lower leg     Bathing assist Assist Level: Moderate Assistance - Patient 50 - 74%     Upper Body Dressing/Undressing Upper body dressing   What is the patient wearing?: Pull over shirt    Upper body assist Assist Level: Minimal Assistance - Patient > 75%    Lower Body Dressing/Undressing Lower body dressing      What is the patient wearing?: Underwear/pull up     Lower body assist Assist for lower body dressing: Moderate Assistance - Patient 50 - 74%     Toileting Toileting    Toileting assist Assist for toileting: Moderate Assistance - Patient 50 - 74%     Transfers Chair/bed transfer  Transfers assist     Chair/bed transfer assist level: Moderate Assistance - Patient 50 - 74%     Locomotion Ambulation   Ambulation assist      Assist level: Moderate Assistance - Patient 50 - 74% Assistive device: Walker-rolling Max distance: 15'   Walk 10 feet activity   Assist     Assist level: Moderate Assistance - Patient - 50 - 74% Assistive device: Walker-rolling   Walk 50 feet activity   Assist Walk 50 feet with 2 turns activity did not occur: Safety/medical concerns(R hemiparesis, buckling)         Walk 150 feet activity   Assist Walk 150 feet activity did not occur: Safety/medical concerns         Walk 10 feet on uneven surface  activity   Assist Walk 10 feet on uneven surfaces activity did not  occur: Safety/medical concerns         Wheelchair     Assist Will patient use wheelchair at discharge?: Yes Type of Wheelchair: Manual    Wheelchair assist level: Minimal Assistance - Patient > 75% Max wheelchair distance: 50 ft    Wheelchair 50 feet with 2 turns activity    Assist        Assist Level: Minimal Assistance - Patient > 75%   Wheelchair 150 feet activity     Assist Wheelchair 150 feet activity did not occur: Safety/medical concerns          Medical Problem List and  Plan: 1.Right side weaknesssecondary to embolicleftterritory MCA infarction.Status post loop recorder Continue CIR  Notes reviewed- stroke, images reviewed, small left MCA infarcts, labs reviewed  ASA and plavix for 3 weeks then ASA 81mg  alone- on 12/09/2018 2. DVT Prophylaxis/Anticoagulation: subcutaneous Lovenox. Monitor for any bleeding episodes 3. Pain Management:Tylenol as needed 4. Mood:Abilify 5 mg daily, Bipolar disorder 5. Neuropsych: This patientiscapable of making decisions on herown behalf. 6. Skin/Wound Care:Routine skin checks 7. Fluids/Electrolytes/Nutrition:Routine in and out's   BMP within acceptable range on 2/7 8. Hypothyroidism. Synthroid 9. Hyperlipidemia. Lipitor 10.  Chronic insomnia: Resumed Ambien but at 5mg  11.  Hypokalemia:   Potassium 4.0 on 2/7  Continue KCl 73meq BID 12.  Labile blood pressure  Extremely labile with hypertensive crisis this morning  Monitor for trend  LOS: 4 days A FACE TO FACE EVALUATION WAS PERFORMED  Ankit Lorie Phenix 11/24/2018, 9:33 AM

## 2018-11-24 NOTE — Progress Notes (Signed)
Social Work Assessment and Plan  Patient Details  Name: [name] [name] MRN: [id number] Date of Birth: [birth year]  Today's Date: 11/23/2018  Problem List:  Patient Active Problem List   Diagnosis Date Noted  . Hypertensive crisis   . Labile blood pressure   . Hypokalemia   . 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 Acute arterial ischemic stroke, multifocal, posterior circulation, left ([place]) 11/19/2018  . Stroke ([place]) 11/18/2018  . Stroke (cerebrum) ([place]) 11/18/2018  . Myalgia 08/06/2018  . Left lower quadrant pain 08/29/2017  . Dyspepsia 03/25/2017  . Atherosclerosis of aorta ([place]) 09/27/2016  . COPD with acute bronchitis ([place]) 09/27/2016  . Acute blood loss anemia 03/16/2015  . Osteoarthritis of right knee 03/04/2015  . Status post total right knee replacement 03/04/2015  . Contusion of knee, right 10/23/2014  . IBS 02/23/2010  . HEMORRHOIDS, EXTERNAL 01/01/2010  . HEMATURIA UNSPECIFIED 11/04/2009  . NAUSEA 06/02/2009  . URINARY INCONTINENCE, STRESS, FEMALE 05/09/2009  . Acute sinusitis 11/25/2008  . UTI 04/23/2008  . COLONIC POLYPS 12/21/2007  . Bipolar disorder ([place]) 12/21/2007  . DEVIATED NASAL SEPTUM 12/21/2007  . HERNIA, VENTRAL 12/21/2007  . Osteoarthritis 12/21/2007  . Hypothyroidism 09/01/2007  . ECZEMA 09/01/2007  . Psoriasis 09/01/2007  . Osteoporosis 09/01/2007  . Insomnia 09/01/2007   Past Medical History:  Past Medical History:  Diagnosis Date  . Anemia    h/o fibroids  . Bipolar disorder ([place])   . Depression    bipolar  . Eczema   . Hypothyroidism   . Insomnia   . Osteoarthritis   . Osteoporosis   . Psoriasis    Past Surgical History:  Past Surgical History:  Procedure Laterality Date  . ABDOMINAL HYSTERECTOMY  1982  . APPENDECTOMY  2003  . BREAST LUMPECTOMY  1947   left  . CATARACT EXTRACTION  2010  . EYE SURGERY    . HERNIA REPAIR    . KNEE ARTHROSCOPY  09/07/11   right knee  . LOOP RECORDER INSERTION N/A 11/20/2018   Procedure: LOOP RECORDER INSERTION;  Surgeon: Evans Lance, MD;  Location: [facility];  Service: Cardiovascular;  Laterality: N/A;  . LOOP RECORDER INSERTION  11/20/2018   Procedure: LOOP RECORDER INSERTION;  Surgeon: Jerline Pain, MD;  Location: [facility];  Service: Cardiovascular;;  . NASAL SEPTUM SURGERY  1970's  . TEE WITHOUT CARDIOVERSION N/A 11/20/2018   Procedure: TRANSESOPHAGEAL ECHOCARDIOGRAM (TEE);  Surgeon: Jerline Pain, MD;  Location: [facility];  Service: Cardiovascular;  Laterality: N/A;  . TONSILLECTOMY  1970's  . TONSILLECTOMY    . TOTAL KNEE ARTHROPLASTY Right 03/04/2015   Procedure: RIGHT TOTAL KNEE ARTHROPLASTY;  Surgeon: Mcarthur Rossetti, MD;  Location: [facility];  Service: Orthopedics;  Laterality: Right;  . VENTRAL HERNIA REPAIR  2005   Social History:  reports that she quit smoking about 33 years ago. She has never used smokeless tobacco. She reports that she does not drink alcohol or use drugs.  Family / Support Systems Marital Status: Single Patient Roles: Parent, Other (Comment)(retired RN) Children: Suleyma Wafer - son - [phone]; Nallely Yost - dtr-in-law - [phone] Anticipated Caregiver: son, dtr-in-law, and hired assist as needed Ability/Limitations of Caregiver: son retired Building control surveyor Availability: 24/7(as needed - family and/or hired caregiver) Family Dynamics: close, supportive family  Social History Preferred language: English Religion: Christian Education: nursing school Read: Yes Write: Yes Employment Status: Retired Public relations account executive Issues: none reported Guardian/Conservator: N/A - MD has determined that  pt is capable of making her own decisions.  She has designated her son as her healthcare and financial POA, should needs arise.   Abuse/Neglect Abuse/Neglect Assessment Can Be Completed: Yes Physical Abuse: Denies Verbal Abuse: Denies Sexual Abuse: Denies Exploitation of patient/patient's resources:  Denies Self-Neglect: Denies  Emotional Status Pt's affect, behavior and adjustment status: Pt is motivated to get better and is encouraged by condition so far.  Pt's son thinks her mood is stable. Recent Psychosocial Issues: none reported Psychiatric History: Pt with a hx of bipolar disorder, but current medication regimen is working well, per son's report. Substance Abuse History: none reported  Patient / Family Perceptions, Expectations & Goals Pt/Family understanding of illness & functional limitations: Pt/son report as good understanding of pt's condition. Premorbid pt/family roles/activities: Pt went to water aerobics at the [facility] 3x/week and watched TV while laying in bed.  She also plays cards on thursday nights. Anticipated changes in roles/activities/participation: Pt would like to resume activities as she can. Pt/family expectations/goals: Pt wants to be able to manage at home with as much independence as possible.  Community [employer] Agencies: None Premorbid Home Care/DME Agencies: Other (Comment)(Pt had [facility] in the past when she had her knee replaced.  She has a rolling walker, bedside commode, and shower seat.) Transportation available at discharge: son Resource referrals recommended: Neuropsychology, Support group (specify)(stroke support group)  Discharge Planning Living Arrangements: Alone Support Systems: Children, Other relatives, Friends/neighbors Type of Residence: Private residence Insurance Resources: [employer], Multimedia programmer (specify)(federal Blue Cross [employer] for [employer]) Museum/gallery curator Resources: Radio broadcast assistant Screen Referred: No Money Management: Family(son) Does the patient have any problems obtaining your medications?: No Home Management: Pt and her family take care of this and she lives in a [place], so outside of home is taken care of. Patient/Family Preliminary Plans: Pt plans to return to her home with her  son and dtr-in-law to be with her initially 24/7.  Then, if pt still needs that level of care, family to look into ALF vs. paid private duty caregivers. Social Work Anticipated Follow Up Needs: HH/OP, Support Group Expected length of stay: 14 to 18 days  Clinical Impression CSW met with pt and her son 11-23-18 to introduce self and role of CSW as well as to complete assessment.  Pt and son are both grateful that pt could come to CIR to rehabilitate from the stroke.  Pt has also been to SNF [facility]) following her knee replacement.  Pt has a significant mental health hx with bipolar disorder.  Son is used to helping pt through these episodes, especially mania.  Pt is pretty well controlled on her current medication regimen.  He has financial and healthcare power of attorney and CSW has sent a copy of these documents to medical records to be scanned to her chart.  Plan is for pt to return to her home where her son and dtr-in-law will provide 24/7 supervision initially and then they will reassess pt's needs as she progresses and see what next step will be - ALF vs. paid caregivers.  Son to visit [facility], as they were looking into this even prior to stroke as many of her friends are there.  CSW will continue to follow and assist as needed.  Yulianna Folse, Silvestre Mesi 11/24/2018, 2:37 PM

## 2018-11-24 NOTE — Progress Notes (Signed)
Inpatient [facility] Statement of Services  Patient Name:  [name] [name]  Date:  11/24/2018  Welcome to the [facility].  Our goal is to provide you with an individualized program based on your diagnosis and situation, designed to meet your specific needs.  With this comprehensive rehabilitation program, you will be expected to participate in at least 3 hours of rehabilitation therapies Monday-Friday, with modified therapy programming on the weekends.  Your rehabilitation program will include the following services:  Physical Therapy (PT), Occupational Therapy (OT), Speech Therapy (ST), 24 hour per day rehabilitation nursing, Neuropsychology, Case Management (Social Worker), Rehabilitation Medicine, Nutrition Services and Pharmacy Services  Weekly team conferences will be held on Wednesdays to discuss your progress.  Your Social Worker will talk with you frequently to get your input and to update you on team discussions.  Team conferences with you and your family in attendance may also be held.  Expected length of stay:  14 to 18 days Overall anticipated outcome:  Supervision  Depending on your progress and recovery, your program may change. Your Social Worker will coordinate services and will keep you informed of any changes. Your Social Worker's name and contact numbers are listed  below.  The following services may also be recommended but are not provided by the [facility] will be made to provide these services after discharge if needed.  Arrangements include referral to agencies that provide these services.  Your insurance has been verified to be:  Medicare and [place] Your primary doctor is:  Dr. Penni Homans  Pertinent information will be shared with your doctor and your insurance  company.  Social Worker:  Alfonse Alpers, LCSW  [phone] or ([phone]  Information discussed with and copy given to patient by: Trey Sailors, 11/24/2018, 12:58 AM

## 2018-11-24 NOTE — Progress Notes (Signed)
Occupational Therapy Session Note  Patient Details  Name: [name] [name] MRN: [id number] Date of Birth: [birth year]  Today's Date: 11/24/2018 OT Individual Time: 1305-1400 OT Individual Time Calculation (min): 55 min    Short Term Goals: Week 1:  OT Short Term Goal 1 (Week 1): Pt will complete grooming task bimanually with CGA to demonstrate improved R UE coordination OT Short Term Goal 2 (Week 1): Pt will transfer to toilet with min A OT Short Term Goal 3 (Week 1): Pt will don pants with mod A OT Short Term Goal 4 (Week 1): Pt will feed self 1/2 of meal with R UE with 75% accuracy   Skilled Therapeutic Interventions/Progress Updates:    OT intervention with focus on RUE NMR, RUE forced use, RUE FMC, sitting balance, functional transfers, and activity tolerance to increase independence with BADLs.  Pt's son present and observed therapy.  Pt resting in w/c upon arrival and transitioned to gym.  Pt performs stand pivot tranfsfer with min A and mod verbal cues fore sequencing.  Pt requires max verbal cues for anterior weight shift while sitting in attempt to bring pelvis to neutral.  Pt engaged in [place] (see below) with improved RUE AROM and function. Pt requires max verbal cues for sequencing when picking up large pegs with RUE. Pt also participated in activity requiring holding large dowel between BUE outstretched with focus on muscle activation to maintain dowel between BUE.  Pt repeated activity with ball and tossed ball to therapist with chest pass.  Pt returned to w/c and remained in w/c with belt alamr activated, all needs within reach, and son present.   Therapy Documentation Precautions:  Precautions Precautions: Fall Precaution Comments: R hemi, R knee buckles Restrictions Weight Bearing Restrictions: No Pain:  Pt denies pain this afternoon    Other Treatments: Treatments Neuromuscular Facilitation: Right;Upper Extremity;Forced use;Activity to increase coordination;Activity to  increase motor control;Activity to increase timing and sequencing;Activity to increase grading;Activity to increase sustained activation;Activity to increase anterior-posterior weight shifting Weight Bearing Technique Weight Bearing Technique: Yes RUE Weight Bearing Technique: Extended arm seated Response to Weight Bearing Technique: increased shoulder flexion and elbow flexion/extension   Therapy/Group: Individual Therapy  Leroy Libman 11/24/2018, 2:44 PM

## 2018-11-24 NOTE — Plan of Care (Signed)
  Problem: Consults Goal: RH STROKE PATIENT EDUCATION Description See Patient Education module for education specifics  Outcome: Progressing   Problem: RH BOWEL ELIMINATION Goal: RH STG MANAGE BOWEL WITH ASSISTANCE Description STG Manage Bowel with [place].  Outcome: Progressing Goal: RH STG MANAGE BOWEL W/MEDICATION W/ASSISTANCE Description STG Manage Bowel with Medication with [place].  Outcome: Progressing   Problem: RH BLADDER ELIMINATION Goal: RH STG MANAGE BLADDER WITH ASSISTANCE Description STG Manage Bladder With Min Assistance  Outcome: Progressing   Problem: RH SKIN INTEGRITY Goal: RH STG MAINTAIN SKIN INTEGRITY WITH ASSISTANCE Description STG Maintain Skin Integrity With [place].  Outcome: Progressing   Problem: RH SAFETY Goal: RH STG ADHERE TO SAFETY PRECAUTIONS W/ASSISTANCE/DEVICE Description STG Adhere to Safety Precautions With Min Assistance/Device.  Outcome: Progressing   Problem: RH KNOWLEDGE DEFICIT Goal: RH STG INCREASE KNOWLEDGE OF HYPERTENSION Description Pt will be able to verbalize stated parameters set by physician for hypertension with min assist  Outcome: Progressing Goal: RH STG INCREASE KNOWLEDGE OF STROKE PROPHYLAXIS Outcome: Progressing

## 2018-11-24 NOTE — Telephone Encounter (Signed)
Manual transmission received and reviewed. 11 "AF" ECGs suggest SR w/PACs, some difficult to interpret due to baseline artifact. Will review with provider.

## 2018-11-24 NOTE — Progress Notes (Signed)
Physical Therapy Session Note  Patient Details  Name: [name] [name] MRN: [id number] Date of Birth: 03-Feb-1933  Today's Date: 11/24/2018 PT Individual Time: 1415-1526 PT Individual Time Calculation (min): 71 min   Short Term Goals: Week 1:  PT Short Term Goal 1 (Week 1): Pt will perform bed<>chair transfer min assist PT Short Term Goal 2 (Week 1): Pt will ambulate x 50 ft min assist with LRAD PT Short Term Goal 3 (Week 1): Pt will perform sit<>stand from w/c or mat with min assist  Skilled Therapeutic Interventions/Progress Updates:    Patient received in bathroom with nurse tech, PT took over toileting but patient unable to void, able to stand with MinA, required totalA to pull up brief/pants, and pivoted back to [facility] with MinA. Transported to PT gym totalA in [facility] and was able to gait train 65f then 255fin hallway with railing on L and MinA for R LE progression/reducing scissoring/quad activation. Then transferred to mat table with ModA and worked on multiple sit to stands as well as NMR activities including tapping to targets with R and tapping on top of box with R LE for improved R foot clearance, also worked on these activities with cues for reduced R knee hyperextension in stance when tapping with L LE. Then performed sit to supine with ModA and worked on functional strengthening and proprioception tasks for R LE in supine with multiple rest breaks due to fatigue. MinA to transfer back to sitting, MinA to stand and MInA to maintain balance for grasping/reaching activity with R UE however became fatigued and required MaxA for controlled lower back to mat table. She was then transferred back to her WC with [place] taken back to her room totalA in [facility] in chair with all needs met, visitors present, and alarm belt active.   Therapy Documentation Precautions:  Precautions Precautions: Fall Precaution Comments: R hemi, R knee buckles Restrictions Weight Bearing Restrictions: No General:   Pain: Pain Assessment Pain Scale: 0-10 Pain Score: 0-No pain Mobility:   Locomotion :    Trunk/Postural Assessment :    Balance:   Exercises:      Therapy/Group: Individual Therapy  KrDeniece ReeT, DPT, CBIS  Supplemental Physical Therapist [facility]  Pager [phone] [phone] 11/24/2018, 3:42 PM

## 2018-11-24 NOTE — Patient Care Conference (Signed)
Inpatient RehabilitationTeam Conference and Plan of Care Update Date: 11/22/2018   Time: 11:25 AM    Patient Name: [name] [name]      Medical Record Number: [id number]  Date of Birth: [birth year] Sex: Female         Room/Bed: 4W08C/4W08C-01 Payor Info: Payor: MEDICARE / Plan: MEDICARE PART A AND B / Product Type: *No Product type* /    Admitting Diagnosis: cva  Admit Date/Time:  11/20/2018  6:02 PM Admission Comments: No comment available   Primary Diagnosis:  <principal problem not specified> Principal Problem: <principal problem not specified>  Patient Active Problem List   Diagnosis Date Noted  . Left middle cerebral artery stroke ([place]) 11/20/2018  . Acute arterial ischemic stroke, multifocal, posterior circulation, left ([place]) 11/19/2018  . Stroke ([place]) 11/18/2018  . Stroke (cerebrum) ([place]) 11/18/2018  . Myalgia 08/06/2018  . Left lower quadrant pain 08/29/2017  . Dyspepsia 03/25/2017  . Atherosclerosis of aorta ([place]) 09/27/2016  . COPD with acute bronchitis ([place]) 09/27/2016  . Acute blood loss anemia 03/16/2015  . Osteoarthritis of right knee 03/04/2015  . Status post total right knee replacement 03/04/2015  . Contusion of knee, right 10/23/2014  . IBS 02/23/2010  . HEMORRHOIDS, EXTERNAL 01/01/2010  . HEMATURIA UNSPECIFIED 11/04/2009  . NAUSEA 06/02/2009  . URINARY INCONTINENCE, STRESS, FEMALE 05/09/2009  . Acute sinusitis 11/25/2008  . UTI 04/23/2008  . COLONIC POLYPS 12/21/2007  . Bipolar disorder ([place]) 12/21/2007  . DEVIATED NASAL SEPTUM 12/21/2007  . HERNIA, VENTRAL 12/21/2007  . Osteoarthritis 12/21/2007  . Hypothyroidism 09/01/2007  . ECZEMA 09/01/2007  . Psoriasis 09/01/2007  . Osteoporosis 09/01/2007  . Insomnia 09/01/2007    Expected Discharge Date: Expected Discharge Date: 12/08/18  Team Members Present: Physician leading conference: Dr. Alysia Penna Social Worker Present: Ovidio Kin, LCSW Nurse Present: Dorthula Nettles, RN PT Present: Michaelene Song, PT OT Present: Willeen Cass, OT;Roanna Epley, COTA SLP Present: Weston Anna, SLP PPS Coordinator present : Gunnar Fusi     Current Status/Progress Goal Weekly Team Focus  Medical   Patient sleeping better with Ambien, hypokalemia on supplementation.  Maintain medical stability reduce fall risk reduce readmission risk  Medication adjustments resumption of vitamin supplements.  Establishing rehab goals   Bowel/Bladder   Continent of B/B LBM-prior to admission, given sorbitol without effectiveness  Remain continent  Assist with toileting as needed   Swallow/Nutrition/ Hydration             ADL's   bathing-mod A; UB dressing-mod A; LB dressing-max A; functional transfers and standing balance-mod A; RUE ataxic and decreased strength proximal to distal  supervision overall  BADL retraining, functional transfers, standing balance, RUE NMR, safety awareness, education   Mobility   min assist bed mobility, mod assist sit<>stand, transfers and gait up to 10 ft.   supervision.  R NMR, gait, balance, strength   Communication             Safety/Cognition/ Behavioral Observations  min assist   supervision   follow up for 1-2 additional sessions for medication management and higher level problem solving and d/c as pt appears close to baseline   Pain   No complaints of pain.   Remain free of pain.   Assess pain every shift and as needed.   Skin   no skin issues  Maintain skin integrity  Assess skin every shift and as needed    Rehab Goals Patient on target to meet rehab goals: Yes Rehab Goals Revised: none *See  Care Plan and progress notes for long and short-term goals.     Barriers to Discharge  Current Status/Progress Possible Resolutions Date Resolved   Physician    Medical stability     Tolerating therapy  Continue rehabilitation program      Nursing                  PT  Decreased caregiver support                 OT                  SLP                SW                 Discharge Planning/Teaching Needs:  Pt to return to her home with her son, dtr-in-law, and paid caregiver (if needed).  Family to come for family education closer to d/c.   Team Discussion:  Pt is currently mod A for ADLs and transfers and needs to work on right UE coordination.  Pt is mod A for 10' gait; her knee buckles at times.  Pt's goals are set for supervision.  Pt with a mental health hx, well controlled by medication.  ST to see pt 1-2 more sessions and will then d/c as pt is close to baseline.  Pt is back on ambien and reported sleeping better.   Revisions to Treatment Plan:  none    Continued Need for Acute Rehabilitation Level of Care: The patient requires daily medical management by a physician with specialized training in physical medicine and rehabilitation for the following conditions: Daily direction of a multidisciplinary physical rehabilitation program to ensure safe treatment while eliciting the highest outcome that is of practical value to the patient.: Yes Daily medical management of patient stability for increased activity during participation in an intensive rehabilitation regime.: Yes Daily analysis of laboratory values and/or radiology reports with any subsequent need for medication adjustment of medical intervention for : Neurological problems   I attest that I was present, lead the team conference, and concur with the assessment and plan of the team.   Kathrene Sinopoli, Silvestre Mesi 11/24/2018, 12:45 AM

## 2018-11-25 ENCOUNTER — Inpatient Hospital Stay (HOSPITAL_COMMUNITY): Payer: Medicare Other | Admitting: Occupational Therapy

## 2018-11-25 ENCOUNTER — Inpatient Hospital Stay (HOSPITAL_COMMUNITY): Payer: Medicare Other | Admitting: Physical Therapy

## 2018-11-25 DIAGNOSIS — R609 Edema, unspecified: Secondary | ICD-10-CM

## 2018-11-25 DIAGNOSIS — F5101 Primary insomnia: Secondary | ICD-10-CM

## 2018-11-25 DIAGNOSIS — G8191 Hemiplegia, unspecified affecting right dominant side: Secondary | ICD-10-CM

## 2018-11-25 DIAGNOSIS — R600 Localized edema: Secondary | ICD-10-CM

## 2018-11-25 MED ORDER — FUROSEMIDE 20 MG PO TABS
20.0000 mg | ORAL_TABLET | Freq: Every day | ORAL | Status: DC
  Administered 2018-11-25 – 2018-12-06 (×12): 20 mg via ORAL
  Filled 2018-11-25 (×12): qty 1

## 2018-11-25 NOTE — Progress Notes (Addendum)
[place] PHYSICAL MEDICINE & REHABILITATION PROGRESS NOTE   Subjective/Complaints: Patient seen laying in bed this morning.  She states she slept well overnight.  She remembers me.  ROS-denies CP, SOB, N/V/D  Objective:   No results found. No results for input(s): WBC, HGB, HCT, PLT in the last 72 hours. Recent Labs    11/24/18 0608  NA 140  K 4.0  CL 105  CO2 23  GLUCOSE 104*  BUN 15  CREATININE 0.94  CALCIUM 9.2    Intake/Output Summary (Last 24 hours) at 11/25/2018 0830 Last data filed at 11/24/2018 1746 Gross per 24 hour  Intake 780 ml  Output -  Net 780 ml     Physical Exam: Vital Signs Blood pressure 128/81, pulse 75, temperature 98.3 F (36.8 C), temperature source Oral, resp. rate 17, height 5\' 5"  (1.651 m), weight 68.2 kg, SpO2 100 %. Constitutional: No distress . Vital signs reviewed. HENT: Normocephalic.  Atraumatic.  Poor dentition. Eyes: EOMI. No discharge. Cardiovascular: RRR.  No JVD. Respiratory: CTA bilaterally.  Normal effort. GI: BS +. Non-distended. Musc: Lower extremity edema,?  Increasing Neurologic: Alert and oriented Motor: 5/5 LUE proximal to distal 4+-5/5 LLE proximal to distal 4-4+/5 RUE proximal to distal 4-4+/5 RLE proximal and distal Skin: No evidence of breakdown, no evidence of rash Psych: Normal mood.  Normal affect.  Assessment/Plan: 1. Functional deficits secondary to Left corona radiata and posterior MCA infarcts onset 11/18/18 which require 3+ hours per day of interdisciplinary therapy in a comprehensive inpatient rehab setting.  Physiatrist is providing close team supervision and 24 hour management of active medical problems listed below.  Physiatrist and rehab team continue to assess barriers to discharge/monitor patient progress toward functional and medical goals  Care Tool:  Bathing    Body parts bathed by patient: Right arm, Left arm, Chest, Abdomen, Front perineal area, Face, Right upper leg, Left upper leg   Body parts bathed by helper: Buttocks, Right lower leg, Left lower leg     Bathing assist Assist Level: Moderate Assistance - Patient 50 - 74%     Upper Body Dressing/Undressing Upper body dressing   What is the patient wearing?: Pull over shirt    Upper body assist Assist Level: Minimal Assistance - Patient > 75%    Lower Body Dressing/Undressing Lower body dressing      What is the patient wearing?: Underwear/pull up     Lower body assist Assist for lower body dressing: Moderate Assistance - Patient 50 - 74%     Toileting Toileting    Toileting assist Assist for toileting: Moderate Assistance - Patient 50 - 74%     Transfers Chair/bed transfer  Transfers assist     Chair/bed transfer assist level: Moderate Assistance - Patient 50 - 74%     Locomotion Ambulation   Ambulation assist      Assist level: Moderate Assistance - Patient 50 - 74% Assistive device: Other (comment)(rail in hallway ) Max distance: 69ft   Walk 10 feet activity   Assist     Assist level: Moderate Assistance - Patient - 50 - 74% Assistive device: Other (comment)(rail in hallway )   Walk 50 feet activity   Assist Walk 50 feet with 2 turns activity did not occur: Safety/medical concerns(R hemiparesis, buckling)         Walk 150 feet activity   Assist Walk 150 feet activity did not occur: Safety/medical concerns         Walk 10 feet on uneven surface  activity   Assist Walk 10 feet on uneven surfaces activity did not occur: Safety/medical concerns         Wheelchair     Assist Will patient use wheelchair at discharge?: Yes Type of Wheelchair: Manual    Wheelchair assist level: Moderate Assistance - Patient 50 - 74% Max wheelchair distance: 150'    Wheelchair 50 feet with 2 turns activity    Assist        Assist Level: Moderate Assistance - Patient 50 - 74%   Wheelchair 150 feet activity     Assist Wheelchair 150 feet activity did not  occur: Safety/medical concerns   Assist Level: Moderate Assistance - Patient 50 - 74%      Medical Problem List and Plan: 1.Right side weaknesssecondary to embolicleftterritory MCA infarction.Status post loop recorder Continue CIR  ASA and plavix for 3 weeks then ASA 81mg  alone- on 12/09/2018 2. DVT Prophylaxis/Anticoagulation: subcutaneous Lovenox. Monitor for any bleeding episodes 3. Pain Management:Tylenol as needed 4. Mood:Abilify 5 mg daily, Bipolar disorder 5. Neuropsych: This patientiscapable of making decisions on herown behalf. 6. Skin/Wound Care:Routine skin checks 7. Fluids/Electrolytes/Nutrition:Routine in and out's   BMP within acceptable range on 2/7 8. Hypothyroidism. Synthroid 9. Hyperlipidemia. Lipitor 10.  Chronic insomnia: Resumed Ambien but at 5mg  11.  Hypokalemia:   Potassium 4.0 on 2/7  Labs ordered for Monday  Continue KCl 66meq BID 12.  Labile blood pressure  Improving, within normal limits this morning  Monitor for trend 13.  Peripheral edema  Lasix 20 started on 2/8  LOS: 5 days A FACE TO FACE EVALUATION WAS PERFORMED  Tekeyah Santiago Lorie Phenix 11/25/2018, 8:30 AM

## 2018-11-25 NOTE — Progress Notes (Signed)
Physical Therapy Session Note  Patient Details  Name: [name] [name] MRN: [id number] Date of Birth: 1933-01-04  Today's Date: 11/25/2018 PT Individual Time: 1300-1400 PT Individual Time Calculation (min): 60 min   Short Term Goals: Week 1:  PT Short Term Goal 1 (Week 1): Pt will perform bed<>chair transfer min assist PT Short Term Goal 2 (Week 1): Pt will ambulate x 50 ft min assist with LRAD PT Short Term Goal 3 (Week 1): Pt will perform sit<>stand from w/c or mat with min assist  Skilled Therapeutic Interventions/Progress Updates:    Pt received seated in w/c in room, agreeable to PT session. No complaints of pain. Dependent transport via w/c to therapy gym for time management. Sit to stand with min A to rail in hallway. Ambulation x 15 ft with L handrail and mod A for balance, multimodal cueing for R knee control in stance, v/c for upright posture, manual assist for lateral weight shifting. Pt has onset of RLE cramping during gait. Squat pivot transfer w/c to mat table with min A. Sit to/from supine with CGA. Supine BLE stretches x 10 reps to relieve cramping. Supine bridges 3 x 10 reps. Squat pivot transfer back to w/c with min A. Standing at L rail in hallway with min to mod A, use of mirror for visual feedback, focus on weight shifting L/R and then alt L/R 1" step-taps with multimodal cueing to activate R knee extensors. Toilet transfer with min A, dependent for clothing management and setup A for pericare. Pt left seated in w/c in room with needs in reach, quick release belt and chair alarm in place.  Therapy Documentation Precautions:  Precautions Precautions: Fall Precaution Comments: R hemi, R knee buckles Restrictions Weight Bearing Restrictions: No    Therapy/Group: Individual Therapy   Excell Seltzer, PT, DPT  11/25/2018, 4:34 PM

## 2018-11-25 NOTE — Progress Notes (Signed)
Occupational Therapy Session Note  Patient Details  Name: [name] [name] MRN: [id number] Date of Birth: 11/02/1932  Today's Date: 11/25/2018 OT Individual Time: 5361-4431 and 1420-1530 OT Individual Time Calculation (min): 62 min and 70 min  Short Term Goals: Week 1:  OT Short Term Goal 1 (Week 1): Pt will complete grooming task bimanually with CGA to demonstrate improved R UE coordination OT Short Term Goal 2 (Week 1): Pt will transfer to toilet with min A OT Short Term Goal 3 (Week 1): Pt will don pants with mod A OT Short Term Goal 4 (Week 1): Pt will feed self 1/2 of meal with R UE with 75% accuracy   Skilled Therapeutic Interventions/Progress Updates:    Pt greeted in bed finishing up breakfast. Using Rt hand as gross stabilizer because per pt, "it's useless." Educated pt to manage beverages using B UEs as well. Afterwards, pt completed supine<sit with steady assist, HOB flat with use of bedrails. Stand pivot<w/c completed with Mod A towards Rt side via stand pivot. Pt with popping sound in Rt knee and buckling during transfer. She completed oral care and facial cream application w/c level with vcs for increasing functional use of Rt. Pt utilized active assist techniques for applying cream to face. Vcs required for rubbing in cream fully on Rt side of face. Afterwards placed pt beside dresser and had her open drawers/reach for clothing using Rt hand. She was able to do so without assist from Lt hand, though required cues to not involve Lt. She dressed sit<stand at sink after with Mod A for sit<stands and standing balance. Pt required vcs for motor planning and hemi techniques when donning overhead shirt and pants. Pt able to utilize figure 4 position with R LE with cuing alone. She threaded both legs into one pant hole and recognized error with questioning cues. When she donned shirt, the fabric was halfway pulled down in back and she looked at OT, stating: "what next?" She completed these tasks  with supervision. Max A for donning footwear due to time constraints. OT donned Teds. At end of session pt was left in w/c with all needs and safety belt fastened. Tx focus today placed on praxis, Rt NMR, problem solving, and awareness during functional tasks.   2nd Session 1:1 tx (70 min) Pt greeted in w/c. Declined showering or toileting, so escorted pt via w/c to atrium. For duration of session, worked on [employer], motor planning, and bilateral coordination during meaningful IADL shopping engagement. She self propelled in w/c throughout gift shop, vcs required for avoiding obstacles on Rt side and for w/c navigation techniques in tight spaces, especially when correcting Rt veering. Demonstrational, tactile, and verbal cues required for large vs small w/c pushes. Rt NMR and sensation normalization while pt reached for lotion, opened small candles, stroked large stuffed animals, and touched meditation rocks/jewelry. Able to use Rt to rotate the jewelry display case as well. Min A for pressing buttons on animated stuffed animals to observe them singing and dancing. Pts affect visibly brightened at this time, stating: "let's do that again" and she listened to the same animal 3 times (a bird singing "girls just want to have fun"). Pt also stood in [place] at the dessert display case for 2 minutes using RW with Mod A sit<stand, Min A for balance and tactile cues for keeping Rt knee extended. There was a noticeable popping sound in Rt knee when she sat back down. Pt was then escorted back to unit via  w/c and left with visitors present.      Therapy Documentation Precautions:  Precautions Precautions: Fall Precaution Comments: R hemi, R knee buckles Restrictions Weight Bearing Restrictions: No Pain: No c/o pain during either session    ADL: ADL Eating: Minimal assistance Grooming: Minimal assistance Where Assessed-Grooming: Wheelchair Upper Body Bathing: Minimal cueing, Minimal assistance Where  Assessed-Upper Body Bathing: Shower Lower Body Bathing: Moderate assistance, Minimal cueing Where Assessed-Lower Body Bathing: Shower Where Assessed-Upper Body Dressing: Wheelchair Lower Body Dressing: Maximal assistance, Minimal cueing Where Assessed-Lower Body Dressing: Wheelchair Toileting: Moderate assistance Where Assessed-Toileting: Glass blower/designer: Moderate assistance, Moderate verbal cueing Toilet Transfer Method: Stand pivot Toilet Transfer Equipment: Energy manager: Moderate assistance, Minimal cueing Social research officer, government Method: Radiographer, therapeutic: Shower seat with back, Grab bars      Therapy/Group: Individual Therapy  Delfin Squillace A Edmond Ginsberg 11/25/2018, 12:19 PM

## 2018-11-26 ENCOUNTER — Inpatient Hospital Stay (HOSPITAL_COMMUNITY): Payer: Medicare Other | Admitting: Physical Therapy

## 2018-11-26 ENCOUNTER — Inpatient Hospital Stay (HOSPITAL_COMMUNITY): Payer: Medicare Other

## 2018-11-26 NOTE — Progress Notes (Signed)
Occupational Therapy Session Note  Patient Details  Name: [name] [name] MRN: [id number] Date of Birth: 1933/06/24  Today's Date: 11/26/2018 OT Individual Time: 0715-0800 OT Individual Time Calculation (min): 45 min    Short Term Goals: Week 1:  OT Short Term Goal 1 (Week 1): Pt will complete grooming task bimanually with CGA to demonstrate improved R UE coordination OT Short Term Goal 2 (Week 1): Pt will transfer to toilet with min A OT Short Term Goal 3 (Week 1): Pt will don pants with mod A OT Short Term Goal 4 (Week 1): Pt will feed self 1/2 of meal with R UE with 75% accuracy   Skilled Therapeutic Interventions/Progress Updates:    Pt received supine ready for breakfast with no c/o pain. Pt's bed was put in trend position and pt instructed in UE placement to pull self up to facilitate optimal eating position supine to reduce aspiration risk. Pt's breakfast tray was set up with 1/2 containers opened, and pt able to open easier paper containers. Pt used L hand to feed self majority of meal with built up handle on spoon. Pt encouraged to use R UE for several bites with edu provided on importance/benefit of practice during ADLs. Manual facilitation provided to position utensil in R UE, and pt able to bring utensil to mouth with decreased speed/accuracy but no spillage overall. Pt transitioned to EOB with (S) and doffed shirt with vc for technique, no physical assist. Mod A provided to don pants EOB, anticipate pt could have done more independently but more assistance provided d/t time constraints. Pt passed off to PT in room sitting EOB.   Therapy Documentation Precautions:  Precautions Precautions: Fall Precaution Comments: R hemi, R knee buckles Restrictions Weight Bearing Restrictions: No Vital Signs: Therapy Vitals Temp: 98.2 F (36.8 C) Temp Source: Oral Pulse Rate: 96(recheck pulse after pt voided in bathroom) Resp: 17 BP: 123/85 Patient Position (if appropriate): Lying Oxygen  Therapy SpO2: 96 % O2 Device: Room Air Pain: Pain Assessment Pain Scale: 0-10 Pain Score: 0-No pain  Therapy/Group: Individual Therapy  Curtis Sites 11/26/2018, 7:33 AM

## 2018-11-26 NOTE — Progress Notes (Signed)
Physical Therapy Session Note  Patient Details  Name: [name] [name] MRN: [id number] Date of Birth: Jul 15, 1933  Today's Date: 11/26/2018 PT Individual Time: 0800-0858 PT Individual Time Calculation (min): 58 min   Short Term Goals: Week 1:  PT Short Term Goal 1 (Week 1): Pt will perform bed<>chair transfer min assist PT Short Term Goal 2 (Week 1): Pt will ambulate x 50 ft min assist with LRAD PT Short Term Goal 3 (Week 1): Pt will perform sit<>stand from w/c or mat with min assist  Skilled Therapeutic Interventions/Progress Updates:  Pt was seen bedside in the am. Pt sitting on edge of bed with OT. Pt transferred edge of bed to w/c with rolling walker and min A with verbal cues. Pt transported to rehab gym. In gym treatment focused on LE exercises, hip flex and LAQs 2 sets x 10 reps each. Treatment focused on NMR utilizing step taps 3 sets x 10 reps each and standing in parallel bars focusing on pre gait activities and weight shifting. Pt returned to room and left sitting up in w/c with chair alarm on and call bell within reach.   Therapy Documentation Precautions:  Precautions Precautions: Fall Precaution Comments: R hemi, R knee buckles Restrictions Weight Bearing Restrictions: No General:   Pain: No c/o pain.  Therapy/Group: Individual Therapy  Dub Amis 11/26/2018, 12:08 PM

## 2018-11-26 NOTE — Progress Notes (Signed)
[name] PHYSICAL MEDICINE & REHABILITATION PROGRESS NOTE   Subjective/Complaints: Patient seen laying in bed this morning.  She states she slept well overnight.  She denies complaints.  ROS-denies CP, SOB, N/V/D  Objective:   No results found. No results for input(s): WBC, HGB, HCT, PLT in the last 72 hours. Recent Labs    11/24/18 0608  NA 140  K 4.0  CL 105  CO2 23  GLUCOSE 104*  BUN 15  CREATININE 0.94  CALCIUM 9.2    Intake/Output Summary (Last 24 hours) at 11/26/2018 2104 Last data filed at 11/26/2018 1804 Gross per 24 hour  Intake 480 ml  Output -  Net 480 ml     Physical Exam: Vital Signs Blood pressure (!) 127/56, pulse 83, temperature 98.6 F (37 C), temperature source Oral, resp. rate (!) 9, height 5\' 5"  (1.651 m), weight 68.2 kg, SpO2 95 %. Constitutional: No distress . Vital signs reviewed. HENT: Normocephalic.  Atraumatic.  Poor dentition. Eyes: EOMI. No discharge. Cardiovascular: RRR.  No JVD. Respiratory: CTA bilaterally.  Normal effort. GI: BS +. Non-distended. Musc: Lower extremity edema Neurologic: Alert and oriented Motor: 5/5 LUE proximal to distal 4+-5/5 LLE proximal to distal, stable 4-4+/5 RUE proximal to distal 4-4+/5 RLE proximal and distal, stable Skin: No evidence of breakdown, no evidence of rash Psych: Normal mood.  Normal affect.  Assessment/Plan: 1. Functional deficits secondary to Left corona radiata and posterior MCA infarcts onset 11/18/18 which require 3+ hours per day of interdisciplinary therapy in a comprehensive inpatient rehab setting.  Physiatrist is providing close team supervision and 24 hour management of active medical problems listed below.  Physiatrist and rehab team continue to assess barriers to discharge/monitor patient progress toward functional and medical goals  Care Tool:  Bathing    Body parts bathed by patient: Right arm, Left arm, Chest, Abdomen   Body parts bathed by helper: Buttocks, Right lower  leg, Left lower leg     Bathing assist Assist Level: Set up assist     Upper Body Dressing/Undressing Upper body dressing   What is the patient wearing?: Pull over shirt    Upper body assist Assist Level: Supervision/Verbal cueing    Lower Body Dressing/Undressing Lower body dressing      What is the patient wearing?: Pants     Lower body assist Assist for lower body dressing: Moderate Assistance - Patient 50 - 74%     Toileting Toileting    Toileting assist Assist for toileting: Moderate Assistance - Patient 50 - 74%     Transfers Chair/bed transfer  Transfers assist     Chair/bed transfer assist level: Minimal Assistance - Patient > 75%     Locomotion Ambulation   Ambulation assist      Assist level: Moderate Assistance - Patient 50 - 74% Assistive device: Other (comment)(rail in hallway) Max distance: 15'   Walk 10 feet activity   Assist     Assist level: Moderate Assistance - Patient - 50 - 74% Assistive device: Other (comment)(rail in hallway)   Walk 50 feet activity   Assist Walk 50 feet with 2 turns activity did not occur: Safety/medical concerns(R hemiparesis, buckling)         Walk 150 feet activity   Assist Walk 150 feet activity did not occur: Safety/medical concerns         Walk 10 feet on uneven surface  activity   Assist Walk 10 feet on uneven surfaces activity did not occur: Safety/medical concerns  Wheelchair     Assist Will patient use wheelchair at discharge?: Yes Type of Wheelchair: Manual    Wheelchair assist level: Moderate Assistance - Patient 50 - 74% Max wheelchair distance: 150'    Wheelchair 50 feet with 2 turns activity    Assist        Assist Level: Moderate Assistance - Patient 50 - 74%   Wheelchair 150 feet activity     Assist Wheelchair 150 feet activity did not occur: Safety/medical concerns   Assist Level: Moderate Assistance - Patient 50 - 74%       Medical Problem List and Plan: 1.Right side weaknesssecondary to embolicleftterritory MCA infarction.Status post loop recorder Continue CIR  ASA and plavix for 3 weeks then ASA 81mg  alone- on 12/09/2018 2. DVT Prophylaxis/Anticoagulation: subcutaneous Lovenox. Monitor for any bleeding episodes 3. Pain Management:Tylenol as needed 4. Mood:Abilify 5 mg daily, Bipolar disorder 5. Neuropsych: This patientiscapable of making decisions on herown behalf. 6. Skin/Wound Care:Routine skin checks 7. Fluids/Electrolytes/Nutrition:Routine in and out's   BMP within acceptable range on 2/7 8. Hypothyroidism. Synthroid 9. Hyperlipidemia. Lipitor 10.  Chronic insomnia: Resumed Ambien but at 5mg  11.  Hypokalemia:   Potassium 4.0 on 2/7  Labs ordered for tomorrow  Continue KCl 50meq BID 12.  Labile blood pressure  Labile on 2/9  Monitor for trend 13.  Peripheral edema  Lasix 20 started on 2/8  LOS: 6 days A FACE TO FACE EVALUATION WAS PERFORMED  [name] [name] [name] [name] 11/26/2018, 9:04 PM

## 2018-11-27 ENCOUNTER — Telehealth: Payer: Self-pay | Admitting: Cardiology

## 2018-11-27 ENCOUNTER — Inpatient Hospital Stay (HOSPITAL_COMMUNITY): Payer: Medicare Other | Admitting: Occupational Therapy

## 2018-11-27 ENCOUNTER — Inpatient Hospital Stay (HOSPITAL_COMMUNITY): Payer: Medicare Other

## 2018-11-27 LAB — BASIC METABOLIC PANEL
Anion gap: 10 (ref 5–15)
BUN: 20 mg/dL (ref 8–23)
CO2: 22 mmol/L (ref 22–32)
Calcium: 9.1 mg/dL (ref 8.9–10.3)
Chloride: 108 mmol/L (ref 98–111)
Creatinine, Ser: 1.07 mg/dL — ABNORMAL HIGH (ref 0.44–1.00)
GFR calc Af Amer: 56 mL/min — ABNORMAL LOW (ref >60)
GFR calc non Af Amer: 48 mL/min — ABNORMAL LOW (ref >60)
Glucose, Bld: 151 mg/dL — ABNORMAL HIGH (ref 70–99)
Potassium: 3.7 mmol/L (ref 3.5–5.1)
Sodium: 140 mmol/L (ref 135–145)

## 2018-11-27 LAB — CREATININE, SERUM
Creatinine, Ser: 0.95 mg/dL (ref 0.44–1.00)
GFR calc Af Amer: >60 mL/min (ref >60)
GFR calc non Af Amer: 55 mL/min — ABNORMAL LOW (ref >60)

## 2018-11-27 NOTE — Progress Notes (Addendum)
[place] PHYSICAL MEDICINE & REHABILITATION PROGRESS NOTE   Subjective/Complaints: Patient seen laying in bed this morning.  She states he slept well overnight.  She requests a grounds pass to be able to go to [place] with her son.  ROS-denies CP, SOB, N/V/D  Objective:   No results found. No results for input(s): WBC, HGB, HCT, PLT in the last 72 hours. Recent Labs    11/27/18 0526  CREATININE 0.95    Intake/Output Summary (Last 24 hours) at 11/27/2018 0856 Last data filed at 11/26/2018 1804 Gross per 24 hour  Intake 480 ml  Output -  Net 480 ml     Physical Exam: Vital Signs Blood pressure (!) 155/89, pulse 90, temperature 99 F (37.2 C), temperature source Oral, resp. rate 16, height 5\' 5"  (1.651 m), weight 68.2 kg, SpO2 94 %. Constitutional: No distress . Vital signs reviewed. HENT: Normocephalic.  Atraumatic.  Poor dentition. Eyes: EOMI. No discharge. Cardiovascular: RRR.  No JVD. Respiratory: CTA bilaterally.  Normal effort. GI: BS +. Non-distended. Musc: Lower extremity edema Neurologic: Alert and oriented Motor: 5/5 LUE proximal to distal 4+-5/5 LLE proximal to distal, unchanged 4-4+/5 RUE proximal to distal 4-4+/5 RLE proximal and distal, unchanged Skin: No evidence of breakdown, no evidence of rash Psych: Normal mood.  Normal affect.  Assessment/Plan: 1. Functional deficits secondary to Left corona radiata and posterior MCA infarcts onset 11/18/18 which require 3+ hours per day of interdisciplinary therapy in a comprehensive inpatient rehab setting.  Physiatrist is providing close team supervision and 24 hour management of active medical problems listed below.  Physiatrist and rehab team continue to assess barriers to discharge/monitor patient progress toward functional and medical goals  Care Tool:  Bathing    Body parts bathed by patient: Right arm, Left arm, Chest, Abdomen   Body parts bathed by helper: Buttocks, Right lower leg, Left lower leg      Bathing assist Assist Level: Set up assist     Upper Body Dressing/Undressing Upper body dressing   What is the patient wearing?: Pull over shirt    Upper body assist Assist Level: Supervision/Verbal cueing    Lower Body Dressing/Undressing Lower body dressing      What is the patient wearing?: Pants     Lower body assist Assist for lower body dressing: Moderate Assistance - Patient 50 - 74%     Toileting Toileting    Toileting assist Assist for toileting: Moderate Assistance - Patient 50 - 74%     Transfers Chair/bed transfer  Transfers assist     Chair/bed transfer assist level: Minimal Assistance - Patient > 75%     Locomotion Ambulation   Ambulation assist      Assist level: Moderate Assistance - Patient 50 - 74% Assistive device: Other (comment)(rail in hallway) Max distance: 15'   Walk 10 feet activity   Assist     Assist level: Moderate Assistance - Patient - 50 - 74% Assistive device: Other (comment)(rail in hallway)   Walk 50 feet activity   Assist Walk 50 feet with 2 turns activity did not occur: Safety/medical concerns(R hemiparesis, buckling)         Walk 150 feet activity   Assist Walk 150 feet activity did not occur: Safety/medical concerns         Walk 10 feet on uneven surface  activity   Assist Walk 10 feet on uneven surfaces activity did not occur: Safety/medical concerns         Wheelchair  Assist Will patient use wheelchair at discharge?: Yes Type of Wheelchair: Manual    Wheelchair assist level: Moderate Assistance - Patient 50 - 74% Max wheelchair distance: 150'    Wheelchair 50 feet with 2 turns activity    Assist        Assist Level: Moderate Assistance - Patient 50 - 74%   Wheelchair 150 feet activity     Assist Wheelchair 150 feet activity did not occur: Safety/medical concerns   Assist Level: Moderate Assistance - Patient 50 - 74%      Medical Problem List and  Plan: 1.Right side weaknesssecondary to embolicleftterritory MCA infarction.Status post loop recorder Continue CIR  ASA and plavix for 3 weeks then ASA 81mg  alone- on 12/09/2018 2. DVT Prophylaxis/Anticoagulation: subcutaneous Lovenox. Monitor for any bleeding episodes 3. Pain Management:Tylenol as needed 4. Mood:Abilify 5 mg daily, Bipolar disorder  Grounds pass ordered on 2/10 5. Neuropsych: This patientiscapable of making decisions on herown behalf. 6. Skin/Wound Care:Routine skin checks 7. Fluids/Electrolytes/Nutrition:Routine in and out's   BMP within acceptable range on 2/7, creatinine within normal less than 2/10 8. Hypothyroidism. Synthroid 9. Hyperlipidemia. Lipitor 10.  Chronic insomnia: Resumed Ambien but at 5mg  11.  Hypokalemia:   Potassium 4.0 on 2/7  Labs pending  Continue KCl 59meq BID 12.  Labile blood pressure  Labile on 2/10  Monitor for trend 13.  Peripheral edema  Lasix 20 started on 2/8  LOS: 7 days A FACE TO FACE EVALUATION WAS PERFORMED  Dakin Madani Lorie Phenix 11/27/2018, 8:56 AM

## 2018-11-27 NOTE — Telephone Encounter (Signed)
Spoke w/ pt RN on 4W and requested that patient send a manual transmission w/ her home monitor. Nurse is aware to call the office if she has any issues sending the transmission.

## 2018-11-27 NOTE — Progress Notes (Signed)
Occupational Therapy Session Note  Patient Details  Name: [name] [name] MRN: [id number] Date of Birth: 1933-04-12  Today's Date: 11/27/2018 OT Individual Time: 1030-1130 OT Individual Time Calculation (min): 60 min    Short Term Goals: Week 1:  OT Short Term Goal 1 (Week 1): Pt will complete grooming task bimanually with CGA to demonstrate improved R UE coordination OT Short Term Goal 2 (Week 1): Pt will transfer to toilet with min A OT Short Term Goal 3 (Week 1): Pt will don pants with mod A OT Short Term Goal 4 (Week 1): Pt will feed self 1/2 of meal with R UE with 75% accuracy      Skilled Therapeutic Interventions/Progress Updates:    Pt received in w/c declining a bath or shower due to her dry skin. She did want to brush her teeth.  Pt was able to use R hand actively to open toothpaste and apply to brush. Excellent attempts at using R hand to brush teeth, but she did have difficulty manipulating the brush and would need to switch to her L hand.  Cues for adjusting the water.  Pt applied face lotion and was able to do so with R hand which was difficult for her to do last week.  Pt's son arrived to observe therapy and had questions about if she would be able to walk when she gets home.  Informed him PT is working on this, but she will likely need quite a bit of help for some time due to R leg weakness.    Pt worked on sit to stand skills numerous times focusing on hand placement from w/c to walker and forward wt shift as she rose to standing. Pt was able to accomplish at least 4 x with only close S to light CGA.  In standing, alternating arm reaches overhead and R knee extension by contracting her quads.    In sitting, R sh AROM with reaching for cones and bimanual coordination of fastening lego blocks together.  Worked on in hand manipulation with rotating the blocks with min A to mobilize thumb/ finger prehension.    Pt's lunch arrived and son assisted pt setting up lunch.  Therapy  Documentation Precautions:  Precautions Precautions: Fall Precaution Comments: R hemi, R knee buckles Restrictions Weight Bearing Restrictions: No  Pain: Pain Assessment Pain Scale: 0-10 Pain Score: 0-No pain ADL:   Therapy/Group: Individual Therapy  [place] 11/27/2018, 12:31 PM

## 2018-11-27 NOTE — Progress Notes (Signed)
Occupational Therapy Session Note  Patient Details  Name: [name] [name] MRN: [id number] Date of Birth: Mar 13, 1933  Today's Date: 11/27/2018 OT Individual Time: 1400-1500 OT Individual Time Calculation (min): 60 min    Short Term Goals: Week 1:  OT Short Term Goal 1 (Week 1): Pt will complete grooming task bimanually with CGA to demonstrate improved R UE coordination OT Short Term Goal 2 (Week 1): Pt will transfer to toilet with min A OT Short Term Goal 3 (Week 1): Pt will don pants with mod A OT Short Term Goal 4 (Week 1): Pt will feed self 1/2 of meal with R UE with 75% accuracy   Skilled Therapeutic Interventions/Progress Updates:    Pt seen for OT session focusing on ADL re-training and R neuro re-ed. Pt sitting up in w/c upon arrival, agreeable to tx session and denying pain. Taken to therapy gym total A in w/c for time and energy conservation.  Seated in w/c, discussed pt's abilities to don/dof shoes. VCs for problem solving how to doff L shoe, able to complete following cues only. With L LE cross in figure four position, pt demonstrated ability to tie shoe with considerably increased time, loose tie that would not be functional. Provided pt with shoe button, education and demonstration provided use and pt able to return demonstrate ability to fasten and unfasten shoe button on R and L shoe. She was able to recall technique and re-demonstrate ability at end of session. Completed fine motor activity from seated position addressing in-hand manipulation skills, picking up small animal figurines and turning in hand to place on small target. VCs for effective/efieicent movements to prevent compensatory strategies. Completed with increased time and rest breaks provided throughout.  Completed sit>stand with min A at RW, assist for managing R hand in splint. Practiced removing clothes pins from around pants waist band using L UE in prep for LB dressing/toileting task. Pt able to remove clothes pins  from R and L side with CGA, mod VCs for R LE activation. Completed x3 trials with seated rest break btwn trials. Pt returned to room at end of session, agreeable to stay sitting up in w/c. Left with all needs in reach and chair belt alarm on.   Therapy Documentation Precautions:  Precautions Precautions: Fall Precaution Comments: R hemi, R knee buckles Restrictions Weight Bearing Restrictions: No Pain:   No/denies pain   Therapy/Group: Individual Therapy  Mykah Bellomo L 11/27/2018, 7:20 AM

## 2018-11-27 NOTE — Progress Notes (Signed)
Orthopedic Tech Progress Note Patient Details:  SUZIE VANDAM Dec 28, 1932 [id number] Called in brace order Patient ID: Miles Costain, female   DOB: [birth year], 85 y.o.   MRN: [id number]   Janit Pagan 11/27/2018, 3:25 PM

## 2018-11-27 NOTE — Progress Notes (Signed)
Physical Therapy Session Note  Patient Details  Name: [name] [name] MRN: [id number] Date of Birth: 11-28-1932  Today's Date: 11/27/2018 PT Individual Time: 0802-0915 PT Individual Time Calculation (min): 73 min   Short Term Goals: Week 1:  PT Short Term Goal 1 (Week 1): Pt will perform bed<>chair transfer min assist PT Short Term Goal 2 (Week 1): Pt will ambulate x 50 ft min assist with LRAD PT Short Term Goal 3 (Week 1): Pt will perform sit<>stand from w/c or mat with min assist  Skilled Therapeutic Interventions/Progress Updates:    Pt supine in bed upon PT arrival,agreeable to therapy tx and denies pain. Therapist assisted to don teds and shoes. Therapist performed adductor and hamstring stretching 2 x 30 sec each to R LE. Pt transferred to sitting with supervision. Pt performed stand pivot to w/c with min assist, verbal cues for techniques. Pt transported to the gym. Pt performed stand pivot to w/c with min assist, verbal cues for techniques. Pt performed 2 x 5 sit<>stands this session for LE strengthening, in standing working on static standing balance without UE support, min-mod assist. Pt performed stand pivot to w/c mod assist. Therapist donned R AFO to trial this session with shoe cover for increased foot clearance. Pt ambulated 2 x 30 ft this session using L rail and min assist, x 20 ft using RW and mod assist, max verbal cues for sequencing, cues for R step length and for R knee extension during stance. Pt with difficulty performing L lateral weightshift to clear R LE during swing phase. Pt transferred to mat mod assist. Pt worked on Left lateral weightshifting and pre-gait stepping in place with R LE, no UE support and using mirror for visual feedback, min-mod assist with therapist providing manual facilitation for weightshifting, x 2 trials. Pt transported back to room and left in w/c with needs in reach and chair alarm set.   Therapy Documentation Precautions:   Precautions Precautions: Fall Precaution Comments: R hemi, R knee buckles Restrictions Weight Bearing Restrictions: No    Therapy/Group: Individual Therapy  Netta Corrigan , PT, DPT 11/27/2018, 7:52 AM

## 2018-11-28 ENCOUNTER — Inpatient Hospital Stay (HOSPITAL_COMMUNITY): Payer: Medicare Other

## 2018-11-28 ENCOUNTER — Inpatient Hospital Stay (HOSPITAL_COMMUNITY): Payer: Self-pay | Admitting: Physical Therapy

## 2018-11-28 ENCOUNTER — Encounter (HOSPITAL_COMMUNITY): Payer: Medicare Other | Admitting: Psychology

## 2018-11-28 ENCOUNTER — Inpatient Hospital Stay (HOSPITAL_COMMUNITY): Payer: Medicare Other | Admitting: Physical Therapy

## 2018-11-28 MED ORDER — POTASSIUM CHLORIDE CRYS ER 20 MEQ PO TBCR
20.0000 meq | EXTENDED_RELEASE_TABLET | Freq: Two times a day (BID) | ORAL | Status: DC
  Administered 2018-11-28 – 2018-12-06 (×17): 20 meq via ORAL
  Filled 2018-11-28 (×17): qty 1

## 2018-11-28 MED ORDER — POTASSIUM CHLORIDE CRYS ER 20 MEQ PO TBCR: 20.0000 meq | EXTENDED_RELEASE_TABLET | Freq: Every day | ORAL | Status: DC

## 2018-11-28 NOTE — Consult Note (Signed)
Neuropsychological Consultation   Patient:   [name] [name]   DOB:   17-Sep-1933  MR Number:  [id number]  Location:  [facility] [place] [id number] [place] [address] Dept: [place]: [phone]           Date of Service:   11/28/2018  Start Time:   1 PM End Time:   2 PM  Provider/Observer:  Ilean Skill, Psy.D.       Clinical Neuropsychologist       Billing Code/Service: [phone]  Chief Complaint:    [name] [name] is an 85 year old female with history of hypothyroidism and had been living on her own.  The patient was independent and active prior to admission.  The patient presented on 11/18/2018 with right-sided weakness and fall.  Cranial CT scan showed small linear right left frontal white matter hypodensity extending to the cortex.  Patient did not receive TPA.  MRI/MRA showed acute lacunar infarction in the posterior left corona radiata and posterior lentiform with 2 small superimposed small acute white matter infarctions in the posterior left MCA territory.  The patient has had a lot of frustration and coping issues with residual physical and cognitive deficits post stroke.  There is a lot of anxiety and worry on the patient's part regarding her ability to return back to her independent functioning.  Reason for Service:  The patient was referred for neuropsychological consult due to coping and adjustment issues following her stroke.  Below is the HPI for the current admission.  [name] [name] is an 85 year old right-handed female history of hypothyroidism. Per chart review she lives alone. One level home with level entry. Independent and active prior to admission. She has a son who is retired and offers good support.Presented 11/18/2018 with right-sided weakness following to the floor. Cranial CT scan showed a small linear right left frontal white matter hypodensity extending to the cortex.  No hemorrhage. Patient did not receive TPA. MRI/MRA showed acute lacunar infarction in the posterior left corona radiata and posterior lentiform with 2 small superimposed small acute white matter infarctions in the posterior left MCA territory. No large vessel occlusion but positive for moderate stenosis of both the distal left A2 and left anterior M2 branches. Echocardiogram with ejection fraction of 65%. Systolic function was normal. Carotid Dopplers with no ICA stenosis. Neurology follow-up maintain on aspirin for CVA prophylaxis. TEE is pending. Subcutaneous Lovenox for DVT prophylaxis. Therapy evaluations completed with recommendations of physical medicine rehabilitation consult. Patient was admitted for a rehabilitation program.  Current Status:  The patient does acknowledge that she is having a good deal of stress and worry regarding fears about residual deficits from the stroke.  The patient was very active and independent prior to this and her worries are having a negative impact on her motivation for therapeutic efforts.  While the patient is engaging she reports that it is hard for her to fully exert effort and these therapeutic interventions as she is worried about whether she will be able to return to work previous level of function.   Behavioral Observation: [name] [name]  presents as a 85 y.o.-year-old Right Caucasian Female who appeared her stated age. her dress was Appropriate and she was Well Groomed and her manners were Appropriate to the situation.  her participation was indicative of Appropriate and Attentive behaviors.  There were any physical disabilities noted.  she displayed an appropriate level of cooperation and motivation.  Interactions:    Active Appropriate and Attentive  Attention:   abnormal and attention span appeared shorter than expected for age  Memory:   abnormal; remote memory intact, recent memory impaired  Visuo-spatial:  not examined  Speech  (Volume):  low  Speech:   normal; normal  Thought Process:  Coherent and Relevant  Though Content:  WNL; not suicidal and not homicidal  Orientation:   person, place, time/date and situation  Judgment:   Good  Planning:   Good  Affect:    Anxious  Mood:    Anxious and Dysphoric  Insight:   Good  Intelligence:   normal  Medical History:   Past Medical History:  Diagnosis Date  . Anemia    h/o fibroids  . Bipolar disorder ([place])   . Depression    bipolar  . Eczema   . Hypothyroidism   . Insomnia   . Osteoarthritis   . Osteoporosis   . Psoriasis              Psychiatric History:  The patient does have a prior medical history including bipolar disorder and depressive disorder.  The patient reports that she does feel like the loss of function that she is quite concerned about is playing a role but she denies feel like she is any type of manic or depressive state but that she is more dysphoric regarding fears of loss of independence.  Family Med/Psych History:  Family History  Problem Relation Age of Onset  . Colon cancer Brother   . Lung cancer Father   . Prostate cancer Father   . Arthritis Father   . Cancer Father        lung, prostate  . Heart disease Brother   . Cancer Brother        lung  . Lung cancer Brother   . Aortic aneurysm Brother   . Cancer Other        colon    Risk of Suicide/Violence: low the patient denies any suicidal or homicidal ideation.  Impression/DX: [name] [name] is an 85 year old female with history of hypothyroidism and had been living on her own.  The patient was independent and active prior to admission.  The patient presented on 11/18/2018 with right-sided weakness and fall.  Cranial CT scan showed small linear right left frontal white matter hypodensity extending to the cortex.  Patient did not receive TPA.  MRI/MRA showed acute lacunar infarction in the posterior left corona radiata and posterior lentiform with 2 small superimposed  small acute white matter infarctions in the posterior left MCA territory.  The patient has had a lot of frustration and coping issues with residual physical and cognitive deficits post stroke.  There is a lot of anxiety and worry on the patient's part regarding her ability to return back to her independent functioning.  The patient does acknowledge that she is having a good deal of stress and worry regarding fears about residual deficits from the stroke.  The patient was very active and independent prior to this and her worries are having a negative impact on her motivation for therapeutic efforts.  While the patient is engaging she reports that it is hard for her to fully exert effort and these therapeutic interventions as she is worried about whether she will be able to return to work previous level of function.          Electronically Signed   _______________________ Ilean Skill, Psy.D.

## 2018-11-28 NOTE — Progress Notes (Signed)
Physical Therapy Session Note  Patient Details  Name: [name] [name] MRN: [id number] Date of Birth: 11/13/1932  Today's Date: 11/28/2018 PT Individual Time: 5056-9794 PT Individual Time Calculation (min): 60 min   Short Term Goals: Week 1:  PT Short Term Goal 1 (Week 1): Pt will perform bed<>chair transfer min assist PT Short Term Goal 2 (Week 1): Pt will ambulate x 50 ft min assist with LRAD PT Short Term Goal 3 (Week 1): Pt will perform sit<>stand from w/c or mat with min assist  Skilled Therapeutic Interventions/Progress Updates: Pt presented in w/c agreeable to therapy and denies pain throughout session. Pt instructed in w/c propulsion via hemi technique and propelled approx 80ft with minA with cues for increased use of LLE to maintain straight trajectory. Pt transported remaining distance to rehab gym and performed squat pivot w/c mat minA with min verbal cues for RLE placement. Performed standing TKE 2 x 10 for attention to R quad activation and toe taps to 2in step with LLE for increased wt shifting to RLE. Practiced ambulation with RW with Gerald Stabs from [place] present to assess. Pt ambulated approx 20 ft wth RW and minA and max multimodal cues for R quad activation, and  increasing R wt shift to facilitate L foot clearance. Gerald Stabs took shoe to apply toe cap and will provide AFO. Upon return to w/c practiced placing clothespins with LUE in standing crossing midline to increase R wt shifting. Pt initially required mod verbal cues for R quad activation however decreased with increased time. Pt transported back to room at end of session and requested to use toilet this performed stand pivot with wall rail with PTA providing total A for clothing management. Pt left at toilet with Alycia NT advised of pt's disposition.      Therapy Documentation Precautions:  Precautions Precautions: Fall Precaution Comments: R hemi, R knee buckles Restrictions Weight Bearing Restrictions: No   Therapy/Group:  Individual Therapy  Donnarae Rae  Nuvia Hileman, PTA  11/28/2018, 12:32 PM

## 2018-11-28 NOTE — Telephone Encounter (Signed)
Dr. Lovena Le reviewed "AF" episodes--advised ECGs show NSR w/PACs, not true AF. Continue to monitor for true arrhythmias.

## 2018-11-28 NOTE — Progress Notes (Signed)
[place] PHYSICAL MEDICINE & REHABILITATION PROGRESS NOTE   Subjective/Complaints:  Pt has no pain today.  Still feels weak in RUE, needs left hand assist for oro-facial hygiene ROS-denies CP, SOB, N/V/D  Objective:   No results found. No results for input(s): WBC, HGB, HCT, PLT in the last 72 hours. Recent Labs    11/27/18 0526 11/27/18 0929  NA  --  140  K  --  3.7  CL  --  108  CO2  --  22  GLUCOSE  --  151*  BUN  --  20  CREATININE 0.95 1.07*  CALCIUM  --  9.1    Intake/Output Summary (Last 24 hours) at 11/28/2018 0757 Last data filed at 11/27/2018 1820 Gross per 24 hour  Intake 900 ml  Output -  Net 900 ml     Physical Exam: Vital Signs Blood pressure (!) 149/74, pulse 81, temperature 97.9 F (36.6 C), temperature source Oral, resp. rate 19, height 5\' 5"  (1.651 m), weight 68.2 kg, SpO2 95 %. Constitutional: No distress . Vital signs reviewed. HENT: Normocephalic.  Atraumatic.  Poor dentition. Eyes: EOMI. No discharge. Cardiovascular: RRR.  No JVD. Respiratory: CTA bilaterally.  Normal effort. GI: BS +. Non-distended. Musc: Lower extremity edema Neurologic: Alert and oriented Motor: 5/5 LUE proximal to distal 4+-5/5 LLE proximal to distal, unchanged 4-4+/5 RUE proximal to distal 4-4+/5 RLE proximal and distal, unchanged Skin: No evidence of breakdown, no evidence of rash Psych: Normal mood.  Normal affect.  Assessment/Plan: 1. Functional deficits secondary to Left corona radiata and posterior MCA infarcts onset 11/18/18 which require 3+ hours per day of interdisciplinary therapy in a comprehensive inpatient rehab setting.  Physiatrist is providing close team supervision and 24 hour management of active medical problems listed below.  Physiatrist and rehab team continue to assess barriers to discharge/monitor patient progress toward functional and medical goals  Care Tool:  Bathing    Body parts bathed by patient: Right arm, Left arm, Chest, Abdomen    Body parts bathed by helper: Buttocks, Right lower leg, Left lower leg     Bathing assist Assist Level: Set up assist     Upper Body Dressing/Undressing Upper body dressing   What is the patient wearing?: Pull over shirt    Upper body assist Assist Level: Supervision/Verbal cueing    Lower Body Dressing/Undressing Lower body dressing      What is the patient wearing?: Pants     Lower body assist Assist for lower body dressing: Moderate Assistance - Patient 50 - 74%     Toileting Toileting    Toileting assist Assist for toileting: Moderate Assistance - Patient 50 - 74%     Transfers Chair/bed transfer  Transfers assist     Chair/bed transfer assist level: Minimal Assistance - Patient > 75%     Locomotion Ambulation   Ambulation assist      Assist level: Moderate Assistance - Patient 50 - 74% Assistive device: Walker-rolling Max distance: 20 ft   Walk 10 feet activity   Assist     Assist level: Moderate Assistance - Patient - 50 - 74% Assistive device: Walker-rolling   Walk 50 feet activity   Assist Walk 50 feet with 2 turns activity did not occur: Safety/medical concerns(R hemiparesis, buckling)         Walk 150 feet activity   Assist Walk 150 feet activity did not occur: Safety/medical concerns         Walk 10 feet on uneven surface  activity   Assist Walk 10 feet on uneven surfaces activity did not occur: Safety/medical concerns         Wheelchair     Assist Will patient use wheelchair at discharge?: Yes Type of Wheelchair: Manual    Wheelchair assist level: Moderate Assistance - Patient 50 - 74% Max wheelchair distance: 150'    Wheelchair 50 feet with 2 turns activity    Assist        Assist Level: Moderate Assistance - Patient 50 - 74%   Wheelchair 150 feet activity     Assist Wheelchair 150 feet activity did not occur: Safety/medical concerns   Assist Level: Moderate Assistance - Patient 50 -  74%      Medical Problem List and Plan: 1.Right side weaknesssecondary to embolicleftterritory MCA infarction.Status post loop recorder Continue CIR, team conf in am  ASA and plavix for 3 weeks then ASA 81mg  alone- on 12/09/2018 2. DVT Prophylaxis/Anticoagulation: subcutaneous Lovenox. Monitor for any bleeding episodes 3. Pain Management:Tylenol as needed 4. Mood:Abilify 5 mg daily, Bipolar disorder  Grounds pass ordered on 2/10 5. Neuropsych: This patientiscapable of making decisions on herown behalf. 6. Skin/Wound Care:Routine skin checks 7. Fluids/Electrolytes/Nutrition:Routine in and out's   BMP within acceptable range on 2/7, creatinine within normal less than 2/10 8. Hypothyroidism. Synthroid 9. Hyperlipidemia. Lipitor 10.  Chronic insomnia: Resumed Ambien but at 5mg  11.  Hypokalemia:   Potassium 4.0 on 2/7, repeat 2/10 3.7   Continue KCl 35meq BID 12.  Labile blood pressure  Labile on 2/10  Monitor for trend 13.  Peripheral edema  Lasix 20 started on 2/8, cont KCL  LOS: 8 days A FACE TO FACE EVALUATION WAS PERFORMED  Charlett Blake 11/28/2018, 7:57 AM

## 2018-11-28 NOTE — Progress Notes (Signed)
Physical Therapy Session Note  Patient Details  Name: [name] [name] MRN: [id number] Date of Birth: [birth year]  Today's Date: 11/28/2018 PT Individual Time: 3016-0109 PT Individual Time Calculation (min): 54 min   Short Term Goals: Week 1:  PT Short Term Goal 1 (Week 1): Pt will perform bed<>chair transfer min assist PT Short Term Goal 2 (Week 1): Pt will ambulate x 50 ft min assist with LRAD PT Short Term Goal 3 (Week 1): Pt will perform sit<>stand from w/c or mat with min assist  Skilled Therapeutic Interventions/Progress Updates:  Pt received in w/c with son [name]) present to observe session. Transported pt to/from gym via w/c dependent assist for time management. Pt transfers w/c<>mat table and w/c<>kinetron via stand pivot with min assist. Standing EOM pt performs LLE stepping forwards/backwards with therapist providing tactile/verbal cuing to activate RLE quads, focusing on weight shifting R when stepping with LLE and no buckling noted in RLE. Pt ambulates 8 ft x 4 without AD & light mod assist with tactile/verbal cuing to activate quads in RLE during stance phase, focusing on increased hip/knee flexion & foot clearance RLE during swing phase. Pt utilized cybex kinetron in sitting then standing with BUE support with task focusing on BLE strengthening, R NMR, balance & weight shifting with pt requiring frequent rest breaks 2/2 B knee fatigue. Pt propels w/c ~30 ft with BUE with mod assist for RUE placement for reaching back & producing push equal to LUE. At end of session pt left sitting in w/c in room with chair alarm donned, needs in reach, & son present to supervise.   No c/o pain reported during session.   Therapy Documentation Precautions:  Precautions Precautions: Fall Precaution Comments: R hemi, R knee buckles Restrictions Weight Bearing Restrictions: No    Therapy/Group: Individual Therapy  Waunita Schooner 11/28/2018, 3:04 PM

## 2018-11-28 NOTE — Progress Notes (Signed)
Occupational Therapy Session Note  Patient Details  Name: [name] [name] MRN: [id number] Date of Birth: August 21, 1933  Today's Date: 11/28/2018 OT Individual Time: 4010-2725 OT Individual Time Calculation (min): 75 min    Short Term Goals: Week 1:  OT Short Term Goal 1 (Week 1): Pt will complete grooming task bimanually with CGA to demonstrate improved R UE coordination OT Short Term Goal 2 (Week 1): Pt will transfer to toilet with min A OT Short Term Goal 3 (Week 1): Pt will don pants with mod A OT Short Term Goal 4 (Week 1): Pt will feed self 1/2 of meal with R UE with 75% accuracy   Skilled Therapeutic Interventions/Progress Updates:    Pt sitting in bed, with HOB elevated, eating breakfast.  Initial intervention focused on tray set up and self feeding.  Pt feeds self with built-up utensils but requires assistance opening small condiment containers.  Pt able to open yogurt and drink containers without assistance.  OT intervention with focus on self feeding, bed mobility, functional transfers, toileting, LB dressing, grooming tasks, and standing balance to increase independence with BADLs. Pt sat EOB with supervision and tranfserred to w/c with min A.  Pt performed toilet tranfsers with min A using grab bars and performed toileting tasks with CGA.  Pt required mod A for LB dressing tasks but is able to stand at sink with CGA to pull pants over hips.  Pt completed grooming tasks at sink with setup. Pt requires more than a reasonable amount of time to complete tasks.  Pt continues to initiate use of RUE in all functional tasks. Pt remained in w/c with all needs within reach and belt alarm activated.   Therapy Documentation Precautions:  Precautions Precautions: Fall Precaution Comments: R hemi, R knee buckles Restrictions Weight Bearing Restrictions: No   Pain:  Pt denies pain this morning Therapy/Group: Individual Therapy  Leroy Libman 11/28/2018, 9:20 AM

## 2018-11-28 NOTE — Plan of Care (Signed)
  Problem: Consults Goal: RH STROKE PATIENT EDUCATION Description See Patient Education module for education specifics  Outcome: Progressing   Problem: RH BOWEL ELIMINATION Goal: RH STG MANAGE BOWEL WITH ASSISTANCE Description STG Manage Bowel with [place].  Outcome: Progressing Goal: RH STG MANAGE BOWEL W/MEDICATION W/ASSISTANCE Description STG Manage Bowel with Medication with [place].  Outcome: Progressing   Problem: RH BLADDER ELIMINATION Goal: RH STG MANAGE BLADDER WITH ASSISTANCE Description STG Manage Bladder With Min Assistance  Outcome: Progressing   Problem: RH SKIN INTEGRITY Goal: RH STG MAINTAIN SKIN INTEGRITY WITH ASSISTANCE Description STG Maintain Skin Integrity With [place].  Outcome: Progressing   Problem: RH SAFETY Goal: RH STG ADHERE TO SAFETY PRECAUTIONS W/ASSISTANCE/DEVICE Description STG Adhere to Safety Precautions With Min Assistance/Device.  Outcome: Progressing   Problem: RH KNOWLEDGE DEFICIT Goal: RH STG INCREASE KNOWLEDGE OF HYPERTENSION Description Pt will be able to verbalize stated parameters set by physician for hypertension with min assist  Outcome: Progressing Goal: RH STG INCREASE KNOWLEDGE OF STROKE PROPHYLAXIS Outcome: Progressing

## 2018-11-29 ENCOUNTER — Inpatient Hospital Stay (HOSPITAL_COMMUNITY): Payer: Medicare Other | Admitting: *Deleted

## 2018-11-29 ENCOUNTER — Inpatient Hospital Stay (HOSPITAL_COMMUNITY): Payer: Self-pay

## 2018-11-29 ENCOUNTER — Inpatient Hospital Stay (HOSPITAL_COMMUNITY): Payer: Medicare Other | Admitting: Occupational Therapy

## 2018-11-29 ENCOUNTER — Inpatient Hospital Stay (HOSPITAL_COMMUNITY): Payer: Medicare Other

## 2018-11-29 NOTE — Progress Notes (Signed)
Physical Therapy Weekly Progress Note  Patient Details  Name: [name] [name] MRN: [id number] Date of Birth: November 17, 1932  Beginning of progress report period: November 21, 2018 End of progress report period: November 29, 2018  Today's Date: 11/29/2018 PT Individual Time: 0800-0900  And 1751-0258 PT Individual Time Calculation (min): 60 min and 70 min  Patient has met 3 of 3 short term goals. Pt is progressing with functional mobility and is currently requiring min assist. Pt continues to be limited by decreased R LE muscular activation requiring increased cueing throughout functional mobility.   Patient continues to demonstrate the following deficits muscle weakness, impaired timing and sequencing and decreased coordination and decreased standing balance, decreased postural control, hemiplegia and decreased balance strategies and therefore will continue to benefit from skilled PT intervention to increase functional independence with mobility.  Patient progressing toward long term goals..  Continue plan of care.  PT Short Term Goals Week 1:  PT Short Term Goal 1 (Week 1): Pt will perform bed<>chair transfer min assist PT Short Term Goal 1 - Progress (Week 1): Met PT Short Term Goal 2 (Week 1): Pt will ambulate x 50 ft min assist with LRAD PT Short Term Goal 2 - Progress (Week 1): Met PT Short Term Goal 3 (Week 1): Pt will perform sit<>stand from w/c or mat with min assist PT Short Term Goal 3 - Progress (Week 1): Met  Week 2: STG=LTG  Skilled Therapeutic Interventions/Progress Updates:    Session 1: Pt supine in bed upon PT arrival, agreeable to therapy tx and denies pain. Pt donned pants while supine in bed, performed bridging to pull pants over hips. Pt transferred to sitting EOB with supervision and verbal cues for techniques. Pt transferred to w/c with min assist, verbal cues for techniques, stand pivot. Pt transported to the gym and transferred to mat stand pivot min assist. Pt performed  x 10 sit<>stands with symmetric LE weightbearing for strengthening, emphasis on R LE knee extension in standing and maintaining midline with mirror for visual feedback. Pt worked on R LE knee control this session to perform pre-gait stepping in place with L LE, no UE support and using mirror for visual feedback, therapist fading verbal/tactile cues for R knee extension during stance, min assist. Pt transferred to supine with supervision. Pt performed LE strengthening/R LE neuro re-ed to performed resisted hip abduction in hooklying 2 x 10, resisted hip abduction with bridging 2 x 10, R LE PNF D2 flexion/extension x 10. Pt transferred back to sitting with supervision. Pt performed sit<>stand with min assist, performed another trial of L LE stepping in place for R LE stance control x 10 steps. Pt transferred back to w/c with min assist and transported back to room, left with chair alarm set and needs in reach.   Session 2: Pt seated in w/c upon PT arrival, agreeable to therapy tx and denies pain. Therapist donnes shoes and R AFO total assist. Pt transported to the gym. Pt performed stand pivot to w/c with min assist. Pt performed x 5 sit<>stands from mat with min assist working on LE strength. Pt performed pre-gait R LE stance control this session while stepping in place with L LE x 10 steps, min assist with cues. Pt ambulated x 30 ft and x 50 ft with RW and min assist, verbal cues for R quad activation during stance, increased cues during turning to sit. PT transferred to supine with min assist and performed LE strengthening exercises, 2 x 10 each: supine  marches, bridges, and assisted R straight leg raise. Pt transferred back to sitting with supervision and verbal cues for techniques. Pt performed sit<>stand with min assist, performed x 10 mini squats without UE support working on knee control and strength. Pt worked on R LE knee control and hip strength to perform side stepping in place with L LE, no UE support  and min-mod assist. Pt ambulated x 50 ft with RW and min assist, verbal cues for sequencing and R quad activation. Pt performed stand pivot back to w/c and transported back to room. Pt left seated in w/c with needs in reach and chair alarm set.   Therapy Documentation Precautions:  Precautions Precautions: Fall Precaution Comments: R hemi, R knee buckles Restrictions Weight Bearing Restrictions: No   Therapy/Group: Individual Therapy  Netta Corrigan, PT, DPT 11/29/2018, 7:57 AM

## 2018-11-29 NOTE — Plan of Care (Signed)
  Problem: Consults Goal: RH STROKE PATIENT EDUCATION Description See Patient Education module for education specifics  Outcome: Progressing   Problem: RH BOWEL ELIMINATION Goal: RH STG MANAGE BOWEL WITH ASSISTANCE Description STG Manage Bowel with [place].  Outcome: Progressing Goal: RH STG MANAGE BOWEL W/MEDICATION W/ASSISTANCE Description STG Manage Bowel with Medication with [place].  Outcome: Progressing   Problem: RH BLADDER ELIMINATION Goal: RH STG MANAGE BLADDER WITH ASSISTANCE Description STG Manage Bladder With Min Assistance  Outcome: Progressing   Problem: RH SKIN INTEGRITY Goal: RH STG MAINTAIN SKIN INTEGRITY WITH ASSISTANCE Description STG Maintain Skin Integrity With [place].  Outcome: Progressing   Problem: RH SAFETY Goal: RH STG ADHERE TO SAFETY PRECAUTIONS W/ASSISTANCE/DEVICE Description STG Adhere to Safety Precautions With Min Assistance/Device.  Outcome: Progressing   Problem: RH KNOWLEDGE DEFICIT Goal: RH STG INCREASE KNOWLEDGE OF HYPERTENSION Description Pt will be able to verbalize stated parameters set by physician for hypertension with min assist  Outcome: Progressing Goal: RH STG INCREASE KNOWLEDGE OF STROKE PROPHYLAXIS Outcome: Progressing

## 2018-11-29 NOTE — Progress Notes (Signed)
[place] PHYSICAL MEDICINE & REHABILITATION PROGRESS NOTE   Subjective/Complaints:  Good day in therapy, no problem with sleep ROS-denies CP, SOB, N/V/D  Objective:   No results found. No results for input(s): WBC, HGB, HCT, PLT in the last 72 hours. Recent Labs    11/27/18 0526 11/27/18 0929  NA  --  140  K  --  3.7  CL  --  108  CO2  --  22  GLUCOSE  --  151*  BUN  --  20  CREATININE 0.95 1.07*  CALCIUM  --  9.1    Intake/Output Summary (Last 24 hours) at 11/29/2018 2683 Last data filed at 11/28/2018 1812 Gross per 24 hour  Intake 360 ml  Output -  Net 360 ml     Physical Exam: Vital Signs Blood pressure (!) 153/88, pulse 79, temperature (!) 97.5 F (36.4 C), temperature source Oral, resp. rate 19, height _0  (1.651 m), weight 68.2 kg, SpO2 95 %. Constitutional: No distress . Vital signs reviewed. HENT: Normocephalic.  Atraumatic.  Poor dentition. Eyes: EOMI. No discharge. Cardiovascular: RRR.  No JVD. Respiratory: CTA bilaterally.  Normal effort. GI: BS +. Non-distended. Musc: Lower extremity edema Neurologic: Alert and oriented Motor: 5/5 LUE proximal to distal 4+-5/5 LLE proximal to distal, unchanged 4/5 RUE proximal to distal 4-/5 RLE proximal and distal, unchanged Skin: No evidence of breakdown, no evidence of rash Psych: Normal mood.  Normal affect.  Assessment/Plan: 1. Functional deficits secondary to Left corona radiata and posterior MCA infarcts onset 11/18/18 which require 3+ hours per day of interdisciplinary therapy in a comprehensive inpatient rehab setting.  Physiatrist is providing close team supervision and 24 hour management of active medical problems listed below.  Physiatrist and rehab team continue to assess barriers to discharge/monitor patient progress toward functional and medical goals  Care Tool:  Bathing    Body parts bathed by patient: Right arm, Left arm, Chest, Abdomen   Body parts bathed by helper: Buttocks, Right lower  leg, Left lower leg     Bathing assist Assist Level: Set up assist     Upper Body Dressing/Undressing Upper body dressing   What is the patient wearing?: Pull over shirt    Upper body assist Assist Level: Supervision/Verbal cueing    Lower Body Dressing/Undressing Lower body dressing      What is the patient wearing?: Pants     Lower body assist Assist for lower body dressing: Moderate Assistance - Patient 50 - 74%     Toileting Toileting    Toileting assist Assist for toileting: Moderate Assistance - Patient 50 - 74%     Transfers Chair/bed transfer  Transfers assist     Chair/bed transfer assist level: Minimal Assistance - Patient > 75%     Locomotion Ambulation   Ambulation assist      Assist level: Moderate Assistance - Patient 50 - 74% Assistive device: (none) Max distance: 8 ft    Walk 10 feet activity   Assist     Assist level: Moderate Assistance - Patient - 50 - 74% Assistive device: Walker-rolling   Walk 50 feet activity   Assist Walk 50 feet with 2 turns activity did not occur: Safety/medical concerns(R hemiparesis, buckling)         Walk 150 feet activity   Assist Walk 150 feet activity did not occur: Safety/medical concerns         Walk 10 feet on uneven surface  activity   Assist Walk 10 feet on  uneven surfaces activity did not occur: Safety/medical concerns         Wheelchair     Assist Will patient use wheelchair at discharge?: Yes Type of Wheelchair: Manual    Wheelchair assist level: Moderate Assistance - Patient 50 - 74% Max wheelchair distance: 30 ft     Wheelchair 50 feet with 2 turns activity    Assist        Assist Level: Moderate Assistance - Patient 50 - 74%   Wheelchair 150 feet activity     Assist Wheelchair 150 feet activity did not occur: Safety/medical concerns   Assist Level: Moderate Assistance - Patient 50 - 74%      Medical Problem List and Plan: 1.Right side  weaknesssecondary to embolicleftterritory MCA infarction.Status post loop recorder Continue CIR, Team conference today please see physician documentation under team conference tab, met with team face-to-face to discuss problems,progress, and goals. Formulized individual treatment plan based on medical history, underlying problem and comorbidities.  ASA and plavix for 3 weeks then ASA 4m alone- on 12/09/2018 2. DVT Prophylaxis/Anticoagulation: subcutaneous Lovenox. Monitor for any bleeding episodes 3. Pain Management:Tylenol as needed 4. Mood:Abilify 5 mg daily, Bipolar disorder  Grounds pass ordered on 2/10 5. Neuropsych: This patientiscapable of making decisions on herown behalf. 6. Skin/Wound Care:Routine skin checks 7. Fluids/Electrolytes/Nutrition:Routine in and out's   BMP within acceptable range on 2/7, creatinine within normal less than 2/10 8. Hypothyroidism. Synthroid 9. Hyperlipidemia. Lipitor 10.  Chronic insomnia: Resumed Ambien but at 521m11.  Hypokalemia:   Potassium 4.0 on 2/7, repeat 2/10 3.7   Continue KCl 2046mBID 12.  Labile blood pressure   Vitals:   11/28/18 1927 11/29/18 0437  BP: (!) 148/64 (!) 153/88  Pulse: 79 79  Resp: 19 19  Temp: 97.6 F (36.4 C) (!) 97.5 F (36.4 C)  SpO2: 96% 95%   13.  Peripheral edema- improved  Lasix 20 started on 2/8, cont KCL  LOS: 9 days A FACE TO FACE EVALUATION WAS PERFORMED  AndCharlett Blake12/2020, 8:28 AM

## 2018-11-29 NOTE — Progress Notes (Signed)
Occupational Therapy Weekly Progress Note  Patient Details  Name: [name] [name] MRN: [id number] Date of Birth: 12-23-1932  Beginning of progress report period: November 21, 2018 End of progress report period: November 29, 2018  Today's Date: 11/29/2018 OT Individual Time: 1100-1204 OT Individual Time Calculation (min): 64 min    Patient has met 2 of 4 short term goals.  Patient has made progress with use of right hand, dressing and mobility but continues to require assistance and cues for task completion.  Patient continues to demonstrate the following deficits: decreased coordination, decreased attention to right and decreased standing balance and therefore will continue to benefit from skilled OT intervention to enhance overall performance with BADL and iADL.  Patient progressing toward long term goals..  Continue plan of care.  OT Short Term Goals Week 1:  OT Short Term Goal 1 (Week 1): Pt will complete grooming task bimanually with CS to demonstrate improved R UE coordination OT Short Term Goal 1 - Progress (Week 1): Partly met OT Short Term Goal 2 (Week 1): Pt will transfer to toilet with CS OT Short Term Goal 2 - Progress (Week 1): Met OT Short Term Goal 3 (Week 1): Pt will don pants with min A OT Short Term Goal 3 - Progress (Week 1): Met OT Short Term Goal 4 (Week 1): Pt will feed self 1/2 of meal with R UE with 100% accuracy  OT Short Term Goal 4 - Progress (Week 1): Partly met Week 2:     Skilled Therapeutic Interventions/Progress Updates:    Patient seated in w/c, denies pain and is ready for therapy session.   Reviewed goals and plan for upcoming week.  UB reassessment completed - Patient with 3/4 AROM at right shoulder and full distal.  Coordination deficits persist limiting eating and other self care tasks.  9 hole peg:  R = unable, L = 32 sec, Box & Blocks:  R = 14, L = 35. Completed standing balance, reaching and dexterity activities with good tolerance.  Sit to stand  CG, occ min A, ambulation short distances with RW CG and cues for turning and safe reach for chair.  Hand writing exercises with improved mobility noted with repetition.   Patient returned to room, remained seated in w/c with seat belt alarm set, call bell in reach and lunch tray set up.    Therapy Documentation Precautions:  Precautions Precautions: Fall Precaution Comments: R hemi, R knee buckles Restrictions Weight Bearing Restrictions: No General:   Vital Signs:  Pain: Pain Assessment Pain Scale: 0-10 Pain Score: 0-No pain ADL:   Other Treatments:     Therapy/Group: Individual Therapy  Carlos Levering 11/29/2018, 12:19 PM

## 2018-11-29 NOTE — Telephone Encounter (Signed)
Left a message on the pt voicemail.

## 2018-11-29 NOTE — Evaluation (Signed)
Recreational Therapy Assessment and Plan  Patient Details  Name: ONETA SIGMAN MRN: [id number] Date of Birth: 1933/06/22 Today's Date: 11/29/2018  Rehab Potential: Good ELOS: discharge 2/21   Assessment  Problem List:      Patient Active Problem List   Diagnosis Date Noted  . Left middle cerebral artery stroke ([place]) 11/20/2018  . Acute arterial ischemic stroke, multifocal, posterior circulation, left ([place]) 11/19/2018  . Stroke ([place]) 11/18/2018  . Stroke (cerebrum) ([place]) 11/18/2018  . Myalgia 08/06/2018  . Left lower quadrant pain 08/29/2017  . Dyspepsia 03/25/2017  . Atherosclerosis of aorta ([place]) 09/27/2016  . COPD with acute bronchitis ([place]) 09/27/2016  . Acute blood loss anemia 03/16/2015  . Osteoarthritis of right knee 03/04/2015  . Status post total right knee replacement 03/04/2015  . Contusion of knee, right 10/23/2014  . IBS 02/23/2010  . HEMORRHOIDS, EXTERNAL 01/01/2010  . HEMATURIA UNSPECIFIED 11/04/2009  . NAUSEA 06/02/2009  . URINARY INCONTINENCE, STRESS, FEMALE 05/09/2009  . Acute sinusitis 11/25/2008  . UTI 04/23/2008  . COLONIC POLYPS 12/21/2007  . Bipolar disorder ([place]) 12/21/2007  . DEVIATED NASAL SEPTUM 12/21/2007  . HERNIA, VENTRAL 12/21/2007  . Osteoarthritis 12/21/2007  . Hypothyroidism 09/01/2007  . ECZEMA 09/01/2007  . Psoriasis 09/01/2007  . Osteoporosis 09/01/2007  . Insomnia 09/01/2007    Past Medical History:      Past Medical History:  Diagnosis Date  . Anemia    h/o fibroids  . Bipolar disorder ([place])   . Depression    bipolar  . Eczema   . Hypothyroidism   . Insomnia   . Osteoarthritis   . Osteoporosis   . Psoriasis    Past Surgical History:       Past Surgical History:  Procedure Laterality Date  . ABDOMINAL HYSTERECTOMY  1982  . APPENDECTOMY  2003  . BREAST LUMPECTOMY  1947   left  . CATARACT EXTRACTION  2010  . EYE SURGERY    . HERNIA REPAIR    . KNEE ARTHROSCOPY  09/07/11   right knee   . LOOP RECORDER INSERTION N/A 11/20/2018   Procedure: LOOP RECORDER INSERTION;  Surgeon: Evans Lance, MD;  Location: [facility];  Service: Cardiovascular;  Laterality: N/A;  . NASAL SEPTUM SURGERY  1970's  . TONSILLECTOMY  1970's  . TONSILLECTOMY    . TOTAL KNEE ARTHROPLASTY Right 03/04/2015   Procedure: RIGHT TOTAL KNEE ARTHROPLASTY;  Surgeon: Mcarthur Rossetti, MD;  Location: [facility];  Service: Orthopedics;  Laterality: Right;  . VENTRAL HERNIA REPAIR  2005    Assessment & Plan Clinical Impression: Arneshia Ade Enslow is an 85 year old right-handed female history of hypothyroidism. Per chart review she lives alone. One level home with level entry. Independent and active prior to admission. She has a son who is retired and offers good support.Presented 11/18/2018 with right-sided weakness following to the floor. Cranial CT scan showed a small linear right left frontal white matter hypodensity extending to the cortex. No hemorrhage. Patient did not receive TPA. MRI/MRA showed acute lacunar infarction in the posterior left corona radiata and posterior lentiform with 2 small superimposed small acute white matter infarctions in the posterior left MCA territory. No large vessel occlusion but positive for moderate stenosis of both the distal left A2 and left anterior M2 branches. Echocardiogram with ejection fraction of 65%. Systolic function was normal. Carotid Dopplers with no ICA stenosis. Neurology follow-up maintain on aspirin for CVA prophylaxis. TEE is pending. Subcutaneous Lovenox for DVT prophylaxis. Therapy evaluations completed with  recommendations of physical medicine rehabilitation consult. Patient was admitted for a rehabilitation program. Patient transferred to CIR on 11/20/2018 .    Pt presents with decreased activity tolerance, decreased functional mobility, decreased balance, decreased coordination, right inattention Limiting pt's independence with leisure/community  pursuits.   Leisure History/Participation Premorbid leisure interest/current participation: Games - Cards;Sports - Exercise (Comment);Community - Grocery store;Sports - Swimming;Community - Designer, jewellery Leisure Participation Style: With Family/Friends Awareness of Community Resources: Good-identify 3 post discharge leisure resources Psychosocial / Spiritual Social interaction - Mood/Behavior: Cooperative Academic librarian Appropriate for Education?: Yes Patient Agreeable to [employer]?: Yes Recreational Therapy Orientation Orientation -Reviewed with patient: Available activity resources Strengths/Weaknesses Patient Strengths/Abilities: Willingness to participate;Active premorbidly Patient weaknesses: Physical limitations TR Patient demonstrates impairments in the following area(s): Endurance;Motor;Safety  Plan Rec Therapy Plan Is patient appropriate for Therapeutic Recreation?: Yes Rehab Potential: Good Treatment times per week: Min 1 TR session >25 minutes for communtiy reintegration during LOS Estimated Length of Stay: discharge 2/21 TR Treatment/Interventions: Adaptive equipment instruction;Cognitive remediation/compensation;Community reintegration;Patient/family education;Functional mobility training  Recommendations for other services: None   Discharge Criteria: Patient will be discharged from TR if patient refuses treatment 3 consecutive times without medical reason.  If treatment goals not met, if there is a change in medical status, if patient makes no progress towards goals or if patient is discharged from hospital.  The above assessment, treatment plan, treatment alternatives and goals were discussed and mutually agreed upon: by patient  [place] 11/29/2018, 12:28 PM

## 2018-11-29 NOTE — Progress Notes (Signed)
[facility] PHYSICAL MEDICINE & REHABILITATION PROGRESS NOTE   Subjective/Complaints:  Appreciate Neuropsych consult ROS-denies CP, SOB, N/V/D  Objective:   No results found. No results for input(s): WBC, HGB, HCT, PLT in the last 72 hours. Recent Labs    11/27/18 0526 11/27/18 0929  NA  --  140  K  --  3.7  CL  --  108  CO2  --  22  GLUCOSE  --  151*  BUN  --  20  CREATININE 0.95 1.07*  CALCIUM  --  9.1    Intake/Output Summary (Last 24 hours) at 11/29/2018 0745 Last data filed at 11/28/2018 1812 Gross per 24 hour  Intake 480 ml  Output -  Net 480 ml     Physical Exam: Vital Signs Blood pressure (!) 153/88, pulse 79, temperature (!) 97.5 F (36.4 C), temperature source Oral, resp. rate 19, height '5\' 5"'$  (1.651 m), weight 68.2 kg, SpO2 95 %. Constitutional: No distress . Vital signs reviewed. HENT: Normocephalic.  Atraumatic.  Poor dentition. Eyes: EOMI. No discharge. Cardiovascular: RRR.  No JVD. Respiratory: CTA bilaterally.  Normal effort. GI: BS +. Non-distended. Musc: Lower extremity edema Neurologic: Alert and oriented Motor: 5/5 LUE proximal to distal 4+-5/5 LLE proximal to distal, unchanged 4-4+/5 RUE proximal to distal 4-4+/5 RLE proximal and distal, unchanged Skin: No evidence of breakdown, no evidence of rash Psych: Normal mood.  Normal affect.  Assessment/Plan: 1. Functional deficits secondary to Left corona radiata and posterior MCA infarcts onset 11/18/18 which require 3+ hours per day of interdisciplinary therapy in a comprehensive inpatient rehab setting.  Physiatrist is providing close team supervision and 24 hour management of active medical problems listed below.  Physiatrist and rehab team continue to assess barriers to discharge/monitor patient progress toward functional and medical goals  Care Tool:  Bathing    Body parts bathed by patient: Right arm, Left arm, Chest, Abdomen   Body parts bathed by helper: Buttocks, Right lower leg,  Left lower leg     Bathing assist Assist Level: Set up assist     Upper Body Dressing/Undressing Upper body dressing   What is the patient wearing?: Pull over shirt    Upper body assist Assist Level: Supervision/Verbal cueing    Lower Body Dressing/Undressing Lower body dressing      What is the patient wearing?: Pants     Lower body assist Assist for lower body dressing: Moderate Assistance - Patient 50 - 74%     Toileting Toileting    Toileting assist Assist for toileting: Moderate Assistance - Patient 50 - 74%     Transfers Chair/bed transfer  Transfers assist     Chair/bed transfer assist level: Minimal Assistance - Patient > 75%     Locomotion Ambulation   Ambulation assist      Assist level: Moderate Assistance - Patient 50 - 74% Assistive device: (none) Max distance: 8 ft    Walk 10 feet activity   Assist     Assist level: Moderate Assistance - Patient - 50 - 74% Assistive device: Walker-rolling   Walk 50 feet activity   Assist Walk 50 feet with 2 turns activity did not occur: Safety/medical concerns(R hemiparesis, buckling)         Walk 150 feet activity   Assist Walk 150 feet activity did not occur: Safety/medical concerns         Walk 10 feet on uneven surface  activity   Assist Walk 10 feet on uneven surfaces activity did not  occur: Safety/medical concerns         Wheelchair     Assist Will patient use wheelchair at discharge?: Yes Type of Wheelchair: Manual    Wheelchair assist level: Moderate Assistance - Patient 50 - 74% Max wheelchair distance: 30 ft     Wheelchair 50 feet with 2 turns activity    Assist        Assist Level: Moderate Assistance - Patient 50 - 74%   Wheelchair 150 feet activity     Assist Wheelchair 150 feet activity did not occur: Safety/medical concerns   Assist Level: Moderate Assistance - Patient 50 - 74%      Medical Problem List and Plan: 1.Right side  weaknesssecondary to embolicleftterritory MCA infarction.Status post loop recorder Continue CIR, Team conference today please see physician documentation under team conference tab, met with team face-to-face to discuss problems,progress, and goals. Formulized individual treatment plan based on medical history, underlying problem and comorbidities.  ASA and plavix for 3 weeks then ASA 18m alone- on 12/09/2018 2. DVT Prophylaxis/Anticoagulation: subcutaneous Lovenox. Monitor for any bleeding episodes 3. Pain Management:Tylenol as needed 4. Mood:Abilify 5 mg daily, Bipolar disorder  Grounds pass ordered on 2/10 5. Neuropsych: This patientiscapable of making decisions on herown behalf. 6. Skin/Wound Care:Routine skin checks 7. Fluids/Electrolytes/Nutrition:Routine in and out's   BMP within acceptable range on 2/7, creatinine within normal less than 2/10 8. Hypothyroidism. Synthroid 9. Hyperlipidemia. Lipitor 10.  Chronic insomnia: Resumed Ambien but at 53m11.  Hypokalemia:   Potassium 4.0 on 2/7, repeat 2/10 3.7   Continue KCl 20102mBID 12.  Labile blood pressure   Vitals:   11/28/18 1927 11/29/18 0437  BP: (!) 148/64 (!) 153/88  Pulse: 79 79  Resp: 19 19  Temp: 97.6 F (36.4 C) (!) 97.5 F (36.4 C)  SpO2: 96% 95%   13.  Peripheral edema  Lasix 20 started on 2/8, cont KCL  LOS: 9 days A FACE TO FACE EVALUATION WAS PERFORMED  AndCharlett Blake12/2020, 7:45 AM

## 2018-11-30 ENCOUNTER — Inpatient Hospital Stay (HOSPITAL_COMMUNITY): Payer: Medicare Other | Admitting: Physical Therapy

## 2018-11-30 ENCOUNTER — Inpatient Hospital Stay (HOSPITAL_COMMUNITY): Payer: Medicare Other

## 2018-11-30 ENCOUNTER — Ambulatory Visit: Payer: Medicare Other

## 2018-11-30 NOTE — Progress Notes (Signed)
Occupational Therapy Session Note  Patient Details  Name: [name] [name] MRN: [id number] Date of Birth: [birth year]  Today's Date: 11/30/2018 OT Individual Time: 9326-7124 OT Individual Time Calculation (min): 60 min    Short Term Goals: Week 1:  OT Short Term Goal 1 (Week 1): Pt will complete grooming task bimanually with CS to demonstrate improved R UE coordination OT Short Term Goal 1 - Progress (Week 1): Partly met OT Short Term Goal 2 (Week 1): Pt will transfer to toilet with CS OT Short Term Goal 2 - Progress (Week 1): Met OT Short Term Goal 3 (Week 1): Pt will don pants with min A OT Short Term Goal 3 - Progress (Week 1): Met OT Short Term Goal 4 (Week 1): Pt will feed self 1/2 of meal with R UE with 100% accuracy  OT Short Term Goal 4 - Progress (Week 1): Partly met  Skilled Therapeutic Interventions/Progress Updates:    1:1. Pt received in bed reportin good nights sleep and no pain. Pt requesting to finish breakfast. Pt eats EOB with min VC for use of RUE for self feeding and demo RUE as support to open packages and containers. Pt with overal improved functional use of RUE since this clinician saw pt last. Pt completes transfer with min A overall EOB>w/c<>TTB with VC for hand placement. Pt completes bathing at min A level with VC for crossing into figure 4 for washing feet. Pt dresses at sink with mod VC for hemi dressing and positioning into figure 4 for threading BLE. Pt able to thread BLE into pants and underwear. OT provides min A to advance pants apst hips. Pt able to don R shoe with MAX A d/t AFO and L shoe with VC for shoe button use. Exited session with pt seated inw/c, call light in reach and safety belt on awaiting PT session  Therapy Documentation Precautions:  Precautions Precautions: Fall Precaution Comments: R hemi, R knee buckles Restrictions Weight Bearing Restrictions: No General:   Vital Signs: Therapy Vitals Temp: 98 F (36.7 C) Temp Source: Oral Pulse  Rate: 87 Resp: 19 BP: (!) 146/86 Patient Position (if appropriate): Sitting Oxygen Therapy SpO2: 99 % O2 Device: Room Air Pain:    Therapy/Group: Individual Therapy  Tonny Branch 11/30/2018, 8:14 AM

## 2018-11-30 NOTE — Progress Notes (Signed)
[facility] PHYSICAL MEDICINE & REHABILITATION PROGRESS NOTE   Subjective/Complaints:  No issues overnite, slept well  ROS-denies CP, SOB, N/V/D  Objective:   No results found. No results for input(s): WBC, HGB, HCT, PLT in the last 72 hours. Recent Labs    11/27/18 0929  NA 140  K 3.7  CL 108  CO2 22  GLUCOSE 151*  BUN 20  CREATININE 1.07*  CALCIUM 9.1    Intake/Output Summary (Last 24 hours) at 11/30/2018 0751 Last data filed at 11/29/2018 1303 Gross per 24 hour  Intake 360 ml  Output -  Net 360 ml     Physical Exam: Vital Signs Blood pressure (!) 146/86, pulse 87, temperature 98 F (36.7 C), temperature source Oral, resp. rate 19, height 5\' 5"  (1.651 m), weight 68.2 kg, SpO2 99 %. Constitutional: No distress . Vital signs reviewed. HENT: Normocephalic.  Atraumatic.  Poor dentition. Eyes: EOMI. No discharge. Cardiovascular: RRR.  No JVD. Respiratory: CTA bilaterally.  Normal effort. GI: BS +. Non-distended. Musc: Lower extremity edema Neurologic: Alert and oriented Motor: 5/5 LUE proximal to distal 4+-5/5 LLE proximal to distal,  4/5 RUE proximal to distal 4-/5 RLE proximal and distal,Skin: No evidence of breakdown, no evidence of rash Psych: Normal mood.  Normal affect.  Assessment/Plan: 1. Functional deficits secondary to Left corona radiata and posterior MCA infarcts onset 11/18/18 which require 3+ hours per day of interdisciplinary therapy in a comprehensive inpatient rehab setting.  Physiatrist is providing close team supervision and 24 hour management of active medical problems listed below.  Physiatrist and rehab team continue to assess barriers to discharge/monitor patient progress toward functional and medical goals  Care Tool:  Bathing    Body parts bathed by patient: Right arm, Left arm, Chest, Abdomen   Body parts bathed by helper: Buttocks, Right lower leg, Left lower leg     Bathing assist Assist Level: Set up assist     Upper Body  Dressing/Undressing Upper body dressing   What is the patient wearing?: Pull over shirt    Upper body assist Assist Level: Supervision/Verbal cueing    Lower Body Dressing/Undressing Lower body dressing      What is the patient wearing?: Pants     Lower body assist Assist for lower body dressing: Moderate Assistance - Patient 50 - 74%     Toileting Toileting    Toileting assist Assist for toileting: Moderate Assistance - Patient 50 - 74%     Transfers Chair/bed transfer  Transfers assist     Chair/bed transfer assist level: Minimal Assistance - Patient > 75%     Locomotion Ambulation   Ambulation assist      Assist level: Minimal Assistance - Patient > 75% Assistive device: Walker-rolling Max distance: 50 ft   Walk 10 feet activity   Assist     Assist level: Minimal Assistance - Patient > 75% Assistive device: Walker-rolling, Orthosis   Walk 50 feet activity   Assist Walk 50 feet with 2 turns activity did not occur: Safety/medical concerns(R hemiparesis, buckling)  Assist level: Minimal Assistance - Patient > 75% Assistive device: Walker-rolling, Orthosis    Walk 150 feet activity   Assist Walk 150 feet activity did not occur: Safety/medical concerns         Walk 10 feet on uneven surface  activity   Assist Walk 10 feet on uneven surfaces activity did not occur: Safety/medical concerns         Wheelchair     Assist Will patient  use wheelchair at discharge?: Yes Type of Wheelchair: Manual    Wheelchair assist level: Moderate Assistance - Patient 50 - 74% Max wheelchair distance: 30 ft     Wheelchair 50 feet with 2 turns activity    Assist        Assist Level: Moderate Assistance - Patient 50 - 74%   Wheelchair 150 feet activity     Assist Wheelchair 150 feet activity did not occur: Safety/medical concerns   Assist Level: Moderate Assistance - Patient 50 - 74%      Medical Problem List and  Plan: 1.Right side weaknesssecondary to embolicleftterritory MCA infarction.Status post loop recorder Continue CIR PT, OT, SLP  ASA and plavix for 3 weeks then ASA 81mg  alone- on 12/09/2018 2. DVT Prophylaxis/Anticoagulation: subcutaneous Lovenox. Monitor for any bleeding episodes 3. Pain Management:Tylenol as needed 4. Mood:Abilify 5 mg daily, Bipolar disorder  Grounds pass ordered on 2/10 5. Neuropsych: This patientiscapable of making decisions on herown behalf. 6. Skin/Wound Care:Routine skin checks 7. Fluids/Electrolytes/Nutrition:Routine in and out's   BMP ok 2/10 8. Hypothyroidism. Synthroid 9. Hyperlipidemia. Lipitor 10.  Chronic insomnia: Resumed Ambien but at 5mg  11.  Hypokalemia:   Potassium 4.0 on 2/7, repeat 2/10 3.7   Continue KCl 52meq BID 12.  BP control is fair - good    Vitals:   11/29/18 1927 11/30/18 0436  BP: (!) 146/70 (!) 146/86  Pulse: 85 87  Resp: 15 19  Temp: 98.2 F (36.8 C) 98 F (36.7 C)  SpO2: 97% 99%   13.  Peripheral edema- improved  Lasix 20 started on 2/8, cont KCL  LOS: 10 days A FACE TO FACE EVALUATION WAS PERFORMED  Charlett Blake 11/30/2018, 7:51 AM

## 2018-11-30 NOTE — Patient Care Conference (Signed)
Inpatient RehabilitationTeam Conference and Plan of Care Update Date: 11/29/2018   Time: 11:05 AM    Patient Name: [name] [name]      Medical Record Number: [id number]  Date of Birth: 04-07-1933 Sex: Female         Room/Bed: 4W08C/4W08C-01 Payor Info: Payor: MEDICARE / Plan: MEDICARE PART A AND B / Product Type: *No Product type* /    Admitting Diagnosis: cva  Admit Date/Time:  11/20/2018  6:02 PM Admission Comments: No comment available   Primary Diagnosis:  <principal problem not specified> Principal Problem: <principal problem not specified>  Patient Active Problem List   Diagnosis Date Noted  . Right hemiparesis ([place])   . Peripheral edema   . Hypertensive crisis   . Labile blood pressure   . Hypokalemia   . Left middle cerebral artery stroke ([place]) 11/20/2018  . Acute arterial ischemic stroke, multifocal, posterior circulation, left ([place]) 11/19/2018  . Stroke ([place]) 11/18/2018  . Stroke (cerebrum) ([place]) 11/18/2018  . Myalgia 08/06/2018  . Left lower quadrant pain 08/29/2017  . Dyspepsia 03/25/2017  . Atherosclerosis of aorta ([place]) 09/27/2016  . COPD with acute bronchitis ([place]) 09/27/2016  . Acute blood loss anemia 03/16/2015  . Osteoarthritis of right knee 03/04/2015  . Status post total right knee replacement 03/04/2015  . Contusion of knee, right 10/23/2014  . IBS 02/23/2010  . HEMORRHOIDS, EXTERNAL 01/01/2010  . HEMATURIA UNSPECIFIED 11/04/2009  . NAUSEA 06/02/2009  . URINARY INCONTINENCE, STRESS, FEMALE 05/09/2009  . Acute sinusitis 11/25/2008  . UTI 04/23/2008  . COLONIC POLYPS 12/21/2007  . Bipolar disorder ([place]) 12/21/2007  . DEVIATED NASAL SEPTUM 12/21/2007  . HERNIA, VENTRAL 12/21/2007  . Osteoarthritis 12/21/2007  . Hypothyroidism 09/01/2007  . ECZEMA 09/01/2007  . Psoriasis 09/01/2007  . Osteoporosis 09/01/2007  . Insomnia 09/01/2007    Expected Discharge Date: Expected Discharge Date: 12/08/18  Team Members Present: Physician leading conference:  Dr. Alysia Penna Social Worker Present: Alfonse Alpers, LCSW Nurse Present: Rayetta Pigg, RN PT Present: Michaelene Song, PT OT Present: Willeen Cass, OT SLP Present: Weston Anna, SLP PPS Coordinator present : Gunnar Fusi     Current Status/Progress Goal Weekly Team Focus  Medical   Sleep no longer an issue, fatigues easily still has right-sided weakness  Maintain medical stability reduce fall risk reduce readmission risk  Monitor blood pressure determine whether medication adjustments will be made   Bowel/Bladder   Continent of B/B; LBM 11/28/2018  Remain continent of B/B; maintain regular bowel pattern  Assist as needed   Swallow/Nutrition/ Hydration             ADL's   Bathing-CGA/Supervision; dressing-supervision/min A; footwear-maxA; functional transfers-min A; RUE ataxic and decreased strength proxomal to distal Supervision overall  BADL retraining, functional transfers, standing balance, RUE NMR, safety awareness, education   Mobility   supervision bed mobility, min assist sit<>stand and transfers, mod assist gait 30 ft with RW  supervision.  R NMR, balance, gait, strength, knee control   Communication             Safety/Cognition/ Behavioral Observations            Pain   No c/o pain  Remain free of pain.   Assess q shift and prn   Skin   No skin breakdown/infection  Maintain skin integrity  Assess q shift and prn    Rehab Goals Patient on target to meet rehab goals: Yes Rehab Goals Revised: none *See Care Plan and progress notes for long  and short-term goals.     Barriers to Discharge  Current Status/Progress Possible Resolutions Date Resolved   Physician    Medical stability     Progressing with therapy  Continue rehab program, see above      Nursing                  PT                    OT                  SLP                SW                Discharge Planning/Teaching Needs:  Pt to return to her home with her son, dtr-in-law, and  paid caregiver (if needed).  Son will come for family education next week before d/c.   Team Discussion:  Dr. Letta Pate stated that pt's blood pressure is up and down, but not terrible.  He started lasix and supplemented some potassium.  Pt is continent of bowel and bladder.  Her right knee is buckling.  Pt can feed herself once she is set up for meals and with built up utensils.  Pt is min A in the bathroom, mod A for LB bathing and dressing and she needs extra time.  Pt is supervision with bed mobility and min A for txs and mod A for gait due to right leg, needs cueing for every step.  Revisions to Treatment Plan:  none    Continued Need for Acute Rehabilitation Level of Care: The patient requires daily medical management by a physician with specialized training in physical medicine and rehabilitation for the following conditions: Daily direction of a multidisciplinary physical rehabilitation program to ensure safe treatment while eliciting the highest outcome that is of practical value to the patient.: Yes Daily medical management of patient stability for increased activity during participation in an intensive rehabilitation regime.: Yes Daily analysis of laboratory values and/or radiology reports with any subsequent need for medication adjustment of medical intervention for : Nutritional problems;Blood pressure problems   I attest that I was present, lead the team conference, and concur with the assessment and plan of the team.   Jarika Robben, Silvestre Mesi 11/30/2018, 10:40 PM

## 2018-11-30 NOTE — Progress Notes (Signed)
Social Work Patient ID: [name] [name], female   DOB: 12-30-1932, 85 y.o.   MRN: [id number]   CSW met with pt and pt's son 11-29-18 to update them on team conference discussion.  Pt and son were pleased that pt is on track for 12-08-18 d/c.  CSW talked with son about family education next week and reinforced the recommendation for pt to have supervision at home for a while.  CSW also discussed DME and HH for therapies.  CSW will take care of this next week closer to pt's d/c.  CSW will continue to follow and assist as needed.

## 2018-11-30 NOTE — Progress Notes (Addendum)
Physical Therapy Session Note  Patient Details  Name: [name] [name] MRN: [id number] Date of Birth: 12/15/1932  Today's Date: 11/30/2018 PT Individual Time: 0915-0955 AND 1100-1155 AND 1515-1600 PT Individual Time Calculation (min): 40 min AND 55 min AND AND 45 min  Short Term Goals: Week 2:  PT Short Term Goal 1 (Week 2): STG=LTG due to ELOS  Skilled Therapeutic Interventions/Progress Updates:   Session 1:  Pt in w/c and agreeable to therapy, no c/o pain. Requesting to brush teeth before leaving room for therapy. Set-up assist at sink for teeth brushing and increased time, pt utilizing both UEs. Total assist w/c transport to/from therapy gym. Worked on Personnel officer and RW management w/ gait this session. Ambulated 11' x2 w/ RW and primarily CGA, occasional manual assist for lateral weight shifting and RW management. Min assist needed at turns for RW management and verbal and visual cues for technique. Worked on side-stepping at rail to improve side steps needed for turning. Side-stepped in both directions, CGA w/ max verbal and visual cues for correct technique to isolate hip abductor musculature. Returned to room via w/c, ended session in w/c and all needs in reach.   Session 2:  Pt in w/c and agreeable to therapy, no c/o pain. Total assist w/c transport to/from therapy gym. NuStep 8 min @ level 3 w/ all extremities to work on R side muscle activation, reciprocal movement pattern, and neutral RLE alignment. Sit<>stands from elevated mat w/o UE assist and LUE over RUE, 2x5 reps. Min assist w/ emphasis on increasing RLE WB. Standing partial knee bends w/o UE assist, 3x10 reps w/ beige theraband providing tactile cues to bring knees to neutral abduction. Pt reporting she needed to urgently toilet. Returned to room and CGA toilet transfer w/ min assist for LE garment management. Supervision to wash hands prior to returning to therapy gym. Worked on isolating R hip activation w/ isometric activation  in seated, 3x10 reps w/ L hip providing resistance via orange theraband. Returned to room via w/c, ended session in w/c w/ all needs in reach. Set-up w/ lunch tray.   Session 3: Pt in w/c and agreeable to therapy, no c/o pain. Total assist w/c transport to/from therapy gym. Son present to observe therapies. Ambulated 86' x2 w/ RW and CGA-min assist overall w/ improved foot clearance compared to morning session. Verbal cues for gait pattern throughout. Pt eager to work on things outside of therapy. Provided w/ gait belt for R gastroc stretching and instructed on ankle pumps. Pt returned demonstration correctly. Returned to room via w/c and ended session in w/c, all needs in reach.   Pt very concerned about her d/c date being only a week away, she does not feel she will be ready. Provided extensive education regarding her CLOF and expected progress over next week compared to progress she has recently made. Discussed realistic long term expectations including needing some assist at home for some time and that it may be a while until she gets more independence back. Son in agreement w/ all recommendations and reassured pt that he and his wife will help her with anything she needs including providing 24/7 supervision/assist if needed. Pt felt better after conversation.   Therapy Documentation Precautions:  Precautions Precautions: Fall Precaution Comments: R hemi, R knee buckles Restrictions Weight Bearing Restrictions: No  Therapy/Group: Individual Therapy  Wilian Kwong K [name] 11/30/2018, 10:03 AM

## 2018-12-01 ENCOUNTER — Inpatient Hospital Stay (HOSPITAL_COMMUNITY): Payer: Medicare Other

## 2018-12-01 ENCOUNTER — Inpatient Hospital Stay (HOSPITAL_COMMUNITY): Payer: Medicare Other | Admitting: Physical Therapy

## 2018-12-01 NOTE — Progress Notes (Signed)
Occupational Therapy Session Note  Patient Details  Name: [name] [name] MRN: [id number] Date of Birth: 1933/03/12  Today's Date: 12/01/2018 OT Individual Time: 0700-0725 OT Individual Time Calculation (min): 25 min    Short Term Goals: Week 1:  OT Short Term Goal 1 (Week 1): Pt will complete grooming task bimanually with CS to demonstrate improved R UE coordination OT Short Term Goal 1 - Progress (Week 1): Partly met OT Short Term Goal 2 (Week 1): Pt will transfer to toilet with CS OT Short Term Goal 2 - Progress (Week 1): Met OT Short Term Goal 3 (Week 1): Pt will don pants with min A OT Short Term Goal 3 - Progress (Week 1): Met OT Short Term Goal 4 (Week 1): Pt will feed self 1/2 of meal with R UE with 100% accuracy  OT Short Term Goal 4 - Progress (Week 1): Partly met  Skilled Therapeutic Interventions/Progress Updates:    Pt resting in bed upon arrival.  OT intervention with focus on bed mobility, sit<>stand, unsupported sitting balance, and activity tolerance to increase independence with BADLs.  Pt did not want to get OOB before breakfast. Pt requires min A for be mobility and sit<>stand from EOB.  Pt returned to bed to await breakfast.  Bed alarm activated.   Therapy Documentation Precautions:  Precautions Precautions: Fall Precaution Comments: R hemi, R knee buckles Restrictions Weight Bearing Restrictions: (P) No  Pain: Pain Assessment Pain Scale: 0-10 Pain Score: 0-No pain  Therapy/Group: Individual Therapy  Leroy Libman 12/01/2018, 11:00 AM

## 2018-12-01 NOTE — Progress Notes (Signed)
Physical Therapy Session Note  Patient Details  Name: DRISANA SCHWEICKERT MRN: [id number] Date of Birth: 11/29/1932  Today's Date: 12/01/2018 PT Individual Time: 0800-0900 AND 1100-1155 PT Individual Time Calculation (min): 60 min AND 55 min  Short Term Goals: Week 2:  PT Short Term Goal 1 (Week 2): STG=LTG due to ELOS  Skilled Therapeutic Interventions/Progress Updates:   Session 1:  Pt in supine and agreeable to therapy, no c/o pain. Session focused on self-care tasks and functional mobility. Min assist transfer to EOB and donned clothing from seated position. Set-up assist for shirt, min assist to thread pants and mod assist to pull over hips once in standing. Min assist transfer to w/c and set-up assist for hygiene tasks at sink, pt declined bathing this session as she took a shower yesterday. L hand-over-R hand technique to wash face, brush teeth, and apply face cream. Total assist w/c transport to therapy gym for time management. Worked on standing postural control while performing bimanual tasks. Verbal and tactile cues for weight shifting over balls of feet, pt tends to lose balance posteriorly. Min assist fading to close supervision w/ prolonged standing, intermittent seated rest breaks 2/2 fatigue. Occasional tactile cues for R quad activation in stance. Encouraged pt to utilize RUE as much as possible during bimanual tasks such as pipe tree and ped board. Ended session in w/c in therapy gym, awaiting next therapy session and in care of staff.  Session 2: Pt in w/c and agreeable to therapy, no c/o pain. Session focused on gait training and RLE NMR. Total assist w/c transport to/from therapy gym. Ambulated in 40-60' bouts x5 reps w/ CGA to min assist. Focused on achieving full R knee extension in single leg stance (SLS). RLE flexed in SLS up to 80-90% of the time w/ fatigue and pt needed max tactile, verbal, and manual cues to correct. No knee buckling, however. Pt visually appearing to get  adequate L weight shift during gait, measured BLEs for leg length discrepancy and difference was negligible. Added in L heel wedge w/ improved R knee extension during gait, knee staying flexed in stance 10-20% of the time w/ heel wedge - including towards end of session w/ fatigue. Returned to room via w/c and ended session in w/c, all needs in reach and set-up for lunch.  Therapy Documentation Precautions:  Precautions Precautions: Fall Precaution Comments: R hemi, R knee buckles Restrictions Weight Bearing Restrictions: (P) No Vital Signs:  Pain: Pain Assessment Pain Scale: 0-10 Pain Score: 0-No pain  Therapy/Group: Individual Therapy  Mitchell Epling K Cheo Selvey 12/01/2018, 9:01 AM

## 2018-12-01 NOTE — Telephone Encounter (Signed)
Spoke with Santiago Glad, patient's RN on [phone]. She agrees to send a manual [facility] transmission for review. Dr. Lovena Le reviewed available "AF" ECGs--episodes show SR w/PACs, not true AF.

## 2018-12-01 NOTE — Telephone Encounter (Signed)
AF episode # 25 ST w/ ectopy per GT

## 2018-12-01 NOTE — Progress Notes (Signed)
Occupational Therapy Session Note  Patient Details  Name: [name] [name] MRN: [id number] Date of Birth: Feb 16, 1933  Today's Date: 12/01/2018 OT Individual Time: 0900-1000 OT Individual Time Calculation (min): 60 min    Short Term Goals: Week 1:  OT Short Term Goal 1 (Week 1): Pt will complete grooming task bimanually with CS to demonstrate improved R UE coordination OT Short Term Goal 1 - Progress (Week 1): Partly met OT Short Term Goal 2 (Week 1): Pt will transfer to toilet with CS OT Short Term Goal 2 - Progress (Week 1): Met OT Short Term Goal 3 (Week 1): Pt will don pants with min A OT Short Term Goal 3 - Progress (Week 1): Met OT Short Term Goal 4 (Week 1): Pt will feed self 1/2 of meal with R UE with 100% accuracy  OT Short Term Goal 4 - Progress (Week 1): Partly met  Skilled Therapeutic Interventions/Progress Updates:    OT intervention with focus on sit<>stand, standing balance, RUE functional use, activity tolerance, functional transfers, and safety awareness to increase independence with BADLs. Pt performed stand pivot transfer with RW to standard chair for table tasks grasp/release of small objects. Pt with slight dysmetria when reaching for objects. Pt also engaged with retrieiving cards from card board and replacing while seated and standing.  Pt engaged in card game with focus on holding cards in R hand.  Pt able to hold cards properly but UE weakness noted after holding cards >30 seconds and required max verbal cues to correct. Pt returned to room and remained in w/c with all needs wihtin reach and belt alarm activated.   Therapy Documentation Precautions:  Precautions Precautions: Fall Precaution Comments: R hemi, R knee buckles Restrictions Weight Bearing Restrictions: (P) No Pain: Pain Assessment Pain Scale: 0-10 Pain Score: 0-No pain   Therapy/Group: Individual Therapy  Leroy Libman 12/01/2018, 11:01 AM

## 2018-12-01 NOTE — Progress Notes (Signed)
Manual transmission of loop recorder completed

## 2018-12-01 NOTE — Progress Notes (Signed)
[place] PHYSICAL MEDICINE & REHABILITATION PROGRESS NOTE   Subjective/Complaints:  Seen during PT, no new issues overnite  ROS-denies CP, SOB, N/V/D  Objective:   No results found. No results for input(s): WBC, HGB, HCT, PLT in the last 72 hours. No results for input(s): NA, K, CL, CO2, GLUCOSE, BUN, CREATININE, CALCIUM in the last 72 hours.  Intake/Output Summary (Last 24 hours) at 12/01/2018 0840 Last data filed at 11/30/2018 1700 Gross per 24 hour  Intake 360 ml  Output -  Net 360 ml     Physical Exam: Vital Signs Blood pressure (!) 155/78, pulse 86, temperature 97.9 F (36.6 C), temperature source Oral, resp. rate 17, height 5\' 5"  (1.651 m), weight 68.2 kg, SpO2 95 %. Constitutional: No distress . Vital signs reviewed. HENT: Normocephalic.  Atraumatic.  Poor dentition. Eyes: EOMI. No discharge. Cardiovascular: RRR.  No JVD. Respiratory: CTA bilaterally.  Normal effort. GI: BS +. Non-distended. Musc: Lower extremity edema Neurologic: Alert and oriented Motor: 5/5 LUE proximal to distal 4+-5/5 LLE proximal to distal,  4/5 RUE proximal to distal 4-/5 RLE proximal and distal,Skin: No evidence of breakdown, no evidence of rash Psych: Normal mood.  Normal affect.  Assessment/Plan: 1. Functional deficits secondary to Left corona radiata and posterior MCA infarcts onset 11/18/18 which require 3+ hours per day of interdisciplinary therapy in a comprehensive inpatient rehab setting.  Physiatrist is providing close team supervision and 24 hour management of active medical problems listed below.  Physiatrist and rehab team continue to assess barriers to discharge/monitor patient progress toward functional and medical goals  Care Tool:  Bathing    Body parts bathed by patient: Right arm, Left arm, Chest, Abdomen, Front perineal area, Buttocks, Right upper leg, Left upper leg, Right lower leg, Left lower leg, Face   Body parts bathed by helper: Buttocks, Right lower leg,  Left lower leg     Bathing assist Assist Level: Supervision/Verbal cueing     Upper Body Dressing/Undressing Upper body dressing   What is the patient wearing?: Pull over shirt    Upper body assist Assist Level: Supervision/Verbal cueing    Lower Body Dressing/Undressing Lower body dressing      What is the patient wearing?: Underwear/pull up, Pants     Lower body assist Assist for lower body dressing: Minimal Assistance - Patient > 75%     Toileting Toileting    Toileting assist Assist for toileting: Moderate Assistance - Patient 50 - 74%     Transfers Chair/bed transfer  Transfers assist     Chair/bed transfer assist level: Minimal Assistance - Patient > 75%     Locomotion Ambulation   Ambulation assist      Assist level: Minimal Assistance - Patient > 75% Assistive device: Walker-rolling Max distance: 40'   Walk 10 feet activity   Assist     Assist level: Minimal Assistance - Patient > 75% Assistive device: Walker-rolling   Walk 50 feet activity   Assist Walk 50 feet with 2 turns activity did not occur: Safety/medical concerns(R hemiparesis, buckling)  Assist level: Minimal Assistance - Patient > 75% Assistive device: Walker-rolling, Orthosis    Walk 150 feet activity   Assist Walk 150 feet activity did not occur: Safety/medical concerns         Walk 10 feet on uneven surface  activity   Assist Walk 10 feet on uneven surfaces activity did not occur: Safety/medical concerns         Wheelchair     Assist Will  patient use wheelchair at discharge?: Yes Type of Wheelchair: Manual    Wheelchair assist level: Minimal Assistance - Patient > 75% Max wheelchair distance: 66'    Wheelchair 50 feet with 2 turns activity    Assist        Assist Level: Minimal Assistance - Patient > 75%   Wheelchair 150 feet activity     Assist Wheelchair 150 feet activity did not occur: Safety/medical concerns   Assist Level:  Moderate Assistance - Patient 50 - 74%      Medical Problem List and Plan: 1.Right side weaknesssecondary to embolicleftterritory MCA infarction.Status post loop recorder Continue CIR PT, OT, SLP  ASA and plavix for 3 weeks then ASA 81mg  alone- on 12/09/2018 2. DVT Prophylaxis/Anticoagulation: subcutaneous Lovenox. Monitor for any bleeding episodes 3. Pain Management:Tylenol as needed 4. Mood:Abilify 5 mg daily, Bipolar disorder  Grounds pass ordered on 2/10 5. Neuropsych: This patientiscapable of making decisions on herown behalf. 6. Skin/Wound Care:Routine skin checks 7. Fluids/Electrolytes/Nutrition:Routine in and out's   BMP ok 2/10 8. Hypothyroidism. Synthroid 9. Hyperlipidemia. Lipitor 10.  Chronic insomnia: Resumed Ambien but at 5mg  11.  Hypokalemia:   Potassium 4.0 on 2/7, repeat 2/10 3.7   Continue KCl 1meq BID 12.  BP control is fair - good    Vitals:   11/30/18 1951 12/01/18 0440  BP: (!) 165/80 (!) 155/78  Pulse: 79 86  Resp: 16 17  Temp: 97.9 F (36.6 C) 97.9 F (36.6 C)  SpO2: 98% 95%   13.  Peripheral edema- improved  Lasix 20 started on 2/8, cont KCL  LOS: 11 days A FACE TO FACE EVALUATION WAS PERFORMED  Charlett Blake 12/01/2018, 8:40 AM

## 2018-12-02 ENCOUNTER — Inpatient Hospital Stay (HOSPITAL_COMMUNITY): Payer: Medicare Other | Admitting: Occupational Therapy

## 2018-12-02 ENCOUNTER — Inpatient Hospital Stay (HOSPITAL_COMMUNITY): Payer: Medicare Other | Admitting: Physical Therapy

## 2018-12-02 NOTE — Progress Notes (Signed)
[place] PHYSICAL MEDICINE & REHABILITATION PROGRESS NOTE   Subjective/Complaints:  States she didn't sleep well. Took her sleeping medication as ordered. Denies pain or restless/anxiety. Not sure why  ROS: Patient denies fever, rash, sore throat, blurred vision, nausea, vomiting, diarrhea, cough, shortness of breath or chest pain, joint or back pain, headache, or mood change.    Objective:   No results found. No results for input(s): WBC, HGB, HCT, PLT in the last 72 hours. No results for input(s): NA, K, CL, CO2, GLUCOSE, BUN, CREATININE, CALCIUM in the last 72 hours.  Intake/Output Summary (Last 24 hours) at 12/02/2018 0913 Last data filed at 12/01/2018 1817 Gross per 24 hour  Intake 462 ml  Output -  Net 462 ml     Physical Exam: Vital Signs Blood pressure 134/85, pulse 82, temperature 97.9 F (36.6 C), temperature source Oral, resp. rate 20, height 5\' 5"  (1.651 m), weight 68.2 kg, SpO2 97 %. Constitutional: No distress . Vital signs reviewed. HEENT: EOMI, oral membranes moist Neck: supple Cardiovascular: RRR without murmur. No JVD    Respiratory: CTA Bilaterally without wheezes or rales. Normal effort    GI: BS +, non-tender, non-distended  Musc: Lower extremity edema Neurologic: Alert and oriented Motor: 5/5 LUE proximal to distal 4+-5/5 LLE proximal to distal,  4/5 RUE proximal to distal 4-/5 RLE proximal and distal---no motor changes Skin: No evidence of breakdown, no evidence of rash Psych: pleasant.  Assessment/Plan: 1. Functional deficits secondary to Left corona radiata and posterior MCA infarcts onset 11/18/18 which require 3+ hours per day of interdisciplinary therapy in a comprehensive inpatient rehab setting.  Physiatrist is providing close team supervision and 24 hour management of active medical problems listed below.  Physiatrist and rehab team continue to assess barriers to discharge/monitor patient progress toward functional and medical  goals  Care Tool:  Bathing    Body parts bathed by patient: Right arm, Left arm, Chest, Abdomen, Front perineal area, Buttocks, Right upper leg, Left upper leg, Right lower leg, Left lower leg, Face   Body parts bathed by helper: Buttocks, Right lower leg, Left lower leg     Bathing assist Assist Level: Supervision/Verbal cueing     Upper Body Dressing/Undressing Upper body dressing   What is the patient wearing?: Pull over shirt    Upper body assist Assist Level: Supervision/Verbal cueing    Lower Body Dressing/Undressing Lower body dressing      What is the patient wearing?: Underwear/pull up, Pants     Lower body assist Assist for lower body dressing: Minimal Assistance - Patient > 75%     Toileting Toileting    Toileting assist Assist for toileting: Moderate Assistance - Patient 50 - 74%     Transfers Chair/bed transfer  Transfers assist     Chair/bed transfer assist level: Minimal Assistance - Patient > 75%     Locomotion Ambulation   Ambulation assist      Assist level: Minimal Assistance - Patient > 75% Assistive device: Walker-rolling Max distance: 60'   Walk 10 feet activity   Assist     Assist level: Minimal Assistance - Patient > 75% Assistive device: Walker-rolling   Walk 50 feet activity   Assist Walk 50 feet with 2 turns activity did not occur: Safety/medical concerns(R hemiparesis, buckling)  Assist level: Minimal Assistance - Patient > 75% Assistive device: Walker-rolling    Walk 150 feet activity   Assist Walk 150 feet activity did not occur: Safety/medical concerns  Walk 10 feet on uneven surface  activity   Assist Walk 10 feet on uneven surfaces activity did not occur: Safety/medical concerns         Wheelchair     Assist Will patient use wheelchair at discharge?: Yes Type of Wheelchair: Manual    Wheelchair assist level: Minimal Assistance - Patient > 75% Max wheelchair distance: 24'     Wheelchair 50 feet with 2 turns activity    Assist        Assist Level: Minimal Assistance - Patient > 75%   Wheelchair 150 feet activity     Assist Wheelchair 150 feet activity did not occur: Safety/medical concerns   Assist Level: Moderate Assistance - Patient 50 - 74%      Medical Problem List and Plan: 1.Right side weaknesssecondary to embolicleftterritory MCA infarction.Status post loop recorder Continue CIR PT, OT, SLP  ASA and plavix for 3 weeks then ASA 81mg  alone- on 12/09/2018 2. DVT Prophylaxis/Anticoagulation: subcutaneous Lovenox. Monitor for any bleeding episodes 3. Pain Management:Tylenol as needed 4. Mood:Abilify 5 mg daily, Bipolar disorder  Grounds pass ordered on 2/10 5. Neuropsych: This patientiscapable of making decisions on herown behalf. 6. Skin/Wound Care:Routine skin checks 7. Fluids/Electrolytes/Nutrition:Routine in and out's   BMP ok 2/10 8. Hypothyroidism. Synthroid 9. Hyperlipidemia. Lipitor 10.  Chronic insomnia: Resumed Ambien but at 5mg  11.  Hypokalemia:   Potassium 4.0 on 2/7, repeat 2/10 3.7   Continue KCl 8meq BID 12.  BP control is fair  To borderline ---no changes 2/15    Vitals:   12/01/18 1938 12/02/18 0415  BP: (!) 164/86 134/85  Pulse: 77 82  Resp: 17 20  Temp: 98.3 F (36.8 C) 97.9 F (36.6 C)  SpO2: 99% 97%   13.  Peripheral edema- improved  Lasix 20 started on 2/8, cont KCL  LOS: 12 days A FACE TO FACE EVALUATION WAS PERFORMED  Meredith Staggers 12/02/2018, 9:13 AM

## 2018-12-02 NOTE — Progress Notes (Signed)
Physical Therapy Session Note  Patient Details  Name: [name] [name] MRN: [id number] Date of Birth: [birth year]  Today's Date: 12/02/2018 PT Individual Time: 1005-1059 PT Individual Time Calculation (min): 54 min   Short Term Goals: Week 2:  PT Short Term Goal 1 (Week 2): STG=LTG due to ELOS  Skilled Therapeutic Interventions/Progress Updates:   Pt in supine and agreeable to therapy, no c/o pain. Session focused on household mobility. Transferred to EOB w/ min assist and ambulated to/from toilet in bathroom w/ CGA-min assist. Verbal cues for RW management and tactile cues for postural control. Pt managing w/o RAFO well, verbal cues for gait pattern only. CGA for toilet transfer and verbal cues for technique on how to pull up brief herself. Pt states she can likely get a w/c into her bathroom if need be. Dressed from seated level w/ min assist and pt performed self-care tasks at sink w/ set-up assist. Total assist to don TEDs and shoes. Worked on w/c propulsion as pt self-propelled w/c to day room w/ BUEs and LLE. Supervision w/ occasional verbal cues for technique. Educated pt on being able to perform this herself for increased independence when at home. Worked on gait training, ambulated 37' in household environment w/ RW. Verbal cues for gait pattern, occasional tactile cues for increased R knee extension in single leg stance. Pt continues to ambulate w/ R knee in flexed posture throughout gait cycle, little carryover from yesterday's session. Pt w/ cramp in R gastroc after gait, relieved by removing shoe and RAFO. Pt w/ increased R ankle gastroc tightness and reminded to be performing her self-stretching w/ the gait belt in her room. Returned to room and ended session in w/c, all needs in reach.   Therapy Documentation Precautions:  Precautions Precautions: Fall Precaution Comments: R hemi, R knee buckles Restrictions Weight Bearing Restrictions: No  Therapy/Group: Individual Therapy  Iysha Mishkin  Clent Demark 12/02/2018, 12:11 PM

## 2018-12-02 NOTE — Progress Notes (Signed)
Occupational Therapy Session Note  Patient Details  Name: [name] [name] MRN: [id number] Date of Birth: [birth year]  Today's Date: 12/02/2018 OT Individual Time: 4492-0100 OT Individual Time Calculation (min): 33 min   Skilled Therapeutic Interventions/Progress Updates:    Pt greeted in w/c with no c/o pain. Son [name] present to observe session. Started tx with pt putting on her sneakers including Rt shoe orthotic. We tried adaptive techniques including propping LEs up on bed and footstool. Pt ultimately required Max A to place Rt foot inside of shoe. Able to push foot down into sneaker once leg was placed on ground. Figure 4 utilized for donning Lt shoe. She fastened laces and shoe-button. Pt would benefit from additional practice to increase independence with donning footwear. Next escorted pt via w/c to dayroom. Worked on standing balance and Rt NMR via table washing and side-stepping towards Rt>Lt. Mod A for sit<stand at elevated table. She had trouble side-stepping due decreased R LE proprioception and inability to visually attend. Provided her with tactile and manual cues to improve base of support. Tactile cues for keeping Rt knee extended. Steady assist for balance. Pt washed table using R UE, bending outside of base of support with cuing. At end of session pt returned to w/c and was escorted back to room. Pt left in w/c with all needs and safety belt fastened.   Therapy Documentation Precautions:  Precautions Precautions: Fall Precaution Comments: R hemi, R knee buckles Restrictions Weight Bearing Restrictions: No Pain: No c/o pain during tx   ADL: ADL Eating: Minimal assistance Grooming: Minimal assistance Where Assessed-Grooming: Wheelchair Upper Body Bathing: Minimal cueing, Minimal assistance Where Assessed-Upper Body Bathing: Shower Lower Body Bathing: Moderate assistance, Minimal cueing Where Assessed-Lower Body Bathing: Shower Where Assessed-Upper Body Dressing:  Wheelchair Lower Body Dressing: Maximal assistance, Minimal cueing Where Assessed-Lower Body Dressing: Wheelchair Toileting: Moderate assistance Where Assessed-Toileting: Glass blower/designer: Moderate assistance, Moderate verbal cueing Toilet Transfer Method: Stand pivot Toilet Transfer Equipment: Energy manager: Moderate assistance, Minimal cueing Social research officer, government Method: Stand Paediatric nurse: Shower seat with back, Grab bars :     Therapy/Group: Individual Therapy  Antonio Creswell A Miguelangel Korn 12/02/2018, 4:10 PM

## 2018-12-03 ENCOUNTER — Inpatient Hospital Stay (HOSPITAL_COMMUNITY): Payer: Medicare Other

## 2018-12-03 NOTE — Progress Notes (Signed)
[facility] PHYSICAL MEDICINE & REHABILITATION PROGRESS NOTE   Subjective/Complaints:  Had a much better night.  Slept well.  Denies any new problems this morning.  ROS: Patient denies fever, rash, sore throat, blurred vision, nausea, vomiting, diarrhea, cough, shortness of breath or chest pain, joint or back pain, headache, or mood change.    Objective:   No results found. No results for input(s): WBC, HGB, HCT, PLT in the last 72 hours. No results for input(s): NA, K, CL, CO2, GLUCOSE, BUN, CREATININE, CALCIUM in the last 72 hours.  Intake/Output Summary (Last 24 hours) at 12/03/2018 0837 Last data filed at 12/02/2018 1846 Gross per 24 hour  Intake 120 ml  Output -  Net 120 ml     Physical Exam: Vital Signs Blood pressure 138/77, pulse 77, temperature 97.9 F (36.6 C), resp. rate 18, height 5\' 5"  (1.651 m), weight 68.2 kg, SpO2 96 %. Constitutional: No distress . Vital signs reviewed. HEENT: EOMI, oral membranes moist Neck: supple Cardiovascular: RRR without murmur. No JVD    Respiratory: CTA Bilaterally without wheezes or rales. Normal effort    GI: BS +, non-tender, non-distended  Musc: Lower extremity edema Neurologic: Alert and oriented Motor: 5/5 LUE proximal to distal 4+-5/5 LLE proximal to distal,  4/5 RUE proximal to distal 4-/5 RLE proximal and distal--- stable exam Skin: No evidence of breakdown, no evidence of rash Psych: pleasant.  Assessment/Plan: 1. Functional deficits secondary to Left corona radiata and posterior MCA infarcts onset 11/18/18 which require 3+ hours per day of interdisciplinary therapy in a comprehensive inpatient rehab setting.  Physiatrist is providing close team supervision and 24 hour management of active medical problems listed below.  Physiatrist and rehab team continue to assess barriers to discharge/monitor patient progress toward functional and medical goals  Care Tool:  Bathing    Body parts bathed by patient: Right arm, Left  arm, Chest, Abdomen, Front perineal area, Buttocks, Right upper leg, Left upper leg, Right lower leg, Left lower leg, Face   Body parts bathed by helper: Buttocks, Right lower leg, Left lower leg     Bathing assist Assist Level: Supervision/Verbal cueing     Upper Body Dressing/Undressing Upper body dressing   What is the patient wearing?: Pull over shirt    Upper body assist Assist Level: Supervision/Verbal cueing    Lower Body Dressing/Undressing Lower body dressing      What is the patient wearing?: Underwear/pull up, Pants     Lower body assist Assist for lower body dressing: Minimal Assistance - Patient > 75%     Toileting Toileting Toileting Activity did not occur Landscape architect and hygiene only): Refused  Toileting assist Assist for toileting: Moderate Assistance - Patient 50 - 74%     Transfers Chair/bed transfer  Transfers assist     Chair/bed transfer assist level: Moderate Assistance - Patient 50 - 74%     Locomotion Ambulation   Ambulation assist      Assist level: Minimal Assistance - Patient > 75% Assistive device: Walker-rolling Max distance: 50'   Walk 10 feet activity   Assist     Assist level: Minimal Assistance - Patient > 75% Assistive device: Walker-rolling   Walk 50 feet activity   Assist Walk 50 feet with 2 turns activity did not occur: Safety/medical concerns(R hemiparesis, buckling)  Assist level: Minimal Assistance - Patient > 75% Assistive device: Walker-rolling    Walk 150 feet activity   Assist Walk 150 feet activity did not occur: Safety/medical concerns  Walk 10 feet on uneven surface  activity   Assist Walk 10 feet on uneven surfaces activity did not occur: Safety/medical concerns         Wheelchair     Assist Will patient use wheelchair at discharge?: Yes Type of Wheelchair: Manual    Wheelchair assist level: Supervision/Verbal cueing Max wheelchair distance: 100'     Wheelchair 50 feet with 2 turns activity    Assist        Assist Level: Supervision/Verbal cueing   Wheelchair 150 feet activity     Assist Wheelchair 150 feet activity did not occur: Safety/medical concerns   Assist Level: Moderate Assistance - Patient 50 - 74%      Medical Problem List and Plan: 1.Right side weaknesssecondary to embolicleftterritory MCA infarction.Status post loop recorder Continue CIR PT, OT, SLP  ASA and plavix for 3 weeks then ASA 81mg  alone- on 12/09/2018 2. DVT Prophylaxis/Anticoagulation: subcutaneous Lovenox. Monitor for any bleeding episodes 3. Pain Management:Tylenol as needed 4. Mood:Abilify 5 mg daily, Bipolar disorder  Grounds pass ordered on 2/10 5. Neuropsych: This patientiscapable of making decisions on herown behalf. 6. Skin/Wound Care:Routine skin checks 7. Fluids/Electrolytes/Nutrition:Routine in and out's   BMP ok 2/10 8. Hypothyroidism. Synthroid 9. Hyperlipidemia. Lipitor 10.  Chronic insomnia: Resumed Ambien but at 5mg  11.  Hypokalemia:   Potassium 4.0 on 2/7, repeat 2/10 3.7   Continue KCl 28meq BID 12.  BP control reasonable 2/16    Vitals:   12/02/18 1921 12/03/18 0419  BP: (!) 145/75 138/77  Pulse: 78 77  Resp: 19 18  Temp: 98.2 F (36.8 C) 97.9 F (36.6 C)  SpO2: 99% 96%   13.  Peripheral edema- improved  Lasix 20 started on 2/8, cont KCL  -Follow-up electrolytes next week  LOS: 13 days A FACE TO [place]ley T  12/03/2018, 8:37 AM

## 2018-12-03 NOTE — Progress Notes (Signed)
Physical Therapy Session Note  Patient Details  Name: [name] [name] MRN: [id number] Date of Birth: 1933-03-15  Today's Date: 12/03/2018 PT Individual Time: 719 088 0804 and 1600- 1655 PT Individual Time Calculation (min): 45 min and 55 min   Short Term Goals: Week 2:  PT Short Term Goal 1 (Week 2): STG=LTG due to ELOS  Skilled Therapeutic Interventions/Progress Updates:    Session 1: Pt seated in w/c upon PT arrival, agreeable to therapy tx and denies pain. Pt transported to the gym. Pt performed stand pivot to mat with min assist. Pt performed 2 x 10 sit<>stands this session for LE strengthening and knee control, emphasis on symmetric LE weightbearing. Pt performed x 10 mini squats this session for LE strengthening and knee control, verbal cues for techniques. Pt worked on R LE stance control this session to perform L LE steps in place forwards/backwards x 10, side steps in place x 10 and toe taps to 2 inch step 2 x 10. Pt performed seated hip abduction with orange theraband 2 x 10 for strengthening. Pt performed stand pivot to w/c with min assist and transported back to room. Pt left in w/c with needs in reach and chair alarm set.   Session 2: Pt seated in w/c upon PT arrival, agreeable to therapy tx and denies pain. Pt transported to the gym in w/c. Pt performed stand pivot from w/c<>nustep this session with min assist and verbal cues for techniques. Pt used nustep this session x 7 minutes on workload 5 for strengthening and neuro re-ed. Pt transported to gym. Pt ambulated 2 x 40 ft this session with RW and min assist, verbal cues for upright posture and R knee extension during stance. Pt worked on dynamic standing balance without UE support this session to complete peg board puzzle, min guard for balance. Pt also worked on R UE coordination and neuro re-ed to grasp peg pieces. Pt performed 2 x 5 sit<>stands with RW this session for LE strengthening, verbal cues for techniques and hand placement,  supervision-CGA. Pt worked on R hip flexor strengthening to perform x 10 marches in sitting and x 10 marches in standing. Pt worked on R LE swing phase to perform cone kicking activity, using RW for UE support. Pt performed stepping in place with L LE working on R LE stance control and strength, no AD. Pt transferred to w/c with min assist and transported back to room, left with needs in reach and chair alarm set.   Therapy Documentation Precautions:  Precautions Precautions: Fall Precaution Comments: R hemi, R knee buckles Restrictions Weight Bearing Restrictions: No   Therapy/Group: Individual Therapy  Netta Corrigan, PT, DPT 12/03/2018, 9:47 AM

## 2018-12-04 ENCOUNTER — Inpatient Hospital Stay (HOSPITAL_COMMUNITY): Payer: Medicare Other | Admitting: Speech Pathology

## 2018-12-04 ENCOUNTER — Inpatient Hospital Stay (HOSPITAL_COMMUNITY): Payer: Medicare Other | Admitting: Occupational Therapy

## 2018-12-04 ENCOUNTER — Other Ambulatory Visit: Payer: Self-pay | Admitting: Internal Medicine

## 2018-12-04 ENCOUNTER — Inpatient Hospital Stay (HOSPITAL_COMMUNITY): Payer: Medicare Other

## 2018-12-04 LAB — CREATININE, SERUM
Creatinine, Ser: 0.95 mg/dL (ref 0.44–1.00)
GFR calc Af Amer: >60 mL/min (ref >60)
GFR calc non Af Amer: 55 mL/min — ABNORMAL LOW (ref >60)

## 2018-12-04 MED ORDER — AMLODIPINE BESYLATE 2.5 MG PO TABS
2.5000 mg | ORAL_TABLET | Freq: Every day | ORAL | Status: DC
  Administered 2018-12-04 – 2018-12-06 (×3): 2.5 mg via ORAL
  Filled 2018-12-04 (×3): qty 1

## 2018-12-04 NOTE — Progress Notes (Signed)
Physical Therapy Session Note  Patient Details  Name: [name] [name] MRN: [id number] Date of Birth: October 30, 1932  Today's Date: 12/04/2018 PT Individual Time: 0900-1000 and 1100-1200 PT Individual Time Calculation (min): 60 min and 60 min    Short Term Goals: Week 2:  PT Short Term Goal 1 (Week 2): STG=LTG due to ELOS  Skilled Therapeutic Interventions/Progress Updates:   Session 1:  Pt supine in bed upon PT arrival, agreeable to therapy tx and denies pain. Pt transferred to sitting with supervision. Pt performed stand pivot to w/c with min assist and transported into bathroom. Pt performed stand pivot to toilet with min assist. Pt performed clothing management in standing with use of RW for balance, min assist. Pt continent of bladder this session. Pt transferred back to w/c with RW stand pivot min assist. Pt transported to the gym. Pt performed blocked practice of stand pivot transfers this session with RW and CGA-min assist, x 3 in each direction and verbal cues for techniques. Pt performed 2 x 5 sit<>stands this session with RW and supervision working on LE strengthening and techniques. Pt ambulated 2 x 35 ft with RW and min assist, increased cues during turns for foot placement. Pt worked on standing balance without UE support while performing card matching activity, x 2 trials with CGA for balance. Pt transported back to room and left in w/c with needs in reach.   Session 2: Pt seated in w/c upon PT arrival, agreeable to therapy tx and denies pain. Pt transported to the gym, pts son present to observe therapy session. Pt transferred to mat with RW and min assist, stand pivot. Pt performed 2 x 5 sit<>stands this session with RW and supervision working on LE strengthening and techniques.Pt ambulated 2 x 40 ft with RW and min assist, increased cues during turns for foot placement. Pt transported to rehab apartment and ambulated x 8 ft to the bed, performed transfer to bed with RW and min assist. Pt  performed bed mobility with supervision and cues for techniques. Pt simulated shower transfer this session with RW and ambulation, pt stepped backwards over threshold into shower to sit on shower chair, min assist. Pt performed car transfer this session with min assist and RW, stand pivot. Pt transported to dayroom and used nustep this session x 6 minutes on workload 5 for global strengthening and endurance. Pt propelled w/c x 50 ft this session with supervision and cues, encouraged pt to work on this outside of therapy. Pt left in w/c with chair alarm set and son present.   Therapy Documentation Precautions:  Precautions Precautions: Fall Precaution Comments: R hemi, R knee buckles Restrictions Weight Bearing Restrictions: No    Therapy/Group: Individual Therapy  Netta Corrigan, PT, DPT 12/04/2018, 7:49 AM

## 2018-12-04 NOTE — Progress Notes (Signed)
Occupational Therapy Session Note  Patient Details  Name: [name] [name] MRN: [id number] Date of Birth: June 22, 1933  Today's Date: 12/04/2018 OT Individual Time: 1300-1353 OT Individual Time Calculation (min): 53 min  and Today's Date: 12/04/2018 OT Missed Time: 22 Minutes Missed Time Reason: Other (comment)(Visitors)   Short Term Goals: Week 1:  OT Short Term Goal 1 (Week 1): Pt will complete grooming task bimanually with CS to demonstrate improved R UE coordination OT Short Term Goal 1 - Progress (Week 1): Partly met OT Short Term Goal 2 (Week 1): Pt will transfer to toilet with CS OT Short Term Goal 2 - Progress (Week 1): Met OT Short Term Goal 3 (Week 1): Pt will don pants with min A OT Short Term Goal 3 - Progress (Week 1): Met OT Short Term Goal 4 (Week 1): Pt will feed self 1/2 of meal with R UE with 100% accuracy  OT Short Term Goal 4 - Progress (Week 1): Partly met  Skilled Therapeutic Interventions/Progress Updates:    Pt seen for OT ADL bathing/dressing session. Pt sitting up in w/c upon arrival, denying pain and agreeable to tx session and bathing at shower level. She completed sit>stand and stand pivot transfers throughout session with close supervision, using grab bar to assist with stand pivot from w/c to tub transfer bench in shower. She bathed seated on tub bench with supervision overall, guarding assist when standing to complete pericare/buttock hygiene and assist to place R LE into figure four position and pt able to wash LE. She returned to w/c to dress, able to independently recall hemi-dressing techniques. She required min A to manage clothing over R LE, standing with supervision at RW to pull pants/underwear up with min A to manage entirely over R hip. Min A to initiate donning of sock on R foot, able to don L sock independently. With encouragement, pt willing to attempt to comb hair standing at sink. Without AFO, required physical assist from therapist to place R LE for  wide BOS and VCs to intiaite weightbearing through R LE when standing to complete task. Min A for balance. Pt left seated in w/c at end of session,  Chair belt alarm on and all needs in reach.  During session, pt had visitors arrive. Pt very excited and wanting to visit, even if it meant missing tx time. Pt missed 22 minutes of skilled tx as a result.   Therapy Documentation Precautions:  Precautions Precautions: Fall Precaution Comments: R hemi, R knee buckles Restrictions Weight Bearing Restrictions: No Pain:   No/denies pain   Therapy/Group: Individual Therapy  Karla Vines L 12/04/2018, 7:10 AM

## 2018-12-04 NOTE — Progress Notes (Signed)
[facility] PHYSICAL MEDICINE & REHABILITATION PROGRESS NOTE   Subjective/Complaints:  Pt concerned about D/C on Friday 2/21  ROS: Patient CP, SOB, N/V/D, bowel or bladder disturbance Objective:   No results found. No results for input(s): WBC, HGB, HCT, PLT in the last 72 hours. No results for input(s): NA, K, CL, CO2, GLUCOSE, BUN, CREATININE, CALCIUM in the last 72 hours.  Intake/Output Summary (Last 24 hours) at 12/04/2018 0729 Last data filed at 12/03/2018 1700 Gross per 24 hour  Intake 240 ml  Output -  Net 240 ml     Physical Exam: Vital Signs Blood pressure (!) 156/86, pulse 78, temperature 98.1 F (36.7 C), temperature source Oral, resp. rate 18, height 5\' 5"  (1.651 m), weight 68.2 kg, SpO2 99 %. Constitutional: No distress . Vital signs reviewed. HEENT: EOMI, oral membranes moist Neck: supple Cardiovascular: RRR without murmur. No JVD    Respiratory: CTA Bilaterally without wheezes or rales. Normal effort    GI: BS +, non-tender, non-distended  Musc: Lower extremity edema Neurologic: Alert and oriented Motor: 5/5 LUE proximal to distal 4+-5/5 LLE proximal to distal,  4/5 RUE proximal to distal 4-/5 RLE proximal and distal--- stable exam Skin: No evidence of breakdown, no evidence of rash Psych: pleasant.  Assessment/Plan: 1. Functional deficits secondary to Left corona radiata and posterior MCA infarcts onset 11/18/18 which require 3+ hours per day of interdisciplinary therapy in a comprehensive inpatient rehab setting.  Physiatrist is providing close team supervision and 24 hour management of active medical problems listed below.  Physiatrist and rehab team continue to assess barriers to discharge/monitor patient progress toward functional and medical goals  Care Tool:  Bathing    Body parts bathed by patient: Right arm, Left arm, Chest, Abdomen, Front perineal area, Buttocks, Right upper leg, Left upper leg, Right lower leg, Left lower leg, Face   Body  parts bathed by helper: Buttocks, Right lower leg, Left lower leg     Bathing assist Assist Level: Supervision/Verbal cueing     Upper Body Dressing/Undressing Upper body dressing   What is the patient wearing?: Pull over shirt    Upper body assist Assist Level: Supervision/Verbal cueing    Lower Body Dressing/Undressing Lower body dressing      What is the patient wearing?: Underwear/pull up, Pants     Lower body assist Assist for lower body dressing: Minimal Assistance - Patient > 75%     Toileting Toileting Toileting Activity did not occur Landscape architect and hygiene only): Refused  Toileting assist Assist for toileting: Moderate Assistance - Patient 50 - 74%     Transfers Chair/bed transfer  Transfers assist     Chair/bed transfer assist level: Minimal Assistance - Patient > 75%     Locomotion Ambulation   Ambulation assist      Assist level: Minimal Assistance - Patient > 75% Assistive device: Walker-rolling Max distance: 40 ft   Walk 10 feet activity   Assist     Assist level: Minimal Assistance - Patient > 75% Assistive device: Walker-rolling   Walk 50 feet activity   Assist Walk 50 feet with 2 turns activity did not occur: Safety/medical concerns(R hemiparesis, buckling)  Assist level: Minimal Assistance - Patient > 75% Assistive device: Walker-rolling    Walk 150 feet activity   Assist Walk 150 feet activity did not occur: Safety/medical concerns         Walk 10 feet on uneven surface  activity   Assist Walk 10 feet on uneven surfaces activity  did not occur: Safety/medical concerns         Wheelchair     Assist Will patient use wheelchair at discharge?: Yes Type of Wheelchair: Manual    Wheelchair assist level: Supervision/Verbal cueing Max wheelchair distance: 100'    Wheelchair 50 feet with 2 turns activity    Assist        Assist Level: Supervision/Verbal cueing   Wheelchair 150 feet  activity     Assist Wheelchair 150 feet activity did not occur: Safety/medical concerns   Assist Level: Moderate Assistance - Patient 50 - 74%      Medical Problem List and Plan: 1.Right side weaknesssecondary to embolicleftterritory MCA infarction.Status post loop recorder Continue CIR PT, OT, SLP  ASA and plavix for 3 weeks then ASA 81mg  alone- on 12/09/2018 2. DVT Prophylaxis/Anticoagulation: subcutaneous Lovenox. Monitor for any bleeding episodes 3. Pain Management:Tylenol as needed 4. Mood:Abilify 5 mg daily, Bipolar disorder  Grounds pass ordered on 2/10 5. Neuropsych: This patientiscapable of making decisions on herown behalf. 6. Skin/Wound Care:Routine skin checks 7. Fluids/Electrolytes/Nutrition:Routine in and out's   BMP ok 2/10 8. Hypothyroidism. Synthroid 9. Hyperlipidemia. Lipitor 10.  Chronic insomnia: Resumed Ambien but at 5mg  11.  Hypokalemia:   Potassium 4.0 on 2/7, repeat 2/10 3.7   Continue KCl 68meq BID 12.  BP control reasonable 2/16    Vitals:   12/03/18 1931 12/04/18 0530  BP: (!) 145/75 (!) 156/86  Pulse: 84 78  Resp: 18 18  Temp: 98.2 F (36.8 C) 98.1 F (36.7 C)  SpO2: 98% 99%  Sys mildly elevated resume low dose amlodipine 13.  Peripheral edema- improved  Lasix 20 started on 2/8, cont KCL  -Follow-up electrolytes next week  LOS: 14 days A FACE TO [place]ley E Kirsteins 12/04/2018, 7:29 AM

## 2018-12-05 ENCOUNTER — Inpatient Hospital Stay (HOSPITAL_COMMUNITY): Payer: Self-pay

## 2018-12-05 ENCOUNTER — Inpatient Hospital Stay (HOSPITAL_COMMUNITY): Payer: Medicare Other | Admitting: Physical Therapy

## 2018-12-05 ENCOUNTER — Inpatient Hospital Stay (HOSPITAL_COMMUNITY): Payer: Medicare Other

## 2018-12-05 ENCOUNTER — Other Ambulatory Visit: Payer: Self-pay | Admitting: Internal Medicine

## 2018-12-05 MED ORDER — ARIPIPRAZOLE 5 MG PO TABS: 5.0000 mg | ORAL_TABLET | Freq: Every day | ORAL | 0 refills | Status: AC

## 2018-12-05 MED ORDER — AMLODIPINE BESYLATE 2.5 MG PO TABS: 2.5000 mg | ORAL_TABLET | Freq: Every day | ORAL | Status: DC

## 2018-12-05 MED ORDER — CLOPIDOGREL BISULFATE 75 MG PO TABS
75.0000 mg | ORAL_TABLET | Freq: Every day | ORAL | Status: DC
  Administered 2018-12-05 – 2018-12-06 (×2): 75 mg via ORAL
  Filled 2018-12-05 (×2): qty 1

## 2018-12-05 MED ORDER — MELATONIN 5 MG PO TABS: 15.0000 mg | ORAL_TABLET | Freq: Every day | ORAL | 0 refills | Status: AC

## 2018-12-05 MED ORDER — POTASSIUM CHLORIDE CRYS ER 20 MEQ PO TBCR: 20.0000 meq | EXTENDED_RELEASE_TABLET | Freq: Every day | ORAL | Status: DC

## 2018-12-05 MED ORDER — PROSIGHT PO TABS: 1.0000 | ORAL_TABLET | Freq: Every day | ORAL | 0 refills | Status: DC

## 2018-12-05 MED ORDER — SENNOSIDES-DOCUSATE SODIUM 8.6-50 MG PO TABS: 2.0000 | ORAL_TABLET | Freq: Two times a day (BID) | ORAL | Status: DC

## 2018-12-05 MED ORDER — FUROSEMIDE 20 MG PO TABS: 20.0000 mg | ORAL_TABLET | Freq: Every day | ORAL | Status: DC

## 2018-12-05 MED ORDER — ASPIRIN EC 81 MG PO TBEC
81.0000 mg | DELAYED_RELEASE_TABLET | Freq: Every day | ORAL | Status: DC
  Administered 2018-12-05 – 2018-12-06 (×2): 81 mg via ORAL
  Filled 2018-12-05 (×2): qty 1

## 2018-12-05 NOTE — Progress Notes (Signed)
Physical Therapy Session Note  Patient Details  Name: [name] [name] MRN: [id number] Date of Birth: 06-04-1933  Today's Date: 12/05/2018 PT Individual Time: 1500-1530 PT Individual Time Calculation (min): 30 min   Short Term Goals: Week 2:  PT Short Term Goal 1 (Week 2): STG=LTG due to ELOS  Skilled Therapeutic Interventions/Progress Updates: Pt presented hand off from PT agreeable to therapy. Pt stating fatigued but no pain. Pt participated in w/c propulsion approx 72f using both hemi-technique and BUE. Pt noted to have difficultly coordinating use of LUE and LLE for efficient propulsion. Attempted also use of BUE with LLE with minimal functional change. Pt transported to day room and performed stand pivot to NuStep with RW. Participated in NuStep L3 x 10 min for reciprocal activity and general conditioning. Pt returned to w/c in same manner as prior. Pt then transported back to room and remained in w/c at end of session with belt alarm on, call bell within reach and current needs met.       Therapy Documentation Precautions:  Precautions Precautions: Fall Precaution Comments: R hemi Restrictions Weight Bearing Restrictions: No General:   Vital Signs: Therapy Vitals Temp: 98.1 F (36.7 C) Pulse Rate: 81 Resp: 16 BP: 109/62 Patient Position (if appropriate): Sitting Oxygen Therapy SpO2: 98 % O2 Device: Room Air Pain:      Other Treatments:      Therapy/Group: Individual Therapy  [name] [name]  [name] [name], PTA  12/05/2018, 3:47 PM

## 2018-12-05 NOTE — Progress Notes (Signed)
Physical Therapy Session Note  Patient Details  Name: [name] [name] MRN: [id number] Date of Birth: 1933/08/27  Today's Date: 12/05/2018 PT Individual Time: 1435-1500 PT Individual Time Calculation (min): 25 min   Short Term Goals: Week 2:  PT Short Term Goal 1 (Week 2): STG=LTG due to ELOS  Skilled Therapeutic Interventions/Progress Updates:    Pt seen for makeup therapy minutes. Pt agreeable to therapy session, no complaints of pain. Dependent transport via w/c to therapy gym for time conservation. Sit to stand to RW with min A. Stand pivot transfer w/c to mat table with min A and RW, v/c for sequencing with BLE to prevent fall. Sit to stand x 5 reps from mat table with no AD and min A, focus on eccentric control when sitting. Standing alt L/R 2" step taps with BUE support on RW and min A for balance, focus on R hip flexion. Standing balance completing horseshoe toss with min A and one UE on RW for support. Pt fatigues quickly in standing and requests frequent rest breaks. Pt left seated in w/c handed off to next PT.  Therapy Documentation Precautions:  Precautions Precautions: Fall Precaution Comments: R hemi Restrictions Weight Bearing Restrictions: No    Therapy/Group: Individual Therapy   Excell Seltzer, PT, DPT  12/05/2018, 4:10 PM

## 2018-12-05 NOTE — Progress Notes (Signed)
Occupational Therapy Session Note  Patient Details  Name: [name] [name] MRN: [id number] Date of Birth: [birth year]  Today's Date: 12/05/2018 OT Individual Time: 1115-1200 OT Individual Time Calculation (min): 45 min    Short Term Goals: Week 2:  OT Short Term Goal 1 (Week 2): STG=LTG d/t ELOS  Skilled Therapeutic Interventions/Progress Updates:   OT intervention with focus on functional tranfsers, sit<>stand, standing balance, RUE functional use, activity tolerance, and safety awareness to increase independence with BADLs. Pt transitioned to gym and engaged in standing tasks X 3 using RUE to place and remove colored pegs from peg board following a designated pattern. Pt required min verbal cues to extend RLE when standing and for weight shifts.  Pt required rest breaks X 3 during session.  Pt performed stand pivot tranfsers with RW at supervision level. Pt returned to room and remained in w/c with all needs within reach and belt alarm activated.    Therapy Documentation Precautions:  Precautions Precautions: Fall Precaution Comments: R hemi, R knee buckles Restrictions Weight Bearing Restrictions: No  Pain: Pain Assessment Pain Scale: 0-10 Pain Score: 0-No pain     Therapy/Group: Individual Therapy  Leroy Libman 12/05/2018, 12:17 PM

## 2018-12-05 NOTE — Progress Notes (Signed)
Physical Therapy Discharge Summary  Patient Details  Name: [name] [name] MRN: [id number] Date of Birth: [birth year]   Patient has met 5 of 6 long term goals due to improved activity tolerance, improved balance, improved postural control, increased strength and improved awareness.  Patient to discharge at an ambulatory level Min Assist.     Pt did not met standing balance goal as she still requires CGA to min assist for standing balance activities.   Recommendation:  Patient will benefit from ongoing skilled PT services in skilled nursing facility setting to continue to advance safe functional mobility, address ongoing impairments in balance, strength, gait, and minimize fall risk.  Equipment: No equipment provided  Reasons for discharge: treatment goals met and discharge from hospital  Patient/family agrees with progress made and goals achieved: Yes  PT Discharge Precautions/Restrictions Precautions Precautions: Fall Precaution Comments: R hemi Restrictions Weight Bearing Restrictions: No Pain   denies pain Cognition Overall Cognitive Status: Within Functional Limits for tasks assessed Arousal/Alertness: Awake/alert Orientation Level: Oriented X4 Attention: Sustained Sustained Attention: Appears intact Memory: Appears intact Awareness: Appears intact Awareness Impairment: Anticipatory impairment Safety/Judgment: Appears intact Sensation Sensation Light Touch: Impaired Detail Central sensation comments: numbness to R LE/UE Additional Comments: impaired sensation R side Coordination Gross Motor Movements are Fluid and Coordinated: No Fine Motor Movements are Fluid and Coordinated: No Coordination and Movement Description: coordination impaired secondary to R hemiparesis  Heel Shin Test: impaired R LE Motor  Motor Motor: Hemiplegia Motor - Skilled Clinical Observations: R hemiparesis  Mobility Bed Mobility Bed Mobility: Rolling Right;Rolling Left;Supine to Sit;Sit  to Supine Rolling Right: Supervision/verbal cueing Rolling Left: Supervision/Verbal cueing Supine to Sit: Supervision/Verbal cueing Sit to Supine: Supervision/Verbal cueing Transfers Transfers: Sit to Stand;Stand to Sit;Stand Pivot Transfers Sit to Stand: Supervision/Verbal cueing Stand to Sit: Supervision/Verbal cueing Stand Pivot Transfers: Contact Guard/Touching assist;Supervision/Verbal cueing Stand Pivot Transfer Details: Verbal cues for technique;Verbal cues for precautions/safety;Verbal cues for sequencing Transfer (Assistive device): Rolling walker Locomotion  Gait Ambulation: Yes Gait Assistance: Contact Guard/Touching assist;Minimal Assistance - Patient > 75% Gait Distance (Feet): 76 Feet Assistive device: Rolling walker Gait Assistance Details: Verbal cues for technique;Verbal cues for precautions/safety;Verbal cues for sequencing Gait Gait: Yes Gait Pattern: Impaired Gait Pattern: Step-to pattern;Decreased hip/knee flexion - right;Decreased weight shift to left Stairs / Additional Locomotion Stairs: Yes Stairs Assistance: Minimal Assistance - Patient > 75% Stair Management Technique: Two rails Number of Stairs: 4 Height of Stairs: 6  Trunk/Postural Assessment  Cervical Assessment Cervical Assessment: Exceptions to WFL(forward head posture) Thoracic Assessment Thoracic Assessment: Exceptions to WFL(kyphosis) Lumbar Assessment Lumbar Assessment: Exceptions to WFL(posteior pelvic tilt) Postural Control Postural Control: Deficits on evaluation Trunk Control: L lateral lean Protective Responses: impaired  Balance Balance Balance Assessed: Yes Static Sitting Balance Static Sitting - Level of Assistance: 5: Stand by assistance Dynamic Sitting Balance Dynamic Sitting - Level of Assistance: 5: Stand by assistance Static Standing Balance Static Standing - Level of Assistance: 5: Stand by assistance Dynamic Standing Balance Dynamic Standing - Level of Assistance: 4:  Min assist Extremity Assessment  RLE Assessment RLE Assessment: Exceptions to [facility] Passive Range of Motion (PROM) Comments: tight heel cords and hamstrings RLE Strength Right Hip Flexion: 3-/5 Right Hip Extension: 3/5 Right Hip ABduction: 3-/5 Right Knee Flexion: 3/5 Right Knee Extension: 3-/5 Right Ankle Dorsiflexion: 2-/5 Right Ankle Plantar Flexion: 2-/5 LLE Assessment LLE Assessment: Within Functional Limits Passive Range of Motion (PROM) Comments: tight heel cords and hamstrings General Strength Comments: grossly 4/5 throughout    [place]  Enid Skeens, PT, DPT 12/05/2018, 1:47 PM

## 2018-12-05 NOTE — [place] FL2 (Addendum)
[place] MEDICAID FL2 LEVEL OF CARE SCREENING TOOL     IDENTIFICATION  Patient Name: [name] [name] Birthdate: [birth year] Sex: female Admission Date (Current Location): 11/20/2018  [facility] and [place] Number:  Herbalist and Address:  [facility], [place] [address], [place], [place] [address]      Provider Number: [id number]  Attending Physician Name and Address:  Charlett Blake, MD  Relative Name and Phone Number:       Current Level of Care: Other (Comment)(Inpatient Rehabilitation) Recommended Level of Care: [place] Prior Approval Number:    Date Approved/Denied:   PASRR Number:  [id number]  Discharge Plan: SNF    Current Diagnoses: Patient Active Problem List   Diagnosis Date Noted  . Right hemiparesis ([place])   . Peripheral edema   . Hypertensive crisis   . Labile blood pressure   . Hypokalemia   . Left middle cerebral artery stroke ([place]) 11/20/2018  . Acute arterial ischemic stroke, multifocal, posterior circulation, left ([place]) 11/19/2018  . Stroke ([place]) 11/18/2018  . Stroke (cerebrum) ([place]) 11/18/2018  . Myalgia 08/06/2018  . Left lower quadrant pain 08/29/2017  . Dyspepsia 03/25/2017  . Atherosclerosis of aorta ([place]) 09/27/2016  . COPD with acute bronchitis ([place]) 09/27/2016  . Acute blood loss anemia 03/16/2015  . Osteoarthritis of right knee 03/04/2015  . Status post total right knee replacement 03/04/2015  . Contusion of knee, right 10/23/2014  . IBS 02/23/2010  . HEMORRHOIDS, EXTERNAL 01/01/2010  . HEMATURIA UNSPECIFIED 11/04/2009  . NAUSEA 06/02/2009  . URINARY INCONTINENCE, STRESS, FEMALE 05/09/2009  . Acute sinusitis 11/25/2008  . UTI 04/23/2008  . COLONIC POLYPS 12/21/2007  . Bipolar disorder ([place]) 12/21/2007  . DEVIATED NASAL SEPTUM 12/21/2007  . HERNIA, VENTRAL 12/21/2007  . Osteoarthritis 12/21/2007  . Hypothyroidism 09/01/2007  . ECZEMA 09/01/2007  . Psoriasis 09/01/2007  .  Osteoporosis 09/01/2007  . Insomnia 09/01/2007    Orientation RESPIRATION BLADDER Height & Weight     Self, Time, Situation, Place  Normal Continent Weight: 68.2 kg Height:  5\' 5"  (165.1 cm)  BEHAVIORAL SYMPTOMS/MOOD NEUROLOGICAL BOWEL NUTRITION STATUS      Continent (normal diet, thin liquids)  AMBULATORY STATUS COMMUNICATION OF NEEDS Skin   Limited Assist(moderate assistance with rolling walker) Verbally Normal                       Personal Care Assistance Level of Assistance  Bathing, Dressing Bathing Assistance: Limited assistance   Dressing Assistance: Limited assistance     Functional Limitations Info  (none)          SPECIAL CARE FACTORS FREQUENCY  PT (By licensed PT), OT (By licensed OT) Blood Pressure Frequency: per facility protocol, at least once/day   PT Frequency: 5-7x/week OT Frequency: 5-7x/week            Contractures Contractures Info: Not present    Additional Factors Info  Code Status Code Status Info: DNR             Current Medications (12/05/2018):  This is the current hospital active medication list Current Facility-Administered Medications  Medication Dose Route Frequency Provider Last Rate Last Dose  . acetaminophen (TYLENOL) tablet 650 mg  650 mg Oral Q4H PRN Angiulli, Lavon Paganini, PA-C       Or  . acetaminophen (TYLENOL) solution 650 mg  650 mg Per Tube Q4H PRN Angiulli, Lavon Paganini, PA-C       Or  .  acetaminophen (TYLENOL) suppository 650 mg  650 mg Rectal Q4H PRN Angiulli, Lavon Paganini, PA-C      . amLODipine (NORVASC) tablet 2.5 mg  2.5 mg Oral Daily Kirsteins, Luanna Salk, MD   2.5 mg at 12/05/18 0834  . ARIPiprazole (ABILIFY) tablet 5 mg  5 mg Oral Daily Cathlyn Parsons, PA-C   5 mg at 12/05/18 6384  . aspirin EC tablet 81 mg  81 mg Oral Daily Charlett Blake, MD   81 mg at 12/05/18 0834  . atorvastatin (LIPITOR) tablet 40 mg  40 mg Oral q1800 Cathlyn Parsons, PA-C   40 mg at 12/04/18 1729  . calcium-vitamin D (OSCAL WITH  D) 500-200 MG-UNIT per tablet 1 tablet  1 tablet Oral Q breakfast Kirsteins, Luanna Salk, MD   1 tablet at 12/05/18 [phone]  . cloNIDine (CATAPRES) tablet 0.1 mg  0.1 mg Oral Daily PRN Cathlyn Parsons, PA-C   0.1 mg at 11/24/18 6803  . clopidogrel (PLAVIX) tablet 75 mg  75 mg Oral Daily Charlett Blake, MD   75 mg at 12/05/18 0834  . enoxaparin (LOVENOX) injection 40 mg  40 mg Subcutaneous Q24H Cathlyn Parsons, PA-C   40 mg at 12/04/18 2109  . furosemide (LASIX) tablet 20 mg  20 mg Oral Daily Jamse Arn, MD   20 mg at 12/05/18 0834  . levothyroxine (SYNTHROID, LEVOTHROID) tablet 88 mcg  88 mcg Oral QAC breakfast Cathlyn Parsons, PA-C   88 mcg at 12/05/18 2122  . Melatonin TABS 15 mg  15 mg Oral QHS Cathlyn Parsons, PA-C   15 mg at 12/04/18 2110  . multivitamin (PROSIGHT) tablet 1 tablet  1 tablet Oral Daily Kirsteins, Luanna Salk, MD   1 tablet at 12/05/18 [phone]  . potassium chloride SA (K-DUR,KLOR-CON) CR tablet 20 mEq  20 mEq Oral BID Charlett Blake, MD   20 mEq at 12/05/18 0834  . senna-docusate (Senokot-S) tablet 2 tablet  2 tablet Oral BID Charlett Blake, MD   2 tablet at 12/05/18 [phone]  . sorbitol 70 % solution 30 mL  30 mL Oral Daily PRN Cathlyn Parsons, PA-C   30 mL at 11/21/18 1736  . zolpidem (AMBIEN) tablet 5 mg  5 mg Oral QHS PRN Charlett Blake, MD   5 mg at 12/04/18 2110     Discharge Medications: Please see discharge summary for a list of discharge medications.  Relevant Imaging Results:  Relevant Lab Results:   Additional Information SSN: [id number]  Parmvir Boomer, Silvestre Mesi, LCSW

## 2018-12-05 NOTE — Progress Notes (Signed)
Social Work Patient ID: [name] [name], female   DOB: 28-Dec-1932, 85 y.o.   MRN: [id number]   Pt and son notified CSW that they wanted to pursue SNF care from [facility].  Pt has been as [facility] in the past and asked that CSW look into this as a possibility for pt this time, as well.  CSW faxed FL2 to [facility] and spoke with [facility] who accepted pt.  CSW received confirmation from Dr. Letta Pate that pt is medically stable for d/c when bed is available.  Bed will be ready tomorrow, so CSW is making preparations for pt's transfer.  Pt's son plans to transport her via his vehicle and he has been trained by PT to do so.  CSW will continue to facilitate transfer and pt/son are both very pleased that this will work out.  They are aware that pt's LOS will likely not be very long at SNF.  Pt looks forward to getting home, but having SNF as a step in between home and CIR has relieved some of her anxiety.

## 2018-12-05 NOTE — Discharge Summary (Signed)
Discharge summary job # [id number]

## 2018-12-05 NOTE — Progress Notes (Signed)
[facility] PHYSICAL MEDICINE & REHABILITATION PROGRESS NOTE   Subjective/Complaints:  No issues overnite , still anxious about d/c  ROS: Patient CP, SOB, N/V/D, bowel or bladder disturbance Objective:   No results found. No results for input(s): WBC, HGB, HCT, PLT in the last 72 hours. Recent Labs    12/04/18 0656  CREATININE 0.95    Intake/Output Summary (Last 24 hours) at 12/05/2018 0715 Last data filed at 12/04/2018 1900 Gross per 24 hour  Intake 720 ml  Output -  Net 720 ml     Physical Exam: Vital Signs Blood pressure (!) 152/94, pulse 99, temperature 98.2 F (36.8 C), resp. rate 17, height 5\' 5"  (1.651 m), weight 68.2 kg, SpO2 96 %. Constitutional: No distress . Vital signs reviewed. HEENT: EOMI, oral membranes moist Neck: supple Cardiovascular: RRR without murmur. No JVD    Respiratory: CTA Bilaterally without wheezes or rales. Normal effort    GI: BS +, non-tender, non-distended  Musc: Lower extremity edema Neurologic: Alert and oriented Motor: 5/5 LUE proximal to distal 4+-5/5 LLE proximal to distal,  4/5 RUE proximal to distal 4-/5 RLE proximal and distal--- stable exam Skin: No evidence of breakdown, no evidence of rash Psych: pleasant.  Assessment/Plan: 1. Functional deficits secondary to Left corona radiata and posterior MCA infarcts onset 11/18/18 which require 3+ hours per day of interdisciplinary therapy in a comprehensive inpatient rehab setting.  Physiatrist is providing close team supervision and 24 hour management of active medical problems listed below.  Physiatrist and rehab team continue to assess barriers to discharge/monitor patient progress toward functional and medical goals  Care Tool:  Bathing    Body parts bathed by patient: Right arm, Left arm, Chest, Abdomen, Front perineal area, Buttocks, Right upper leg, Left upper leg, Right lower leg, Left lower leg, Face   Body parts bathed by helper: Buttocks, Right lower leg, Left lower  leg     Bathing assist Assist Level: Supervision/Verbal cueing     Upper Body Dressing/Undressing Upper body dressing   What is the patient wearing?: Pull over shirt    Upper body assist Assist Level: Supervision/Verbal cueing    Lower Body Dressing/Undressing Lower body dressing      What is the patient wearing?: Underwear/pull up, Pants     Lower body assist Assist for lower body dressing: Minimal Assistance - Patient > 75%     Toileting Toileting Toileting Activity did not occur Landscape architect and hygiene only): Refused  Toileting assist Assist for toileting: Moderate Assistance - Patient 50 - 74%     Transfers Chair/bed transfer  Transfers assist     Chair/bed transfer assist level: Moderate Assistance - Patient 50 - 74%     Locomotion Ambulation   Ambulation assist      Assist level: Minimal Assistance - Patient > 75% Assistive device: Walker-rolling Max distance: 40 ft   Walk 10 feet activity   Assist     Assist level: Minimal Assistance - Patient > 75% Assistive device: Walker-rolling   Walk 50 feet activity   Assist Walk 50 feet with 2 turns activity did not occur: Safety/medical concerns(R hemiparesis, buckling)  Assist level: Minimal Assistance - Patient > 75% Assistive device: Walker-rolling    Walk 150 feet activity   Assist Walk 150 feet activity did not occur: Safety/medical concerns         Walk 10 feet on uneven surface  activity   Assist Walk 10 feet on uneven surfaces activity did not occur: Safety/medical concerns  Wheelchair     Assist Will patient use wheelchair at discharge?: Yes Type of Wheelchair: Manual    Wheelchair assist level: Supervision/Verbal cueing Max wheelchair distance: 100'    Wheelchair 50 feet with 2 turns activity    Assist        Assist Level: Supervision/Verbal cueing   Wheelchair 150 feet activity     Assist Wheelchair 150 feet activity did not  occur: Safety/medical concerns   Assist Level: Moderate Assistance - Patient 50 - 74%      Medical Problem List and Plan: 1.Right side weaknesssecondary to embolicleftterritory MCA infarction.Status post loop recorder Continue [facility] PT, OT, SLP- team conf in am  ASA and plavix for 3 weeks then ASA 81mg  alone- on 12/09/2018 2. DVT Prophylaxis/Anticoagulation: subcutaneous Lovenox. Monitor for any bleeding episodes 3. Pain Management:Tylenol as needed 4. Mood:Abilify 5 mg daily, Bipolar disorder, some anxiety re D/C but otherwise mood stable  Grounds pass ordered on 2/10 5. Neuropsych: This patientiscapable of making decisions on herown behalf. 6. Skin/Wound Care:Routine skin checks 7. Fluids/Electrolytes/Nutrition:Routine in and out's   BMP ok 2/10 8. Hypothyroidism. Synthroid 9. Hyperlipidemia. Lipitor 10.  Chronic insomnia: Resumed Ambien but at 5mg  11.  Hypokalemia:   Potassium 4.0 on 2/7, repeat 2/10 3.7   Continue KCl 50meq BID 12. Essential HTN    Vitals:   12/04/18 2018 12/05/18 0534  BP: 117/78 (!) 152/94  Pulse: 82 99  Resp: 19 17  Temp: 98 F (36.7 C) 98.2 F (36.8 C)  SpO2: 96% 96%  Sys mildly elevated resumed low dose amlodipine 2/17 13.  Peripheral edema- improved  Lasix 20 started on 2/8, cont KCL  -Follow-up electrolytes next week  LOS: 15 days A FACE TO FACE EVALUATION WAS PERFORMED  Charlett Blake 12/05/2018, 7:15 AM

## 2018-12-05 NOTE — Progress Notes (Signed)
Occupational Therapy Session Note  Patient Details  Name: [name] [name] MRN: [id number] Date of Birth: 02/24/1933  Today's Date: 12/05/2018 OT Individual Time: 1470-9295 OT Individual Time Calculation (min): 56 min    Short Term Goals: Week 2:  OT Short Term Goal 1 (Week 2): STG=LTG d/t ELOS  Skilled Therapeutic Interventions/Progress Updates:   Pt received supine in bed eating breakfast, no c/o pain. Discussed d/c planning and importance of 24/7 (S) from son. Set up still required for breakfast to manage containers. Pt transitioned to EOB with cueing for positioning. Pt sat EOB and donned shirt with set up assist only. Pt first attempting to don pull up brief by reaching distally and unable to thread R LE. With cueing for figure 4 position pt able to thread B LE into pull up. Min A to complete sit > stand from EOB with RW. Once in standing, no more than CGA in standing while pt removed B UE from RW to pull up. Pt able to thread pants with no physical A. Assist provided to don teds. Pt able to don L shoe, but required assistance with R shoe/AFO. Bed height was elevated slightly and pt was able to complete another sit > stand with close (S) only to pull pants up in standing. Pt transferred to w/c and completed oral care seated at sink. A second shoe button was obtained for pt's R shoe. Pt left sitting up in w/c with chair alarm belt fastened and all needs met.   Therapy Documentation Precautions:  Precautions Precautions: Fall Precaution Comments: R hemi, R knee buckles Restrictions Weight Bearing Restrictions: No Pain: Pain Assessment Pain Scale: 0-10 Pain Score: 0-No pain   Therapy/Group: Individual Therapy  Curtis Sites 12/05/2018, 7:41 AM

## 2018-12-05 NOTE — Progress Notes (Signed)
Physical Therapy Session Note  Patient Details  Name: [name] [name] MRN: [id number] Date of Birth: 1933/06/30  Today's Date: 12/05/2018 PT Individual Time: 1300-1400 PT Individual Time Calculation (min): 60 min   Short Term Goals: Week 2:  PT Short Term Goal 1 (Week 2): STG=LTG due to ELOS  Skilled Therapeutic Interventions/Progress Updates:    Pt seated in w/c upon PT arrival, agreeable to therapy tx and denies pain. Pt transported to the gym. Pt transferred to mat stand pivot with supervision and RW, verbal cues for techniques. Pt transferred to supine with supervision and performed 2 x 10 bridges, 2 x 10 heel slides R LE and 2 x 10 supine marches R LE for strengthening and neuro re-ed. Pt transferred back to sitting with supervision. Pt ambulated x 76 ft this session with RW and CGA, slow gait speed with cues for step length. Pt ascended/descended 4 steps this session with B rails and min assist, step to pattern with cues for techniques. Pt propelled w/c this session x 50 ft with supervision and increased time using B UEs at times and using L hemi techniques. Pt transported back to room and left seated in w/c with needs in reach.   Therapy Documentation Precautions:  Precautions Precautions: Fall Precaution Comments: R hemi, R knee buckles Restrictions Weight Bearing Restrictions: No   Therapy/Group: Individual Therapy  Netta Corrigan, PT, DPT 12/05/2018, 8:00 AM

## 2018-12-05 NOTE — Discharge Summary (Signed)
[name], [name] MEDICAL RECORD GE:[id number] ACCOUNT [id number] DATE OF BIRTH:1933/02/14 FACILITY: [facility] LOCATION: [facility] PHYSICIAN:ANDREW Letta Pate, MD  DISCHARGE SUMMARY  DATE OF DISCHARGE:  12/06/2018  ADMIT DATE:  11/20/2018    DISCHARGE DATE:  12/06/2018  DISCHARGE DIAGNOSES: 1.  Left territory middle cerebral artery infarction status post loop recorder. 2.  Subcutaneous Lovenox for deep venous thrombosis prophylaxis. 3.  Mood. 4.  Hypothyroidism. 5.  Hyperlipidemia.   6.  Chronic insomnia. 7.  Hypokalemia, resolved. 8.  Hypertension. 9.  Peripheral edema.  HOSPITAL COURSE:  This is an 85 year old right-handed female with history of hypothyroidism who lives alone, independent prior to admission.  Presented to 11/18/2018 with left-sided weakness, falling to the floor.  Cranial CT scan showed a small linear  right and left frontal white matter hypodensity extending to the cortex.  No hemorrhage.  She did not receive tPA.  MRI/MRA showed acute lacunar infarction in the posterior left corona radiata and posterior lentiform with 2 small superimposed small acute  white matter infarctions in the posterior left MCA territory.  No large vessel occlusion noted.  Moderate stenosis of both the distal left A2 and the left anterior M2 branches.  Echocardiogram with ejection fraction of 65%.  Carotid Dopplers with no ICA  stenosis.  Placed on aspirin and Plavix therapy per neurology services.  Subcutaneous Lovenox for DVT prophylaxis.  The patient was admitted for comprehensive rehabilitation program.  PAST MEDICAL HISTORY:  See discharge diagnoses.  SOCIAL HISTORY:  Lives alone, independent prior to admission.  FUNCTIONAL STATUS:  Upon admission to rehab services was moderate assist 12 feet, moderate assist with stand pivot transfers, min mod assist with ADLs.  PHYSICAL EXAMINATION: VITAL SIGNS:  Blood pressure 165/75, pulse 73, temperature 98, respirations 18. GENERAL:  Alert  female in no acute distress.  Oriented x3. HEENT:  EOMs intact. NECK:  Supple, nontender, no JVD. CARDIOVASCULAR:  Rate controlled. ABDOMEN:  Soft, nontender, good bowel sounds. LUNGS:  Clear to auscultation without wheeze.  REHABILITATION HOSPITAL COURSE:  The patient was admitted to inpatient rehabilitation services.  Therapies initiated on a 3-hour daily basis, consisting of physical therapy, occupational therapy and rehabilitation nursing.  The following issues were  addressed.  Pertaining to the patient's left territory MCA infarction, she remained on aspirin and Plavix therapy for 3 weeks, then aspirin alone beginning to 12/09/2018.  Status post loop recorder.  Follow up cardiology services.  Subcutaneous Lovenox  for DVT prophylaxis.  Mood stabilization maintained on Abilify.  She was attending full therapies.  Blood pressure is controlled.  Hormone supplement for hypothyroidism.  Blood pressure is overall monitored.  She did have some peripheral edema.   Maintained on dose diuretic.  The patient received weekly collaborative interdisciplinary team conferences to discuss estimated length of stay, family teaching, any barriers to her discharge.  Ambulating 35 feet rolling walker, minimal assistance, sit to  stand, rolling walker, supervision.  Performed clothing management standing using rolling walker, minimal assist.  Continent of bladder.  Due to limited resources at home, it is felt skilled nursing facility was needed with bed becoming available to  12/06/2018.  DISCHARGE MEDICATIONS:  Included Norvasc 2.5 mg p.o. daily, Abilify 5 mg p.o. daily, aspirin 81 mg p.o. daily, Lipitor 40 mg p.o. daily, Os-Cal 1 tablet p.o. daily, Plavix 75 mg p.o. daily, Lasix 20 mg p.o. daily, Synthroid 88 mcg p.o. daily, melatonin  15 mg p.o. at bedtime, multivitamin daily, potassium chloride 20 mEq p.o. daily, Senokot-S 2 tablets p.o. b.i.d., Tylenol  as needed.  DIET:  Regular consistency.  FOLLOWUP:  The  patient would follow up with Dr. Alysia Penna at the outpatient rehab center as directed; neurology services, Dr. Leonie Man, call for appointment; Dr. Candee Furbish, cardiology services.  SPECIAL INSTRUCTIONS:  Continue aspirin and Plavix for 3 weeks, then aspirin alone beginning to 12/09/2018.  TN/NUANCE D:12/05/2018 T:12/05/2018 JOB:[id number]

## 2018-12-06 ENCOUNTER — Inpatient Hospital Stay (HOSPITAL_COMMUNITY): Payer: Self-pay

## 2018-12-06 ENCOUNTER — Telehealth: Payer: Self-pay

## 2018-12-06 ENCOUNTER — Encounter (HOSPITAL_COMMUNITY): Payer: Medicare Other | Admitting: Psychology

## 2018-12-06 DIAGNOSIS — I63519 Cerebral infarction due to unspecified occlusion or stenosis of unspecified middle cerebral artery: Secondary | ICD-10-CM | POA: Diagnosis not present

## 2018-12-06 DIAGNOSIS — R278 Other lack of coordination: Secondary | ICD-10-CM | POA: Diagnosis not present

## 2018-12-06 DIAGNOSIS — R2681 Unsteadiness on feet: Secondary | ICD-10-CM | POA: Diagnosis not present

## 2018-12-06 DIAGNOSIS — E038 Other specified hypothyroidism: Secondary | ICD-10-CM | POA: Diagnosis not present

## 2018-12-06 DIAGNOSIS — F3489 Other specified persistent mood disorders: Secondary | ICD-10-CM | POA: Diagnosis not present

## 2018-12-06 DIAGNOSIS — I63512 Cerebral infarction due to unspecified occlusion or stenosis of left middle cerebral artery: Secondary | ICD-10-CM | POA: Diagnosis not present

## 2018-12-06 DIAGNOSIS — R0989 Other specified symptoms and signs involving the circulatory and respiratory systems: Secondary | ICD-10-CM | POA: Diagnosis not present

## 2018-12-06 DIAGNOSIS — F39 Unspecified mood [affective] disorder: Secondary | ICD-10-CM | POA: Diagnosis not present

## 2018-12-06 DIAGNOSIS — I119 Hypertensive heart disease without heart failure: Secondary | ICD-10-CM | POA: Diagnosis not present

## 2018-12-06 DIAGNOSIS — G8191 Hemiplegia, unspecified affecting right dominant side: Secondary | ICD-10-CM | POA: Diagnosis not present

## 2018-12-06 DIAGNOSIS — I6300 Cerebral infarction due to thrombosis of unspecified precerebral artery: Secondary | ICD-10-CM | POA: Diagnosis not present

## 2018-12-06 DIAGNOSIS — Z9181 History of falling: Secondary | ICD-10-CM | POA: Diagnosis not present

## 2018-12-06 DIAGNOSIS — M6281 Muscle weakness (generalized): Secondary | ICD-10-CM | POA: Diagnosis not present

## 2018-12-06 DIAGNOSIS — R2689 Other abnormalities of gait and mobility: Secondary | ICD-10-CM | POA: Diagnosis not present

## 2018-12-06 DIAGNOSIS — E440 Moderate protein-calorie malnutrition: Secondary | ICD-10-CM | POA: Diagnosis not present

## 2018-12-06 DIAGNOSIS — R41841 Cognitive communication deficit: Secondary | ICD-10-CM | POA: Diagnosis not present

## 2018-12-06 NOTE — Progress Notes (Signed)
[place] 2x [phone], no answer

## 2018-12-06 NOTE — Telephone Encounter (Signed)
Pt is now at a skilled [place] [facility]. The Nurse supervisor phone number is [phone]. I called the facility to get them to send a manual transmission with her home monitor. I will call them back tomorrow to see if they can locate the pt.

## 2018-12-06 NOTE — Progress Notes (Signed)
[facility] PHYSICAL MEDICINE & REHABILITATION PROGRESS NOTE   Subjective/Complaints:  Pt aware of SNF and is comfortable with this  ROS: Patient CP, SOB, N/V/D, bowel or bladder disturbance Objective:   No results found. No results for input(s): WBC, HGB, HCT, PLT in the last 72 hours. Recent Labs    12/04/18 0656  CREATININE 0.95    Intake/Output Summary (Last 24 hours) at 12/06/2018 0753 Last data filed at 12/05/2018 1700 Gross per 24 hour  Intake 600 ml  Output -  Net 600 ml     Physical Exam: Vital Signs Blood pressure (!) 161/81, pulse 89, temperature 98 F (36.7 C), resp. rate 17, height 5\' 5"  (1.651 m), weight 68.2 kg, SpO2 95 %. Constitutional: No distress . Vital signs reviewed. HEENT: EOMI, oral membranes moist Neck: supple Cardiovascular: RRR without murmur. No JVD    Respiratory: CTA Bilaterally without wheezes or rales. Normal effort    GI: BS +, non-tender, non-distended  Musc: Lower extremity edema Neurologic: Alert and oriented Motor: 5/5 LUE proximal to distal 4+-5/5 LLE proximal to distal,  4/5 RUE proximal to distal 4-/5 RLE proximal and distal--- stable exam Skin: No evidence of breakdown, no evidence of rash Psych: pleasant.  Assessment/Plan: 1. Functional deficits secondary to Left corona radiata and posterior MCA infarcts onset 11/18/18  Stable for D/C today F/u PCP in 3-4 weeks F/u PM&R 2 weeks See D/C summary See D/C instructionsCare Tool:  Bathing    Body parts bathed by patient: Right arm, Left arm, Chest, Abdomen, Front perineal area, Buttocks, Right upper leg, Left upper leg, Right lower leg, Left lower leg, Face   Body parts bathed by helper: Buttocks, Right lower leg, Left lower leg     Bathing assist Assist Level: Supervision/Verbal cueing     Upper Body Dressing/Undressing Upper body dressing   What is the patient wearing?: Pull over shirt    Upper body assist Assist Level: Supervision/Verbal cueing    Lower Body  Dressing/Undressing Lower body dressing      What is the patient wearing?: Underwear/pull up, Pants     Lower body assist Assist for lower body dressing: Minimal Assistance - Patient > 75%     Toileting Toileting Toileting Activity did not occur Landscape architect and hygiene only): Refused  Toileting assist Assist for toileting: Moderate Assistance - Patient 50 - 74%     Transfers Chair/bed transfer  Transfers assist     Chair/bed transfer assist level: Contact Guard/Touching assist     Locomotion Ambulation   Ambulation assist      Assist level: Contact Guard/Touching assist Assistive device: Walker-rolling Max distance: 76 ft   Walk 10 feet activity   Assist     Assist level: Contact Guard/Touching assist Assistive device: Walker-rolling   Walk 50 feet activity   Assist Walk 50 feet with 2 turns activity did not occur: Safety/medical concerns(R hemiparesis, buckling)  Assist level: Contact Guard/Touching assist Assistive device: Walker-rolling    Walk 150 feet activity   Assist Walk 150 feet activity did not occur: Safety/medical concerns         Walk 10 feet on uneven surface  activity   Assist Walk 10 feet on uneven surfaces activity did not occur: Safety/medical concerns         Wheelchair     Assist Will patient use wheelchair at discharge?: Yes Type of Wheelchair: Manual    Wheelchair assist level: Supervision/Verbal cueing Max wheelchair distance: 100'    Wheelchair 50 feet with 2  turns activity    Assist        Assist Level: Supervision/Verbal cueing   Wheelchair 150 feet activity     Assist Wheelchair 150 feet activity did not occur: Safety/medical concerns   Assist Level: Minimal Assistance - Patient > 75%      Medical Problem List and Plan: 1.Right side weaknesssecondary to embolicleftterritory MCA infarction.Status post loop recorder Continue CIR PT, OT, SLP-plan d/c  today  ASA and plavix for 3 weeks then ASA 81mg  alone- on 12/09/2018 2. DVT Prophylaxis/Anticoagulation: subcutaneous Lovenox. Monitor for any bleeding episodes 3. Pain Management:Tylenol as needed 4. Mood:Abilify 5 mg daily, Bipolar disorder, some anxiety re D/C but otherwise mood stable  Grounds pass ordered on 2/10 5. Neuropsych: This patientiscapable of making decisions on herown behalf. 6. Skin/Wound Care:Routine skin checks 7. Fluids/Electrolytes/Nutrition:Routine in and out's   BMP ok 2/10 8. Hypothyroidism. Synthroid 9. Hyperlipidemia. Lipitor 10.  Chronic insomnia: Resumed Ambien but at 5mg  11.  Hypokalemia:   Potassium 4.0 on 2/7, repeat 2/10 3.7   Continue KCl 59meq BID 12. Essential HTN    Vitals:   12/05/18 1936 12/06/18 0458  BP:  (!) 161/81  Pulse: 86 89  Resp: 17 17  Temp: 97.6 F (36.4 C) 98 F (36.7 C)  SpO2: 96% 95%  Sys mildly elevated resumed low dose amlodipine 2/17, still has not reached steady state, may need to increase per MD at SNF 13.  Peripheral edema- improved  Lasix 20 started on 2/8, cont KCL  -Follow-up electrolytes next week  LOS: 16 days A FACE TO [place] E Dene Landsberg 12/06/2018, 7:53 AM

## 2018-12-06 NOTE — Plan of Care (Signed)
Pt d/c to skilled facility

## 2018-12-06 NOTE — Telephone Encounter (Signed)
I left a message for pt to send a manual transmission with her home monitor. I left our number to call back if she has any questions.

## 2018-12-06 NOTE — Progress Notes (Signed)
Occupational Therapy Discharge Summary  Patient Details  Name: [name] [name] MRN: [id number] Date of Birth: 02/22/1933   Patient has met 5 of 9 long term goals due to improved activity tolerance, improved balance, postural control, ability to compensate for deficits, functional use of  RIGHT upper extremity, improved attention, improved awareness and improved coordination.  Patient to discharge at overall Supervision/contact guard level.    Reasons goals not met: Pt discharged several days early from the program d/t pt and family decision to pursue brief SNF stay.   Recommendation:  Patient will benefit from ongoing skilled OT services in skilled nursing facility setting to continue to advance functional skills in the area of BADL.  Equipment: Defer to next venue  Reasons for discharge: treatment goals met and discharge from hospital  Patient/family agrees with progress made and goals achieved: Yes  OT Discharge Precautions/Restrictions  Precautions Precautions: Fall Precaution Comments: R hemi Restrictions Weight Bearing Restrictions: No Pain Pain Assessment Pain Scale: 0-10 Pain Score: 0-No pain ADL ADL Eating: Supervision/safety, Set up Where Assessed-Eating: Bed level Grooming: Supervision/safety Where Assessed-Grooming: Wheelchair Upper Body Bathing: Supervision/safety Where Assessed-Upper Body Bathing: Shower Lower Body Bathing: Supervision/safety Where Assessed-Lower Body Bathing: Shower Upper Body Dressing: Supervision/safety Where Assessed-Upper Body Dressing: Wheelchair Lower Body Dressing: Minimal cueing, Contact guard Where Assessed-Lower Body Dressing: Edge of bed Toileting: Supervision/safety, Minimal cueing Where Assessed-Toileting: Toilet Toilet Transfer: Close supervision Toilet Transfer Method: Ambulating Toilet Transfer Equipment: Grab bars Walk-In Shower Transfer: Contact guard Walk-In Shower Transfer Method: Ambulating Walk-In Shower  Equipment: Shower seat with back, Grab bars Vision Baseline Vision/History: Cataracts;Wears glasses Wears Glasses: Reading only Patient Visual Report: No change from baseline Vision Assessment?: No apparent visual deficits Perception  Perception: Within Functional Limits Praxis Praxis: Intact Cognition Overall Cognitive Status: Within Functional Limits for tasks assessed Arousal/Alertness: Awake/alert Orientation Level: Oriented X4 Attention: Sustained Sustained Attention: Appears intact Memory: Appears intact Awareness: Appears intact Awareness Impairment: Anticipatory impairment Problem Solving: Appears intact Safety/Judgment: Appears intact Sensation Sensation Light Touch: Impaired Detail Central sensation comments: numbness to R LE/UE Light Touch Impaired Details: Impaired RLE;Impaired RUE Additional Comments: impaired sensation R side Coordination Gross Motor Movements are Fluid and Coordinated: No Fine Motor Movements are Fluid and Coordinated: No Motor  Motor Motor: Hemiplegia Motor - Discharge Observations: R hemiparesis  Mobility  Bed Mobility Bed Mobility: Rolling Right;Rolling Left;Supine to Sit;Sit to Supine Rolling Right: Supervision/verbal cueing Rolling Left: Supervision/Verbal cueing Supine to Sit: Supervision/Verbal cueing Sit to Supine: Supervision/Verbal cueing Transfers Sit to Stand: Supervision/Verbal cueing Stand to Sit: Supervision/Verbal cueing  Trunk/Postural Assessment  Cervical Assessment Cervical Assessment: Exceptions to WFL(forward head) Thoracic Assessment Thoracic Assessment: Exceptions to WFL(kyphosis) Lumbar Assessment Lumbar Assessment: Exceptions to WFL(posterior pelvic tilt) Postural Control Postural Control: Deficits on evaluation Trunk Control: L lateral lean Protective Responses: impaired  Balance Balance Balance Assessed: Yes Static Sitting Balance Static Sitting - Balance Support: Feet supported;No upper extremity  supported Static Sitting - Level of Assistance: 5: Stand by assistance Dynamic Sitting Balance Dynamic Sitting - Balance Support: Feet supported;During functional activity Dynamic Sitting - Level of Assistance: 5: Stand by assistance Dynamic Sitting - Balance Activities: Reaching across midline Static Standing Balance Static Standing - Balance Support: During functional activity;Bilateral upper extremity supported Static Standing - Level of Assistance: 5: Stand by assistance Dynamic Standing Balance Dynamic Standing - Balance Support: During functional activity;Bilateral upper extremity supported Dynamic Standing - Level of Assistance: 4: Min assist Dynamic Standing - Balance Activities: Reaching across midline Extremity/Trunk Assessment RUE Assessment RUE Assessment:   Exceptions to [facility] Active Range of Motion (AROM) Comments: [facility] Strength Comments: Coordination deficits  LUE Assessment LUE Assessment: Within Functional Limits   Curtis Sites 12/06/2018, 7:12 AM

## 2018-12-06 NOTE — Consult Note (Signed)
Neuropsychological Consultation   Patient:   [name] [name]   DOB:   October 19, 1932  MR Number:  [id number]  Location:  [place] [place] [id number] [place] [address] Dept: [place]: [phone]           Date of Service:   12/06/2018  Start Time:   10 AM End Time:   11 AM  Provider/Observer:  Ilean Skill, Psy.D.       Clinical Neuropsychologist       Billing Code/Service: [phone]  Chief Complaint:    [name] [name] is an 85 year old female with history of hypothyroidism and had been living on her own.  The patient was independent and active prior to admission.  The patient presented on 11/18/2018 with right-sided weakness and fall.  Cranial CT scan showed small linear right left frontal white matter hypodensity extending to the cortex.  Patient did not receive TPA.  MRI/MRA showed acute lacunar infarction in the posterior left corona radiata and posterior lentiform with 2 small superimposed small acute white matter infarctions in the posterior left MCA territory.  The patient has had a lot of frustration and coping issues with residual physical and cognitive deficits post stroke.  There is a lot of anxiety and worry on the patient's part regarding her ability to return back to her independent functioning.  Reason for Service:  The patient was referred for neuropsychological consult due to coping and adjustment issues following her stroke.  Below is the HPI for the current admission.  [name] [name] is an 85 year old right-handed female history of hypothyroidism. Per chart review she lives alone. One level home with level entry. Independent and active prior to admission. She has a son who is retired and offers good support.Presented 11/18/2018 with right-sided weakness following to the floor. Cranial CT scan showed a small linear right left frontal white matter hypodensity extending to the  cortex. No hemorrhage. Patient did not receive TPA. MRI/MRA showed acute lacunar infarction in the posterior left corona radiata and posterior lentiform with 2 small superimposed small acute white matter infarctions in the posterior left MCA territory. No large vessel occlusion but positive for moderate stenosis of both the distal left A2 and left anterior M2 branches. Echocardiogram with ejection fraction of 65%. Systolic function was normal. Carotid Dopplers with no ICA stenosis. Neurology follow-up maintain on aspirin for CVA prophylaxis. TEE is pending. Subcutaneous Lovenox for DVT prophylaxis. Therapy evaluations completed with recommendations of physical medicine rehabilitation consult. Patient was admitted for a rehabilitation program.  Current Status:  The patient is getting discharged today.  The patient reports that mood continues to be stable and that she is not experiencing any increase in anxiety.  The patient reports that she is disappointed that she has not made faster recovery but acknowledges that she has been rather unrealistic in her initial goals but she has been able to see progress particularly with her right arm and right hand and is hoping to be able to continue with those therapies and progress at the skilled nursing facility.  The patient reports that she is comfortable with going to a skilled nursing facility and knows that that is the best next step for her in her recovery.  Behavioral Observation: [name] [name]  presents as a 85 y.o.-year-old Right Caucasian Female who appeared her stated age. her dress was Appropriate and she was Well Groomed and her manners were Appropriate to the situation.  her participation was indicative of Appropriate and Attentive behaviors.  There were any physical disabilities noted.  she displayed an appropriate level of cooperation and motivation.     Interactions:    Active Appropriate and Attentive  Attention:   abnormal and attention span  appeared shorter than expected for age  Memory:   abnormal; remote memory intact, recent memory impaired  Visuo-spatial:  not examined  Speech (Volume):  low  Speech:   normal; normal  Thought Process:  Coherent and Relevant  Though Content:  WNL; not suicidal and not homicidal  Orientation:   person, place, time/date and situation  Judgment:   Good  Planning:   Fair  Affect:    Anxious  Mood:    Anxious  Insight:   Good  Intelligence:   normal  Medical History:   Past Medical History:  Diagnosis Date  . Anemia    h/o fibroids  . Bipolar disorder ([place])   . Depression    bipolar  . Eczema   . Hypothyroidism   . Insomnia   . Osteoarthritis   . Osteoporosis   . Psoriasis              Psychiatric History:  The patient does have a prior medical history including bipolar disorder and depressive disorder.  The patient reports that she does feel like the loss of function that she is quite concerned about is playing a role but she denies feel like she is any type of manic or depressive state but that she is more dysphoric regarding fears of loss of independence.  Family Med/Psych History:  Family History  Problem Relation Age of Onset  . Colon cancer Brother   . Lung cancer Father   . Prostate cancer Father   . Arthritis Father   . Cancer Father        lung, prostate  . Heart disease Brother   . Cancer Brother        lung  . Lung cancer Brother   . Aortic aneurysm Brother   . Cancer Other        colon    Risk of Suicide/Violence: low the patient denies any suicidal or homicidal ideation.  Impression/DX: [name] [name] is an 85 year old female with history of hypothyroidism and had been living on her own.  The patient was independent and active prior to admission.  The patient presented on 11/18/2018 with right-sided weakness and fall.  Cranial CT scan showed small linear right left frontal white matter hypodensity extending to the cortex.  Patient did not  receive TPA.  MRI/MRA showed acute lacunar infarction in the posterior left corona radiata and posterior lentiform with 2 small superimposed small acute white matter infarctions in the posterior left MCA territory.  The patient has had a lot of frustration and coping issues with residual physical and cognitive deficits post stroke.  There is a lot of anxiety and worry on the patient's part regarding her ability to return back to her independent functioning.  The patient is getting discharged today.  The patient reports that mood continues to be stable and that she is not experiencing any increase in anxiety.  The patient reports that she is disappointed that she has not made faster recovery but acknowledges that she has been rather unrealistic in her initial goals but she has been able to see progress particularly with her right arm and right hand and is hoping to be able to continue with those therapies and progress at the  skilled nursing facility.  The patient reports that she is comfortable with going to a skilled nursing facility and knows that that is the best next step for her in her recovery.           Electronically Signed   _______________________ Ilean Skill, Psy.D.

## 2018-12-07 ENCOUNTER — Inpatient Hospital Stay (HOSPITAL_COMMUNITY): Payer: Medicare Other | Admitting: Physical Therapy

## 2018-12-07 DIAGNOSIS — F39 Unspecified mood [affective] disorder: Secondary | ICD-10-CM | POA: Diagnosis not present

## 2018-12-07 DIAGNOSIS — R0989 Other specified symptoms and signs involving the circulatory and respiratory systems: Secondary | ICD-10-CM | POA: Diagnosis not present

## 2018-12-07 DIAGNOSIS — E038 Other specified hypothyroidism: Secondary | ICD-10-CM | POA: Diagnosis not present

## 2018-12-07 DIAGNOSIS — I63519 Cerebral infarction due to unspecified occlusion or stenosis of unspecified middle cerebral artery: Secondary | ICD-10-CM | POA: Diagnosis not present

## 2018-12-07 DIAGNOSIS — I119 Hypertensive heart disease without heart failure: Secondary | ICD-10-CM | POA: Diagnosis not present

## 2018-12-07 NOTE — Telephone Encounter (Signed)
Transmission received AF episodes false

## 2018-12-08 NOTE — Progress Notes (Signed)
Social Work Discharge Note  The overall goal for the admission was met for:   Discharge location: No - pt and son changed pt's d/c plan to SNF  Length of Stay: Yes - 16 days  Discharge activity level: No - supervision to min A  Home/community participation: No  Services provided included: MD, RD, PT, OT, SLP, RN, Pharmacy, Neuropsych and SW  Financial Services: Medicare and Private Insurance: [place] [employer]  Follow-up services arranged: Other: Pt tx'd to [facility] for short term SNF care.  Comments (or additional information):  Pt's son transported pt to [facility] via his vehicle.  Pt was in agreement with ST SNF stay.   Marleena Shubert - son - [phone]  Patient/Family verbalized understanding of follow-up arrangements: Yes  Individual responsible for coordination of the follow-up plan: pt's son, [name]  Confirmed correct DME delivered: Trey Sailors 12/08/2018    Radiance Deady, Silvestre Mesi

## 2018-12-11 ENCOUNTER — Telehealth: Payer: Self-pay

## 2018-12-11 NOTE — Telephone Encounter (Signed)
Manual transmission received and reviewed. 5 "AF" episodes are all false--ECGs show SR w/ectopy. Will review with Dr. Lovena Le for reprogramming recommendations.

## 2018-12-11 NOTE — Telephone Encounter (Signed)
I helped the pt nurse to send a manual transmission. I told her if the nurse see anything she will give her a call back.

## 2018-12-13 ENCOUNTER — Other Ambulatory Visit: Payer: Self-pay | Admitting: *Deleted

## 2018-12-13 DIAGNOSIS — I119 Hypertensive heart disease without heart failure: Secondary | ICD-10-CM | POA: Diagnosis not present

## 2018-12-13 DIAGNOSIS — R0989 Other specified symptoms and signs involving the circulatory and respiratory systems: Secondary | ICD-10-CM | POA: Diagnosis not present

## 2018-12-13 DIAGNOSIS — I63519 Cerebral infarction due to unspecified occlusion or stenosis of unspecified middle cerebral artery: Secondary | ICD-10-CM | POA: Diagnosis not present

## 2018-12-13 DIAGNOSIS — F39 Unspecified mood [affective] disorder: Secondary | ICD-10-CM | POA: Diagnosis not present

## 2018-12-13 NOTE — Patient Outreach (Signed)
[facility]) Care Management  12/13/2018  BAYLA MCGOVERN 14-May-1933 [id number]   Facility site visit to [facility] and [place]. Collaboration with IDT and [place] Team Member concerning patient's progress, discharge plan and potential care management needs. PT states patient is highly motivated to return to her previous independence and is exercising as much as possible. D/C planner states patient has good family support, her son and daughter in law live close by.  Patient admitted to SNF on 12/06/18 after a hospitalization for CVA affecting her Right side. Planned discharge date is not set and planned discharge disposition is ILF Economist at [facility].  Patient will have [place] at discharge. Patient was evaluated for community based chronic disease management services with [facility] care Management Program as a benefit of patient's Next Gen Medicare.   Plan to see patient at next facility site visit when patient is closer to discharge and plan is better known.  For questions please contact:   Hansini Clodfelter RN, [facility] Liaison  [phone]) Business Mobile [phone]) Toll free office

## 2018-12-13 NOTE — Telephone Encounter (Signed)
Recommended reprogramming per Dr. Lovena Le:  Reprogram AF detection to less sensitive. Extend AF episode storage to episodes >= 38min.  Routed to scheduler for assistance. Pt is overdue for ILR wound check (11/30/18 appointment canceled as pt admitted to inpatient rehab).

## 2018-12-20 ENCOUNTER — Telehealth: Payer: Self-pay

## 2018-12-20 ENCOUNTER — Other Ambulatory Visit: Payer: Self-pay | Admitting: *Deleted

## 2018-12-20 NOTE — Telephone Encounter (Signed)
I called [facility] to get the patient to send a manual transmission with her home monitor but did not get an answer.

## 2018-12-21 NOTE — Telephone Encounter (Signed)
Attempted to call [facility]. Call was transferred. No answer and unable to leave a message once the fall was transferred.

## 2018-12-22 NOTE — Telephone Encounter (Signed)
Manual transmission has been sent and received 3/6 @ 1448.

## 2018-12-22 NOTE — Telephone Encounter (Signed)
Manual transmission reviewed. All AF episodes are false. Will continue to monitor.

## 2018-12-22 NOTE — Patient Outreach (Signed)
[facility]) Care Management  12/20/18  [name] [name] [birth year] [id number]   Facility site visit to [facility] and [place]. Collaboration with IDT and [place] Team Member concerning patient's progress, discharge plan and potential care management needs. PT states patient is highly motivated to return to her previous independence and is exercising as much as possible. D/C planner states patient has good family support, her son and daughter in law live close by.  Patient admitted to SNF on 12/06/18 after a hospitalization for CVA affecting her Right side. Planned discharge date is this weekend and planned discharge disposition is ILF Economist at [facility].  Patient will have [place] at discharge. Patient was evaluated for community based chronic disease management services with [facility] care Management Program as a benefit of patient's Next Gen Medicare.   Went to patient's bedside to introduce [facility] CM.  Patient denied transportation or medication management needs stating her son [name] lives close by and is extremely supportive. Introduced [facility] CM needs and gave patient information with contact information.  Patient denied CM needs at this time.   Rutherford Limerick RN, BSN [place] Acute Care Coordinator [phone]) Business Mobile [phone]) Toll free office

## 2018-12-25 ENCOUNTER — Ambulatory Visit (INDEPENDENT_AMBULATORY_CARE_PROVIDER_SITE_OTHER): Payer: Medicare Other | Admitting: *Deleted

## 2018-12-25 DIAGNOSIS — I6300 Cerebral infarction due to thrombosis of unspecified precerebral artery: Secondary | ICD-10-CM

## 2018-12-26 DIAGNOSIS — E038 Other specified hypothyroidism: Secondary | ICD-10-CM | POA: Diagnosis not present

## 2018-12-26 DIAGNOSIS — I119 Hypertensive heart disease without heart failure: Secondary | ICD-10-CM | POA: Diagnosis not present

## 2018-12-26 DIAGNOSIS — I63519 Cerebral infarction due to unspecified occlusion or stenosis of unspecified middle cerebral artery: Secondary | ICD-10-CM | POA: Diagnosis not present

## 2018-12-26 DIAGNOSIS — F3489 Other specified persistent mood disorders: Secondary | ICD-10-CM | POA: Diagnosis not present

## 2018-12-26 LAB — CUP PACEART REMOTE DEVICE CHECK
Date Time Interrogation Session: 20200307204034
Implantable Pulse Generator Implant Date: 20200203

## 2018-12-27 DIAGNOSIS — Z95818 Presence of other cardiac implants and grafts: Secondary | ICD-10-CM | POA: Diagnosis not present

## 2018-12-27 DIAGNOSIS — I69351 Hemiplegia and hemiparesis following cerebral infarction affecting right dominant side: Secondary | ICD-10-CM | POA: Diagnosis not present

## 2018-12-27 DIAGNOSIS — G4709 Other insomnia: Secondary | ICD-10-CM | POA: Diagnosis not present

## 2018-12-27 DIAGNOSIS — L309 Dermatitis, unspecified: Secondary | ICD-10-CM | POA: Diagnosis not present

## 2018-12-27 DIAGNOSIS — Z96652 Presence of left artificial knee joint: Secondary | ICD-10-CM | POA: Diagnosis not present

## 2018-12-27 DIAGNOSIS — E039 Hypothyroidism, unspecified: Secondary | ICD-10-CM | POA: Diagnosis not present

## 2018-12-27 DIAGNOSIS — Z87891 Personal history of nicotine dependence: Secondary | ICD-10-CM | POA: Diagnosis not present

## 2018-12-27 DIAGNOSIS — Z7982 Long term (current) use of aspirin: Secondary | ICD-10-CM | POA: Diagnosis not present

## 2018-12-27 DIAGNOSIS — E785 Hyperlipidemia, unspecified: Secondary | ICD-10-CM | POA: Diagnosis not present

## 2018-12-27 DIAGNOSIS — Z9181 History of falling: Secondary | ICD-10-CM | POA: Diagnosis not present

## 2018-12-27 DIAGNOSIS — I119 Hypertensive heart disease without heart failure: Secondary | ICD-10-CM | POA: Diagnosis not present

## 2018-12-27 DIAGNOSIS — F39 Unspecified mood [affective] disorder: Secondary | ICD-10-CM | POA: Diagnosis not present

## 2018-12-28 ENCOUNTER — Telehealth: Payer: Self-pay | Admitting: Family Medicine

## 2018-12-28 DIAGNOSIS — G4709 Other insomnia: Secondary | ICD-10-CM | POA: Diagnosis not present

## 2018-12-28 DIAGNOSIS — E039 Hypothyroidism, unspecified: Secondary | ICD-10-CM | POA: Diagnosis not present

## 2018-12-28 DIAGNOSIS — I69351 Hemiplegia and hemiparesis following cerebral infarction affecting right dominant side: Secondary | ICD-10-CM | POA: Diagnosis not present

## 2018-12-28 DIAGNOSIS — E785 Hyperlipidemia, unspecified: Secondary | ICD-10-CM | POA: Diagnosis not present

## 2018-12-28 DIAGNOSIS — F39 Unspecified mood [affective] disorder: Secondary | ICD-10-CM | POA: Diagnosis not present

## 2018-12-28 DIAGNOSIS — I119 Hypertensive heart disease without heart failure: Secondary | ICD-10-CM | POA: Diagnosis not present

## 2018-12-28 NOTE — Telephone Encounter (Signed)
Copied from [place] [id number]. Topic: Quick Communication - Home Health Verbal Orders >> Dec 28, 2018 11:39 AM Gustavus Messing wrote: Caller/Agency: Santina Evans Number: [phone] Sree Requesting OT/PT/Skilled Nursing/Social Work/Speech Therapy: PT for 9 weeks Frequency: 1 week 1 2 week 2 2 week 3 1 week 4 1 week 5 2 week 6 2 week 7 1 week 8 1 week 9

## 2018-12-29 ENCOUNTER — Telehealth: Payer: Self-pay | Admitting: Family Medicine

## 2018-12-29 DIAGNOSIS — E039 Hypothyroidism, unspecified: Secondary | ICD-10-CM | POA: Diagnosis not present

## 2018-12-29 DIAGNOSIS — F39 Unspecified mood [affective] disorder: Secondary | ICD-10-CM | POA: Diagnosis not present

## 2018-12-29 DIAGNOSIS — G4709 Other insomnia: Secondary | ICD-10-CM | POA: Diagnosis not present

## 2018-12-29 DIAGNOSIS — I69351 Hemiplegia and hemiparesis following cerebral infarction affecting right dominant side: Secondary | ICD-10-CM | POA: Diagnosis not present

## 2018-12-29 DIAGNOSIS — I119 Hypertensive heart disease without heart failure: Secondary | ICD-10-CM | POA: Diagnosis not present

## 2018-12-29 DIAGNOSIS — E785 Hyperlipidemia, unspecified: Secondary | ICD-10-CM | POA: Diagnosis not present

## 2018-12-29 NOTE — Telephone Encounter (Signed)
LMOM w/ verbal orders.  

## 2018-12-29 NOTE — Telephone Encounter (Signed)
Spoke w/ Don, verbal orders given.  

## 2018-12-29 NOTE — Telephone Encounter (Signed)
Copied from [place] [id number]. Topic: Quick Communication - Home Health Verbal Orders >> Dec 29, 2018 10:09 AM Percell Belt A wrote: Caller/Agency: Tommi Emery / Land[facility] Corporal  Callback Number: [phone] Requesting OT/PT/Skilled Nursing/Social Work/Speech Therapy: Need Verbals for OT- for ADL, IADL, Tranfers and exercise   Frequency:  2 week 1 1 week 2 0 week 1 1 week 1

## 2019-01-01 DIAGNOSIS — I69351 Hemiplegia and hemiparesis following cerebral infarction affecting right dominant side: Secondary | ICD-10-CM | POA: Diagnosis not present

## 2019-01-01 DIAGNOSIS — F39 Unspecified mood [affective] disorder: Secondary | ICD-10-CM | POA: Diagnosis not present

## 2019-01-01 DIAGNOSIS — I119 Hypertensive heart disease without heart failure: Secondary | ICD-10-CM | POA: Diagnosis not present

## 2019-01-01 DIAGNOSIS — G4709 Other insomnia: Secondary | ICD-10-CM | POA: Diagnosis not present

## 2019-01-01 DIAGNOSIS — E785 Hyperlipidemia, unspecified: Secondary | ICD-10-CM | POA: Diagnosis not present

## 2019-01-01 DIAGNOSIS — E039 Hypothyroidism, unspecified: Secondary | ICD-10-CM | POA: Diagnosis not present

## 2019-01-02 ENCOUNTER — Other Ambulatory Visit: Payer: Self-pay | Admitting: Internal Medicine

## 2019-01-02 NOTE — Progress Notes (Signed)
Carelink Summary Report / Loop Recorder 

## 2019-01-04 ENCOUNTER — Inpatient Hospital Stay: Payer: Medicare Other | Admitting: Family Medicine

## 2019-01-04 DIAGNOSIS — I69351 Hemiplegia and hemiparesis following cerebral infarction affecting right dominant side: Secondary | ICD-10-CM | POA: Diagnosis not present

## 2019-01-04 DIAGNOSIS — I119 Hypertensive heart disease without heart failure: Secondary | ICD-10-CM | POA: Diagnosis not present

## 2019-01-04 DIAGNOSIS — E039 Hypothyroidism, unspecified: Secondary | ICD-10-CM | POA: Diagnosis not present

## 2019-01-04 DIAGNOSIS — E785 Hyperlipidemia, unspecified: Secondary | ICD-10-CM | POA: Diagnosis not present

## 2019-01-04 DIAGNOSIS — F39 Unspecified mood [affective] disorder: Secondary | ICD-10-CM | POA: Diagnosis not present

## 2019-01-04 DIAGNOSIS — G4709 Other insomnia: Secondary | ICD-10-CM | POA: Diagnosis not present

## 2019-01-08 ENCOUNTER — Telehealth: Payer: Self-pay | Admitting: Cardiology

## 2019-01-08 NOTE — Telephone Encounter (Signed)
Spoke w/ pt and attempted to help her send a manual transmission w/ her home monitor. Pt could not see the buttons. She will have sitter there with her at 4:30 today she asked that we call back shortly after 4:30 so her sitter can help with the manual transmission.

## 2019-01-09 ENCOUNTER — Telehealth: Payer: Self-pay | Admitting: *Deleted

## 2019-01-09 ENCOUNTER — Inpatient Hospital Stay: Payer: Medicare Other | Admitting: Physical Medicine & Rehabilitation

## 2019-01-09 DIAGNOSIS — E785 Hyperlipidemia, unspecified: Secondary | ICD-10-CM | POA: Diagnosis not present

## 2019-01-09 DIAGNOSIS — I119 Hypertensive heart disease without heart failure: Secondary | ICD-10-CM | POA: Diagnosis not present

## 2019-01-09 DIAGNOSIS — G4709 Other insomnia: Secondary | ICD-10-CM | POA: Diagnosis not present

## 2019-01-09 DIAGNOSIS — I69351 Hemiplegia and hemiparesis following cerebral infarction affecting right dominant side: Secondary | ICD-10-CM | POA: Diagnosis not present

## 2019-01-09 DIAGNOSIS — E039 Hypothyroidism, unspecified: Secondary | ICD-10-CM | POA: Diagnosis not present

## 2019-01-09 DIAGNOSIS — F39 Unspecified mood [affective] disorder: Secondary | ICD-10-CM | POA: Diagnosis not present

## 2019-01-09 NOTE — Telephone Encounter (Signed)
Manual transmission received and reviewed. Most ECGs suggest SR w/ectopy and artifact, some possible MAT.   Episode below routed to Dr. Lovena Le for review due to runs of ?AF vs frequent ectopy. LINQ implanted for cryptogenic stroke.

## 2019-01-09 NOTE — Telephone Encounter (Signed)
Received Home Health Certification and Plan of Care; forwarded to provider/SLS 03/24  

## 2019-01-10 NOTE — Telephone Encounter (Signed)
Noise and PAC's and PVC's

## 2019-01-11 ENCOUNTER — Ambulatory Visit (INDEPENDENT_AMBULATORY_CARE_PROVIDER_SITE_OTHER): Payer: Medicare Other | Admitting: Family Medicine

## 2019-01-11 ENCOUNTER — Other Ambulatory Visit: Payer: Self-pay

## 2019-01-11 DIAGNOSIS — E039 Hypothyroidism, unspecified: Secondary | ICD-10-CM | POA: Diagnosis not present

## 2019-01-11 DIAGNOSIS — Z8673 Personal history of transient ischemic attack (TIA), and cerebral infarction without residual deficits: Secondary | ICD-10-CM | POA: Diagnosis not present

## 2019-01-11 DIAGNOSIS — J449 Chronic obstructive pulmonary disease, unspecified: Secondary | ICD-10-CM

## 2019-01-11 NOTE — Assessment & Plan Note (Signed)
Patient has been in [place] at [facility] and is doing well back at home with her family and doing well. Will followup in 3 months or as needed and we will do labs at that time. On Levothyroxine, continue to monitor

## 2019-01-12 DIAGNOSIS — G4709 Other insomnia: Secondary | ICD-10-CM | POA: Diagnosis not present

## 2019-01-12 DIAGNOSIS — F39 Unspecified mood [affective] disorder: Secondary | ICD-10-CM | POA: Diagnosis not present

## 2019-01-12 DIAGNOSIS — I69351 Hemiplegia and hemiparesis following cerebral infarction affecting right dominant side: Secondary | ICD-10-CM | POA: Diagnosis not present

## 2019-01-12 DIAGNOSIS — E785 Hyperlipidemia, unspecified: Secondary | ICD-10-CM | POA: Diagnosis not present

## 2019-01-12 DIAGNOSIS — I119 Hypertensive heart disease without heart failure: Secondary | ICD-10-CM | POA: Diagnosis not present

## 2019-01-12 DIAGNOSIS — E039 Hypothyroidism, unspecified: Secondary | ICD-10-CM | POA: Diagnosis not present

## 2019-01-13 ENCOUNTER — Encounter: Payer: Self-pay | Admitting: Family Medicine

## 2019-01-13 NOTE — Assessment & Plan Note (Signed)
She feels ell in this regard.

## 2019-01-13 NOTE — Assessment & Plan Note (Addendum)
Patient with lacunar infarct of posterior L corona radiata and  Posterior lentiform and posterior Left MCA territory. She is home now with family after a hospitalization and a stay at the [place]. She received PT/OT care there and is now receiving it at home. She is not sure the name of her home health agency but she is advised we would sign orders for ongoing treatment if she needs it. No change to therapy at times. She has been left with right arm and leg weakness. She is able to ambulate some with assistance/walker. She uses WC frequently. She needs help with bathing and cooking and her son and daughter in law are caring for her.

## 2019-01-13 NOTE — Progress Notes (Signed)
Virtual Visit via Telephone Note  I connected with Miles Costain on 01/13/19 at 12:00 PM EDT by telephone and verified that I am speaking with the correct person using two identifiers.   I discussed the limitations, risks, security and privacy concerns of performing an evaluation and management service by telephone and the availability of in person appointments. I also discussed with the patient that there may be a patient responsible charge related to this service. The patient expressed understanding and agreed to proceed. Magdalene Molly, CMA attempts to get patient set up on Webex but is unsuccessful so she is set up for phone call.    History of Present Illness: Patient had a Left sided MCA region stroke in early February and spent a month in [place] receiving care including PT and OT. She is still receiving PT/OT at home under the care of her family. She uses Wheelchair mostly but has been able to use rolling walker recently as well. She is cared for by her son and daughter in law. She denies any recent febrile illness or other hospitalizations. Denies CP/palp/SOB/HA/congestion/fevers/GI or GU c/o. Taking meds as prescribed    Observations/Objective:   Assessment and Plan:   Follow Up Instructions:    I discussed the assessment and treatment plan with the patient. The patient was provided an opportunity to ask questions and all were answered. The patient agreed with the plan and demonstrated an understanding of the instructions.   The patient was advised to call back or seek an in-person evaluation if the symptoms worsen or if the condition fails to improve as anticipated.  I provided 23 minutes of non-face-to-face time during this encounter.   Penni Homans, MD

## 2019-01-15 ENCOUNTER — Telehealth: Payer: Self-pay | Admitting: Cardiology

## 2019-01-15 NOTE — Telephone Encounter (Signed)
Staff message received in paceart requeting that pt be called for a manual transmission. Spoke w/ pt and requested that she send a manual transmission w/ her home monitor. she stated that her son will come help her later today

## 2019-01-16 DIAGNOSIS — I119 Hypertensive heart disease without heart failure: Secondary | ICD-10-CM | POA: Diagnosis not present

## 2019-01-16 DIAGNOSIS — E039 Hypothyroidism, unspecified: Secondary | ICD-10-CM | POA: Diagnosis not present

## 2019-01-16 DIAGNOSIS — G4709 Other insomnia: Secondary | ICD-10-CM | POA: Diagnosis not present

## 2019-01-16 DIAGNOSIS — F39 Unspecified mood [affective] disorder: Secondary | ICD-10-CM | POA: Diagnosis not present

## 2019-01-16 DIAGNOSIS — E785 Hyperlipidemia, unspecified: Secondary | ICD-10-CM | POA: Diagnosis not present

## 2019-01-16 DIAGNOSIS — I69351 Hemiplegia and hemiparesis following cerebral infarction affecting right dominant side: Secondary | ICD-10-CM | POA: Diagnosis not present

## 2019-01-16 NOTE — Telephone Encounter (Signed)
Spoke w/ pt and requested that she send a manual transmission w/ her home monitor. She stated that she isn't feeling well and she will do it Wednesday 01-17-2019. She said her son will help her do it.

## 2019-01-17 NOTE — Telephone Encounter (Signed)
LMOVM requesting that pt send a manual transmission w/ her home monitor.   Spoke w/ pt son and requested that pt send a manual transmission w/ her home monitor. Instructed son on how to send a manual transmission. He stated that he would see what he could do.

## 2019-01-19 NOTE — Telephone Encounter (Signed)
Full transmission received. AF episodes are SR with runs of PAC's, no clear AF seen.  Will need to bring patient in for device reprogramming post COVID.

## 2019-01-23 ENCOUNTER — Ambulatory Visit: Payer: Medicare Other | Admitting: Podiatry

## 2019-01-24 ENCOUNTER — Telehealth: Payer: Self-pay

## 2019-01-24 DIAGNOSIS — G4709 Other insomnia: Secondary | ICD-10-CM | POA: Diagnosis not present

## 2019-01-24 DIAGNOSIS — I69351 Hemiplegia and hemiparesis following cerebral infarction affecting right dominant side: Secondary | ICD-10-CM | POA: Diagnosis not present

## 2019-01-24 DIAGNOSIS — E039 Hypothyroidism, unspecified: Secondary | ICD-10-CM | POA: Diagnosis not present

## 2019-01-24 DIAGNOSIS — E785 Hyperlipidemia, unspecified: Secondary | ICD-10-CM | POA: Diagnosis not present

## 2019-01-24 DIAGNOSIS — I119 Hypertensive heart disease without heart failure: Secondary | ICD-10-CM | POA: Diagnosis not present

## 2019-01-24 DIAGNOSIS — F39 Unspecified mood [affective] disorder: Secondary | ICD-10-CM | POA: Diagnosis not present

## 2019-01-24 NOTE — Telephone Encounter (Signed)
I left a message for the pt to send a manual transmission with her home monitor. I gave my direct office number for the pt to call if she has any questions.

## 2019-01-25 ENCOUNTER — Ambulatory Visit (INDEPENDENT_AMBULATORY_CARE_PROVIDER_SITE_OTHER): Payer: Medicare Other | Admitting: *Deleted

## 2019-01-25 ENCOUNTER — Other Ambulatory Visit: Payer: Self-pay

## 2019-01-25 DIAGNOSIS — I6300 Cerebral infarction due to thrombosis of unspecified precerebral artery: Secondary | ICD-10-CM | POA: Diagnosis not present

## 2019-01-26 ENCOUNTER — Telehealth: Payer: Self-pay | Admitting: Family Medicine

## 2019-01-26 DIAGNOSIS — E785 Hyperlipidemia, unspecified: Secondary | ICD-10-CM | POA: Diagnosis not present

## 2019-01-26 DIAGNOSIS — Z95818 Presence of other cardiac implants and grafts: Secondary | ICD-10-CM | POA: Diagnosis not present

## 2019-01-26 DIAGNOSIS — Z9181 History of falling: Secondary | ICD-10-CM | POA: Diagnosis not present

## 2019-01-26 DIAGNOSIS — Z96652 Presence of left artificial knee joint: Secondary | ICD-10-CM | POA: Diagnosis not present

## 2019-01-26 DIAGNOSIS — F39 Unspecified mood [affective] disorder: Secondary | ICD-10-CM | POA: Diagnosis not present

## 2019-01-26 DIAGNOSIS — Z7982 Long term (current) use of aspirin: Secondary | ICD-10-CM | POA: Diagnosis not present

## 2019-01-26 DIAGNOSIS — I119 Hypertensive heart disease without heart failure: Secondary | ICD-10-CM | POA: Diagnosis not present

## 2019-01-26 DIAGNOSIS — G4709 Other insomnia: Secondary | ICD-10-CM | POA: Diagnosis not present

## 2019-01-26 DIAGNOSIS — Z87891 Personal history of nicotine dependence: Secondary | ICD-10-CM | POA: Diagnosis not present

## 2019-01-26 DIAGNOSIS — E039 Hypothyroidism, unspecified: Secondary | ICD-10-CM | POA: Diagnosis not present

## 2019-01-26 DIAGNOSIS — L309 Dermatitis, unspecified: Secondary | ICD-10-CM | POA: Diagnosis not present

## 2019-01-26 DIAGNOSIS — I69351 Hemiplegia and hemiparesis following cerebral infarction affecting right dominant side: Secondary | ICD-10-CM | POA: Diagnosis not present

## 2019-01-26 LAB — CUP PACEART REMOTE DEVICE CHECK
Date Time Interrogation Session: 20200409204114
Implantable Pulse Generator Implant Date: 20200203

## 2019-01-26 NOTE — Telephone Encounter (Signed)
Copied from [place] [id number]. Topic: Quick Communication - Home Health Verbal Orders >> Jan 26, 2019  3:55 PM Berneta Levins wrote: Caller/Agency: Tommi Emery with [facility] Number: [phone], [place] to leave a message Requesting OT/PT/Skilled Nursing/Social Work/Speech Therapy: OT Frequency: extend one visit to complete goals for laundry and meal prep week of 02/04/2019

## 2019-01-29 ENCOUNTER — Telehealth: Payer: Self-pay | Admitting: Cardiology

## 2019-01-29 DIAGNOSIS — I69351 Hemiplegia and hemiparesis following cerebral infarction affecting right dominant side: Secondary | ICD-10-CM | POA: Diagnosis not present

## 2019-01-29 DIAGNOSIS — G4709 Other insomnia: Secondary | ICD-10-CM | POA: Diagnosis not present

## 2019-01-29 DIAGNOSIS — I119 Hypertensive heart disease without heart failure: Secondary | ICD-10-CM | POA: Diagnosis not present

## 2019-01-29 DIAGNOSIS — F39 Unspecified mood [affective] disorder: Secondary | ICD-10-CM | POA: Diagnosis not present

## 2019-01-29 DIAGNOSIS — E785 Hyperlipidemia, unspecified: Secondary | ICD-10-CM | POA: Diagnosis not present

## 2019-01-29 DIAGNOSIS — E039 Hypothyroidism, unspecified: Secondary | ICD-10-CM | POA: Diagnosis not present

## 2019-01-29 NOTE — Telephone Encounter (Signed)
Spoke w/ Don- verbal orders given.  

## 2019-01-29 NOTE — Telephone Encounter (Signed)
Spoke w/ pt son and requested that pt send a manual transmission w/ her home monitor. He stated that he would do it on Tuesday 01/30/2019.

## 2019-01-31 NOTE — Telephone Encounter (Signed)
Manual transmission received and reviewed. All available "AF" ECGs appear SR w/frequent ectopy. No true AF is noted. Will continue to monitor remotely via Carelink.

## 2019-02-01 DIAGNOSIS — I69351 Hemiplegia and hemiparesis following cerebral infarction affecting right dominant side: Secondary | ICD-10-CM | POA: Diagnosis not present

## 2019-02-01 DIAGNOSIS — E039 Hypothyroidism, unspecified: Secondary | ICD-10-CM | POA: Diagnosis not present

## 2019-02-01 DIAGNOSIS — I119 Hypertensive heart disease without heart failure: Secondary | ICD-10-CM | POA: Diagnosis not present

## 2019-02-01 DIAGNOSIS — F39 Unspecified mood [affective] disorder: Secondary | ICD-10-CM | POA: Diagnosis not present

## 2019-02-01 DIAGNOSIS — G4709 Other insomnia: Secondary | ICD-10-CM | POA: Diagnosis not present

## 2019-02-01 DIAGNOSIS — E785 Hyperlipidemia, unspecified: Secondary | ICD-10-CM | POA: Diagnosis not present

## 2019-02-01 NOTE — Telephone Encounter (Signed)
Transmission received 01/31/2019

## 2019-02-02 ENCOUNTER — Telehealth: Payer: Self-pay | Admitting: Cardiology

## 2019-02-02 ENCOUNTER — Telehealth: Payer: Self-pay | Admitting: *Deleted

## 2019-02-02 NOTE — Telephone Encounter (Signed)
Received Physician Orders from [facility]; forwarded to provider/SLS 04/17

## 2019-02-02 NOTE — Telephone Encounter (Signed)
I spoke with the pt and requested that she start sending a manual transmission with her home monitor once a week. The pt asked me to talk to her son Mysty Kielty and provided his phone number to call [phone]. I left a message on her son voicemail and gave him my direct office number to call back. The device tech nurse wants the pt to start sending Once a week until we can get her to come into the office and get reprogrammed.

## 2019-02-02 NOTE — Telephone Encounter (Signed)
LMOVM for pt son to return call. We need pt to send manual transmission weekly.

## 2019-02-05 DIAGNOSIS — F39 Unspecified mood [affective] disorder: Secondary | ICD-10-CM | POA: Diagnosis not present

## 2019-02-05 DIAGNOSIS — I69351 Hemiplegia and hemiparesis following cerebral infarction affecting right dominant side: Secondary | ICD-10-CM | POA: Diagnosis not present

## 2019-02-05 DIAGNOSIS — E785 Hyperlipidemia, unspecified: Secondary | ICD-10-CM | POA: Diagnosis not present

## 2019-02-05 DIAGNOSIS — I119 Hypertensive heart disease without heart failure: Secondary | ICD-10-CM | POA: Diagnosis not present

## 2019-02-05 DIAGNOSIS — G4709 Other insomnia: Secondary | ICD-10-CM | POA: Diagnosis not present

## 2019-02-05 DIAGNOSIS — E039 Hypothyroidism, unspecified: Secondary | ICD-10-CM | POA: Diagnosis not present

## 2019-02-05 NOTE — Progress Notes (Signed)
Carelink Summary Report / Loop Recorder 

## 2019-02-05 NOTE — Telephone Encounter (Signed)
LMOVM for pt son to return call 

## 2019-02-06 NOTE — Telephone Encounter (Signed)
3rd attempt.   LMOVM for pt son to return call.

## 2019-02-08 DIAGNOSIS — I119 Hypertensive heart disease without heart failure: Secondary | ICD-10-CM | POA: Diagnosis not present

## 2019-02-08 DIAGNOSIS — F39 Unspecified mood [affective] disorder: Secondary | ICD-10-CM | POA: Diagnosis not present

## 2019-02-08 DIAGNOSIS — G4709 Other insomnia: Secondary | ICD-10-CM | POA: Diagnosis not present

## 2019-02-08 DIAGNOSIS — E039 Hypothyroidism, unspecified: Secondary | ICD-10-CM | POA: Diagnosis not present

## 2019-02-08 DIAGNOSIS — E785 Hyperlipidemia, unspecified: Secondary | ICD-10-CM | POA: Diagnosis not present

## 2019-02-08 DIAGNOSIS — I69351 Hemiplegia and hemiparesis following cerebral infarction affecting right dominant side: Secondary | ICD-10-CM | POA: Diagnosis not present

## 2019-02-08 NOTE — Telephone Encounter (Signed)
Spoke with patient to advise that we have not been able to reach [place] to request a manual Carelink transmission. Advised that we recommend LINQ reprogramming to cut down on false episode detections and need for manual transmissions. Pt reports she is not able to come out as she cannot walk. Clarified that she uses a wheelchair and does not require special transportation, but she does not wish to come to the office. When asked how she sees her other doctors, she reports she does not go to any other doctors' offices.  Pt reports she wishes to d/c Carelink monitoring. Advised that if she d/c's monitoring, we will not know if she has arrhythmia episodes. Pt states "that's okay." Asked if [name] was aware of her wishes and she says he is. [name] is pt's HCPOA per notes. Advised I will route this information to Dr. Lovena Le for review and recommendations. Pt is agreeable to this plan.

## 2019-02-08 NOTE — Telephone Encounter (Signed)
If she is competent then dc care link. GT

## 2019-02-09 DIAGNOSIS — E039 Hypothyroidism, unspecified: Secondary | ICD-10-CM | POA: Diagnosis not present

## 2019-02-09 DIAGNOSIS — F39 Unspecified mood [affective] disorder: Secondary | ICD-10-CM | POA: Diagnosis not present

## 2019-02-09 DIAGNOSIS — I119 Hypertensive heart disease without heart failure: Secondary | ICD-10-CM | POA: Diagnosis not present

## 2019-02-09 DIAGNOSIS — I69351 Hemiplegia and hemiparesis following cerebral infarction affecting right dominant side: Secondary | ICD-10-CM | POA: Diagnosis not present

## 2019-02-09 DIAGNOSIS — G4709 Other insomnia: Secondary | ICD-10-CM | POA: Diagnosis not present

## 2019-02-09 DIAGNOSIS — E785 Hyperlipidemia, unspecified: Secondary | ICD-10-CM | POA: Diagnosis not present

## 2019-02-13 DIAGNOSIS — F39 Unspecified mood [affective] disorder: Secondary | ICD-10-CM | POA: Diagnosis not present

## 2019-02-13 DIAGNOSIS — I69351 Hemiplegia and hemiparesis following cerebral infarction affecting right dominant side: Secondary | ICD-10-CM | POA: Diagnosis not present

## 2019-02-13 DIAGNOSIS — I119 Hypertensive heart disease without heart failure: Secondary | ICD-10-CM | POA: Diagnosis not present

## 2019-02-13 DIAGNOSIS — E785 Hyperlipidemia, unspecified: Secondary | ICD-10-CM | POA: Diagnosis not present

## 2019-02-13 DIAGNOSIS — G4709 Other insomnia: Secondary | ICD-10-CM | POA: Diagnosis not present

## 2019-02-13 DIAGNOSIS — E039 Hypothyroidism, unspecified: Secondary | ICD-10-CM | POA: Diagnosis not present

## 2019-02-21 ENCOUNTER — Other Ambulatory Visit: Payer: Self-pay | Admitting: *Deleted

## 2019-02-21 DIAGNOSIS — G4709 Other insomnia: Secondary | ICD-10-CM | POA: Diagnosis not present

## 2019-02-21 DIAGNOSIS — F39 Unspecified mood [affective] disorder: Secondary | ICD-10-CM | POA: Diagnosis not present

## 2019-02-21 DIAGNOSIS — I69351 Hemiplegia and hemiparesis following cerebral infarction affecting right dominant side: Secondary | ICD-10-CM | POA: Diagnosis not present

## 2019-02-21 DIAGNOSIS — E785 Hyperlipidemia, unspecified: Secondary | ICD-10-CM | POA: Diagnosis not present

## 2019-02-21 DIAGNOSIS — I119 Hypertensive heart disease without heart failure: Secondary | ICD-10-CM | POA: Diagnosis not present

## 2019-02-21 DIAGNOSIS — E039 Hypothyroidism, unspecified: Secondary | ICD-10-CM | POA: Diagnosis not present

## 2019-02-21 MED ORDER — LEVOTHYROXINE SODIUM 88 MCG PO TABS: 88.0000 ug | ORAL_TABLET | Freq: Every day | ORAL | 1 refills | Status: DC

## 2019-02-25 DIAGNOSIS — E039 Hypothyroidism, unspecified: Secondary | ICD-10-CM | POA: Diagnosis not present

## 2019-02-25 DIAGNOSIS — Z96652 Presence of left artificial knee joint: Secondary | ICD-10-CM | POA: Diagnosis not present

## 2019-02-25 DIAGNOSIS — L309 Dermatitis, unspecified: Secondary | ICD-10-CM | POA: Diagnosis not present

## 2019-02-25 DIAGNOSIS — I69351 Hemiplegia and hemiparesis following cerebral infarction affecting right dominant side: Secondary | ICD-10-CM | POA: Diagnosis not present

## 2019-02-25 DIAGNOSIS — Z87891 Personal history of nicotine dependence: Secondary | ICD-10-CM | POA: Diagnosis not present

## 2019-02-25 DIAGNOSIS — F39 Unspecified mood [affective] disorder: Secondary | ICD-10-CM | POA: Diagnosis not present

## 2019-02-25 DIAGNOSIS — G4709 Other insomnia: Secondary | ICD-10-CM | POA: Diagnosis not present

## 2019-02-25 DIAGNOSIS — Z9181 History of falling: Secondary | ICD-10-CM | POA: Diagnosis not present

## 2019-02-25 DIAGNOSIS — Z602 Problems related to living alone: Secondary | ICD-10-CM | POA: Diagnosis not present

## 2019-02-25 DIAGNOSIS — Z7982 Long term (current) use of aspirin: Secondary | ICD-10-CM | POA: Diagnosis not present

## 2019-02-25 DIAGNOSIS — E785 Hyperlipidemia, unspecified: Secondary | ICD-10-CM | POA: Diagnosis not present

## 2019-02-25 DIAGNOSIS — Z95818 Presence of other cardiac implants and grafts: Secondary | ICD-10-CM | POA: Diagnosis not present

## 2019-02-25 DIAGNOSIS — I119 Hypertensive heart disease without heart failure: Secondary | ICD-10-CM | POA: Diagnosis not present

## 2019-02-26 DIAGNOSIS — E785 Hyperlipidemia, unspecified: Secondary | ICD-10-CM | POA: Diagnosis not present

## 2019-02-26 DIAGNOSIS — I69351 Hemiplegia and hemiparesis following cerebral infarction affecting right dominant side: Secondary | ICD-10-CM | POA: Diagnosis not present

## 2019-02-26 DIAGNOSIS — E039 Hypothyroidism, unspecified: Secondary | ICD-10-CM | POA: Diagnosis not present

## 2019-02-26 DIAGNOSIS — G4709 Other insomnia: Secondary | ICD-10-CM | POA: Diagnosis not present

## 2019-02-26 DIAGNOSIS — I119 Hypertensive heart disease without heart failure: Secondary | ICD-10-CM | POA: Diagnosis not present

## 2019-02-26 DIAGNOSIS — F39 Unspecified mood [affective] disorder: Secondary | ICD-10-CM | POA: Diagnosis not present

## 2019-02-27 ENCOUNTER — Other Ambulatory Visit: Payer: Self-pay

## 2019-02-27 ENCOUNTER — Ambulatory Visit (INDEPENDENT_AMBULATORY_CARE_PROVIDER_SITE_OTHER): Payer: Medicare Other | Admitting: *Deleted

## 2019-02-27 DIAGNOSIS — I6300 Cerebral infarction due to thrombosis of unspecified precerebral artery: Secondary | ICD-10-CM | POA: Diagnosis not present

## 2019-02-28 LAB — CUP PACEART REMOTE DEVICE CHECK
Date Time Interrogation Session: 20200512213932
Implantable Pulse Generator Implant Date: 20200203

## 2019-03-05 ENCOUNTER — Telehealth: Payer: Self-pay | Admitting: Cardiology

## 2019-03-05 DIAGNOSIS — E785 Hyperlipidemia, unspecified: Secondary | ICD-10-CM | POA: Diagnosis not present

## 2019-03-05 DIAGNOSIS — E039 Hypothyroidism, unspecified: Secondary | ICD-10-CM | POA: Diagnosis not present

## 2019-03-05 DIAGNOSIS — I119 Hypertensive heart disease without heart failure: Secondary | ICD-10-CM | POA: Diagnosis not present

## 2019-03-05 DIAGNOSIS — F39 Unspecified mood [affective] disorder: Secondary | ICD-10-CM | POA: Diagnosis not present

## 2019-03-05 DIAGNOSIS — I69351 Hemiplegia and hemiparesis following cerebral infarction affecting right dominant side: Secondary | ICD-10-CM | POA: Diagnosis not present

## 2019-03-05 DIAGNOSIS — G4709 Other insomnia: Secondary | ICD-10-CM | POA: Diagnosis not present

## 2019-03-05 NOTE — Telephone Encounter (Signed)
LMOVM requesting a manual transmission w/ pt home monitor.

## 2019-03-05 NOTE — Telephone Encounter (Signed)
Per phone note from 02/08/19, patient wishes to discontinue Carelink monitoring but we were unable to confirm with her son (HCPOA). Plan to discuss with patient and son to confirm she still wants to d/c monitoring.

## 2019-03-06 NOTE — Telephone Encounter (Signed)
LMOVM for pt son to return call 

## 2019-03-07 DIAGNOSIS — F39 Unspecified mood [affective] disorder: Secondary | ICD-10-CM | POA: Diagnosis not present

## 2019-03-07 DIAGNOSIS — I119 Hypertensive heart disease without heart failure: Secondary | ICD-10-CM | POA: Diagnosis not present

## 2019-03-07 DIAGNOSIS — E785 Hyperlipidemia, unspecified: Secondary | ICD-10-CM | POA: Diagnosis not present

## 2019-03-07 DIAGNOSIS — I69351 Hemiplegia and hemiparesis following cerebral infarction affecting right dominant side: Secondary | ICD-10-CM | POA: Diagnosis not present

## 2019-03-07 DIAGNOSIS — G4709 Other insomnia: Secondary | ICD-10-CM | POA: Diagnosis not present

## 2019-03-07 DIAGNOSIS — E039 Hypothyroidism, unspecified: Secondary | ICD-10-CM | POA: Diagnosis not present

## 2019-03-07 NOTE — Telephone Encounter (Signed)
LMOVM for pt son to return call 

## 2019-03-13 ENCOUNTER — Ambulatory Visit: Payer: Medicare Other | Admitting: Podiatry

## 2019-03-13 DIAGNOSIS — G4709 Other insomnia: Secondary | ICD-10-CM | POA: Diagnosis not present

## 2019-03-13 DIAGNOSIS — E039 Hypothyroidism, unspecified: Secondary | ICD-10-CM | POA: Diagnosis not present

## 2019-03-13 DIAGNOSIS — I119 Hypertensive heart disease without heart failure: Secondary | ICD-10-CM | POA: Diagnosis not present

## 2019-03-13 DIAGNOSIS — E785 Hyperlipidemia, unspecified: Secondary | ICD-10-CM | POA: Diagnosis not present

## 2019-03-13 DIAGNOSIS — F39 Unspecified mood [affective] disorder: Secondary | ICD-10-CM | POA: Diagnosis not present

## 2019-03-13 DIAGNOSIS — I69351 Hemiplegia and hemiparesis following cerebral infarction affecting right dominant side: Secondary | ICD-10-CM | POA: Diagnosis not present

## 2019-03-13 NOTE — Telephone Encounter (Signed)
3rd attempt  LMOVM for pt son to return call.

## 2019-03-15 DIAGNOSIS — I119 Hypertensive heart disease without heart failure: Secondary | ICD-10-CM | POA: Diagnosis not present

## 2019-03-15 DIAGNOSIS — E785 Hyperlipidemia, unspecified: Secondary | ICD-10-CM | POA: Diagnosis not present

## 2019-03-15 DIAGNOSIS — F39 Unspecified mood [affective] disorder: Secondary | ICD-10-CM | POA: Diagnosis not present

## 2019-03-15 DIAGNOSIS — I69351 Hemiplegia and hemiparesis following cerebral infarction affecting right dominant side: Secondary | ICD-10-CM | POA: Diagnosis not present

## 2019-03-15 DIAGNOSIS — E039 Hypothyroidism, unspecified: Secondary | ICD-10-CM | POA: Diagnosis not present

## 2019-03-15 DIAGNOSIS — G4709 Other insomnia: Secondary | ICD-10-CM | POA: Diagnosis not present

## 2019-03-16 NOTE — Progress Notes (Signed)
Carelink Summary Report / Loop Recorder 

## 2019-03-16 NOTE — Telephone Encounter (Signed)
LMOM for son to return call.

## 2019-03-20 DIAGNOSIS — I119 Hypertensive heart disease without heart failure: Secondary | ICD-10-CM | POA: Diagnosis not present

## 2019-03-20 DIAGNOSIS — I69351 Hemiplegia and hemiparesis following cerebral infarction affecting right dominant side: Secondary | ICD-10-CM | POA: Diagnosis not present

## 2019-03-20 DIAGNOSIS — E785 Hyperlipidemia, unspecified: Secondary | ICD-10-CM | POA: Diagnosis not present

## 2019-03-20 DIAGNOSIS — G4709 Other insomnia: Secondary | ICD-10-CM | POA: Diagnosis not present

## 2019-03-20 DIAGNOSIS — E039 Hypothyroidism, unspecified: Secondary | ICD-10-CM | POA: Diagnosis not present

## 2019-03-20 DIAGNOSIS — F39 Unspecified mood [affective] disorder: Secondary | ICD-10-CM | POA: Diagnosis not present

## 2019-03-27 DIAGNOSIS — I69351 Hemiplegia and hemiparesis following cerebral infarction affecting right dominant side: Secondary | ICD-10-CM | POA: Diagnosis not present

## 2019-03-27 DIAGNOSIS — G4709 Other insomnia: Secondary | ICD-10-CM | POA: Diagnosis not present

## 2019-03-27 DIAGNOSIS — Z9181 History of falling: Secondary | ICD-10-CM | POA: Diagnosis not present

## 2019-03-27 DIAGNOSIS — L309 Dermatitis, unspecified: Secondary | ICD-10-CM | POA: Diagnosis not present

## 2019-03-27 DIAGNOSIS — Z96652 Presence of left artificial knee joint: Secondary | ICD-10-CM | POA: Diagnosis not present

## 2019-03-27 DIAGNOSIS — E039 Hypothyroidism, unspecified: Secondary | ICD-10-CM | POA: Diagnosis not present

## 2019-03-27 DIAGNOSIS — Z7982 Long term (current) use of aspirin: Secondary | ICD-10-CM | POA: Diagnosis not present

## 2019-03-27 DIAGNOSIS — E785 Hyperlipidemia, unspecified: Secondary | ICD-10-CM | POA: Diagnosis not present

## 2019-03-27 DIAGNOSIS — Z87891 Personal history of nicotine dependence: Secondary | ICD-10-CM | POA: Diagnosis not present

## 2019-03-27 DIAGNOSIS — I119 Hypertensive heart disease without heart failure: Secondary | ICD-10-CM | POA: Diagnosis not present

## 2019-03-27 DIAGNOSIS — Z95818 Presence of other cardiac implants and grafts: Secondary | ICD-10-CM | POA: Diagnosis not present

## 2019-03-27 DIAGNOSIS — F39 Unspecified mood [affective] disorder: Secondary | ICD-10-CM | POA: Diagnosis not present

## 2019-03-27 DIAGNOSIS — Z602 Problems related to living alone: Secondary | ICD-10-CM | POA: Diagnosis not present

## 2019-04-02 ENCOUNTER — Ambulatory Visit (INDEPENDENT_AMBULATORY_CARE_PROVIDER_SITE_OTHER): Payer: Medicare Other | Admitting: *Deleted

## 2019-04-02 DIAGNOSIS — L853 Xerosis cutis: Secondary | ICD-10-CM

## 2019-04-02 DIAGNOSIS — E785 Hyperlipidemia, unspecified: Secondary | ICD-10-CM | POA: Diagnosis not present

## 2019-04-02 DIAGNOSIS — I69351 Hemiplegia and hemiparesis following cerebral infarction affecting right dominant side: Secondary | ICD-10-CM | POA: Diagnosis not present

## 2019-04-02 DIAGNOSIS — G4709 Other insomnia: Secondary | ICD-10-CM | POA: Diagnosis not present

## 2019-04-02 DIAGNOSIS — E039 Hypothyroidism, unspecified: Secondary | ICD-10-CM | POA: Diagnosis not present

## 2019-04-02 DIAGNOSIS — I6300 Cerebral infarction due to thrombosis of unspecified precerebral artery: Secondary | ICD-10-CM

## 2019-04-02 DIAGNOSIS — I119 Hypertensive heart disease without heart failure: Secondary | ICD-10-CM | POA: Diagnosis not present

## 2019-04-02 DIAGNOSIS — F39 Unspecified mood [affective] disorder: Secondary | ICD-10-CM | POA: Diagnosis not present

## 2019-04-02 LAB — CUP PACEART REMOTE DEVICE CHECK
Date Time Interrogation Session: 20200614225258
Implantable Pulse Generator Implant Date: 20200203

## 2019-04-03 ENCOUNTER — Telehealth: Payer: Self-pay | Admitting: Family Medicine

## 2019-04-03 ENCOUNTER — Ambulatory Visit: Payer: Self-pay | Admitting: Family Medicine

## 2019-04-03 NOTE — Telephone Encounter (Signed)
Please have her increase her Amlodipine from 2.5 to 5 mg daily and we can discuss at her appt later this month

## 2019-04-03 NOTE — Telephone Encounter (Signed)
Patient called to discuss her BP, left VM to return call to the office to discuss.   I called Shreejsh from [place] and he says he was just wanting the provider to know about her BP 160/80 on yesterday. He says it's usually been running around SBP 156-158 and he figured he would call. He says she was fine, no other symptoms noted.   Summary: bp reading    Shreejsh from [place] called - he said he was visiting pt yesterday evening - it was 160/80. He did not call yesterday because it was after 5. Please call [phone]        Reason for Disposition . Health Information question, no triage required and triager able to answer question  Protocols used: INFORMATION ONLY CALL-A-AH

## 2019-04-03 NOTE — Telephone Encounter (Signed)
Will send to PCP- however she is out of office this week.

## 2019-04-04 DIAGNOSIS — E785 Hyperlipidemia, unspecified: Secondary | ICD-10-CM | POA: Diagnosis not present

## 2019-04-04 DIAGNOSIS — I69351 Hemiplegia and hemiparesis following cerebral infarction affecting right dominant side: Secondary | ICD-10-CM | POA: Diagnosis not present

## 2019-04-04 DIAGNOSIS — E039 Hypothyroidism, unspecified: Secondary | ICD-10-CM | POA: Diagnosis not present

## 2019-04-04 DIAGNOSIS — I119 Hypertensive heart disease without heart failure: Secondary | ICD-10-CM | POA: Diagnosis not present

## 2019-04-04 DIAGNOSIS — G4709 Other insomnia: Secondary | ICD-10-CM | POA: Diagnosis not present

## 2019-04-04 DIAGNOSIS — F39 Unspecified mood [affective] disorder: Secondary | ICD-10-CM | POA: Diagnosis not present

## 2019-04-04 NOTE — Telephone Encounter (Signed)
Spoke with patient, confirmed that she no longer wishes to have her ILR followed. Previously approved by Dr. Lovena Le (see phone note opened on 02/02/19). Pt understands that we will not know about any arrhythmias (including A-fib) if she is not monitored. She reports her son is aware of her decision and is in agreement. Pt verbalizes understanding of all information and requests a return kit for her home monitor. Unenrolled from [place], return kit ordered.

## 2019-04-05 MED ORDER — AMLODIPINE BESYLATE 5 MG PO TABS: 5.0000 mg | ORAL_TABLET | Freq: Every day | ORAL | 3 refills | Status: AC

## 2019-04-05 NOTE — Telephone Encounter (Signed)
Patient was not taking amplodipine at this time. I discussed with her that it was advised she take the medication. Medication was sent to pharmacy per patient

## 2019-04-05 NOTE — Addendum Note (Signed)
Addended by: Magdalene Molly A on: 04/05/2019 11:00 AM   Modules accepted: Orders

## 2019-04-11 DIAGNOSIS — E039 Hypothyroidism, unspecified: Secondary | ICD-10-CM | POA: Diagnosis not present

## 2019-04-11 DIAGNOSIS — E785 Hyperlipidemia, unspecified: Secondary | ICD-10-CM | POA: Diagnosis not present

## 2019-04-11 DIAGNOSIS — F39 Unspecified mood [affective] disorder: Secondary | ICD-10-CM | POA: Diagnosis not present

## 2019-04-11 DIAGNOSIS — I69351 Hemiplegia and hemiparesis following cerebral infarction affecting right dominant side: Secondary | ICD-10-CM | POA: Diagnosis not present

## 2019-04-11 DIAGNOSIS — G4709 Other insomnia: Secondary | ICD-10-CM | POA: Diagnosis not present

## 2019-04-11 DIAGNOSIS — I119 Hypertensive heart disease without heart failure: Secondary | ICD-10-CM | POA: Diagnosis not present

## 2019-04-12 NOTE — Progress Notes (Signed)
Carelink Summary Report / Loop Recorder 

## 2019-04-13 DIAGNOSIS — G4709 Other insomnia: Secondary | ICD-10-CM | POA: Diagnosis not present

## 2019-04-13 DIAGNOSIS — F39 Unspecified mood [affective] disorder: Secondary | ICD-10-CM | POA: Diagnosis not present

## 2019-04-13 DIAGNOSIS — I69351 Hemiplegia and hemiparesis following cerebral infarction affecting right dominant side: Secondary | ICD-10-CM | POA: Diagnosis not present

## 2019-04-13 DIAGNOSIS — I119 Hypertensive heart disease without heart failure: Secondary | ICD-10-CM | POA: Diagnosis not present

## 2019-04-13 DIAGNOSIS — E039 Hypothyroidism, unspecified: Secondary | ICD-10-CM | POA: Diagnosis not present

## 2019-04-13 DIAGNOSIS — E785 Hyperlipidemia, unspecified: Secondary | ICD-10-CM | POA: Diagnosis not present

## 2019-04-16 ENCOUNTER — Ambulatory Visit: Payer: Medicare Other | Admitting: Family Medicine

## 2019-04-16 ENCOUNTER — Other Ambulatory Visit: Payer: Self-pay

## 2019-04-17 DIAGNOSIS — E785 Hyperlipidemia, unspecified: Secondary | ICD-10-CM | POA: Diagnosis not present

## 2019-04-17 DIAGNOSIS — E039 Hypothyroidism, unspecified: Secondary | ICD-10-CM | POA: Diagnosis not present

## 2019-04-17 DIAGNOSIS — I69351 Hemiplegia and hemiparesis following cerebral infarction affecting right dominant side: Secondary | ICD-10-CM | POA: Diagnosis not present

## 2019-04-17 DIAGNOSIS — I119 Hypertensive heart disease without heart failure: Secondary | ICD-10-CM | POA: Diagnosis not present

## 2019-04-17 DIAGNOSIS — G4709 Other insomnia: Secondary | ICD-10-CM | POA: Diagnosis not present

## 2019-04-17 DIAGNOSIS — F39 Unspecified mood [affective] disorder: Secondary | ICD-10-CM | POA: Diagnosis not present

## 2019-04-23 DIAGNOSIS — G4709 Other insomnia: Secondary | ICD-10-CM | POA: Diagnosis not present

## 2019-04-23 DIAGNOSIS — E785 Hyperlipidemia, unspecified: Secondary | ICD-10-CM | POA: Diagnosis not present

## 2019-04-23 DIAGNOSIS — F39 Unspecified mood [affective] disorder: Secondary | ICD-10-CM | POA: Diagnosis not present

## 2019-04-23 DIAGNOSIS — E039 Hypothyroidism, unspecified: Secondary | ICD-10-CM | POA: Diagnosis not present

## 2019-04-23 DIAGNOSIS — I119 Hypertensive heart disease without heart failure: Secondary | ICD-10-CM | POA: Diagnosis not present

## 2019-04-23 DIAGNOSIS — I69351 Hemiplegia and hemiparesis following cerebral infarction affecting right dominant side: Secondary | ICD-10-CM | POA: Diagnosis not present

## 2019-04-25 DIAGNOSIS — F319 Bipolar disorder, unspecified: Secondary | ICD-10-CM | POA: Diagnosis not present

## 2019-04-25 DIAGNOSIS — Z8673 Personal history of transient ischemic attack (TIA), and cerebral infarction without residual deficits: Secondary | ICD-10-CM | POA: Diagnosis not present

## 2019-04-27 ENCOUNTER — Telehealth: Payer: Self-pay

## 2019-04-27 NOTE — Telephone Encounter (Signed)
Called patient to schedule prolia injection. She will owe $0. Patient however refused to schedule as she has not left the house due to covid and she does not want to leave any time soon. Advised her to schedule nv when she is ready.

## 2019-05-17 ENCOUNTER — Telehealth: Payer: Self-pay

## 2019-05-17 ENCOUNTER — Other Ambulatory Visit: Payer: Self-pay

## 2019-05-17 DIAGNOSIS — Z23 Encounter for immunization: Secondary | ICD-10-CM

## 2019-05-17 NOTE — Telephone Encounter (Signed)
Copied from [place] [phone] [phone]. Topic: General - Other >> May 16, 2019  2:29 PM Antonieta Iba C wrote: Reason for CRM: pt's son [name] called in to be advised. Pt is going to a nursing home and need to have FL2 forms completed, TB test and assistance with a DNR form completion. Son would like assistance  CB: [phone] -     I have the form completed as much as possible forwarded over to PCP to complete   Called son [name] back to have mom placed on the schedule for a TB nurse visit and possible a TD vaccine,.

## 2019-05-18 ENCOUNTER — Telehealth: Payer: Self-pay | Admitting: Family Medicine

## 2019-05-18 NOTE — Telephone Encounter (Signed)
Nursing home called in to state they are looking for this paper work asap. Please advise.

## 2019-05-18 NOTE — Telephone Encounter (Signed)
Spoke with patient about moms FL2, let him know it will be completed by Monday and after patient has had her TB done

## 2019-05-21 NOTE — Telephone Encounter (Addendum)
Apolonio Schneiders w/[employer] [phone] would like a return call concerning the patient's FL2 form.  She can be reached after 1pm.

## 2019-05-22 ENCOUNTER — Ambulatory Visit: Payer: Medicare Other

## 2019-05-22 ENCOUNTER — Encounter: Payer: Self-pay | Admitting: Family Medicine

## 2019-05-22 ENCOUNTER — Ambulatory Visit (INDEPENDENT_AMBULATORY_CARE_PROVIDER_SITE_OTHER): Payer: Medicare Other | Admitting: Family Medicine

## 2019-05-22 ENCOUNTER — Other Ambulatory Visit: Payer: Self-pay

## 2019-05-22 VITALS — BP 125/73 | HR 93 | Temp 98.5°F | Resp 18

## 2019-05-22 DIAGNOSIS — I10 Essential (primary) hypertension: Secondary | ICD-10-CM

## 2019-05-22 DIAGNOSIS — I6300 Cerebral infarction due to thrombosis of unspecified precerebral artery: Secondary | ICD-10-CM | POA: Diagnosis not present

## 2019-05-22 DIAGNOSIS — L853 Xerosis cutis: Secondary | ICD-10-CM | POA: Insufficient documentation

## 2019-05-22 DIAGNOSIS — E119 Type 2 diabetes mellitus without complications: Secondary | ICD-10-CM

## 2019-05-22 DIAGNOSIS — Z66 Do not resuscitate: Secondary | ICD-10-CM | POA: Diagnosis not present

## 2019-05-22 DIAGNOSIS — M810 Age-related osteoporosis without current pathological fracture: Secondary | ICD-10-CM

## 2019-05-22 DIAGNOSIS — E039 Hypothyroidism, unspecified: Secondary | ICD-10-CM | POA: Diagnosis not present

## 2019-05-22 DIAGNOSIS — E1065 Type 1 diabetes mellitus with hyperglycemia: Secondary | ICD-10-CM | POA: Diagnosis not present

## 2019-05-22 DIAGNOSIS — Z111 Encounter for screening for respiratory tuberculosis: Secondary | ICD-10-CM

## 2019-05-22 DIAGNOSIS — R739 Hyperglycemia, unspecified: Secondary | ICD-10-CM

## 2019-05-22 DIAGNOSIS — R609 Edema, unspecified: Secondary | ICD-10-CM | POA: Diagnosis not present

## 2019-05-22 DIAGNOSIS — E785 Hyperlipidemia, unspecified: Secondary | ICD-10-CM

## 2019-05-22 MED ORDER — TETANUS-DIPHTHERIA TOXOIDS TD 5-2 LFU IM INJ: 0.5000 mL | INJECTION | Freq: Once | INTRAMUSCULAR | 0 refills | Status: AC

## 2019-05-22 MED ORDER — TRIAMCINOLONE ACETONIDE 0.1 % EX CREA: 1.0000 "application " | TOPICAL_CREAM | Freq: Every day | CUTANEOUS | 3 refills | Status: DC | PRN

## 2019-05-22 NOTE — Telephone Encounter (Signed)
Called [name] [name] back left a voicemail for her to return my call. Left a detailed message for [name] [name] letting her know the [facility] form is completed we are waiting on patient to come in to do her TB test.

## 2019-05-22 NOTE — Assessment & Plan Note (Signed)
Long history. Try Eucerin compounded with Triamcinolone cream 0.1% applied in thin layer to skin daily as needed

## 2019-05-22 NOTE — Assessment & Plan Note (Signed)
DNR forms completed 05/22/2019 in office with her son present. MOST form completed 05/22/2019, no cpr but willing to accept limited use of IV antibiotics and fluids but no feeding tubes. Does not want heroic measures.

## 2019-05-22 NOTE — Patient Instructions (Signed)
Eating Plan After Stroke A stroke causes damage to the brain cells, which can affect your ability to walk, talk, and even eat. The impact of a stroke is different for everyone, and so is recovery. A good nutrition plan is important for your recovery. It can also lower your risk of another stroke. If you have difficulty chewing and swallowing your food, a dietitian or your stroke care team can help so that you can enjoy eating healthy foods. What are tips for following this plan?  Reading food labels  Choose foods that have less than 300 milligrams (mg) of sodium per serving. Limit your sodium intake to less than 1,500 mg per day.  Avoid foods that have saturated fat and trans fat.  Choose foods that are low in cholesterol. Limit the amount of cholesterol you eat each day to less than 200 mg.  Choose foods that are high in fiber. Eat 20-30 grams (g) of fiber each day.  Avoid foods with added sugar. Check the food label for ingredients such as sugar, corn syrup, honey, fructose, molasses, and cane juice. Shopping  At the grocery store, buy most of your food from areas near the walls of the store. This includes: ? Fresh fruits and vegetables. ? Dry grains, beans, nuts, and seeds. ? Fresh seafood, poultry, lean meats, and eggs. ? Low-fat dairy products.  Buy whole ingredients instead of prepackaged foods.  Buy fresh, in-season fruits and vegetables from local farmers markets.  Buy frozen fruits and vegetables in resealable bags. Cooking  Prepare foods with very little salt. Use herbs or salt-free spices instead.  Cook with heart-healthy oils, such as olive, avocado, canola, soybean, or sunflower oil.  Avoid frying foods. Bake, grill, or broil foods instead.  Remove visible fat and skin from meat and poultry before eating.  Modify food textures as told by your health care provider. Meal planning  Eat a wide variety of colorful fruits and vegetables. Make sure one-half of your  plate is filled with fruits and vegetables at each meal.  Eat fruits and vegetables that are high in potassium, such as: ? Apples, bananas, oranges, and melon. ? Sweet potatoes, spinach, zucchini, and tomatoes.  Eat fish that contain heart-healthy fats (omega-3 fats) at least twice a week. These include salmon, tuna, mackerel, and sardines.  Eat plant foods that are high in omega-3 fats, such as flaxseeds and walnuts. Add these to cereals, yogurt, or pasta dishes.  Eat several servings of high-fiber foods each day, such as fruits, vegetables, whole grains, and beans.  Do not put salt at the table for meals.  When eating out at restaurants: ? Ask the server about low-salt or salt-free food options. ? Avoid fried foods. Look for menu items that are grilled, steamed, broiled, or roasted. ? Ask if your food can be prepared without butter. ? Ask for condiments, such as salad dressings, gravy, or sauces to be served on the side.  If you have difficulty swallowing: ? Choose foods that are softer and easier to chew and swallow. ? Cut foods into small pieces and chew well before swallowing. ? Thicken liquids as told by your health care provider or dietitian. ? Let your health care provider know if your condition does not improve over time. You may need to work with a speech therapist to re-train the muscles that are used for eating. General recommendations  Involve your family and friends in your recovery, if possible. It may be helpful to have a slower meal time   and to plan meals that include foods everyone in the family can eat.  Brush your teeth with fluoride toothpaste twice a day, and floss once a day. Keeping a clean mouth can help you swallow and can also help your appetite.  Drink enough water each day to keep your urine pale yellow. If needed, set reminders or ask your family to help you remember to drink water.  Limit alcohol intake to no more than 1 drink a day for nonpregnant  women and 2 drinks a day for men. One drink equals 12 oz of beer, 5 oz of wine, or 1 oz of hard liquor. Summary  Following this eating plan can help in your stroke recovery and can decrease your risk for another stroke.  Let your health care provider know if you have problems with swallowing. You may need to work with a speech therapist. This information is not intended to replace advice given to you by your health care provider. Make sure you discuss any questions you have with your health care provider. Document Released: 12/12/2017 Document Revised: 01/25/2019 Document Reviewed: 12/12/2017 Elsevier Patient Education  2020 [employer].

## 2019-05-22 NOTE — Assessment & Plan Note (Signed)
Well controlled, no changes to meds. Encouraged heart healthy diet such as the DASH diet and exercise as tolerated.  °

## 2019-05-22 NOTE — Assessment & Plan Note (Signed)
On Levothyroxine, continue to monitor 

## 2019-05-22 NOTE — Assessment & Plan Note (Addendum)
MRI 11/18/18: Acute lacunar infarct of the posterior Left corona radiata and posterior lentiform, with two small superimposed small acute white matter infarcts in the posterior Left MCA territory, including near the left motor strip. No associated hemorrhage or mass effect.  2. Intracranial MRA is negative for large vessel occlusion but positive for moderate stenoses of the both the distal Left A2 and Left anterior M2 branches.  3. Underlying moderate chronic small vessel disease.  She is here today accompanied by her son and while she has regained a great deal of strength she needs significant assistance with dressing, bathing and food prep so she is being placed at [facility] and forms are filled out today

## 2019-05-22 NOTE — Assessment & Plan Note (Signed)
Mild, ordered NAS diet for [facility]

## 2019-05-22 NOTE — Addendum Note (Signed)
Addended by: Magdalene Molly A on: 05/22/2019 01:44 PM   Modules accepted: Orders

## 2019-05-22 NOTE — Assessment & Plan Note (Signed)
Long history, given rx for Eucerin Cream compounded with triamcinolone cream 0.1 % to apply in thin layer to skin prn.

## 2019-05-22 NOTE — Progress Notes (Addendum)
Subjective:    Patient ID: [name] [name], female    DOB: [birth year], 85 y.o.   MRN: [id number]  No chief complaint on file.   HPI Patient is in today for evaluation prior to having her placed at [facility] for skilled nursing. In February she had a left sided stroke resulting in right sided weakness which has improved some. Unfortunately she still needs significant help with bathing, dressing and food prep. They report she is eating well no trouble with swallowing or speech. No recent febrile illness or hospitalizations.. Denies CP/palp/SOB/HA/congestion/fevers/GI or GU c/o. Taking meds as prescribed  Past Medical History:  Diagnosis Date  . Anemia    h/o fibroids  . Bipolar disorder ([place])   . Depression    bipolar  . Eczema   . H/O stroke within last year 11/19/2018  . HTN (hypertension)   . Hypothyroidism   . Insomnia   . Osteoarthritis   . Osteoporosis   . Psoriasis     Past Surgical History:  Procedure Laterality Date  . ABDOMINAL HYSTERECTOMY  1982  . APPENDECTOMY  2003  . BREAST LUMPECTOMY  1947   left  . CATARACT EXTRACTION  2010  . EYE SURGERY    . HERNIA REPAIR    . KNEE ARTHROSCOPY  09/07/11   right knee  . LOOP RECORDER INSERTION N/A 11/20/2018   Procedure: LOOP RECORDER INSERTION;  Surgeon: Evans Lance, MD;  Location: [facility];  Service: Cardiovascular;  Laterality: N/A;  . LOOP RECORDER INSERTION  11/20/2018   Procedure: LOOP RECORDER INSERTION;  Surgeon: Jerline Pain, MD;  Location: [place];  Service: Cardiovascular;;  . NASAL SEPTUM SURGERY  1970's  . TEE WITHOUT CARDIOVERSION N/A 11/20/2018   Procedure: TRANSESOPHAGEAL ECHOCARDIOGRAM (TEE);  Surgeon: Jerline Pain, MD;  Location: [facility] ENDOSCOPY;  Service: Cardiovascular;  Laterality: N/A;  . TONSILLECTOMY  1970's  . TONSILLECTOMY    . TOTAL KNEE ARTHROPLASTY Right 03/04/2015   Procedure: RIGHT TOTAL KNEE ARTHROPLASTY;  Surgeon: Mcarthur Rossetti, MD;  Location: [facility];  Service:  Orthopedics;  Laterality: Right;  . VENTRAL HERNIA REPAIR  2005    Family History  Problem Relation Age of Onset  . Colon cancer Brother   . Lung cancer Father   . Prostate cancer Father   . Arthritis Father   . Cancer Father        lung, prostate  . Heart disease Brother   . Cancer Brother        lung  . Lung cancer Brother   . Aortic aneurysm Brother   . Cancer Other        colon    Social History   Socioeconomic History  . Marital status: Single    Spouse name: Not on file  . Number of children: 1  . Years of education: Not on file  . Highest education level: Not on file  Occupational History  . Occupation: retired    Fish farm manager: RETIRED  Social Needs  . Financial resource strain: Not on file  . Food insecurity    Worry: Not on file    Inability: Not on file  . Transportation needs    Medical: Not on file    Non-medical: Not on file  Tobacco Use  . Smoking status: Former Smoker    Quit date: 09/29/1985    Years since quitting: 33.6  . Smokeless tobacco: Never Used  Substance and Sexual Activity  . Alcohol use: No  . Drug  use: No  . Sexual activity: Not Currently    Partners: Male  Lifestyle  . Physical activity    Days per week: Not on file    Minutes per session: Not on file  . Stress: Not on file  Relationships  . Social Herbalist on phone: Not on file    Gets together: Not on file    Attends religious service: Not on file    Active member of club or organization: Not on file    Attends meetings of clubs or organizations: Not on file    Relationship status: Not on file  . Intimate partner violence    Fear of current or ex partner: Not on file    Emotionally abused: Not on file    Physically abused: Not on file    Forced sexual activity: Not on file  Other Topics Concern  . Not on file  Social History Narrative   Lives by herself   PT for knee----silver sneakers    Outpatient Medications Prior to Visit  Medication Sig Dispense  Refill  . amLODipine (NORVASC) 5 MG tablet Take 1 tablet (5 mg total) by mouth daily. 90 tablet 3  . ARIPiprazole (ABILIFY) 5 MG tablet Take 1 tablet (5 mg total) by mouth daily. 3 tablet 0  . aspirin EC 81 MG EC tablet Take 1 tablet (81 mg total) by mouth daily. 30 tablet 0  . atorvastatin (LIPITOR) 40 MG tablet Take 1 tablet (40 mg total) by mouth daily at 6 PM. 30 tablet 0  . cholecalciferol (VITAMIN D) 1000 UNITS tablet Take 3,000 Units by mouth daily.     . clopidogrel (PLAVIX) 75 MG tablet Take 1 tablet (75 mg total) by mouth daily. 30 tablet 0  . furosemide (LASIX) 20 MG tablet Take 1 tablet (20 mg total) by mouth daily. 30 tablet   . levothyroxine (SYNTHROID) 88 MCG tablet Take 1 tablet (88 mcg total) by mouth daily. 90 tablet 1  . Melatonin 5 MG TABS Take 3 tablets (15 mg total) by mouth at bedtime. 3 tablet 0  . multivitamin (PROSIGHT) TABS tablet Take 1 tablet by mouth daily. 30 each 0  . potassium chloride SA (K-DUR,KLOR-CON) 20 MEQ tablet Take 1 tablet (20 mEq total) by mouth daily.    Marland Kitchen senna-docusate (SENOKOT-S) 8.6-50 MG tablet Take 2 tablets by mouth 2 (two) times daily.    Marland Kitchen tetanus & diphtheria toxoids, adult, (TENIVAC) 5-2 LFU injection Inject 0.5 mLs into the muscle once for 1 dose. 0.5 mL 0   No facility-administered medications prior to visit.     Allergies  Allergen Reactions  . Bupropion Hcl Rash    Review of Systems  Constitutional: Negative for chills, fever and malaise/fatigue.  HENT: Negative for congestion and hearing loss.   Eyes: Negative for discharge.  Respiratory: Negative for cough, sputum production and shortness of breath.   Cardiovascular: Negative for chest pain, palpitations and leg swelling.  Gastrointestinal: Negative for abdominal pain, blood in stool, constipation, diarrhea, heartburn, nausea and vomiting.  Genitourinary: Negative for dysuria, frequency, hematuria and urgency.  Musculoskeletal: Negative for back pain, falls and myalgias.   Skin: Negative for rash.  Neurological: Positive for focal weakness. Negative for dizziness, sensory change, loss of consciousness, weakness and headaches.  Endo/Heme/Allergies: Negative for environmental allergies. Does not bruise/bleed easily.  Psychiatric/Behavioral: Negative for depression and suicidal ideas. The patient is not nervous/anxious and does not have insomnia.        Objective:  Physical Exam Vitals signs and nursing note reviewed.  Constitutional:      General: She is not in acute distress.    Appearance: She is well-developed.  HENT:     Head: Normocephalic and atraumatic.     Nose: Nose normal.  Eyes:     General:        Right eye: No discharge.        Left eye: No discharge.  Neck:     Musculoskeletal: Normal range of motion and neck supple.  Cardiovascular:     Rate and Rhythm: Normal rate and regular rhythm.     Heart sounds: No murmur.  Pulmonary:     Effort: Pulmonary effort is normal.     Breath sounds: Normal breath sounds.  Abdominal:     General: Bowel sounds are normal.     Palpations: Abdomen is soft.     Tenderness: There is no abdominal tenderness.  Musculoskeletal:     Right lower leg: Edema present.     Left lower leg: Edema present.     Comments: Trace edema b/l  Skin:    General: Skin is warm and dry.  Neurological:     Mental Status: She is alert and oriented to person, place, and time.     Cranial Nerves: No cranial nerve deficit.     Motor: Weakness present.     Deep Tendon Reflexes: Reflexes normal.     Comments: 5/5 strength right side 4/5 strength left side arm weaker>leg     BP 125/73 (BP Location: Left Arm, Patient Position: Sitting, Cuff Size: Normal)   Pulse 93   Temp 98.5 F (36.9 C) (Oral)   Resp 18   SpO2 98%  Wt Readings from Last 3 Encounters:  01/11/19 146 lb (66.2 kg)  11/22/18 150 lb 6.1 oz (68.2 kg)  11/20/18 142 lb (64.4 kg)    Diabetic Foot Exam - Simple   No data filed     Lab Results   Component Value Date   WBC 6.8 11/21/2018   HGB 13.3 11/21/2018   HCT 40.9 11/21/2018   PLT 215 11/21/2018   GLUCOSE 151 (H) 11/27/2018   CHOL 191 11/19/2018   TRIG 57 11/19/2018   HDL 54 11/19/2018   LDLDIRECT 150.7 09/06/2013   LDLCALC 126 (H) 11/19/2018   ALT 18 11/21/2018   AST 24 11/21/2018   NA 140 11/27/2018   K 3.7 11/27/2018   CL 108 11/27/2018   CREATININE 0.95 12/04/2018   BUN 20 11/27/2018   CO2 22 11/27/2018   TSH 5.18 05/22/2018   TSH 5.18 05/22/2018   INR 0.90 11/18/2018   HGBA1C 5.2 11/19/2018    Lab Results  Component Value Date   TSH 5.18 05/22/2018   TSH 5.18 05/22/2018   Lab Results  Component Value Date   WBC 6.8 11/21/2018   HGB 13.3 11/21/2018   HCT 40.9 11/21/2018   MCV 97.4 11/21/2018   PLT 215 11/21/2018   Lab Results  Component Value Date   NA 140 11/27/2018   K 3.7 11/27/2018   CO2 22 11/27/2018   GLUCOSE 151 (H) 11/27/2018   BUN 20 11/27/2018   CREATININE 0.95 12/04/2018   BILITOT 0.6 11/21/2018   ALKPHOS 21 (L) 11/21/2018   AST 24 11/21/2018   ALT 18 11/21/2018   PROT 6.5 11/21/2018   ALBUMIN 3.5 11/21/2018   CALCIUM 9.1 11/27/2018   ANIONGAP 10 11/27/2018   GFR 45.16 (L) 08/04/2018   Lab Results  Component Value Date   CHOL 191 11/19/2018   Lab Results  Component Value Date   HDL 54 11/19/2018   Lab Results  Component Value Date   LDLCALC 126 (H) 11/19/2018   Lab Results  Component Value Date   TRIG 57 11/19/2018   Lab Results  Component Value Date   CHOLHDL 3.5 11/19/2018   Lab Results  Component Value Date   HGBA1C 5.2 11/19/2018       Assessment & Plan:   Problem List Items Addressed This Visit    Hypothyroidism    On Levothyroxine, continue to monitor      Osteoporosis   Relevant Orders   VITAMIN D 25 Hydroxy (Vit-D Deficiency, Fractures)   Stroke ([place])    MRI 11/18/18: Acute lacunar infarct of the posterior Left corona radiata and posterior lentiform, with two small superimposed small  acute white matter infarcts in the posterior Left MCA territory, including near the left motor strip. No associated hemorrhage or mass effect.  2. Intracranial MRA is negative for large vessel occlusion but positive for moderate stenoses of the both the distal Left A2 and Left anterior M2 branches.  3. Underlying moderate chronic small vessel disease.  She is here today accompanied by her son and while she has regained a great deal of strength she needs significant assistance with dressing, bathing and food prep so she is being placed at [facility] and forms are filled out today      HTN (hypertension)    Well controlled, no changes to meds. Encouraged heart healthy diet such as the DASH diet and exercise as tolerated.       Peripheral edema    Mild, ordered NAS diet for [facility]      Do not resuscitate    DNR forms completed 05/22/2019 in office with her son present. MOST form completed 05/22/2019, no cpr but willing to accept limited use of IV antibiotics and fluids but no feeding tubes. Does not want heroic measures.      Dry skin dermatitis    Long history, given rx for Eucerin Cream compounded with triamcinolone cream 0.1 % to apply in thin layer to skin prn.       Other Visit Diagnoses    Screening-pulmonary TB    -  Primary   Relevant Orders   QuantiFERON-TB Gold Plus   Type 1 diabetes mellitus with hyperglycemia (HCC)       Hyperglycemia       Hyperlipidemia, unspecified hyperlipidemia type       Relevant Orders   Lipid panel   Diabetes mellitus without complication ([place])       Relevant Orders   CBC   Comprehensive metabolic panel   TSH      I am having [name] [name] start on triamcinolone cream. I am also having her maintain her cholecalciferol, aspirin, atorvastatin, clopidogrel, ARIPiprazole, furosemide, Melatonin, multivitamin, potassium chloride SA, senna-docusate, levothyroxine, amLODipine, and tetanus & diphtheria toxoids (adult).   Meds ordered this encounter  Medications  . triamcinolone cream (KENALOG) 0.1 %    Sig: Apply 1 application topically daily as needed.    Dispense:  30 g    Refill:  3     Penni Homans, MD

## 2019-05-23 ENCOUNTER — Telehealth: Payer: Self-pay

## 2019-05-23 LAB — CBC
HCT: 42.5 % (ref 36.0–46.0)
Hemoglobin: 14.2 g/dL (ref 12.0–15.0)
MCHC: 33.3 g/dL (ref 30.0–36.0)
MCV: 96.7 fl (ref 78.0–100.0)
Platelets: 304 10*3/uL (ref 150.0–400.0)
RBC: 4.4 Mil/uL (ref 3.87–5.11)
RDW: 15.1 % (ref 11.5–15.5)
WBC: 7.5 10*3/uL (ref 4.0–10.5)

## 2019-05-23 LAB — LIPID PANEL
Cholesterol: 228 mg/dL — ABNORMAL HIGH (ref 0–200)
HDL: 56.1 mg/dL (ref >39.00)
LDL Cholesterol: 145 mg/dL — ABNORMAL HIGH (ref 0–99)
NonHDL: 171.95
Total CHOL/HDL Ratio: 4
Triglycerides: 134 mg/dL (ref 0.0–149.0)
VLDL: 26.8 mg/dL (ref 0.0–40.0)

## 2019-05-23 LAB — COMPREHENSIVE METABOLIC PANEL
ALT: 10 U/L (ref 0–35)
AST: 13 U/L (ref 0–37)
Albumin: 4.1 g/dL (ref 3.5–5.2)
Alkaline Phosphatase: 30 U/L — ABNORMAL LOW (ref 39–117)
BUN: 18 mg/dL (ref 6–23)
CO2: 28 mEq/L (ref 19–32)
Calcium: 9.9 mg/dL (ref 8.4–10.5)
Chloride: 103 mEq/L (ref 96–112)
Creatinine, Ser: 1.14 mg/dL (ref 0.40–1.20)
GFR: 45.42 mL/min — ABNORMAL LOW (ref >60.00)
Glucose, Bld: 76 mg/dL (ref 70–99)
Potassium: 3.7 mEq/L (ref 3.5–5.1)
Sodium: 141 mEq/L (ref 135–145)
Total Bilirubin: 0.6 mg/dL (ref 0.2–1.2)
Total Protein: 7.1 g/dL (ref 6.0–8.3)

## 2019-05-23 LAB — TSH: TSH: 2.32 u[IU]/mL (ref 0.35–4.50)

## 2019-05-23 LAB — VITAMIN D 25 HYDROXY (VIT D DEFICIENCY, FRACTURES): VITD: 55.31 ng/mL (ref 30.00–100.00)

## 2019-05-23 NOTE — Telephone Encounter (Signed)
Copied from [place] [phone]. Topic: General - Other >> May 22, 2019  3:27 PM Wynetta Emery, [place] C wrote: Reason for CRM: pt was seen today. Pt's son is calling in about the Rx that was sent in to the pharmacy. Son says that he was told by pharmacy that they need clarity on Rx for triamcinolone cream (KENALOG) 0.1 %   Please call pharmacy to advise.   CB: [phone] - Fredirick Lathe

## 2019-05-23 NOTE — Telephone Encounter (Signed)
She needs the Eucerin and Triamcinolone cream compounded.a one to one ratio. Please clarify with pharmacy

## 2019-05-24 LAB — QUANTIFERON-TB GOLD PLUS
Mitogen-NIL: 8.87 IU/mL
NIL: 0.06 IU/mL
QuantiFERON-TB Gold Plus: NEGATIVE
TB1-NIL: 0.01 IU/mL
TB2-NIL: 0.02 IU/mL

## 2019-05-25 MED ORDER — TRIAMCINOLONE 0.1 % CREAM:EUCERIN CREAM 1:1: 1.0000 "application " | TOPICAL_CREAM | Freq: Every day | CUTANEOUS | 3 refills | Status: DC | PRN

## 2019-05-25 NOTE — Telephone Encounter (Signed)
Sent medications back in to the pharmacy

## 2019-05-31 NOTE — Telephone Encounter (Addendum)
Caller name: Apolonio Schneiders or Danton Clap Relation to pt: [facility]  Call back number: [phone] and fax # [phone]    Reason for call:  addendum FL2 form was fax on Friday 05/25/2019, re faxing today to [phone] Attention Princess and time is sensitive would like form back today due to patient moving in soon

## 2019-06-08 NOTE — Telephone Encounter (Signed)
Resent medication in

## 2019-07-03 ENCOUNTER — Telehealth: Payer: Self-pay | Admitting: Family Medicine

## 2019-07-03 NOTE — Telephone Encounter (Signed)
Angie PT w/[facility] is calling and would like order to  Evaluate this pt to have physical therapy for weakness and difficulty getting around . Pt had a visit with md in aug 2020

## 2019-07-04 NOTE — Telephone Encounter (Signed)
Verbal order given  

## 2019-07-06 DIAGNOSIS — I69351 Hemiplegia and hemiparesis following cerebral infarction affecting right dominant side: Secondary | ICD-10-CM | POA: Diagnosis not present

## 2019-07-06 DIAGNOSIS — D62 Acute posthemorrhagic anemia: Secondary | ICD-10-CM | POA: Diagnosis not present

## 2019-07-06 DIAGNOSIS — M810 Age-related osteoporosis without current pathological fracture: Secondary | ICD-10-CM | POA: Diagnosis not present

## 2019-07-06 DIAGNOSIS — F319 Bipolar disorder, unspecified: Secondary | ICD-10-CM | POA: Diagnosis not present

## 2019-07-06 DIAGNOSIS — E039 Hypothyroidism, unspecified: Secondary | ICD-10-CM | POA: Diagnosis not present

## 2019-07-06 DIAGNOSIS — I10 Essential (primary) hypertension: Secondary | ICD-10-CM | POA: Diagnosis not present

## 2019-07-06 DIAGNOSIS — Z7982 Long term (current) use of aspirin: Secondary | ICD-10-CM | POA: Diagnosis not present

## 2019-07-06 DIAGNOSIS — I700 Atherosclerosis of aorta: Secondary | ICD-10-CM | POA: Diagnosis not present

## 2019-07-06 DIAGNOSIS — K589 Irritable bowel syndrome without diarrhea: Secondary | ICD-10-CM | POA: Diagnosis not present

## 2019-07-06 DIAGNOSIS — M1711 Unilateral primary osteoarthritis, right knee: Secondary | ICD-10-CM | POA: Diagnosis not present

## 2019-07-06 DIAGNOSIS — Z87891 Personal history of nicotine dependence: Secondary | ICD-10-CM | POA: Diagnosis not present

## 2019-07-06 DIAGNOSIS — J449 Chronic obstructive pulmonary disease, unspecified: Secondary | ICD-10-CM | POA: Diagnosis not present

## 2019-07-06 DIAGNOSIS — G4700 Insomnia, unspecified: Secondary | ICD-10-CM | POA: Diagnosis not present

## 2019-07-06 DIAGNOSIS — L853 Xerosis cutis: Secondary | ICD-10-CM | POA: Diagnosis not present

## 2019-07-06 DIAGNOSIS — Z96651 Presence of right artificial knee joint: Secondary | ICD-10-CM | POA: Diagnosis not present

## 2019-07-10 DIAGNOSIS — J449 Chronic obstructive pulmonary disease, unspecified: Secondary | ICD-10-CM | POA: Diagnosis not present

## 2019-07-10 DIAGNOSIS — I69351 Hemiplegia and hemiparesis following cerebral infarction affecting right dominant side: Secondary | ICD-10-CM | POA: Diagnosis not present

## 2019-07-10 DIAGNOSIS — I10 Essential (primary) hypertension: Secondary | ICD-10-CM | POA: Diagnosis not present

## 2019-07-10 DIAGNOSIS — F319 Bipolar disorder, unspecified: Secondary | ICD-10-CM | POA: Diagnosis not present

## 2019-07-10 DIAGNOSIS — I700 Atherosclerosis of aorta: Secondary | ICD-10-CM | POA: Diagnosis not present

## 2019-07-10 DIAGNOSIS — D62 Acute posthemorrhagic anemia: Secondary | ICD-10-CM | POA: Diagnosis not present

## 2019-07-11 NOTE — Telephone Encounter (Signed)
Trish with [facility] is calling in for approval for PT   Frequency: 2 week 7; 1 week 1 - starting this week.    CB: [phone]

## 2019-07-11 NOTE — Telephone Encounter (Signed)
LMOM for Trish w/ verbal orders.

## 2019-07-13 DIAGNOSIS — I69351 Hemiplegia and hemiparesis following cerebral infarction affecting right dominant side: Secondary | ICD-10-CM | POA: Diagnosis not present

## 2019-07-13 DIAGNOSIS — D62 Acute posthemorrhagic anemia: Secondary | ICD-10-CM | POA: Diagnosis not present

## 2019-07-13 DIAGNOSIS — F319 Bipolar disorder, unspecified: Secondary | ICD-10-CM | POA: Diagnosis not present

## 2019-07-13 DIAGNOSIS — I10 Essential (primary) hypertension: Secondary | ICD-10-CM | POA: Diagnosis not present

## 2019-07-13 DIAGNOSIS — I700 Atherosclerosis of aorta: Secondary | ICD-10-CM | POA: Diagnosis not present

## 2019-07-13 DIAGNOSIS — J449 Chronic obstructive pulmonary disease, unspecified: Secondary | ICD-10-CM | POA: Diagnosis not present

## 2019-07-17 DIAGNOSIS — I700 Atherosclerosis of aorta: Secondary | ICD-10-CM | POA: Diagnosis not present

## 2019-07-17 DIAGNOSIS — J449 Chronic obstructive pulmonary disease, unspecified: Secondary | ICD-10-CM | POA: Diagnosis not present

## 2019-07-17 DIAGNOSIS — I10 Essential (primary) hypertension: Secondary | ICD-10-CM | POA: Diagnosis not present

## 2019-07-17 DIAGNOSIS — I69351 Hemiplegia and hemiparesis following cerebral infarction affecting right dominant side: Secondary | ICD-10-CM | POA: Diagnosis not present

## 2019-07-17 DIAGNOSIS — D62 Acute posthemorrhagic anemia: Secondary | ICD-10-CM | POA: Diagnosis not present

## 2019-07-17 DIAGNOSIS — F319 Bipolar disorder, unspecified: Secondary | ICD-10-CM | POA: Diagnosis not present

## 2019-07-20 DIAGNOSIS — I10 Essential (primary) hypertension: Secondary | ICD-10-CM | POA: Diagnosis not present

## 2019-07-20 DIAGNOSIS — D62 Acute posthemorrhagic anemia: Secondary | ICD-10-CM | POA: Diagnosis not present

## 2019-07-20 DIAGNOSIS — F319 Bipolar disorder, unspecified: Secondary | ICD-10-CM | POA: Diagnosis not present

## 2019-07-20 DIAGNOSIS — J449 Chronic obstructive pulmonary disease, unspecified: Secondary | ICD-10-CM | POA: Diagnosis not present

## 2019-07-20 DIAGNOSIS — I69351 Hemiplegia and hemiparesis following cerebral infarction affecting right dominant side: Secondary | ICD-10-CM | POA: Diagnosis not present

## 2019-07-20 DIAGNOSIS — I700 Atherosclerosis of aorta: Secondary | ICD-10-CM | POA: Diagnosis not present

## 2019-07-24 DIAGNOSIS — D62 Acute posthemorrhagic anemia: Secondary | ICD-10-CM | POA: Diagnosis not present

## 2019-07-24 DIAGNOSIS — I10 Essential (primary) hypertension: Secondary | ICD-10-CM | POA: Diagnosis not present

## 2019-07-24 DIAGNOSIS — J449 Chronic obstructive pulmonary disease, unspecified: Secondary | ICD-10-CM | POA: Diagnosis not present

## 2019-07-24 DIAGNOSIS — I69351 Hemiplegia and hemiparesis following cerebral infarction affecting right dominant side: Secondary | ICD-10-CM | POA: Diagnosis not present

## 2019-07-24 DIAGNOSIS — I700 Atherosclerosis of aorta: Secondary | ICD-10-CM | POA: Diagnosis not present

## 2019-07-24 DIAGNOSIS — F319 Bipolar disorder, unspecified: Secondary | ICD-10-CM | POA: Diagnosis not present

## 2019-07-27 DIAGNOSIS — F319 Bipolar disorder, unspecified: Secondary | ICD-10-CM | POA: Diagnosis not present

## 2019-07-27 DIAGNOSIS — D62 Acute posthemorrhagic anemia: Secondary | ICD-10-CM | POA: Diagnosis not present

## 2019-07-27 DIAGNOSIS — I69351 Hemiplegia and hemiparesis following cerebral infarction affecting right dominant side: Secondary | ICD-10-CM | POA: Diagnosis not present

## 2019-07-27 DIAGNOSIS — I10 Essential (primary) hypertension: Secondary | ICD-10-CM | POA: Diagnosis not present

## 2019-07-27 DIAGNOSIS — J449 Chronic obstructive pulmonary disease, unspecified: Secondary | ICD-10-CM | POA: Diagnosis not present

## 2019-07-27 DIAGNOSIS — I700 Atherosclerosis of aorta: Secondary | ICD-10-CM | POA: Diagnosis not present

## 2019-07-30 DIAGNOSIS — F319 Bipolar disorder, unspecified: Secondary | ICD-10-CM | POA: Diagnosis not present

## 2019-07-30 DIAGNOSIS — I10 Essential (primary) hypertension: Secondary | ICD-10-CM | POA: Diagnosis not present

## 2019-07-30 DIAGNOSIS — I700 Atherosclerosis of aorta: Secondary | ICD-10-CM | POA: Diagnosis not present

## 2019-07-30 DIAGNOSIS — J449 Chronic obstructive pulmonary disease, unspecified: Secondary | ICD-10-CM | POA: Diagnosis not present

## 2019-07-30 DIAGNOSIS — D62 Acute posthemorrhagic anemia: Secondary | ICD-10-CM | POA: Diagnosis not present

## 2019-07-30 DIAGNOSIS — I69351 Hemiplegia and hemiparesis following cerebral infarction affecting right dominant side: Secondary | ICD-10-CM | POA: Diagnosis not present

## 2019-08-01 DIAGNOSIS — F319 Bipolar disorder, unspecified: Secondary | ICD-10-CM | POA: Diagnosis not present

## 2019-08-01 DIAGNOSIS — I700 Atherosclerosis of aorta: Secondary | ICD-10-CM | POA: Diagnosis not present

## 2019-08-01 DIAGNOSIS — J449 Chronic obstructive pulmonary disease, unspecified: Secondary | ICD-10-CM | POA: Diagnosis not present

## 2019-08-01 DIAGNOSIS — D62 Acute posthemorrhagic anemia: Secondary | ICD-10-CM | POA: Diagnosis not present

## 2019-08-01 DIAGNOSIS — I10 Essential (primary) hypertension: Secondary | ICD-10-CM | POA: Diagnosis not present

## 2019-08-01 DIAGNOSIS — I69351 Hemiplegia and hemiparesis following cerebral infarction affecting right dominant side: Secondary | ICD-10-CM | POA: Diagnosis not present

## 2019-08-05 DIAGNOSIS — I69351 Hemiplegia and hemiparesis following cerebral infarction affecting right dominant side: Secondary | ICD-10-CM | POA: Diagnosis not present

## 2019-08-05 DIAGNOSIS — I700 Atherosclerosis of aorta: Secondary | ICD-10-CM | POA: Diagnosis not present

## 2019-08-05 DIAGNOSIS — L853 Xerosis cutis: Secondary | ICD-10-CM | POA: Diagnosis not present

## 2019-08-05 DIAGNOSIS — Z87891 Personal history of nicotine dependence: Secondary | ICD-10-CM | POA: Diagnosis not present

## 2019-08-05 DIAGNOSIS — Z96651 Presence of right artificial knee joint: Secondary | ICD-10-CM | POA: Diagnosis not present

## 2019-08-05 DIAGNOSIS — F319 Bipolar disorder, unspecified: Secondary | ICD-10-CM | POA: Diagnosis not present

## 2019-08-05 DIAGNOSIS — I10 Essential (primary) hypertension: Secondary | ICD-10-CM | POA: Diagnosis not present

## 2019-08-05 DIAGNOSIS — M1711 Unilateral primary osteoarthritis, right knee: Secondary | ICD-10-CM | POA: Diagnosis not present

## 2019-08-05 DIAGNOSIS — M810 Age-related osteoporosis without current pathological fracture: Secondary | ICD-10-CM | POA: Diagnosis not present

## 2019-08-05 DIAGNOSIS — J449 Chronic obstructive pulmonary disease, unspecified: Secondary | ICD-10-CM | POA: Diagnosis not present

## 2019-08-05 DIAGNOSIS — E039 Hypothyroidism, unspecified: Secondary | ICD-10-CM | POA: Diagnosis not present

## 2019-08-05 DIAGNOSIS — G4700 Insomnia, unspecified: Secondary | ICD-10-CM | POA: Diagnosis not present

## 2019-08-05 DIAGNOSIS — D62 Acute posthemorrhagic anemia: Secondary | ICD-10-CM | POA: Diagnosis not present

## 2019-08-05 DIAGNOSIS — K589 Irritable bowel syndrome without diarrhea: Secondary | ICD-10-CM | POA: Diagnosis not present

## 2019-08-05 DIAGNOSIS — Z7982 Long term (current) use of aspirin: Secondary | ICD-10-CM | POA: Diagnosis not present

## 2019-08-10 DIAGNOSIS — I69351 Hemiplegia and hemiparesis following cerebral infarction affecting right dominant side: Secondary | ICD-10-CM | POA: Diagnosis not present

## 2019-08-10 DIAGNOSIS — J449 Chronic obstructive pulmonary disease, unspecified: Secondary | ICD-10-CM | POA: Diagnosis not present

## 2019-08-10 DIAGNOSIS — I10 Essential (primary) hypertension: Secondary | ICD-10-CM | POA: Diagnosis not present

## 2019-08-10 DIAGNOSIS — D62 Acute posthemorrhagic anemia: Secondary | ICD-10-CM | POA: Diagnosis not present

## 2019-08-10 DIAGNOSIS — F319 Bipolar disorder, unspecified: Secondary | ICD-10-CM | POA: Diagnosis not present

## 2019-08-10 DIAGNOSIS — I700 Atherosclerosis of aorta: Secondary | ICD-10-CM | POA: Diagnosis not present

## 2019-08-14 ENCOUNTER — Telehealth: Payer: Self-pay | Admitting: Family Medicine

## 2019-08-14 DIAGNOSIS — D62 Acute posthemorrhagic anemia: Secondary | ICD-10-CM | POA: Diagnosis not present

## 2019-08-14 DIAGNOSIS — I10 Essential (primary) hypertension: Secondary | ICD-10-CM | POA: Diagnosis not present

## 2019-08-14 DIAGNOSIS — J449 Chronic obstructive pulmonary disease, unspecified: Secondary | ICD-10-CM | POA: Diagnosis not present

## 2019-08-14 DIAGNOSIS — I69351 Hemiplegia and hemiparesis following cerebral infarction affecting right dominant side: Secondary | ICD-10-CM | POA: Diagnosis not present

## 2019-08-14 DIAGNOSIS — F319 Bipolar disorder, unspecified: Secondary | ICD-10-CM | POA: Diagnosis not present

## 2019-08-14 DIAGNOSIS — I700 Atherosclerosis of aorta: Secondary | ICD-10-CM | POA: Diagnosis not present

## 2019-08-14 NOTE — Telephone Encounter (Signed)
Have not located a form as of yet

## 2019-08-14 NOTE — Telephone Encounter (Signed)
[name] [name], from [facility], called stating she faxed over paperwork yesterday for pt regarding her knees that are getting worse. [name] [name] states pt has been undergoing PT and that they are still causing her pain and popping. [name] [name] states pt has been requesting pain medication. [name] [name] also states she has swelling and bruising around her right wrist. [name] [name] states pt is not having any pain, but they are requesting to have an x ray done. Please advise.    [phone]

## 2019-08-15 NOTE — Telephone Encounter (Signed)
Spoke with Wells Guiles and she will be refaxing paperwork.  She states that stamped signature is ok if you would like me to get that right back to them if you review message today.  She stating that patient is wanting something for pain for her knees.

## 2019-08-15 NOTE — Telephone Encounter (Signed)
Have them do the xray they request and give them an order to apply Voltaren Gel to b/l knees bid prn pain and give her 1 Acetaminophen ES 500 mg 1 tab po po bid prn pain, suggest they do both scheduled for a week and then as needed after that. I do not believe I left any paper work I got undone yesterday? It is fine to stamp the order. Thanks

## 2019-08-16 NOTE — Telephone Encounter (Signed)
Called [place] because I had questions about form.  Spoke with [name] and she stated that patient has been moved to another team and the in house doctors will take care of this.  So the form is no longer needed.  Form discarded.

## 2019-08-17 ENCOUNTER — Telehealth: Payer: Self-pay | Admitting: Family Medicine

## 2019-08-17 DIAGNOSIS — F319 Bipolar disorder, unspecified: Secondary | ICD-10-CM | POA: Diagnosis not present

## 2019-08-17 DIAGNOSIS — J449 Chronic obstructive pulmonary disease, unspecified: Secondary | ICD-10-CM | POA: Diagnosis not present

## 2019-08-17 DIAGNOSIS — D62 Acute posthemorrhagic anemia: Secondary | ICD-10-CM | POA: Diagnosis not present

## 2019-08-17 DIAGNOSIS — I10 Essential (primary) hypertension: Secondary | ICD-10-CM | POA: Diagnosis not present

## 2019-08-17 DIAGNOSIS — I700 Atherosclerosis of aorta: Secondary | ICD-10-CM | POA: Diagnosis not present

## 2019-08-17 DIAGNOSIS — I69351 Hemiplegia and hemiparesis following cerebral infarction affecting right dominant side: Secondary | ICD-10-CM | POA: Diagnosis not present

## 2019-08-17 NOTE — Telephone Encounter (Signed)
[name] [name], I know you have been handling her  Please advise

## 2019-08-17 NOTE — Telephone Encounter (Signed)
[name] [name] from [facility] is calling to report that incorrect information was shared yesterday regarding the patient.  [name] [name] is still a patient of dr. Randel Pigg and [name] is still needing orders.  Orders are related to an x ray and something related to pain. Can orders be faxed/ Fax- [phone] Cb- [phone]

## 2019-08-17 NOTE — Telephone Encounter (Signed)
Orders faxed over to [facility]

## 2019-08-19 ENCOUNTER — Other Ambulatory Visit: Payer: Self-pay | Admitting: Family Medicine

## 2019-08-20 ENCOUNTER — Encounter: Payer: Self-pay | Admitting: Family Medicine

## 2019-08-21 DIAGNOSIS — J449 Chronic obstructive pulmonary disease, unspecified: Secondary | ICD-10-CM | POA: Diagnosis not present

## 2019-08-21 DIAGNOSIS — D62 Acute posthemorrhagic anemia: Secondary | ICD-10-CM | POA: Diagnosis not present

## 2019-08-21 DIAGNOSIS — I10 Essential (primary) hypertension: Secondary | ICD-10-CM | POA: Diagnosis not present

## 2019-08-21 DIAGNOSIS — F319 Bipolar disorder, unspecified: Secondary | ICD-10-CM | POA: Diagnosis not present

## 2019-08-21 DIAGNOSIS — I69351 Hemiplegia and hemiparesis following cerebral infarction affecting right dominant side: Secondary | ICD-10-CM | POA: Diagnosis not present

## 2019-08-21 DIAGNOSIS — I700 Atherosclerosis of aorta: Secondary | ICD-10-CM | POA: Diagnosis not present

## 2019-08-22 ENCOUNTER — Telehealth: Payer: Self-pay | Admitting: *Deleted

## 2019-08-22 NOTE — Telephone Encounter (Signed)
Received results for xray of hand.  Called Wells Guiles at [facility] about results and she stated they had the results and PT has been ordered.    Also Mel Almond needed patient to be scheduled for Prolia.  Wells Guiles stated that they have transportation days or her son could bring her to get injection.  Appointment made for 11/18/20l

## 2019-08-24 DIAGNOSIS — I10 Essential (primary) hypertension: Secondary | ICD-10-CM | POA: Diagnosis not present

## 2019-08-24 DIAGNOSIS — J449 Chronic obstructive pulmonary disease, unspecified: Secondary | ICD-10-CM | POA: Diagnosis not present

## 2019-08-24 DIAGNOSIS — D62 Acute posthemorrhagic anemia: Secondary | ICD-10-CM | POA: Diagnosis not present

## 2019-08-24 DIAGNOSIS — I69351 Hemiplegia and hemiparesis following cerebral infarction affecting right dominant side: Secondary | ICD-10-CM | POA: Diagnosis not present

## 2019-08-24 DIAGNOSIS — F319 Bipolar disorder, unspecified: Secondary | ICD-10-CM | POA: Diagnosis not present

## 2019-08-24 DIAGNOSIS — I700 Atherosclerosis of aorta: Secondary | ICD-10-CM | POA: Diagnosis not present

## 2019-08-29 ENCOUNTER — Telehealth: Payer: Self-pay | Admitting: Family Medicine

## 2019-08-29 ENCOUNTER — Telehealth: Payer: Self-pay | Admitting: *Deleted

## 2019-08-29 NOTE — Telephone Encounter (Signed)
OK to give verbal order to restart PT

## 2019-08-29 NOTE — Telephone Encounter (Signed)
Copied from [place] [phone]. Topic: General - Other >> Aug 29, 2019 12:50 PM Yvette Rack wrote: Reason for CRM: Wells Guiles with [facility] [employer] requests that a signed x-ray order be faxed to [phone]

## 2019-08-29 NOTE — Telephone Encounter (Signed)
[name] [name] from [facility] call requesting  Permission to readmit her for PT 2x a week  Strength and balance  Call back [phone]

## 2019-08-30 NOTE — Telephone Encounter (Signed)
Verbal orders given  

## 2019-08-30 NOTE — Telephone Encounter (Signed)
[name] [name] stated that there was no signature on first order and she will fax back over to sign and send back.

## 2019-08-31 DIAGNOSIS — J449 Chronic obstructive pulmonary disease, unspecified: Secondary | ICD-10-CM | POA: Diagnosis not present

## 2019-08-31 DIAGNOSIS — I700 Atherosclerosis of aorta: Secondary | ICD-10-CM | POA: Diagnosis not present

## 2019-08-31 DIAGNOSIS — I69351 Hemiplegia and hemiparesis following cerebral infarction affecting right dominant side: Secondary | ICD-10-CM | POA: Diagnosis not present

## 2019-08-31 DIAGNOSIS — D62 Acute posthemorrhagic anemia: Secondary | ICD-10-CM | POA: Diagnosis not present

## 2019-08-31 DIAGNOSIS — I10 Essential (primary) hypertension: Secondary | ICD-10-CM | POA: Diagnosis not present

## 2019-08-31 DIAGNOSIS — F319 Bipolar disorder, unspecified: Secondary | ICD-10-CM | POA: Diagnosis not present

## 2019-08-31 NOTE — Telephone Encounter (Signed)
Old order refaxed back over with signature.

## 2019-09-04 DIAGNOSIS — E039 Hypothyroidism, unspecified: Secondary | ICD-10-CM | POA: Diagnosis not present

## 2019-09-04 DIAGNOSIS — M810 Age-related osteoporosis without current pathological fracture: Secondary | ICD-10-CM | POA: Diagnosis not present

## 2019-09-04 DIAGNOSIS — G4700 Insomnia, unspecified: Secondary | ICD-10-CM | POA: Diagnosis not present

## 2019-09-04 DIAGNOSIS — Z7982 Long term (current) use of aspirin: Secondary | ICD-10-CM | POA: Diagnosis not present

## 2019-09-04 DIAGNOSIS — K589 Irritable bowel syndrome without diarrhea: Secondary | ICD-10-CM | POA: Diagnosis not present

## 2019-09-04 DIAGNOSIS — M1711 Unilateral primary osteoarthritis, right knee: Secondary | ICD-10-CM | POA: Diagnosis not present

## 2019-09-04 DIAGNOSIS — I10 Essential (primary) hypertension: Secondary | ICD-10-CM | POA: Diagnosis not present

## 2019-09-04 DIAGNOSIS — L853 Xerosis cutis: Secondary | ICD-10-CM | POA: Diagnosis not present

## 2019-09-04 DIAGNOSIS — I69351 Hemiplegia and hemiparesis following cerebral infarction affecting right dominant side: Secondary | ICD-10-CM | POA: Diagnosis not present

## 2019-09-04 DIAGNOSIS — Z87891 Personal history of nicotine dependence: Secondary | ICD-10-CM | POA: Diagnosis not present

## 2019-09-04 DIAGNOSIS — F319 Bipolar disorder, unspecified: Secondary | ICD-10-CM | POA: Diagnosis not present

## 2019-09-04 DIAGNOSIS — Z96651 Presence of right artificial knee joint: Secondary | ICD-10-CM | POA: Diagnosis not present

## 2019-09-04 DIAGNOSIS — I700 Atherosclerosis of aorta: Secondary | ICD-10-CM | POA: Diagnosis not present

## 2019-09-04 DIAGNOSIS — J449 Chronic obstructive pulmonary disease, unspecified: Secondary | ICD-10-CM | POA: Diagnosis not present

## 2019-09-05 ENCOUNTER — Ambulatory Visit: Payer: Medicare Other

## 2019-09-11 ENCOUNTER — Telehealth: Payer: Self-pay

## 2019-09-11 NOTE — Telephone Encounter (Signed)
I believe she is able to come out but if she is not answering just send her a letter stating that she needs to contact us to schedule her Prolia. That is all we can really do. If she chooses not to proceed that is her decision.

## 2019-09-11 NOTE — Telephone Encounter (Signed)
Ive tried several attempts to get this patient scheduled for Prolia, is the patient able to come in that you know of or is she in an assisted living facility and not able to come out? If not, is it necessary to continue prolia at this time? Unable to receive call back for patient to schedule appointment.

## 2019-09-12 NOTE — Telephone Encounter (Signed)
Letter mailed to patient.

## 2019-09-19 ENCOUNTER — Ambulatory Visit (INDEPENDENT_AMBULATORY_CARE_PROVIDER_SITE_OTHER): Payer: Medicare Other | Admitting: *Deleted

## 2019-09-19 ENCOUNTER — Telehealth: Payer: Self-pay

## 2019-09-19 ENCOUNTER — Other Ambulatory Visit: Payer: Self-pay

## 2019-09-19 DIAGNOSIS — M810 Age-related osteoporosis without current pathological fracture: Secondary | ICD-10-CM

## 2019-09-19 MED ORDER — DENOSUMAB 60 MG/ML ~~LOC~~ SOSY
60.0000 mg | PREFILLED_SYRINGE | Freq: Once | SUBCUTANEOUS | Status: AC
  Administered 2019-09-19: 60 mg via SUBCUTANEOUS

## 2019-09-19 NOTE — Telephone Encounter (Signed)
Copied from [place] [phone]. Topic: General - Other >> Sep 19, 2019  4:25 PM Leward Quan A wrote: Reason for CRM: Patient son Hannaley Moeckel called to say that his mom is now in [place] home and he would like for Dr Charlett Blake or her nurse to call and give permission for her to get her Prolia injection.

## 2019-09-19 NOTE — Progress Notes (Signed)
Patient in today for Prolia injection.  Prolia given left arm and patient tolerated well.  Patient was here with caregiver from [place].    For next Prolia injection we are to call [facility] to schedule.  [phone]

## 2019-09-20 NOTE — Telephone Encounter (Signed)
Mel Almond, Can you help with this, is it time for patient to get Prolia Please advise

## 2019-09-20 NOTE — Telephone Encounter (Signed)
Patient received prolia yesterday 09/19/2019

## 2019-10-04 DIAGNOSIS — L853 Xerosis cutis: Secondary | ICD-10-CM | POA: Diagnosis not present

## 2019-10-04 DIAGNOSIS — J449 Chronic obstructive pulmonary disease, unspecified: Secondary | ICD-10-CM | POA: Diagnosis not present

## 2019-10-04 DIAGNOSIS — I69351 Hemiplegia and hemiparesis following cerebral infarction affecting right dominant side: Secondary | ICD-10-CM | POA: Diagnosis not present

## 2019-10-04 DIAGNOSIS — I700 Atherosclerosis of aorta: Secondary | ICD-10-CM | POA: Diagnosis not present

## 2019-10-04 DIAGNOSIS — Z87891 Personal history of nicotine dependence: Secondary | ICD-10-CM | POA: Diagnosis not present

## 2019-10-04 DIAGNOSIS — Z7982 Long term (current) use of aspirin: Secondary | ICD-10-CM | POA: Diagnosis not present

## 2019-10-04 DIAGNOSIS — M1711 Unilateral primary osteoarthritis, right knee: Secondary | ICD-10-CM | POA: Diagnosis not present

## 2019-10-04 DIAGNOSIS — I10 Essential (primary) hypertension: Secondary | ICD-10-CM | POA: Diagnosis not present

## 2019-10-04 DIAGNOSIS — M810 Age-related osteoporosis without current pathological fracture: Secondary | ICD-10-CM | POA: Diagnosis not present

## 2019-10-04 DIAGNOSIS — G4700 Insomnia, unspecified: Secondary | ICD-10-CM | POA: Diagnosis not present

## 2019-10-04 DIAGNOSIS — K589 Irritable bowel syndrome without diarrhea: Secondary | ICD-10-CM | POA: Diagnosis not present

## 2019-10-04 DIAGNOSIS — E039 Hypothyroidism, unspecified: Secondary | ICD-10-CM | POA: Diagnosis not present

## 2019-10-04 DIAGNOSIS — Z96651 Presence of right artificial knee joint: Secondary | ICD-10-CM | POA: Diagnosis not present

## 2019-10-04 DIAGNOSIS — F319 Bipolar disorder, unspecified: Secondary | ICD-10-CM | POA: Diagnosis not present

## 2019-10-08 DIAGNOSIS — M1711 Unilateral primary osteoarthritis, right knee: Secondary | ICD-10-CM | POA: Diagnosis not present

## 2019-10-08 DIAGNOSIS — I10 Essential (primary) hypertension: Secondary | ICD-10-CM | POA: Diagnosis not present

## 2019-10-08 DIAGNOSIS — F319 Bipolar disorder, unspecified: Secondary | ICD-10-CM | POA: Diagnosis not present

## 2019-10-08 DIAGNOSIS — I69351 Hemiplegia and hemiparesis following cerebral infarction affecting right dominant side: Secondary | ICD-10-CM | POA: Diagnosis not present

## 2019-10-08 DIAGNOSIS — J449 Chronic obstructive pulmonary disease, unspecified: Secondary | ICD-10-CM | POA: Diagnosis not present

## 2019-10-08 DIAGNOSIS — I700 Atherosclerosis of aorta: Secondary | ICD-10-CM | POA: Diagnosis not present

## 2019-10-10 DIAGNOSIS — I69351 Hemiplegia and hemiparesis following cerebral infarction affecting right dominant side: Secondary | ICD-10-CM | POA: Diagnosis not present

## 2019-10-10 DIAGNOSIS — J449 Chronic obstructive pulmonary disease, unspecified: Secondary | ICD-10-CM | POA: Diagnosis not present

## 2019-10-10 DIAGNOSIS — I700 Atherosclerosis of aorta: Secondary | ICD-10-CM | POA: Diagnosis not present

## 2019-10-10 DIAGNOSIS — I10 Essential (primary) hypertension: Secondary | ICD-10-CM | POA: Diagnosis not present

## 2019-10-10 DIAGNOSIS — M1711 Unilateral primary osteoarthritis, right knee: Secondary | ICD-10-CM | POA: Diagnosis not present

## 2019-10-10 DIAGNOSIS — F319 Bipolar disorder, unspecified: Secondary | ICD-10-CM | POA: Diagnosis not present

## 2019-10-15 DIAGNOSIS — F319 Bipolar disorder, unspecified: Secondary | ICD-10-CM | POA: Diagnosis not present

## 2019-10-15 DIAGNOSIS — I700 Atherosclerosis of aorta: Secondary | ICD-10-CM | POA: Diagnosis not present

## 2019-10-15 DIAGNOSIS — M1711 Unilateral primary osteoarthritis, right knee: Secondary | ICD-10-CM | POA: Diagnosis not present

## 2019-10-15 DIAGNOSIS — J449 Chronic obstructive pulmonary disease, unspecified: Secondary | ICD-10-CM | POA: Diagnosis not present

## 2019-10-15 DIAGNOSIS — I69351 Hemiplegia and hemiparesis following cerebral infarction affecting right dominant side: Secondary | ICD-10-CM | POA: Diagnosis not present

## 2019-10-15 DIAGNOSIS — I10 Essential (primary) hypertension: Secondary | ICD-10-CM | POA: Diagnosis not present

## 2019-10-18 DIAGNOSIS — I700 Atherosclerosis of aorta: Secondary | ICD-10-CM | POA: Diagnosis not present

## 2019-10-18 DIAGNOSIS — M1711 Unilateral primary osteoarthritis, right knee: Secondary | ICD-10-CM | POA: Diagnosis not present

## 2019-10-18 DIAGNOSIS — I10 Essential (primary) hypertension: Secondary | ICD-10-CM | POA: Diagnosis not present

## 2019-10-18 DIAGNOSIS — J449 Chronic obstructive pulmonary disease, unspecified: Secondary | ICD-10-CM | POA: Diagnosis not present

## 2019-10-18 DIAGNOSIS — F319 Bipolar disorder, unspecified: Secondary | ICD-10-CM | POA: Diagnosis not present

## 2019-10-18 DIAGNOSIS — I69351 Hemiplegia and hemiparesis following cerebral infarction affecting right dominant side: Secondary | ICD-10-CM | POA: Diagnosis not present

## 2019-10-22 DIAGNOSIS — L84 Corns and callosities: Secondary | ICD-10-CM | POA: Diagnosis not present

## 2019-10-22 DIAGNOSIS — Q845 Enlarged and hypertrophic nails: Secondary | ICD-10-CM | POA: Diagnosis not present

## 2019-10-22 DIAGNOSIS — L603 Nail dystrophy: Secondary | ICD-10-CM | POA: Diagnosis not present

## 2019-10-22 DIAGNOSIS — I739 Peripheral vascular disease, unspecified: Secondary | ICD-10-CM | POA: Diagnosis not present

## 2019-10-23 ENCOUNTER — Telehealth: Payer: Self-pay | Admitting: Family Medicine

## 2019-10-23 DIAGNOSIS — I10 Essential (primary) hypertension: Secondary | ICD-10-CM | POA: Diagnosis not present

## 2019-10-23 DIAGNOSIS — I700 Atherosclerosis of aorta: Secondary | ICD-10-CM | POA: Diagnosis not present

## 2019-10-23 DIAGNOSIS — F319 Bipolar disorder, unspecified: Secondary | ICD-10-CM | POA: Diagnosis not present

## 2019-10-23 DIAGNOSIS — J449 Chronic obstructive pulmonary disease, unspecified: Secondary | ICD-10-CM | POA: Diagnosis not present

## 2019-10-23 DIAGNOSIS — I69351 Hemiplegia and hemiparesis following cerebral infarction affecting right dominant side: Secondary | ICD-10-CM | POA: Diagnosis not present

## 2019-10-23 DIAGNOSIS — M1711 Unilateral primary osteoarthritis, right knee: Secondary | ICD-10-CM | POA: Diagnosis not present

## 2019-10-23 NOTE — Telephone Encounter (Signed)
Copied from [place] [phone]. Topic: Quick Communication - Home Health Verbal Orders >> Oct 23, 2019  4:47 PM Yvette Rack wrote: Caller/Agency: Trish with [facility] Number: [phone] Requesting OT/PT/Skilled Nursing/Social Work/Speech Therapy: PT Frequency:  2 times a week for 4 weeks

## 2019-10-26 NOTE — Telephone Encounter (Signed)
Verbal orders given  

## 2019-11-01 NOTE — Telephone Encounter (Signed)
Received call from [facility] stating she did not receive below orders. Verbal orders given again.

## 2019-11-02 DIAGNOSIS — M1711 Unilateral primary osteoarthritis, right knee: Secondary | ICD-10-CM | POA: Diagnosis not present

## 2019-11-02 DIAGNOSIS — I10 Essential (primary) hypertension: Secondary | ICD-10-CM | POA: Diagnosis not present

## 2019-11-02 DIAGNOSIS — I700 Atherosclerosis of aorta: Secondary | ICD-10-CM | POA: Diagnosis not present

## 2019-11-02 DIAGNOSIS — J449 Chronic obstructive pulmonary disease, unspecified: Secondary | ICD-10-CM | POA: Diagnosis not present

## 2019-11-02 DIAGNOSIS — F319 Bipolar disorder, unspecified: Secondary | ICD-10-CM | POA: Diagnosis not present

## 2019-11-02 DIAGNOSIS — I69351 Hemiplegia and hemiparesis following cerebral infarction affecting right dominant side: Secondary | ICD-10-CM | POA: Diagnosis not present

## 2019-11-03 DIAGNOSIS — L853 Xerosis cutis: Secondary | ICD-10-CM | POA: Diagnosis not present

## 2019-11-03 DIAGNOSIS — E039 Hypothyroidism, unspecified: Secondary | ICD-10-CM | POA: Diagnosis not present

## 2019-11-03 DIAGNOSIS — M810 Age-related osteoporosis without current pathological fracture: Secondary | ICD-10-CM | POA: Diagnosis not present

## 2019-11-03 DIAGNOSIS — G4700 Insomnia, unspecified: Secondary | ICD-10-CM | POA: Diagnosis not present

## 2019-11-03 DIAGNOSIS — I700 Atherosclerosis of aorta: Secondary | ICD-10-CM | POA: Diagnosis not present

## 2019-11-03 DIAGNOSIS — Z96651 Presence of right artificial knee joint: Secondary | ICD-10-CM | POA: Diagnosis not present

## 2019-11-03 DIAGNOSIS — K589 Irritable bowel syndrome without diarrhea: Secondary | ICD-10-CM | POA: Diagnosis not present

## 2019-11-03 DIAGNOSIS — M1711 Unilateral primary osteoarthritis, right knee: Secondary | ICD-10-CM | POA: Diagnosis not present

## 2019-11-03 DIAGNOSIS — I10 Essential (primary) hypertension: Secondary | ICD-10-CM | POA: Diagnosis not present

## 2019-11-03 DIAGNOSIS — Z7982 Long term (current) use of aspirin: Secondary | ICD-10-CM | POA: Diagnosis not present

## 2019-11-03 DIAGNOSIS — J449 Chronic obstructive pulmonary disease, unspecified: Secondary | ICD-10-CM | POA: Diagnosis not present

## 2019-11-03 DIAGNOSIS — Z87891 Personal history of nicotine dependence: Secondary | ICD-10-CM | POA: Diagnosis not present

## 2019-11-03 DIAGNOSIS — F319 Bipolar disorder, unspecified: Secondary | ICD-10-CM | POA: Diagnosis not present

## 2019-11-03 DIAGNOSIS — I69351 Hemiplegia and hemiparesis following cerebral infarction affecting right dominant side: Secondary | ICD-10-CM | POA: Diagnosis not present

## 2019-11-07 DIAGNOSIS — I700 Atherosclerosis of aorta: Secondary | ICD-10-CM | POA: Diagnosis not present

## 2019-11-07 DIAGNOSIS — F319 Bipolar disorder, unspecified: Secondary | ICD-10-CM | POA: Diagnosis not present

## 2019-11-07 DIAGNOSIS — I10 Essential (primary) hypertension: Secondary | ICD-10-CM | POA: Diagnosis not present

## 2019-11-07 DIAGNOSIS — I69351 Hemiplegia and hemiparesis following cerebral infarction affecting right dominant side: Secondary | ICD-10-CM | POA: Diagnosis not present

## 2019-11-07 DIAGNOSIS — J449 Chronic obstructive pulmonary disease, unspecified: Secondary | ICD-10-CM | POA: Diagnosis not present

## 2019-11-07 DIAGNOSIS — M1711 Unilateral primary osteoarthritis, right knee: Secondary | ICD-10-CM | POA: Diagnosis not present

## 2019-11-09 DIAGNOSIS — I69351 Hemiplegia and hemiparesis following cerebral infarction affecting right dominant side: Secondary | ICD-10-CM | POA: Diagnosis not present

## 2019-11-09 DIAGNOSIS — F319 Bipolar disorder, unspecified: Secondary | ICD-10-CM | POA: Diagnosis not present

## 2019-11-09 DIAGNOSIS — I10 Essential (primary) hypertension: Secondary | ICD-10-CM | POA: Diagnosis not present

## 2019-11-09 DIAGNOSIS — J449 Chronic obstructive pulmonary disease, unspecified: Secondary | ICD-10-CM | POA: Diagnosis not present

## 2019-11-09 DIAGNOSIS — M1711 Unilateral primary osteoarthritis, right knee: Secondary | ICD-10-CM | POA: Diagnosis not present

## 2019-11-09 DIAGNOSIS — I700 Atherosclerosis of aorta: Secondary | ICD-10-CM | POA: Diagnosis not present

## 2019-11-14 DIAGNOSIS — J449 Chronic obstructive pulmonary disease, unspecified: Secondary | ICD-10-CM | POA: Diagnosis not present

## 2019-11-14 DIAGNOSIS — I69351 Hemiplegia and hemiparesis following cerebral infarction affecting right dominant side: Secondary | ICD-10-CM | POA: Diagnosis not present

## 2019-11-14 DIAGNOSIS — I700 Atherosclerosis of aorta: Secondary | ICD-10-CM | POA: Diagnosis not present

## 2019-11-14 DIAGNOSIS — F319 Bipolar disorder, unspecified: Secondary | ICD-10-CM | POA: Diagnosis not present

## 2019-11-14 DIAGNOSIS — I10 Essential (primary) hypertension: Secondary | ICD-10-CM | POA: Diagnosis not present

## 2019-11-14 DIAGNOSIS — M1711 Unilateral primary osteoarthritis, right knee: Secondary | ICD-10-CM | POA: Diagnosis not present

## 2019-11-16 DIAGNOSIS — I10 Essential (primary) hypertension: Secondary | ICD-10-CM | POA: Diagnosis not present

## 2019-11-16 DIAGNOSIS — M1711 Unilateral primary osteoarthritis, right knee: Secondary | ICD-10-CM | POA: Diagnosis not present

## 2019-11-16 DIAGNOSIS — I69351 Hemiplegia and hemiparesis following cerebral infarction affecting right dominant side: Secondary | ICD-10-CM | POA: Diagnosis not present

## 2019-11-16 DIAGNOSIS — F319 Bipolar disorder, unspecified: Secondary | ICD-10-CM | POA: Diagnosis not present

## 2019-11-16 DIAGNOSIS — I700 Atherosclerosis of aorta: Secondary | ICD-10-CM | POA: Diagnosis not present

## 2019-11-16 DIAGNOSIS — J449 Chronic obstructive pulmonary disease, unspecified: Secondary | ICD-10-CM | POA: Diagnosis not present

## 2019-11-20 DIAGNOSIS — I700 Atherosclerosis of aorta: Secondary | ICD-10-CM | POA: Diagnosis not present

## 2019-11-20 DIAGNOSIS — I10 Essential (primary) hypertension: Secondary | ICD-10-CM | POA: Diagnosis not present

## 2019-11-20 DIAGNOSIS — F319 Bipolar disorder, unspecified: Secondary | ICD-10-CM | POA: Diagnosis not present

## 2019-11-20 DIAGNOSIS — M1711 Unilateral primary osteoarthritis, right knee: Secondary | ICD-10-CM | POA: Diagnosis not present

## 2019-11-20 DIAGNOSIS — J449 Chronic obstructive pulmonary disease, unspecified: Secondary | ICD-10-CM | POA: Diagnosis not present

## 2019-11-20 DIAGNOSIS — I69351 Hemiplegia and hemiparesis following cerebral infarction affecting right dominant side: Secondary | ICD-10-CM | POA: Diagnosis not present

## 2019-11-23 DIAGNOSIS — I10 Essential (primary) hypertension: Secondary | ICD-10-CM | POA: Diagnosis not present

## 2019-11-23 DIAGNOSIS — F319 Bipolar disorder, unspecified: Secondary | ICD-10-CM | POA: Diagnosis not present

## 2019-11-23 DIAGNOSIS — I69351 Hemiplegia and hemiparesis following cerebral infarction affecting right dominant side: Secondary | ICD-10-CM | POA: Diagnosis not present

## 2019-11-23 DIAGNOSIS — J449 Chronic obstructive pulmonary disease, unspecified: Secondary | ICD-10-CM | POA: Diagnosis not present

## 2019-11-23 DIAGNOSIS — I700 Atherosclerosis of aorta: Secondary | ICD-10-CM | POA: Diagnosis not present

## 2019-11-23 DIAGNOSIS — M1711 Unilateral primary osteoarthritis, right knee: Secondary | ICD-10-CM | POA: Diagnosis not present

## 2019-11-26 DIAGNOSIS — I700 Atherosclerosis of aorta: Secondary | ICD-10-CM | POA: Diagnosis not present

## 2019-11-26 DIAGNOSIS — F319 Bipolar disorder, unspecified: Secondary | ICD-10-CM | POA: Diagnosis not present

## 2019-11-26 DIAGNOSIS — I10 Essential (primary) hypertension: Secondary | ICD-10-CM | POA: Diagnosis not present

## 2019-11-26 DIAGNOSIS — I69351 Hemiplegia and hemiparesis following cerebral infarction affecting right dominant side: Secondary | ICD-10-CM | POA: Diagnosis not present

## 2019-11-26 DIAGNOSIS — J449 Chronic obstructive pulmonary disease, unspecified: Secondary | ICD-10-CM | POA: Diagnosis not present

## 2019-11-26 DIAGNOSIS — M1711 Unilateral primary osteoarthritis, right knee: Secondary | ICD-10-CM | POA: Diagnosis not present

## 2019-11-28 DIAGNOSIS — F319 Bipolar disorder, unspecified: Secondary | ICD-10-CM | POA: Diagnosis not present

## 2019-11-28 DIAGNOSIS — M1711 Unilateral primary osteoarthritis, right knee: Secondary | ICD-10-CM | POA: Diagnosis not present

## 2019-11-28 DIAGNOSIS — I700 Atherosclerosis of aorta: Secondary | ICD-10-CM | POA: Diagnosis not present

## 2019-11-28 DIAGNOSIS — I69351 Hemiplegia and hemiparesis following cerebral infarction affecting right dominant side: Secondary | ICD-10-CM | POA: Diagnosis not present

## 2019-11-28 DIAGNOSIS — I10 Essential (primary) hypertension: Secondary | ICD-10-CM | POA: Diagnosis not present

## 2019-11-28 DIAGNOSIS — J449 Chronic obstructive pulmonary disease, unspecified: Secondary | ICD-10-CM | POA: Diagnosis not present

## 2019-12-03 DIAGNOSIS — I69351 Hemiplegia and hemiparesis following cerebral infarction affecting right dominant side: Secondary | ICD-10-CM | POA: Diagnosis not present

## 2019-12-24 ENCOUNTER — Telehealth: Payer: Self-pay | Admitting: *Deleted

## 2019-12-24 NOTE — Telephone Encounter (Signed)
Received a fax from [facility].  Skin tear to right forearm, V shape, please refer to [facility] home health services for continued treatment.   Are you ok with this?

## 2019-12-24 NOTE — Telephone Encounter (Signed)
Please set up the referral. Thanks

## 2019-12-28 DIAGNOSIS — I10 Essential (primary) hypertension: Secondary | ICD-10-CM | POA: Diagnosis not present

## 2019-12-28 DIAGNOSIS — Z96651 Presence of right artificial knee joint: Secondary | ICD-10-CM | POA: Diagnosis not present

## 2019-12-28 DIAGNOSIS — I700 Atherosclerosis of aorta: Secondary | ICD-10-CM | POA: Diagnosis not present

## 2019-12-28 DIAGNOSIS — S51811D Laceration without foreign body of right forearm, subsequent encounter: Secondary | ICD-10-CM | POA: Diagnosis not present

## 2019-12-28 DIAGNOSIS — I69351 Hemiplegia and hemiparesis following cerebral infarction affecting right dominant side: Secondary | ICD-10-CM | POA: Diagnosis not present

## 2019-12-28 DIAGNOSIS — J449 Chronic obstructive pulmonary disease, unspecified: Secondary | ICD-10-CM | POA: Diagnosis not present

## 2019-12-28 DIAGNOSIS — G4700 Insomnia, unspecified: Secondary | ICD-10-CM | POA: Diagnosis not present

## 2019-12-28 DIAGNOSIS — Z7982 Long term (current) use of aspirin: Secondary | ICD-10-CM | POA: Diagnosis not present

## 2019-12-28 DIAGNOSIS — Z4800 Encounter for change or removal of nonsurgical wound dressing: Secondary | ICD-10-CM | POA: Diagnosis not present

## 2020-01-01 DIAGNOSIS — J449 Chronic obstructive pulmonary disease, unspecified: Secondary | ICD-10-CM | POA: Diagnosis not present

## 2020-01-01 DIAGNOSIS — G4700 Insomnia, unspecified: Secondary | ICD-10-CM | POA: Diagnosis not present

## 2020-01-01 DIAGNOSIS — S51811D Laceration without foreign body of right forearm, subsequent encounter: Secondary | ICD-10-CM | POA: Diagnosis not present

## 2020-01-01 DIAGNOSIS — I10 Essential (primary) hypertension: Secondary | ICD-10-CM | POA: Diagnosis not present

## 2020-01-01 DIAGNOSIS — I69351 Hemiplegia and hemiparesis following cerebral infarction affecting right dominant side: Secondary | ICD-10-CM | POA: Diagnosis not present

## 2020-01-01 DIAGNOSIS — Z4800 Encounter for change or removal of nonsurgical wound dressing: Secondary | ICD-10-CM | POA: Diagnosis not present

## 2020-01-03 ENCOUNTER — Telehealth: Payer: Self-pay | Admitting: Family Medicine

## 2020-01-03 DIAGNOSIS — I10 Essential (primary) hypertension: Secondary | ICD-10-CM | POA: Diagnosis not present

## 2020-01-03 DIAGNOSIS — S51811D Laceration without foreign body of right forearm, subsequent encounter: Secondary | ICD-10-CM | POA: Diagnosis not present

## 2020-01-03 DIAGNOSIS — J449 Chronic obstructive pulmonary disease, unspecified: Secondary | ICD-10-CM | POA: Diagnosis not present

## 2020-01-03 DIAGNOSIS — Z4800 Encounter for change or removal of nonsurgical wound dressing: Secondary | ICD-10-CM | POA: Diagnosis not present

## 2020-01-03 DIAGNOSIS — I69351 Hemiplegia and hemiparesis following cerebral infarction affecting right dominant side: Secondary | ICD-10-CM | POA: Diagnosis not present

## 2020-01-03 DIAGNOSIS — G4700 Insomnia, unspecified: Secondary | ICD-10-CM | POA: Diagnosis not present

## 2020-01-03 NOTE — Telephone Encounter (Signed)
Caller : [facility]  Call Back # [phone]  Requesting a Face to Face with Dr Charlett Blake or Provider in regards to wound patient has.

## 2020-01-07 DIAGNOSIS — S51811D Laceration without foreign body of right forearm, subsequent encounter: Secondary | ICD-10-CM | POA: Diagnosis not present

## 2020-01-07 DIAGNOSIS — G4700 Insomnia, unspecified: Secondary | ICD-10-CM | POA: Diagnosis not present

## 2020-01-07 DIAGNOSIS — Z4800 Encounter for change or removal of nonsurgical wound dressing: Secondary | ICD-10-CM | POA: Diagnosis not present

## 2020-01-07 DIAGNOSIS — I69351 Hemiplegia and hemiparesis following cerebral infarction affecting right dominant side: Secondary | ICD-10-CM | POA: Diagnosis not present

## 2020-01-07 DIAGNOSIS — I10 Essential (primary) hypertension: Secondary | ICD-10-CM | POA: Diagnosis not present

## 2020-01-07 DIAGNOSIS — J449 Chronic obstructive pulmonary disease, unspecified: Secondary | ICD-10-CM | POA: Diagnosis not present

## 2020-01-07 NOTE — Telephone Encounter (Signed)
Left message to call back to schedule.  Advised that Dr. Charlett Blake is out of the office and that another provider would be happy to see her.

## 2020-01-08 ENCOUNTER — Encounter: Payer: Self-pay | Admitting: Family Medicine

## 2020-01-08 ENCOUNTER — Encounter: Payer: Medicare Other | Admitting: Family Medicine

## 2020-01-08 ENCOUNTER — Other Ambulatory Visit: Payer: Self-pay

## 2020-01-08 NOTE — Progress Notes (Signed)
Pt's DPOA unaware of appt. Will no charge and disregard encounter.

## 2020-01-10 ENCOUNTER — Other Ambulatory Visit: Payer: Self-pay

## 2020-01-10 ENCOUNTER — Encounter: Payer: Medicare Other | Admitting: Family Medicine

## 2020-01-10 DIAGNOSIS — S51811D Laceration without foreign body of right forearm, subsequent encounter: Secondary | ICD-10-CM | POA: Diagnosis not present

## 2020-01-10 DIAGNOSIS — J449 Chronic obstructive pulmonary disease, unspecified: Secondary | ICD-10-CM | POA: Diagnosis not present

## 2020-01-10 DIAGNOSIS — I69351 Hemiplegia and hemiparesis following cerebral infarction affecting right dominant side: Secondary | ICD-10-CM | POA: Diagnosis not present

## 2020-01-10 DIAGNOSIS — Z4800 Encounter for change or removal of nonsurgical wound dressing: Secondary | ICD-10-CM | POA: Diagnosis not present

## 2020-01-10 DIAGNOSIS — L089 Local infection of the skin and subcutaneous tissue, unspecified: Secondary | ICD-10-CM

## 2020-01-10 DIAGNOSIS — G4700 Insomnia, unspecified: Secondary | ICD-10-CM | POA: Diagnosis not present

## 2020-01-10 DIAGNOSIS — I10 Essential (primary) hypertension: Secondary | ICD-10-CM | POA: Diagnosis not present

## 2020-01-12 ENCOUNTER — Encounter: Payer: Self-pay | Admitting: Family Medicine

## 2020-01-12 NOTE — Progress Notes (Signed)
This encounter was created in error - please disregard.

## 2020-01-16 ENCOUNTER — Encounter: Payer: Self-pay | Admitting: Family

## 2020-01-16 ENCOUNTER — Other Ambulatory Visit: Payer: Self-pay

## 2020-01-16 ENCOUNTER — Ambulatory Visit (INDEPENDENT_AMBULATORY_CARE_PROVIDER_SITE_OTHER): Payer: Medicare Other | Admitting: Family

## 2020-01-16 DIAGNOSIS — L84 Corns and callosities: Secondary | ICD-10-CM

## 2020-01-16 DIAGNOSIS — S51811A Laceration without foreign body of right forearm, initial encounter: Secondary | ICD-10-CM | POA: Diagnosis not present

## 2020-01-16 NOTE — Progress Notes (Signed)
Virtual Visit via Video Note  I connected with [place] on 01/16/20 at 10:20 AM EDT by a video enabled telemedicine application and verified that I am speaking with the correct person using two identifiers.  Location: Patient: SNF Provider: office   I discussed the limitations of evaluation and management by telemedicine and the availability of in person appointments. The patient expressed understanding and agreed to proceed.  History of Present Illness:  Patient is an 86 yr old female who presents today with an RN from her skilled facility for a visit. She presents today for follow up of a skin tear on her right arm. RN notes that pt has assistance while showering but a few weeks ago she sustained a skin tear on the right forearm with transferring. They report that this is mostly healed.    They are also requesting that she be referred to a podiatrist for painful thickened toenails and calluses.   Past Medical History:  Diagnosis Date  . Anemia    h/o fibroids  . Bipolar disorder ([place])   . Depression    bipolar  . Eczema   . H/O stroke within last year 11/19/2018  . HTN (hypertension)   . Hypothyroidism   . Insomnia   . Osteoarthritis   . Osteoporosis   . Psoriasis      Social History   Socioeconomic History  . Marital status: Single    Spouse name: Not on file  . Number of children: 1  . Years of education: Not on file  . Highest education level: Not on file  Occupational History  . Occupation: retired    Fish farm manager: RETIRED  Tobacco Use  . Smoking status: Former Smoker    Quit date: 09/29/1985    Years since quitting: 34.3  . Smokeless tobacco: Never Used  Substance and Sexual Activity  . Alcohol use: No  . Drug use: No  . Sexual activity: Not Currently    Partners: Male  Other Topics Concern  . Not on file  Social History Narrative   Lives by herself   PT for knee----silver sneakers   Social Determinants of Health   Financial Resource Strain:   .  Difficulty of Paying Living Expenses:   Food Insecurity:   . Worried About Charity fundraiser in the Last Year:   . Arboriculturist in the Last Year:   Transportation Needs:   . Film/video editor (Medical):   Marland Kitchen Lack of Transportation (Non-Medical):   Physical Activity:   . Days of Exercise per Week:   . Minutes of Exercise per Session:   Stress:   . Feeling of Stress :   Social Connections:   . Frequency of Communication with Friends and Family:   . Frequency of Social Gatherings with Friends and Family:   . Attends Religious Services:   . Active Member of Clubs or Organizations:   . Attends Archivist Meetings:   Marland Kitchen Marital Status:   Intimate Partner Violence:   . Fear of Current or Ex-Partner:   . Emotionally Abused:   Marland Kitchen Physically Abused:   . Sexually Abused:     Past Surgical History:  Procedure Laterality Date  . ABDOMINAL HYSTERECTOMY  1982  . APPENDECTOMY  2003  . BREAST LUMPECTOMY  1947   left  . CATARACT EXTRACTION  2010  . EYE SURGERY    . HERNIA REPAIR    . KNEE ARTHROSCOPY  09/07/11   right knee  .  LOOP RECORDER INSERTION N/A 11/20/2018   Procedure: LOOP RECORDER INSERTION;  Surgeon: Evans Lance, MD;  Location: [facility];  Service: Cardiovascular;  Laterality: N/A;  . LOOP RECORDER INSERTION  11/20/2018   Procedure: LOOP RECORDER INSERTION;  Surgeon: Jerline Pain, MD;  Location: [facility];  Service: Cardiovascular;;  . NASAL SEPTUM SURGERY  1970's  . TEE WITHOUT CARDIOVERSION N/A 11/20/2018   Procedure: TRANSESOPHAGEAL ECHOCARDIOGRAM (TEE);  Surgeon: Jerline Pain, MD;  Location: [facility];  Service: Cardiovascular;  Laterality: N/A;  . TONSILLECTOMY  1970's  . TONSILLECTOMY    . TOTAL KNEE ARTHROPLASTY Right 03/04/2015   Procedure: RIGHT TOTAL KNEE ARTHROPLASTY;  Surgeon: Mcarthur Rossetti, MD;  Location: [facility];  Service: Orthopedics;  Laterality: Right;  . VENTRAL HERNIA REPAIR  2005    Family History  Problem  Relation Age of Onset  . Colon cancer Brother   . Lung cancer Father   . Prostate cancer Father   . Arthritis Father   . Cancer Father        lung, prostate  . Heart disease Brother   . Cancer Brother        lung  . Lung cancer Brother   . Aortic aneurysm Brother   . Cancer Other        colon    Allergies  Allergen Reactions  . Bupropion Hcl Rash    Current Outpatient Medications on File Prior to Visit  Medication Sig Dispense Refill  . amLODipine (NORVASC) 5 MG tablet Take 1 tablet (5 mg total) by mouth daily. 90 tablet 3  . ARIPiprazole (ABILIFY) 5 MG tablet Take 1 tablet (5 mg total) by mouth daily. 3 tablet 0  . aspirin EC 81 MG EC tablet Take 1 tablet (81 mg total) by mouth daily. 30 tablet 0  . atorvastatin (LIPITOR) 40 MG tablet Take 1 tablet (40 mg total) by mouth daily at 6 PM. 30 tablet 0  . cholecalciferol (VITAMIN D) 1000 UNITS tablet Take 3,000 Units by mouth daily.     . clopidogrel (PLAVIX) 75 MG tablet Take 1 tablet (75 mg total) by mouth daily. 30 tablet 0  . furosemide (LASIX) 20 MG tablet Take 1 tablet (20 mg total) by mouth daily. 30 tablet   . levothyroxine (SYNTHROID) 88 MCG tablet TAKE 1 TABLET BY MOUTH EVERY DAY 90 tablet 1  . Melatonin 5 MG TABS Take 3 tablets (15 mg total) by mouth at bedtime. 3 tablet 0  . multivitamin (PROSIGHT) TABS tablet Take 1 tablet by mouth daily. 30 each 0  . potassium chloride SA (K-DUR,KLOR-CON) 20 MEQ tablet Take 1 tablet (20 mEq total) by mouth daily.    Marland Kitchen senna-docusate (SENOKOT-S) 8.6-50 MG tablet Take 2 tablets by mouth 2 (two) times daily.    . Triamcinolone Acetonide (TRIAMCINOLONE 0.1 % CREAM : EUCERIN) CREA Apply 1 application topically daily as needed. 2 each 3  . triamcinolone cream (KENALOG) 0.1 % Apply 1 application topically daily as needed. 30 g 3   No current facility-administered medications on file prior to visit.    There were no vitals taken for this visit.   Observations/Objective:   Gen: Awake,  alert, no acute distress Resp: Breathing is even and non-labored Psych: calm/pleasant demeanor Neuro: Alert and Oriented x 3, + facial symmetry, speech is clear. Skin: healed skin tear noted on right forearm  Assessment and Plan:  Skin tear- resolved.  No sign of infection.  Foot calluses- form faxed to  facility authorizing podiatry referral.    Follow Up Instructions:    I discussed the assessment and treatment plan with the patient. The patient was provided an opportunity to ask questions and all were answered. The patient agreed with the plan and demonstrated an understanding of the instructions.   The patient was advised to call back or seek an in-person evaluation if the symptoms worsen or if the condition fails to improve as anticipated.  Nance Pear, NP

## 2020-02-04 DIAGNOSIS — Q845 Enlarged and hypertrophic nails: Secondary | ICD-10-CM | POA: Diagnosis not present

## 2020-02-04 DIAGNOSIS — I739 Peripheral vascular disease, unspecified: Secondary | ICD-10-CM | POA: Diagnosis not present

## 2020-02-04 DIAGNOSIS — B351 Tinea unguium: Secondary | ICD-10-CM | POA: Diagnosis not present

## 2020-02-04 DIAGNOSIS — L603 Nail dystrophy: Secondary | ICD-10-CM | POA: Diagnosis not present

## 2020-03-06 ENCOUNTER — Telehealth: Payer: Self-pay | Admitting: Family Medicine

## 2020-03-06 NOTE — Telephone Encounter (Signed)
Can someone bring the sample here or do they have an arrangement with a lab such as [facility] or [facility] to pick up?

## 2020-03-06 NOTE — Telephone Encounter (Signed)
Caller: [name] [name]  Call Back [phone]  Patient  Is in assisted living , patient believed to have a urinary tract infection.  Assisted living made provider aware of patient symptoms, patient was give supplies to collect sample however facility doesnt not have ability to test sample.   Please Advise

## 2020-03-07 NOTE — Telephone Encounter (Signed)
No lab appointment at our office. Patient would like to be seen Monday. Scheduled her to see Percell Miller Monday at 2:20.

## 2020-03-10 ENCOUNTER — Telehealth: Payer: Self-pay

## 2020-03-10 ENCOUNTER — Ambulatory Visit: Payer: Medicare Other | Admitting: Medical

## 2020-03-12 ENCOUNTER — Other Ambulatory Visit: Payer: Self-pay

## 2020-03-12 ENCOUNTER — Encounter: Payer: Self-pay | Admitting: Medical

## 2020-03-12 ENCOUNTER — Ambulatory Visit: Payer: Medicare Other | Admitting: Medical

## 2020-03-12 ENCOUNTER — Ambulatory Visit (HOSPITAL_BASED_OUTPATIENT_CLINIC_OR_DEPARTMENT_OTHER)
Admission: RE | Admit: 2020-03-12 | Discharge: 2020-03-12 | Disposition: A | Discharge status: Home / Self Care | Payer: Medicare Other | Source: Ambulatory Visit | Attending: Medical | Admitting: Medical

## 2020-03-12 ENCOUNTER — Ambulatory Visit (INDEPENDENT_AMBULATORY_CARE_PROVIDER_SITE_OTHER): Payer: Medicare Other | Admitting: Medical

## 2020-03-12 VITALS — BP 114/60 | HR 83 | Resp 18 | Ht 65.0 in | Wt 150.0 lb

## 2020-03-12 DIAGNOSIS — R82998 Other abnormal findings in urine: Secondary | ICD-10-CM

## 2020-03-12 DIAGNOSIS — R600 Localized edema: Secondary | ICD-10-CM | POA: Insufficient documentation

## 2020-03-12 DIAGNOSIS — R300 Dysuria: Secondary | ICD-10-CM

## 2020-03-12 DIAGNOSIS — N393 Stress incontinence (female) (male): Secondary | ICD-10-CM

## 2020-03-12 DIAGNOSIS — R918 Other nonspecific abnormal finding of lung field: Secondary | ICD-10-CM | POA: Diagnosis not present

## 2020-03-12 LAB — POC URINALSYSI DIPSTICK (AUTOMATED)
Bilirubin, UA: NEGATIVE
Blood, UA: POSITIVE
Glucose, UA: NEGATIVE
Ketones, UA: NEGATIVE
Nitrite, UA: NEGATIVE
Protein, UA: NEGATIVE
Spec Grav, UA: 1.025 (ref 1.010–1.025)
Urobilinogen, UA: 0.2 E.U./dL
pH, UA: 6 (ref 5.0–8.0)

## 2020-03-12 MED ORDER — CIPROFLOXACIN HCL 250 MG PO TABS: 250.0000 mg | ORAL_TABLET | Freq: Two times a day (BID) | ORAL | 0 refills | Status: DC

## 2020-03-12 MED FILL — CIPROFLOXACIN HCL 250 MG TA: 250 | 3 days supply | Qty: 6 | Fill #0

## 2020-03-12 NOTE — Progress Notes (Signed)
Subjective:    Patient ID: [name] [name], female    DOB: Nov 03, 1932, 86 y.o.   MRN: [id number]  HPI  Pt in for possible uti. She has history of incontinence that started 2 weeks ago per pt. She can't walk. She is in diaper. She live at [facility]. Pt has hx of stroke. Son and daughter in law could not visit for months last year. Stayed in wheel chair and legs got week.    The CNA that try to help her can help her stand.   No fever, no chills, no sweats or body aches.     Pt about one week ago got swelling of both legs. No sob, no orthopnea reported.    Review of Systems  Constitutional: Negative for chills, fatigue and fever.  HENT: Negative for congestion, ear pain, mouth sores, nosebleeds, postnasal drip, rhinorrhea, sinus pressure, sore throat, tinnitus and trouble swallowing.   Respiratory: Negative for cough, chest tightness, shortness of breath and wheezing.   Cardiovascular: Negative for chest pain and palpitations.  Gastrointestinal: Negative for abdominal distention, abdominal pain, constipation, diarrhea, nausea and vomiting.  Genitourinary: Negative for dyspareunia, flank pain and frequency.  Musculoskeletal: Negative for back pain, myalgias and neck stiffness.  Skin: Negative for rash.  Neurological: Negative for dizziness, seizures, facial asymmetry, weakness, light-headedness, numbness and headaches.  Hematological: Negative for adenopathy. Does not bruise/bleed easily.  Psychiatric/Behavioral: Negative for behavioral problems, decreased concentration, dysphoric mood, sleep disturbance and suicidal ideas. The patient is not nervous/anxious.    Past Medical History:  Diagnosis Date  . Anemia    h/o fibroids  . Bipolar disorder ([place])   . Depression    bipolar  . Eczema   . H/O stroke within last year 11/19/2018  . HTN (hypertension)   . Hypothyroidism   . Insomnia   . Osteoarthritis   . Osteoporosis   . Psoriasis      Social History   Socioeconomic  History  . Marital status: Single    Spouse name: Not on file  . Number of children: 1  . Years of education: Not on file  . Highest education level: Not on file  Occupational History  . Occupation: retired    Fish farm manager: RETIRED  Tobacco Use  . Smoking status: Former Smoker    Quit date: 09/29/1985    Years since quitting: 34.4  . Smokeless tobacco: Never Used  Substance and Sexual Activity  . Alcohol use: No  . Drug use: No  . Sexual activity: Not Currently    Partners: Male  Other Topics Concern  . Not on file  Social History Narrative   Lives by herself   PT for knee----silver sneakers   Social Determinants of Health   Financial Resource Strain:   . Difficulty of Paying Living Expenses:   Food Insecurity:   . Worried About Charity fundraiser in the Last Year:   . Arboriculturist in the Last Year:   Transportation Needs:   . Film/video editor (Medical):   Marland Kitchen Lack of Transportation (Non-Medical):   Physical Activity:   . Days of Exercise per Week:   . Minutes of Exercise per Session:   Stress:   . Feeling of Stress :   Social Connections:   . Frequency of Communication with Friends and Family:   . Frequency of Social Gatherings with Friends and Family:   . Attends Religious Services:   . Active Member of Clubs or Organizations:   . Attends  Club or Organization Meetings:   Marland Kitchen Marital Status:   Intimate Partner Violence:   . Fear of Current or Ex-Partner:   . Emotionally Abused:   Marland Kitchen Physically Abused:   . Sexually Abused:     Past Surgical History:  Procedure Laterality Date  . ABDOMINAL HYSTERECTOMY  1982  . APPENDECTOMY  2003  . BREAST LUMPECTOMY  1947   left  . CATARACT EXTRACTION  2010  . EYE SURGERY    . HERNIA REPAIR    . KNEE ARTHROSCOPY  09/07/11   right knee  . LOOP RECORDER INSERTION N/A 11/20/2018   Procedure: LOOP RECORDER INSERTION;  Surgeon: Evans Lance, MD;  Location: [facility];  Service: Cardiovascular;  Laterality: N/A;   . LOOP RECORDER INSERTION  11/20/2018   Procedure: LOOP RECORDER INSERTION;  Surgeon: Jerline Pain, MD;  Location: [facility];  Service: Cardiovascular;;  . NASAL SEPTUM SURGERY  1970's  . TEE WITHOUT CARDIOVERSION N/A 11/20/2018   Procedure: TRANSESOPHAGEAL ECHOCARDIOGRAM (TEE);  Surgeon: Jerline Pain, MD;  Location: [facility];  Service: Cardiovascular;  Laterality: N/A;  . TONSILLECTOMY  1970's  . TONSILLECTOMY    . TOTAL KNEE ARTHROPLASTY Right 03/04/2015   Procedure: RIGHT TOTAL KNEE ARTHROPLASTY;  Surgeon: Mcarthur Rossetti, MD;  Location: [facility];  Service: Orthopedics;  Laterality: Right;  . VENTRAL HERNIA REPAIR  2005    Family History  Problem Relation Age of Onset  . Colon cancer Brother   . Lung cancer Father   . Prostate cancer Father   . Arthritis Father   . Cancer Father        lung, prostate  . Heart disease Brother   . Cancer Brother        lung  . Lung cancer Brother   . Aortic aneurysm Brother   . Cancer Other        colon    Allergies  Allergen Reactions  . Bupropion Hcl Rash    Current Outpatient Medications on File Prior to Visit  Medication Sig Dispense Refill  . amLODipine (NORVASC) 5 MG tablet Take 1 tablet (5 mg total) by mouth daily. 90 tablet 3  . ARIPiprazole (ABILIFY) 5 MG tablet Take 1 tablet (5 mg total) by mouth daily. 3 tablet 0  . aspirin EC 81 MG EC tablet Take 1 tablet (81 mg total) by mouth daily. 30 tablet 0  . atorvastatin (LIPITOR) 40 MG tablet Take 1 tablet (40 mg total) by mouth daily at 6 PM. 30 tablet 0  . cholecalciferol (VITAMIN D) 1000 UNITS tablet Take 3,000 Units by mouth daily.     . clopidogrel (PLAVIX) 75 MG tablet Take 1 tablet (75 mg total) by mouth daily. 30 tablet 0  . furosemide (LASIX) 20 MG tablet Take 1 tablet (20 mg total) by mouth daily. 30 tablet   . levothyroxine (SYNTHROID) 88 MCG tablet TAKE 1 TABLET BY MOUTH EVERY DAY 90 tablet 1  . Melatonin 5 MG TABS Take 3 tablets (15 mg total) by mouth at  bedtime. 3 tablet 0  . multivitamin (PROSIGHT) TABS tablet Take 1 tablet by mouth daily. 30 each 0  . potassium chloride SA (K-DUR,KLOR-CON) 20 MEQ tablet Take 1 tablet (20 mEq total) by mouth daily.    Marland Kitchen senna-docusate (SENOKOT-S) 8.6-50 MG tablet Take 2 tablets by mouth 2 (two) times daily.    . Triamcinolone Acetonide (TRIAMCINOLONE 0.1 % CREAM : EUCERIN) CREA Apply 1 application topically daily as needed. 2 each 3  .  triamcinolone cream (KENALOG) 0.1 % Apply 1 application topically daily as needed. 30 g 3   No current facility-administered medications on file prior to visit.    BP 114/60 (BP Location: Left Arm, Patient Position: Sitting, Cuff Size: Large)   Pulse 83   Resp 18   Ht 5\' 5"  (1.651 m)   Wt 150 lb (68 kg)   SpO2 97%   BMI 24.96 kg/m       Objective:   Physical Exam  General- No acute distress. Pleasant patient. Neck- Full range of motion, no jvd Lungs- Clear, even and unlabored. Heart- regular rate and rhythm. Neurologic- CNII- XII grossly intact.  Lower ext- symmetric 1+ pedal edema. Negative homans signs. No warmth.  Abdomen- soft, nt, nd, +bs, no rebound or guarding. No suprapubic tenderness.  Back- no cva tenderness.        Assessment & Plan:  You have recent urge to urinate 4-5 times a day and  Incontinence. May represent uti. We got urine for culture. Short 3 day cipro pending culture result.  For bilateral lower ext symmetric edema will get cmp, bnp and chest xray. If any asymmetric swelling let us know. In that event would get ultrasound.  Follow up as scheduled for virtual visit with Dr. Charlett Blake or as needed  Mackie Pai, PA-C   Time spent with patient today was 30  minutes which consisted of chart review, discussing diagnosis, work up, treatment and documentation.

## 2020-03-12 NOTE — Patient Instructions (Addendum)
You have recent urge to urinate 4-5 times a day and  Incontinence. May represent uti. We got urine for culture. Short 3 day cipro pending culture result.   For bilateral lower ext symmetric edema will get cmp, bnp and chest xray. If any asymmetric swelling let us know. In that event would get ultrasound.  Follow up as scheduled for virtual visit with Dr. Charlett Blake or as needed

## 2020-03-13 LAB — URINE CULTURE
MICRO NUMBER:: 10522422
SPECIMEN QUALITY:: ADEQUATE

## 2020-03-13 LAB — COMPREHENSIVE METABOLIC PANEL
ALT: 16 U/L (ref 0–35)
AST: 17 U/L (ref 0–37)
Albumin: 4.2 g/dL (ref 3.5–5.2)
Alkaline Phosphatase: 30 U/L — ABNORMAL LOW (ref 39–117)
BUN: 17 mg/dL (ref 6–23)
CO2: 29 mEq/L (ref 19–32)
Calcium: 9.5 mg/dL (ref 8.4–10.5)
Chloride: 102 mEq/L (ref 96–112)
Creatinine, Ser: 1.02 mg/dL (ref 0.40–1.20)
GFR: 51.54 mL/min — ABNORMAL LOW (ref >60.00)
Glucose, Bld: 96 mg/dL (ref 70–99)
Potassium: 3.6 mEq/L (ref 3.5–5.1)
Sodium: 140 mEq/L (ref 135–145)
Total Bilirubin: 0.4 mg/dL (ref 0.2–1.2)
Total Protein: 6.9 g/dL (ref 6.0–8.3)

## 2020-03-13 LAB — BRAIN NATRIURETIC PEPTIDE: Pro B Natriuretic peptide (BNP): 66 pg/mL (ref 0.0–100.0)

## 2020-03-25 ENCOUNTER — Telehealth: Payer: Medicare Other | Admitting: Family Medicine

## 2020-03-25 ENCOUNTER — Other Ambulatory Visit: Payer: Self-pay

## 2020-04-30 ENCOUNTER — Telehealth: Payer: Self-pay | Admitting: Family Medicine

## 2020-04-30 NOTE — Telephone Encounter (Signed)
[place]  Call Back # [phone]    Requesting PT for patient with help transferring.

## 2020-04-30 NOTE — Telephone Encounter (Signed)
Awaiting other phone note before calling back.

## 2020-05-01 NOTE — Telephone Encounter (Signed)
Patient has an video appointment with you tomorrow.  She is at [employer] and per Angie she has functionally declined, she does not want to do anything for herself.  To help with cost and insurance purposes we will need to put in your note these things below and use a diagnosis of stroke (which she has had in the past, do not use symptoms):  -Need more help with transfer -Needs help with propelling wheelchair -Mobility has declined

## 2020-05-02 ENCOUNTER — Other Ambulatory Visit: Payer: Self-pay

## 2020-05-02 ENCOUNTER — Telehealth (INDEPENDENT_AMBULATORY_CARE_PROVIDER_SITE_OTHER): Payer: Medicare Other | Admitting: Medical

## 2020-05-02 DIAGNOSIS — Z8673 Personal history of transient ischemic attack (TIA), and cerebral infarction without residual deficits: Secondary | ICD-10-CM

## 2020-05-02 DIAGNOSIS — R2681 Unsteadiness on feet: Secondary | ICD-10-CM | POA: Diagnosis not present

## 2020-05-02 DIAGNOSIS — R531 Weakness: Secondary | ICD-10-CM

## 2020-05-02 DIAGNOSIS — R29898 Other symptoms and signs involving the musculoskeletal system: Secondary | ICD-10-CM | POA: Diagnosis not present

## 2020-05-02 NOTE — Progress Notes (Signed)
   Subjective:    Patient ID: [name] [name], female    DOB: Dec 14, 1932, 86 y.o.   MRN: [id number]  HPI  Virtual Visit via Video Note  I connected with [name] [name] on 05/02/20 at  1:00 PM EDT by a video enabled telemedicine application and verified that I am speaking with the correct person using two identifiers.  Location: Patient: [facility]  home Provider: office   I discussed the limitations of evaluation and management by telemedicine and the availability of in person appointments. The patient expressed understanding and agreed to proceed.  Pt had vitals checked.   History of Present Illness:   Pt has history of stroke in past. Pt has been in [facility] for one year. Pt after stroke could walk with walker and had improved rt hand/upper ext deficits. Was able to self propel in wheel chair as well as stand and transfer Pt was alone during pandemic and family was not able to visit due to restriction.  Appears she became deconditioned and just sat in wheel chair. Now overall deconditioned. Has difficult standing and transferring/appears unstable. Weakness generalized upper and lower ext day to day basis.    Discussed with son and [place] staffe  PT referral to  work  arm weakness for self propel with wheel chair, transfer, gait instability/fall prevention.   [phone].(Attention to [employer]).   Observations/Objective: General-no acute distress, pleasant, oriented. Lungs- on inspection lungs appear unlabored. Neck- no tracheal deviation or jvd on inspection. Neuro- gross motor function appears intact.  Has good upper extremity range of motion.  Thumb to finger tip coordination intact bilaterally.  Lower extremities she has full flexion and extension of the knees.  Follow-up however on attempting to stand up she has obvious difficulty and weakness.  Appears unstable.  Assessment and Plan:   Follow Up Instructions:    I discussed the assessment and treatment  plan with the patient. The patient was provided an opportunity to ask questions and all were answered. The patient agreed with the plan and demonstrated an understanding of the instructions.   The patient was advised to call back or seek an in-person evaluation if the symptoms worsen or if the condition fails to improve as anticipated.  Time spent with patient today was   minutes which consisted of chart revdiew, discussing diagnosis, work up treatment and documentation.   Mackie Pai, PA-C     Review of Systems  Constitutional: Negative for chills, diaphoresis and fever.  Respiratory: Negative for cough, chest tightness, shortness of breath and wheezing.   Cardiovascular: Negative for chest pain and palpitations.  Gastrointestinal: Negative for abdominal pain.  Genitourinary: Negative for difficulty urinating, dysuria, flank pain and frequency.  Musculoskeletal:       See HPI.  Neurological: Negative for dizziness.  Psychiatric/Behavioral: Negative for behavioral problems and confusion.       Objective:   Physical Exam        Assessment & Plan:  For history of stroke, deconditioning, upper extremity weakness, gait instability and difficulty transferring, I did go ahead and put in referral to PT.  Going to fax over order to [facility] attention to Dentist.  Asked Earlie Server get vital signs fax over to our office today.   Time spent with patient today was  35 minutes which consisted of chart review, discussing diagnosis, functional decline, assessing function by video  and documentation. Follow-up as regular scheduled with PCP or as needed.

## 2020-05-02 NOTE — Patient Instructions (Signed)
For history of stroke, deconditioning, upper extremity weakness, gait instability and difficulty transferring, I did go ahead and put in referral to PT.  Going to fax over order to [facility] attention to Dentist.  Asked Earlie Server get vital signs fax over to our office today.  Follow-up as regular scheduled with PCP or as needed.

## 2020-05-06 NOTE — Telephone Encounter (Signed)
Wells Guiles from [facility] stated that they never received the PT referral paper.  Can you do another and give it to me so I can fax it.    We did received confirmation that it went thru.  Not sure why they did not get it.

## 2020-05-07 DIAGNOSIS — M1990 Unspecified osteoarthritis, unspecified site: Secondary | ICD-10-CM | POA: Diagnosis not present

## 2020-05-07 DIAGNOSIS — I69351 Hemiplegia and hemiparesis following cerebral infarction affecting right dominant side: Secondary | ICD-10-CM | POA: Diagnosis not present

## 2020-05-07 DIAGNOSIS — Z9181 History of falling: Secondary | ICD-10-CM | POA: Diagnosis not present

## 2020-05-07 DIAGNOSIS — F319 Bipolar disorder, unspecified: Secondary | ICD-10-CM | POA: Diagnosis not present

## 2020-05-07 DIAGNOSIS — Z96651 Presence of right artificial knee joint: Secondary | ICD-10-CM | POA: Diagnosis not present

## 2020-05-07 DIAGNOSIS — Z7982 Long term (current) use of aspirin: Secondary | ICD-10-CM | POA: Diagnosis not present

## 2020-05-07 DIAGNOSIS — M810 Age-related osteoporosis without current pathological fracture: Secondary | ICD-10-CM | POA: Diagnosis not present

## 2020-05-07 DIAGNOSIS — E039 Hypothyroidism, unspecified: Secondary | ICD-10-CM | POA: Diagnosis not present

## 2020-05-07 DIAGNOSIS — I10 Essential (primary) hypertension: Secondary | ICD-10-CM | POA: Diagnosis not present

## 2020-05-07 DIAGNOSIS — H3530 Unspecified macular degeneration: Secondary | ICD-10-CM | POA: Diagnosis not present

## 2020-05-07 DIAGNOSIS — I700 Atherosclerosis of aorta: Secondary | ICD-10-CM | POA: Diagnosis not present

## 2020-05-08 NOTE — Telephone Encounter (Signed)
Placed PT on script again. Please fax over and make copy/save since this is second time. Thanks.

## 2020-05-08 NOTE — Telephone Encounter (Signed)
PT referral faxed.

## 2020-05-13 DIAGNOSIS — M1990 Unspecified osteoarthritis, unspecified site: Secondary | ICD-10-CM | POA: Diagnosis not present

## 2020-05-13 DIAGNOSIS — M810 Age-related osteoporosis without current pathological fracture: Secondary | ICD-10-CM | POA: Diagnosis not present

## 2020-05-13 DIAGNOSIS — I700 Atherosclerosis of aorta: Secondary | ICD-10-CM | POA: Diagnosis not present

## 2020-05-13 DIAGNOSIS — I10 Essential (primary) hypertension: Secondary | ICD-10-CM | POA: Diagnosis not present

## 2020-05-13 DIAGNOSIS — I69351 Hemiplegia and hemiparesis following cerebral infarction affecting right dominant side: Secondary | ICD-10-CM | POA: Diagnosis not present

## 2020-05-13 DIAGNOSIS — F319 Bipolar disorder, unspecified: Secondary | ICD-10-CM | POA: Diagnosis not present

## 2020-05-15 DIAGNOSIS — I10 Essential (primary) hypertension: Secondary | ICD-10-CM | POA: Diagnosis not present

## 2020-05-15 DIAGNOSIS — M1990 Unspecified osteoarthritis, unspecified site: Secondary | ICD-10-CM | POA: Diagnosis not present

## 2020-05-15 DIAGNOSIS — I69351 Hemiplegia and hemiparesis following cerebral infarction affecting right dominant side: Secondary | ICD-10-CM | POA: Diagnosis not present

## 2020-05-15 DIAGNOSIS — F319 Bipolar disorder, unspecified: Secondary | ICD-10-CM | POA: Diagnosis not present

## 2020-05-15 DIAGNOSIS — I700 Atherosclerosis of aorta: Secondary | ICD-10-CM | POA: Diagnosis not present

## 2020-05-15 DIAGNOSIS — M810 Age-related osteoporosis without current pathological fracture: Secondary | ICD-10-CM | POA: Diagnosis not present

## 2020-05-20 DIAGNOSIS — F319 Bipolar disorder, unspecified: Secondary | ICD-10-CM | POA: Diagnosis not present

## 2020-05-20 DIAGNOSIS — M810 Age-related osteoporosis without current pathological fracture: Secondary | ICD-10-CM | POA: Diagnosis not present

## 2020-05-20 DIAGNOSIS — I10 Essential (primary) hypertension: Secondary | ICD-10-CM | POA: Diagnosis not present

## 2020-05-20 DIAGNOSIS — I69351 Hemiplegia and hemiparesis following cerebral infarction affecting right dominant side: Secondary | ICD-10-CM | POA: Diagnosis not present

## 2020-05-20 DIAGNOSIS — I700 Atherosclerosis of aorta: Secondary | ICD-10-CM | POA: Diagnosis not present

## 2020-05-20 DIAGNOSIS — M1990 Unspecified osteoarthritis, unspecified site: Secondary | ICD-10-CM | POA: Diagnosis not present

## 2020-05-22 DIAGNOSIS — I10 Essential (primary) hypertension: Secondary | ICD-10-CM | POA: Diagnosis not present

## 2020-05-22 DIAGNOSIS — I700 Atherosclerosis of aorta: Secondary | ICD-10-CM | POA: Diagnosis not present

## 2020-05-22 DIAGNOSIS — F319 Bipolar disorder, unspecified: Secondary | ICD-10-CM | POA: Diagnosis not present

## 2020-05-22 DIAGNOSIS — M810 Age-related osteoporosis without current pathological fracture: Secondary | ICD-10-CM | POA: Diagnosis not present

## 2020-05-22 DIAGNOSIS — I69351 Hemiplegia and hemiparesis following cerebral infarction affecting right dominant side: Secondary | ICD-10-CM | POA: Diagnosis not present

## 2020-05-22 DIAGNOSIS — M1990 Unspecified osteoarthritis, unspecified site: Secondary | ICD-10-CM | POA: Diagnosis not present

## 2020-05-26 DIAGNOSIS — I69351 Hemiplegia and hemiparesis following cerebral infarction affecting right dominant side: Secondary | ICD-10-CM | POA: Diagnosis not present

## 2020-05-26 DIAGNOSIS — M810 Age-related osteoporosis without current pathological fracture: Secondary | ICD-10-CM | POA: Diagnosis not present

## 2020-05-26 DIAGNOSIS — M1990 Unspecified osteoarthritis, unspecified site: Secondary | ICD-10-CM | POA: Diagnosis not present

## 2020-05-26 DIAGNOSIS — F319 Bipolar disorder, unspecified: Secondary | ICD-10-CM | POA: Diagnosis not present

## 2020-05-26 DIAGNOSIS — I10 Essential (primary) hypertension: Secondary | ICD-10-CM | POA: Diagnosis not present

## 2020-05-26 DIAGNOSIS — I700 Atherosclerosis of aorta: Secondary | ICD-10-CM | POA: Diagnosis not present

## 2020-05-28 DIAGNOSIS — I10 Essential (primary) hypertension: Secondary | ICD-10-CM | POA: Diagnosis not present

## 2020-05-28 DIAGNOSIS — M810 Age-related osteoporosis without current pathological fracture: Secondary | ICD-10-CM | POA: Diagnosis not present

## 2020-05-28 DIAGNOSIS — F319 Bipolar disorder, unspecified: Secondary | ICD-10-CM | POA: Diagnosis not present

## 2020-05-28 DIAGNOSIS — I69351 Hemiplegia and hemiparesis following cerebral infarction affecting right dominant side: Secondary | ICD-10-CM | POA: Diagnosis not present

## 2020-05-28 DIAGNOSIS — M1990 Unspecified osteoarthritis, unspecified site: Secondary | ICD-10-CM | POA: Diagnosis not present

## 2020-05-28 DIAGNOSIS — I700 Atherosclerosis of aorta: Secondary | ICD-10-CM | POA: Diagnosis not present

## 2020-05-30 DIAGNOSIS — E161 Other hypoglycemia: Secondary | ICD-10-CM | POA: Diagnosis not present

## 2020-05-30 DIAGNOSIS — E162 Hypoglycemia, unspecified: Secondary | ICD-10-CM | POA: Diagnosis not present

## 2020-05-30 DIAGNOSIS — R4781 Slurred speech: Secondary | ICD-10-CM | POA: Diagnosis not present

## 2020-05-30 DIAGNOSIS — I10 Essential (primary) hypertension: Secondary | ICD-10-CM | POA: Diagnosis not present

## 2020-06-02 DIAGNOSIS — I10 Essential (primary) hypertension: Secondary | ICD-10-CM | POA: Diagnosis not present

## 2020-06-02 DIAGNOSIS — M810 Age-related osteoporosis without current pathological fracture: Secondary | ICD-10-CM | POA: Diagnosis not present

## 2020-06-02 DIAGNOSIS — M1990 Unspecified osteoarthritis, unspecified site: Secondary | ICD-10-CM | POA: Diagnosis not present

## 2020-06-02 DIAGNOSIS — I700 Atherosclerosis of aorta: Secondary | ICD-10-CM | POA: Diagnosis not present

## 2020-06-02 DIAGNOSIS — F319 Bipolar disorder, unspecified: Secondary | ICD-10-CM | POA: Diagnosis not present

## 2020-06-02 DIAGNOSIS — I69351 Hemiplegia and hemiparesis following cerebral infarction affecting right dominant side: Secondary | ICD-10-CM | POA: Diagnosis not present

## 2020-06-05 DIAGNOSIS — M810 Age-related osteoporosis without current pathological fracture: Secondary | ICD-10-CM | POA: Diagnosis not present

## 2020-06-05 DIAGNOSIS — M1990 Unspecified osteoarthritis, unspecified site: Secondary | ICD-10-CM | POA: Diagnosis not present

## 2020-06-05 DIAGNOSIS — I69351 Hemiplegia and hemiparesis following cerebral infarction affecting right dominant side: Secondary | ICD-10-CM | POA: Diagnosis not present

## 2020-06-05 DIAGNOSIS — I700 Atherosclerosis of aorta: Secondary | ICD-10-CM | POA: Diagnosis not present

## 2020-06-05 DIAGNOSIS — I10 Essential (primary) hypertension: Secondary | ICD-10-CM | POA: Diagnosis not present

## 2020-06-05 DIAGNOSIS — F319 Bipolar disorder, unspecified: Secondary | ICD-10-CM | POA: Diagnosis not present

## 2020-06-06 DIAGNOSIS — I10 Essential (primary) hypertension: Secondary | ICD-10-CM | POA: Diagnosis not present

## 2020-06-06 DIAGNOSIS — I700 Atherosclerosis of aorta: Secondary | ICD-10-CM | POA: Diagnosis not present

## 2020-06-06 DIAGNOSIS — M810 Age-related osteoporosis without current pathological fracture: Secondary | ICD-10-CM | POA: Diagnosis not present

## 2020-06-06 DIAGNOSIS — Z7982 Long term (current) use of aspirin: Secondary | ICD-10-CM | POA: Diagnosis not present

## 2020-06-06 DIAGNOSIS — Z96651 Presence of right artificial knee joint: Secondary | ICD-10-CM | POA: Diagnosis not present

## 2020-06-06 DIAGNOSIS — M1990 Unspecified osteoarthritis, unspecified site: Secondary | ICD-10-CM | POA: Diagnosis not present

## 2020-06-06 DIAGNOSIS — E039 Hypothyroidism, unspecified: Secondary | ICD-10-CM | POA: Diagnosis not present

## 2020-06-06 DIAGNOSIS — Z9181 History of falling: Secondary | ICD-10-CM | POA: Diagnosis not present

## 2020-06-06 DIAGNOSIS — I69351 Hemiplegia and hemiparesis following cerebral infarction affecting right dominant side: Secondary | ICD-10-CM | POA: Diagnosis not present

## 2020-06-06 DIAGNOSIS — H3530 Unspecified macular degeneration: Secondary | ICD-10-CM | POA: Diagnosis not present

## 2020-06-06 DIAGNOSIS — F319 Bipolar disorder, unspecified: Secondary | ICD-10-CM | POA: Diagnosis not present

## 2020-06-10 DIAGNOSIS — I69351 Hemiplegia and hemiparesis following cerebral infarction affecting right dominant side: Secondary | ICD-10-CM | POA: Diagnosis not present

## 2020-06-10 DIAGNOSIS — M1990 Unspecified osteoarthritis, unspecified site: Secondary | ICD-10-CM | POA: Diagnosis not present

## 2020-06-10 DIAGNOSIS — I10 Essential (primary) hypertension: Secondary | ICD-10-CM | POA: Diagnosis not present

## 2020-06-10 DIAGNOSIS — M810 Age-related osteoporosis without current pathological fracture: Secondary | ICD-10-CM | POA: Diagnosis not present

## 2020-06-10 DIAGNOSIS — I700 Atherosclerosis of aorta: Secondary | ICD-10-CM | POA: Diagnosis not present

## 2020-06-10 DIAGNOSIS — F319 Bipolar disorder, unspecified: Secondary | ICD-10-CM | POA: Diagnosis not present

## 2020-06-12 DIAGNOSIS — I69351 Hemiplegia and hemiparesis following cerebral infarction affecting right dominant side: Secondary | ICD-10-CM | POA: Diagnosis not present

## 2020-06-12 DIAGNOSIS — M810 Age-related osteoporosis without current pathological fracture: Secondary | ICD-10-CM | POA: Diagnosis not present

## 2020-06-12 DIAGNOSIS — M1990 Unspecified osteoarthritis, unspecified site: Secondary | ICD-10-CM | POA: Diagnosis not present

## 2020-06-12 DIAGNOSIS — I700 Atherosclerosis of aorta: Secondary | ICD-10-CM | POA: Diagnosis not present

## 2020-06-12 DIAGNOSIS — I10 Essential (primary) hypertension: Secondary | ICD-10-CM | POA: Diagnosis not present

## 2020-06-12 DIAGNOSIS — F319 Bipolar disorder, unspecified: Secondary | ICD-10-CM | POA: Diagnosis not present

## 2020-06-17 DIAGNOSIS — I10 Essential (primary) hypertension: Secondary | ICD-10-CM | POA: Diagnosis not present

## 2020-06-17 DIAGNOSIS — M810 Age-related osteoporosis without current pathological fracture: Secondary | ICD-10-CM | POA: Diagnosis not present

## 2020-06-17 DIAGNOSIS — F319 Bipolar disorder, unspecified: Secondary | ICD-10-CM | POA: Diagnosis not present

## 2020-06-17 DIAGNOSIS — I69351 Hemiplegia and hemiparesis following cerebral infarction affecting right dominant side: Secondary | ICD-10-CM | POA: Diagnosis not present

## 2020-06-17 DIAGNOSIS — Q845 Enlarged and hypertrophic nails: Secondary | ICD-10-CM | POA: Diagnosis not present

## 2020-06-17 DIAGNOSIS — M1990 Unspecified osteoarthritis, unspecified site: Secondary | ICD-10-CM | POA: Diagnosis not present

## 2020-06-17 DIAGNOSIS — I739 Peripheral vascular disease, unspecified: Secondary | ICD-10-CM | POA: Diagnosis not present

## 2020-06-17 DIAGNOSIS — I700 Atherosclerosis of aorta: Secondary | ICD-10-CM | POA: Diagnosis not present

## 2020-06-17 DIAGNOSIS — B351 Tinea unguium: Secondary | ICD-10-CM | POA: Diagnosis not present

## 2020-06-17 DIAGNOSIS — R609 Edema, unspecified: Secondary | ICD-10-CM | POA: Diagnosis not present

## 2020-06-19 DIAGNOSIS — F319 Bipolar disorder, unspecified: Secondary | ICD-10-CM | POA: Diagnosis not present

## 2020-06-19 DIAGNOSIS — M810 Age-related osteoporosis without current pathological fracture: Secondary | ICD-10-CM | POA: Diagnosis not present

## 2020-06-19 DIAGNOSIS — I10 Essential (primary) hypertension: Secondary | ICD-10-CM | POA: Diagnosis not present

## 2020-06-19 DIAGNOSIS — I700 Atherosclerosis of aorta: Secondary | ICD-10-CM | POA: Diagnosis not present

## 2020-06-19 DIAGNOSIS — M1990 Unspecified osteoarthritis, unspecified site: Secondary | ICD-10-CM | POA: Diagnosis not present

## 2020-06-19 DIAGNOSIS — I69351 Hemiplegia and hemiparesis following cerebral infarction affecting right dominant side: Secondary | ICD-10-CM | POA: Diagnosis not present

## 2020-06-24 DIAGNOSIS — M1990 Unspecified osteoarthritis, unspecified site: Secondary | ICD-10-CM | POA: Diagnosis not present

## 2020-06-24 DIAGNOSIS — I700 Atherosclerosis of aorta: Secondary | ICD-10-CM | POA: Diagnosis not present

## 2020-06-24 DIAGNOSIS — I10 Essential (primary) hypertension: Secondary | ICD-10-CM | POA: Diagnosis not present

## 2020-06-24 DIAGNOSIS — M810 Age-related osteoporosis without current pathological fracture: Secondary | ICD-10-CM | POA: Diagnosis not present

## 2020-06-24 DIAGNOSIS — I69351 Hemiplegia and hemiparesis following cerebral infarction affecting right dominant side: Secondary | ICD-10-CM | POA: Diagnosis not present

## 2020-06-24 DIAGNOSIS — F319 Bipolar disorder, unspecified: Secondary | ICD-10-CM | POA: Diagnosis not present

## 2020-06-26 DIAGNOSIS — I700 Atherosclerosis of aorta: Secondary | ICD-10-CM | POA: Diagnosis not present

## 2020-06-26 DIAGNOSIS — I10 Essential (primary) hypertension: Secondary | ICD-10-CM | POA: Diagnosis not present

## 2020-06-26 DIAGNOSIS — M1990 Unspecified osteoarthritis, unspecified site: Secondary | ICD-10-CM | POA: Diagnosis not present

## 2020-06-26 DIAGNOSIS — M810 Age-related osteoporosis without current pathological fracture: Secondary | ICD-10-CM | POA: Diagnosis not present

## 2020-06-26 DIAGNOSIS — F319 Bipolar disorder, unspecified: Secondary | ICD-10-CM | POA: Diagnosis not present

## 2020-06-26 DIAGNOSIS — I69351 Hemiplegia and hemiparesis following cerebral infarction affecting right dominant side: Secondary | ICD-10-CM | POA: Diagnosis not present

## 2020-07-02 ENCOUNTER — Telehealth: Payer: Self-pay | Admitting: Family Medicine

## 2020-07-02 DIAGNOSIS — I69351 Hemiplegia and hemiparesis following cerebral infarction affecting right dominant side: Secondary | ICD-10-CM | POA: Diagnosis not present

## 2020-07-02 DIAGNOSIS — M1990 Unspecified osteoarthritis, unspecified site: Secondary | ICD-10-CM | POA: Diagnosis not present

## 2020-07-02 DIAGNOSIS — I10 Essential (primary) hypertension: Secondary | ICD-10-CM | POA: Diagnosis not present

## 2020-07-02 DIAGNOSIS — F319 Bipolar disorder, unspecified: Secondary | ICD-10-CM | POA: Diagnosis not present

## 2020-07-02 DIAGNOSIS — I700 Atherosclerosis of aorta: Secondary | ICD-10-CM | POA: Diagnosis not present

## 2020-07-02 DIAGNOSIS — M810 Age-related osteoporosis without current pathological fracture: Secondary | ICD-10-CM | POA: Diagnosis not present

## 2020-07-02 NOTE — Telephone Encounter (Signed)
Caller:    [facility]) Call back [phone]  Need verbal for continuing home physical therapy  2  Times a week for 6 weeks

## 2020-07-02 NOTE — Telephone Encounter (Signed)
Ok to give VO? 

## 2020-07-02 NOTE — Telephone Encounter (Signed)
Left message on machine for verbal orders.  Called pt home number by mistake if they shall call back.

## 2020-07-06 DIAGNOSIS — I10 Essential (primary) hypertension: Secondary | ICD-10-CM | POA: Diagnosis not present

## 2020-07-06 DIAGNOSIS — E039 Hypothyroidism, unspecified: Secondary | ICD-10-CM | POA: Diagnosis not present

## 2020-07-06 DIAGNOSIS — H3530 Unspecified macular degeneration: Secondary | ICD-10-CM | POA: Diagnosis not present

## 2020-07-06 DIAGNOSIS — Z96651 Presence of right artificial knee joint: Secondary | ICD-10-CM | POA: Diagnosis not present

## 2020-07-06 DIAGNOSIS — Z9181 History of falling: Secondary | ICD-10-CM | POA: Diagnosis not present

## 2020-07-06 DIAGNOSIS — I700 Atherosclerosis of aorta: Secondary | ICD-10-CM | POA: Diagnosis not present

## 2020-07-06 DIAGNOSIS — M1990 Unspecified osteoarthritis, unspecified site: Secondary | ICD-10-CM | POA: Diagnosis not present

## 2020-07-06 DIAGNOSIS — F319 Bipolar disorder, unspecified: Secondary | ICD-10-CM | POA: Diagnosis not present

## 2020-07-06 DIAGNOSIS — Z7982 Long term (current) use of aspirin: Secondary | ICD-10-CM | POA: Diagnosis not present

## 2020-07-06 DIAGNOSIS — I69351 Hemiplegia and hemiparesis following cerebral infarction affecting right dominant side: Secondary | ICD-10-CM | POA: Diagnosis not present

## 2020-07-06 DIAGNOSIS — M810 Age-related osteoporosis without current pathological fracture: Secondary | ICD-10-CM | POA: Diagnosis not present

## 2020-07-07 DIAGNOSIS — I69351 Hemiplegia and hemiparesis following cerebral infarction affecting right dominant side: Secondary | ICD-10-CM | POA: Diagnosis not present

## 2020-07-07 DIAGNOSIS — I10 Essential (primary) hypertension: Secondary | ICD-10-CM | POA: Diagnosis not present

## 2020-07-07 DIAGNOSIS — M810 Age-related osteoporosis without current pathological fracture: Secondary | ICD-10-CM | POA: Diagnosis not present

## 2020-07-07 DIAGNOSIS — M1990 Unspecified osteoarthritis, unspecified site: Secondary | ICD-10-CM | POA: Diagnosis not present

## 2020-07-07 DIAGNOSIS — F319 Bipolar disorder, unspecified: Secondary | ICD-10-CM | POA: Diagnosis not present

## 2020-07-07 DIAGNOSIS — I700 Atherosclerosis of aorta: Secondary | ICD-10-CM | POA: Diagnosis not present

## 2020-07-09 DIAGNOSIS — I69351 Hemiplegia and hemiparesis following cerebral infarction affecting right dominant side: Secondary | ICD-10-CM | POA: Diagnosis not present

## 2020-07-09 DIAGNOSIS — I700 Atherosclerosis of aorta: Secondary | ICD-10-CM | POA: Diagnosis not present

## 2020-07-09 DIAGNOSIS — M810 Age-related osteoporosis without current pathological fracture: Secondary | ICD-10-CM | POA: Diagnosis not present

## 2020-07-09 DIAGNOSIS — F319 Bipolar disorder, unspecified: Secondary | ICD-10-CM | POA: Diagnosis not present

## 2020-07-09 DIAGNOSIS — I10 Essential (primary) hypertension: Secondary | ICD-10-CM | POA: Diagnosis not present

## 2020-07-09 DIAGNOSIS — M1990 Unspecified osteoarthritis, unspecified site: Secondary | ICD-10-CM | POA: Diagnosis not present

## 2020-07-14 DIAGNOSIS — I69351 Hemiplegia and hemiparesis following cerebral infarction affecting right dominant side: Secondary | ICD-10-CM | POA: Diagnosis not present

## 2020-07-14 DIAGNOSIS — F319 Bipolar disorder, unspecified: Secondary | ICD-10-CM | POA: Diagnosis not present

## 2020-07-14 DIAGNOSIS — M1990 Unspecified osteoarthritis, unspecified site: Secondary | ICD-10-CM | POA: Diagnosis not present

## 2020-07-14 DIAGNOSIS — M810 Age-related osteoporosis without current pathological fracture: Secondary | ICD-10-CM | POA: Diagnosis not present

## 2020-07-14 DIAGNOSIS — I700 Atherosclerosis of aorta: Secondary | ICD-10-CM | POA: Diagnosis not present

## 2020-07-14 DIAGNOSIS — I10 Essential (primary) hypertension: Secondary | ICD-10-CM | POA: Diagnosis not present

## 2020-07-16 DIAGNOSIS — F319 Bipolar disorder, unspecified: Secondary | ICD-10-CM | POA: Diagnosis not present

## 2020-07-16 DIAGNOSIS — M810 Age-related osteoporosis without current pathological fracture: Secondary | ICD-10-CM | POA: Diagnosis not present

## 2020-07-16 DIAGNOSIS — I700 Atherosclerosis of aorta: Secondary | ICD-10-CM | POA: Diagnosis not present

## 2020-07-16 DIAGNOSIS — I69351 Hemiplegia and hemiparesis following cerebral infarction affecting right dominant side: Secondary | ICD-10-CM | POA: Diagnosis not present

## 2020-07-16 DIAGNOSIS — M1990 Unspecified osteoarthritis, unspecified site: Secondary | ICD-10-CM | POA: Diagnosis not present

## 2020-07-16 DIAGNOSIS — I10 Essential (primary) hypertension: Secondary | ICD-10-CM | POA: Diagnosis not present

## 2020-07-21 DIAGNOSIS — M810 Age-related osteoporosis without current pathological fracture: Secondary | ICD-10-CM | POA: Diagnosis not present

## 2020-07-21 DIAGNOSIS — I10 Essential (primary) hypertension: Secondary | ICD-10-CM | POA: Diagnosis not present

## 2020-07-21 DIAGNOSIS — I700 Atherosclerosis of aorta: Secondary | ICD-10-CM | POA: Diagnosis not present

## 2020-07-21 DIAGNOSIS — I69351 Hemiplegia and hemiparesis following cerebral infarction affecting right dominant side: Secondary | ICD-10-CM | POA: Diagnosis not present

## 2020-07-21 DIAGNOSIS — M1990 Unspecified osteoarthritis, unspecified site: Secondary | ICD-10-CM | POA: Diagnosis not present

## 2020-07-21 DIAGNOSIS — F319 Bipolar disorder, unspecified: Secondary | ICD-10-CM | POA: Diagnosis not present

## 2020-07-23 DIAGNOSIS — I69351 Hemiplegia and hemiparesis following cerebral infarction affecting right dominant side: Secondary | ICD-10-CM | POA: Diagnosis not present

## 2020-07-23 DIAGNOSIS — M1990 Unspecified osteoarthritis, unspecified site: Secondary | ICD-10-CM | POA: Diagnosis not present

## 2020-07-23 DIAGNOSIS — F319 Bipolar disorder, unspecified: Secondary | ICD-10-CM | POA: Diagnosis not present

## 2020-07-23 DIAGNOSIS — I700 Atherosclerosis of aorta: Secondary | ICD-10-CM | POA: Diagnosis not present

## 2020-07-23 DIAGNOSIS — I10 Essential (primary) hypertension: Secondary | ICD-10-CM | POA: Diagnosis not present

## 2020-07-23 DIAGNOSIS — M810 Age-related osteoporosis without current pathological fracture: Secondary | ICD-10-CM | POA: Diagnosis not present

## 2020-07-24 IMAGING — CT CT HEAD W/O CM
3 series · 15 of 47 positions shown, 18 images · non-contrast
Comparison: 10/15/2014 CT

CLINICAL DATA: 85-year-old female with acute RIGHT arm and leg
weakness.

EXAM:
CT HEAD WITHOUT CONTRAST
TECHNIQUE: Contiguous axial images were obtained from the base of the skull
through the vertex without intravenous contrast.

[Series 2: head wo · axial · 0.47mm/px · z∈[+1595,+1725]mm · 9 of 32 slices shown, 12 images]
[im 3/32  brain]
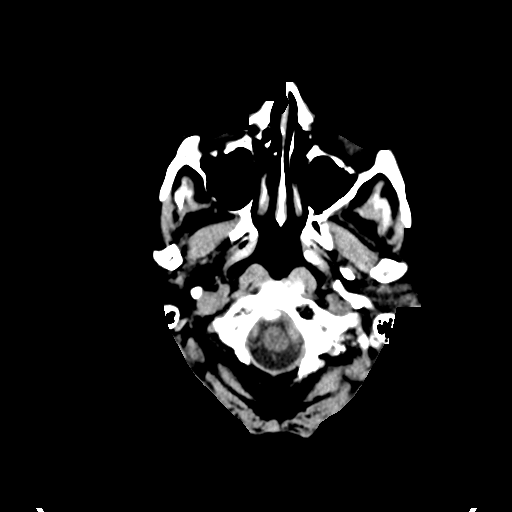
[im 3/32  bone]
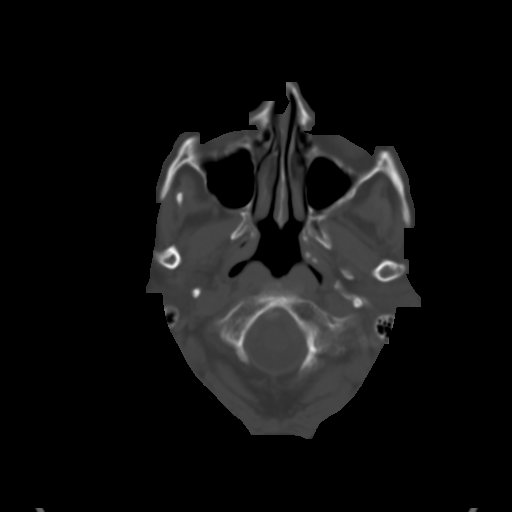
[im 6/32  brain]
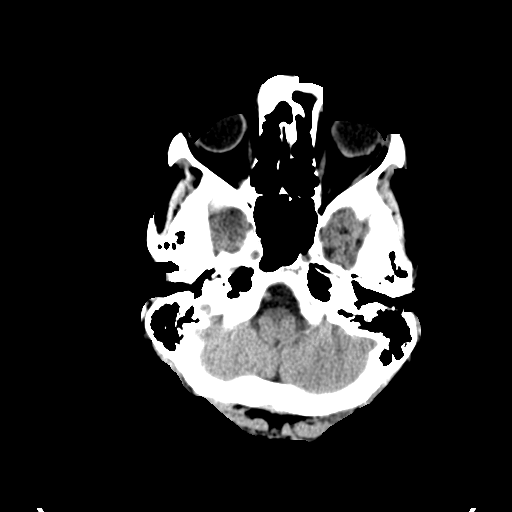
[im 9/32  brain]
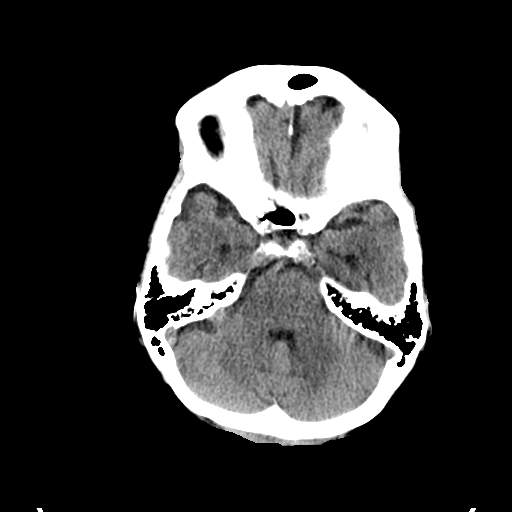
[im 12/32  brain]
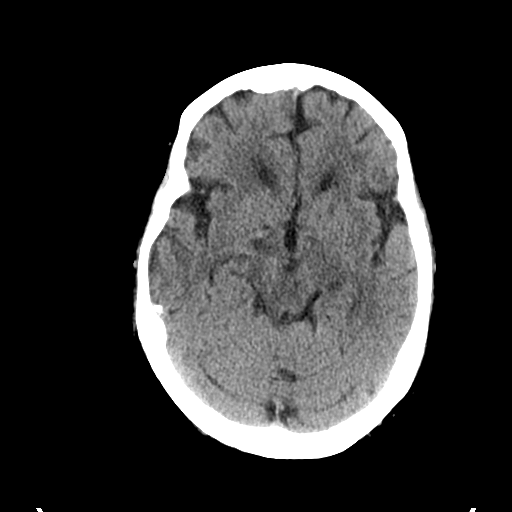
[im 17/32  brain]
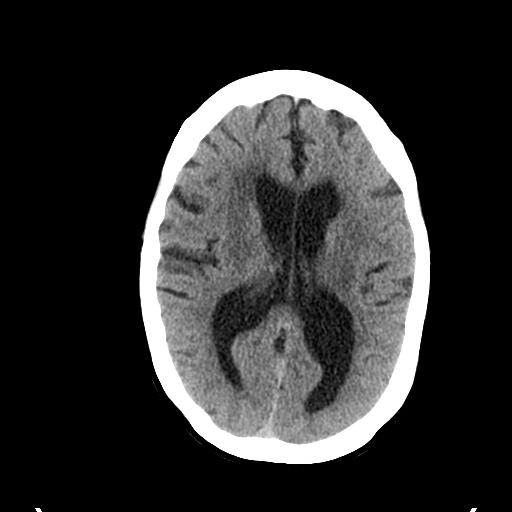
[im 17/32  bone]
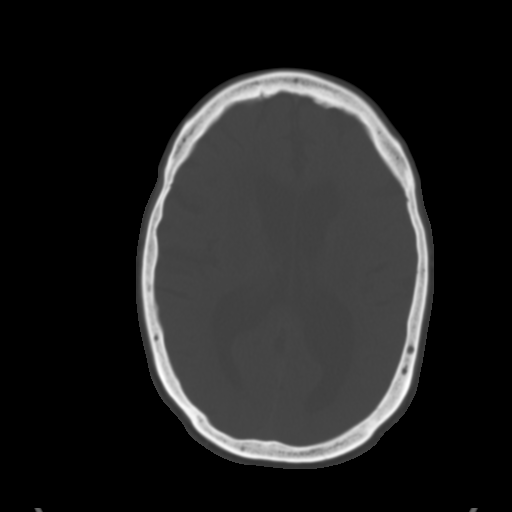
[im 20/32  brain]
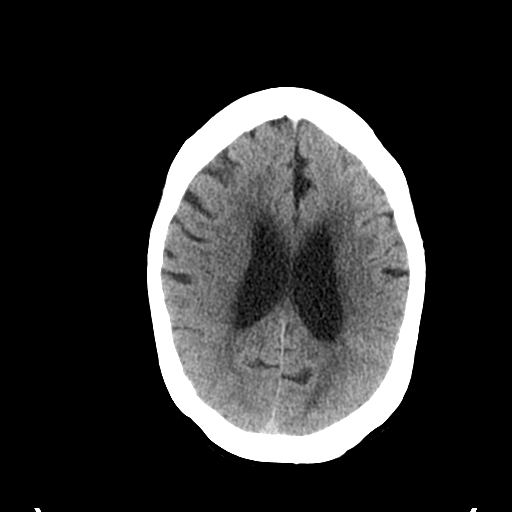
[im 23/32  brain]
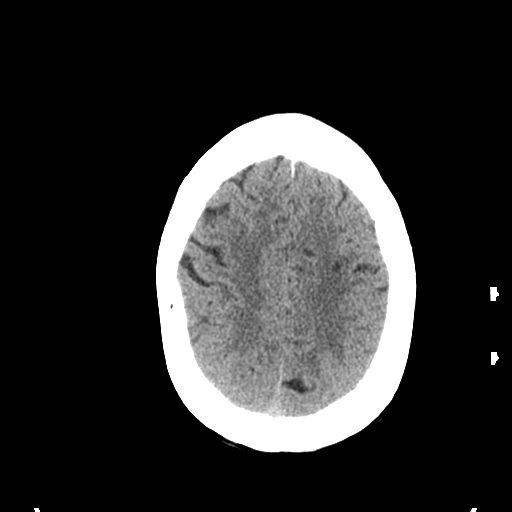
[im 26/32  brain]
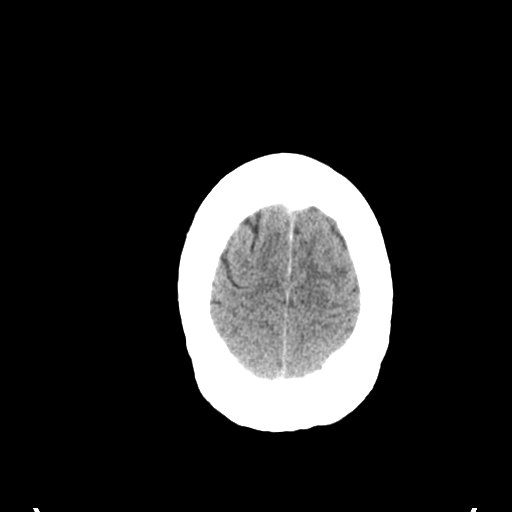
[im 29/32  brain]
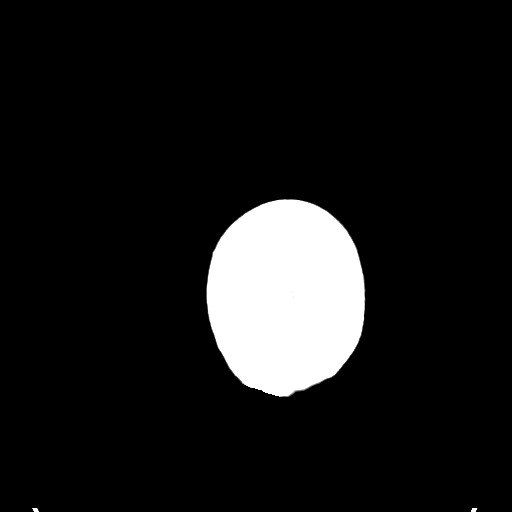
[im 29/32  bone]
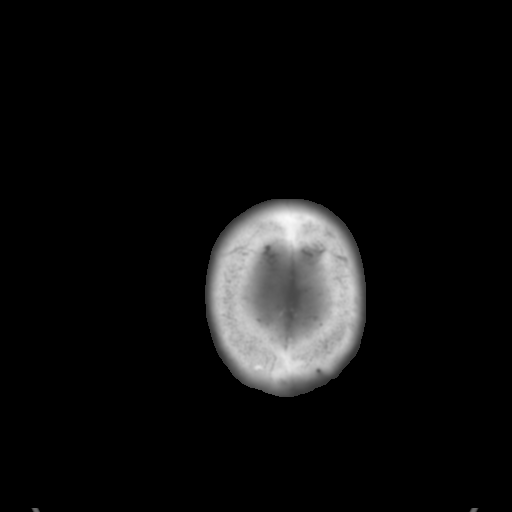

[Series 5: coronal soft tissue · coronal · 0.32mm/px · 3 of 70 slices shown]
[im 24/70  brain]
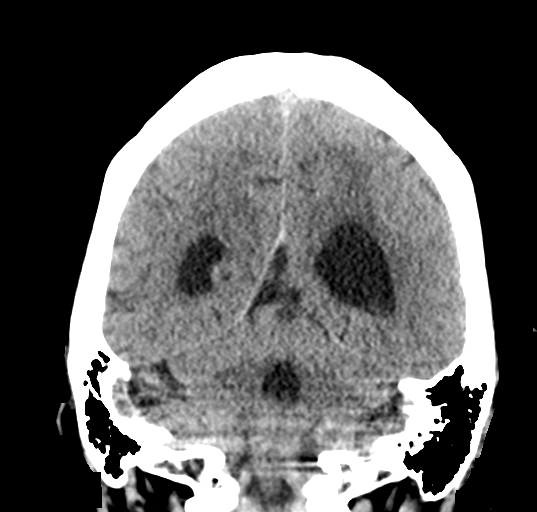
[im 31/70  brain]
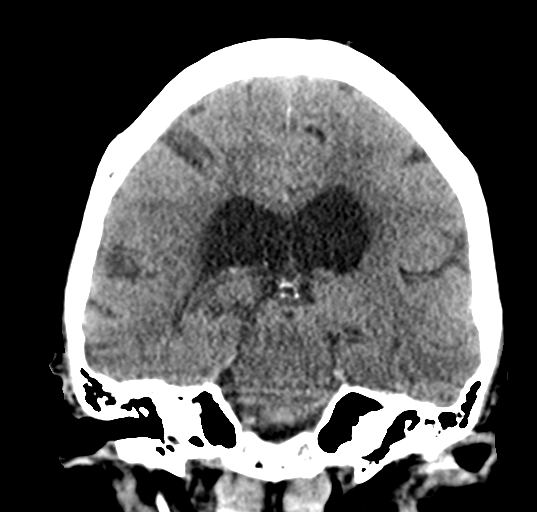
[im 39/70  brain]
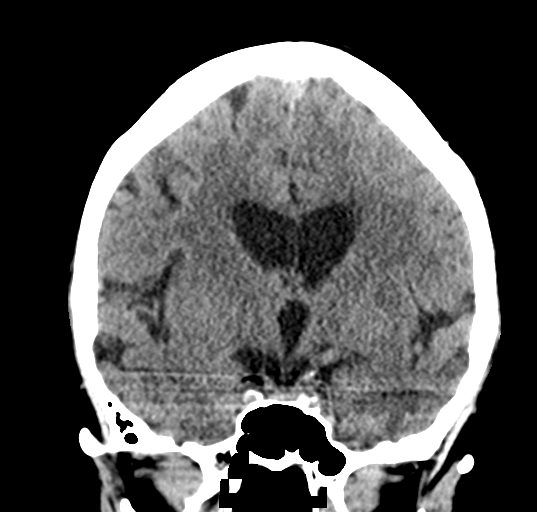

[Series 6: sagittal soft tissue · sagittal · 0.31mm/px · 3 of 54 slices shown]
[im 18/54  brain]
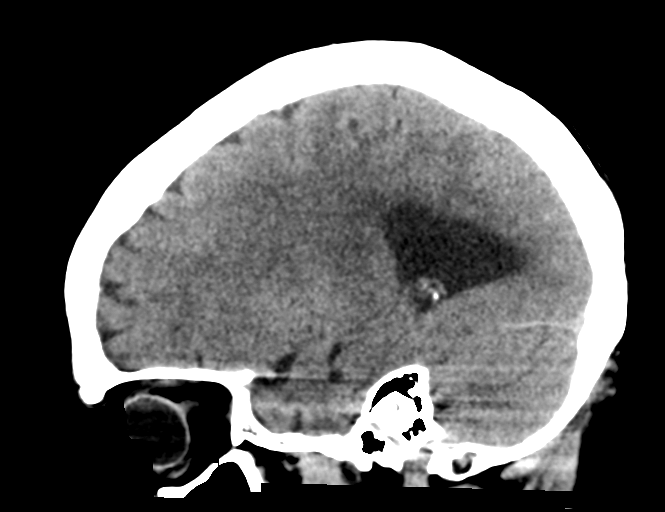
[im 27/54  brain]
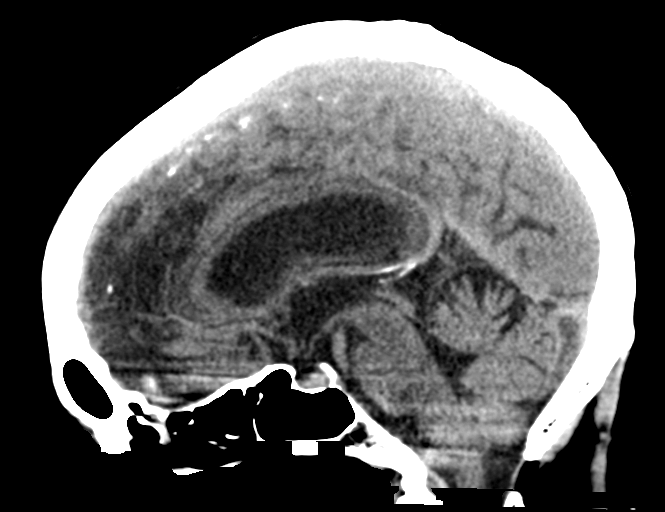
[im 36/54  brain]
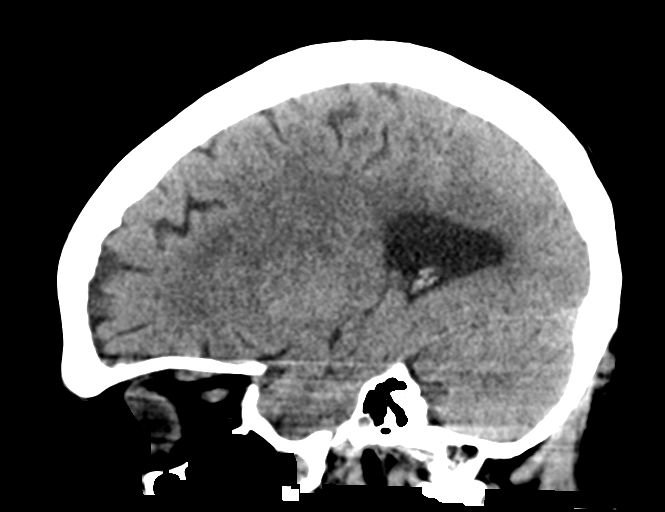

[15 of 47 positions shown; findings below may reference images not displayed]

FINDINGS: Brain: A small linear high LEFT frontal white matter hypodensity
extends to the cortex and may represent an infarct of uncertain
chronicity.

No evidence of hemorrhage, hydrocephalus, extra-axial collection or
mass lesion/mass effect.

Chronic small-vessel white matter ischemic changes are noted.

Vascular: Carotid atherosclerotic calcifications identified.

Skull: No acute or suspicious bony abnormalities.

Sinuses/Orbits: No acute finding.

Other: None
IMPRESSION: 1. Small linear high LEFT frontal white matter hypodensity extending
to the cortex-question infarct of uncertain chronicity. No
hemorrhage.
2. Chronic small-vessel white matter ischemic changes.

## 2020-07-28 DIAGNOSIS — M1990 Unspecified osteoarthritis, unspecified site: Secondary | ICD-10-CM | POA: Diagnosis not present

## 2020-07-28 DIAGNOSIS — F319 Bipolar disorder, unspecified: Secondary | ICD-10-CM | POA: Diagnosis not present

## 2020-07-28 DIAGNOSIS — I10 Essential (primary) hypertension: Secondary | ICD-10-CM | POA: Diagnosis not present

## 2020-07-28 DIAGNOSIS — I69351 Hemiplegia and hemiparesis following cerebral infarction affecting right dominant side: Secondary | ICD-10-CM | POA: Diagnosis not present

## 2020-07-28 DIAGNOSIS — M810 Age-related osteoporosis without current pathological fracture: Secondary | ICD-10-CM | POA: Diagnosis not present

## 2020-07-28 DIAGNOSIS — I700 Atherosclerosis of aorta: Secondary | ICD-10-CM | POA: Diagnosis not present

## 2020-07-30 DIAGNOSIS — I700 Atherosclerosis of aorta: Secondary | ICD-10-CM | POA: Diagnosis not present

## 2020-07-30 DIAGNOSIS — I10 Essential (primary) hypertension: Secondary | ICD-10-CM | POA: Diagnosis not present

## 2020-07-30 DIAGNOSIS — M1990 Unspecified osteoarthritis, unspecified site: Secondary | ICD-10-CM | POA: Diagnosis not present

## 2020-07-30 DIAGNOSIS — M810 Age-related osteoporosis without current pathological fracture: Secondary | ICD-10-CM | POA: Diagnosis not present

## 2020-07-30 DIAGNOSIS — F319 Bipolar disorder, unspecified: Secondary | ICD-10-CM | POA: Diagnosis not present

## 2020-07-30 DIAGNOSIS — I69351 Hemiplegia and hemiparesis following cerebral infarction affecting right dominant side: Secondary | ICD-10-CM | POA: Diagnosis not present

## 2020-08-05 DIAGNOSIS — M1990 Unspecified osteoarthritis, unspecified site: Secondary | ICD-10-CM | POA: Diagnosis not present

## 2020-08-05 DIAGNOSIS — Z9181 History of falling: Secondary | ICD-10-CM | POA: Diagnosis not present

## 2020-08-05 DIAGNOSIS — F319 Bipolar disorder, unspecified: Secondary | ICD-10-CM | POA: Diagnosis not present

## 2020-08-05 DIAGNOSIS — Z96651 Presence of right artificial knee joint: Secondary | ICD-10-CM | POA: Diagnosis not present

## 2020-08-05 DIAGNOSIS — I700 Atherosclerosis of aorta: Secondary | ICD-10-CM | POA: Diagnosis not present

## 2020-08-05 DIAGNOSIS — M810 Age-related osteoporosis without current pathological fracture: Secondary | ICD-10-CM | POA: Diagnosis not present

## 2020-08-05 DIAGNOSIS — E039 Hypothyroidism, unspecified: Secondary | ICD-10-CM | POA: Diagnosis not present

## 2020-08-05 DIAGNOSIS — I10 Essential (primary) hypertension: Secondary | ICD-10-CM | POA: Diagnosis not present

## 2020-08-05 DIAGNOSIS — Z7982 Long term (current) use of aspirin: Secondary | ICD-10-CM | POA: Diagnosis not present

## 2020-08-05 DIAGNOSIS — I69351 Hemiplegia and hemiparesis following cerebral infarction affecting right dominant side: Secondary | ICD-10-CM | POA: Diagnosis not present

## 2020-08-05 DIAGNOSIS — H3530 Unspecified macular degeneration: Secondary | ICD-10-CM | POA: Diagnosis not present

## 2020-08-08 DIAGNOSIS — F319 Bipolar disorder, unspecified: Secondary | ICD-10-CM | POA: Diagnosis not present

## 2020-08-08 DIAGNOSIS — M1990 Unspecified osteoarthritis, unspecified site: Secondary | ICD-10-CM | POA: Diagnosis not present

## 2020-08-08 DIAGNOSIS — I10 Essential (primary) hypertension: Secondary | ICD-10-CM | POA: Diagnosis not present

## 2020-08-08 DIAGNOSIS — M810 Age-related osteoporosis without current pathological fracture: Secondary | ICD-10-CM | POA: Diagnosis not present

## 2020-08-08 DIAGNOSIS — I700 Atherosclerosis of aorta: Secondary | ICD-10-CM | POA: Diagnosis not present

## 2020-08-08 DIAGNOSIS — I69351 Hemiplegia and hemiparesis following cerebral infarction affecting right dominant side: Secondary | ICD-10-CM | POA: Diagnosis not present

## 2020-08-12 DIAGNOSIS — I700 Atherosclerosis of aorta: Secondary | ICD-10-CM | POA: Diagnosis not present

## 2020-08-12 DIAGNOSIS — M810 Age-related osteoporosis without current pathological fracture: Secondary | ICD-10-CM | POA: Diagnosis not present

## 2020-08-12 DIAGNOSIS — F319 Bipolar disorder, unspecified: Secondary | ICD-10-CM | POA: Diagnosis not present

## 2020-08-12 DIAGNOSIS — I10 Essential (primary) hypertension: Secondary | ICD-10-CM | POA: Diagnosis not present

## 2020-08-12 DIAGNOSIS — I69351 Hemiplegia and hemiparesis following cerebral infarction affecting right dominant side: Secondary | ICD-10-CM | POA: Diagnosis not present

## 2020-08-12 DIAGNOSIS — M1990 Unspecified osteoarthritis, unspecified site: Secondary | ICD-10-CM | POA: Diagnosis not present

## 2020-08-18 ENCOUNTER — Telehealth: Payer: Self-pay

## 2020-08-18 NOTE — Telephone Encounter (Signed)
Please schedule pt for virtual appt with Charlett Blake on Friday at 920 am for provider to sign and document needed podiatry orders

## 2020-08-19 DIAGNOSIS — M1990 Unspecified osteoarthritis, unspecified site: Secondary | ICD-10-CM | POA: Diagnosis not present

## 2020-08-19 DIAGNOSIS — I700 Atherosclerosis of aorta: Secondary | ICD-10-CM | POA: Diagnosis not present

## 2020-08-19 DIAGNOSIS — R600 Localized edema: Secondary | ICD-10-CM | POA: Diagnosis not present

## 2020-08-19 DIAGNOSIS — M810 Age-related osteoporosis without current pathological fracture: Secondary | ICD-10-CM | POA: Diagnosis not present

## 2020-08-19 DIAGNOSIS — F319 Bipolar disorder, unspecified: Secondary | ICD-10-CM | POA: Diagnosis not present

## 2020-08-19 DIAGNOSIS — I739 Peripheral vascular disease, unspecified: Secondary | ICD-10-CM | POA: Diagnosis not present

## 2020-08-19 DIAGNOSIS — I69351 Hemiplegia and hemiparesis following cerebral infarction affecting right dominant side: Secondary | ICD-10-CM | POA: Diagnosis not present

## 2020-08-19 DIAGNOSIS — B351 Tinea unguium: Secondary | ICD-10-CM | POA: Diagnosis not present

## 2020-08-19 DIAGNOSIS — I10 Essential (primary) hypertension: Secondary | ICD-10-CM | POA: Diagnosis not present

## 2020-08-22 ENCOUNTER — Other Ambulatory Visit: Payer: Self-pay

## 2020-08-22 ENCOUNTER — Telehealth (INDEPENDENT_AMBULATORY_CARE_PROVIDER_SITE_OTHER): Payer: Medicare Other | Admitting: Family Medicine

## 2020-08-22 DIAGNOSIS — Z8673 Personal history of transient ischemic attack (TIA), and cerebral infarction without residual deficits: Secondary | ICD-10-CM

## 2020-08-22 DIAGNOSIS — E785 Hyperlipidemia, unspecified: Secondary | ICD-10-CM | POA: Insufficient documentation

## 2020-08-22 DIAGNOSIS — E039 Hypothyroidism, unspecified: Secondary | ICD-10-CM | POA: Diagnosis not present

## 2020-08-22 DIAGNOSIS — I10 Essential (primary) hypertension: Secondary | ICD-10-CM | POA: Diagnosis not present

## 2020-08-22 DIAGNOSIS — L84 Corns and callosities: Secondary | ICD-10-CM | POA: Diagnosis not present

## 2020-08-22 NOTE — Assessment & Plan Note (Signed)
She is in need of regular podiatry appointments to manage her corns and thick toenails

## 2020-08-22 NOTE — Assessment & Plan Note (Signed)
Monitor and report any concerns, no changes to meds. Encouraged heart healthy diet such as the DASH diet and exercise as tolerated.  ?

## 2020-08-22 NOTE — Assessment & Plan Note (Signed)
On Levothyroxine, continue to monitor 

## 2020-08-22 NOTE — Assessment & Plan Note (Signed)
She is now residing at [facility] and they are considering switching her care to the facility physician now that she is wheelchair bound and it is harder to get to the office. They will let us know when they decide

## 2020-08-22 NOTE — Progress Notes (Signed)
Virtual Visit via Video Note  I connected with [name] [name] on 08/22/20 at  9:20 AM EDT by a video enabled telemedicine application and verified that I am speaking with the correct person using two identifiers.  Location: Patient: [name], patient, son and provider in the visit Provider: home   I discussed the limitations of evaluation and management by telemedicine and the availability of in person appointments. The patient expressed understanding and agreed to proceed. S Chism, CMA was able to get the patient set up on a video visit   Subjective:    Patient ID: [name] [name], female    DOB: 1932-12-16, 86 y.o.   MRN: [id number]  Chief Complaint  Patient presents with  . Follow-up    HPI Patient is in today for follow up on chronic medical concerns. No recent febrile illness or hospitalizations. She is now living at [facility] after her stroke. She underwent physical therapy and she feels stronger. She is still wheelchair bound but able to wheel herself down to the dining hall. She has recently been seen by podiatry for her corns and thick toenails and that has been helpful. Denies CP/palp/SOB/HA/congestion/fevers/GI or GU c/o. Taking meds as prescribed. They will be getting the [place] booster soon.   Past Medical History:  Diagnosis Date  . Anemia    h/o fibroids  . Bipolar disorder ([place])   . Depression    bipolar  . Eczema   . H/O stroke within last year 11/19/2018  . HTN (hypertension)   . Hypothyroidism   . Insomnia   . Osteoarthritis   . Osteoporosis   . Psoriasis     Past Surgical History:  Procedure Laterality Date  . ABDOMINAL HYSTERECTOMY  1982  . APPENDECTOMY  2003  . BREAST LUMPECTOMY  1947   left  . CATARACT EXTRACTION  2010  . EYE SURGERY    . HERNIA REPAIR    . KNEE ARTHROSCOPY  09/07/11   right knee  . LOOP RECORDER INSERTION N/A 11/20/2018   Procedure: LOOP RECORDER INSERTION;  Surgeon: Evans Lance, MD;   Location: [place] [facility];  Service: Cardiovascular;  Laterality: N/A;  . LOOP RECORDER INSERTION  11/20/2018   Procedure: LOOP RECORDER INSERTION;  Surgeon: Jerline Pain, MD;  Location: [place];  Service: Cardiovascular;;  . NASAL SEPTUM SURGERY  1970's  . TEE WITHOUT CARDIOVERSION N/A 11/20/2018   Procedure: TRANSESOPHAGEAL ECHOCARDIOGRAM (TEE);  Surgeon: Jerline Pain, MD;  Location: [facility] ENDOSCOPY;  Service: Cardiovascular;  Laterality: N/A;  . TONSILLECTOMY  1970's  . TONSILLECTOMY    . TOTAL KNEE ARTHROPLASTY Right 03/04/2015   Procedure: RIGHT TOTAL KNEE ARTHROPLASTY;  Surgeon: Mcarthur Rossetti, MD;  Location: [place];  Service: Orthopedics;  Laterality: Right;  . VENTRAL HERNIA REPAIR  2005    Family History  Problem Relation Age of Onset  . Colon cancer Brother   . Lung cancer Father   . Prostate cancer Father   . Arthritis Father   . Cancer Father        lung, prostate  . Heart disease Brother   . Cancer Brother        lung  . Lung cancer Brother   . Aortic aneurysm Brother   . Cancer Other        colon    Social History   Socioeconomic History  . Marital status: Single    Spouse name: Not on file  . Number of children:  1  . Years of education: Not on file  . Highest education level: Not on file  Occupational History  . Occupation: retired    Fish farm manager: RETIRED  Tobacco Use  . Smoking status: Former Smoker    Quit date: 09/29/1985    Years since quitting: 34.9  . Smokeless tobacco: Never Used  Substance and Sexual Activity  . Alcohol use: No  . Drug use: No  . Sexual activity: Not Currently    Partners: Male  Other Topics Concern  . Not on file  Social History Narrative   Lives by herself   PT for knee----silver sneakers   Social Determinants of Health   Financial Resource Strain:   . Difficulty of Paying Living Expenses: Not on file  Food Insecurity:   . Worried About Charity fundraiser in the Last Year: Not on file  . Ran Out of Food  in the Last Year: Not on file  Transportation Needs:   . Lack of Transportation (Medical): Not on file  . Lack of Transportation (Non-Medical): Not on file  Physical Activity:   . Days of Exercise per Week: Not on file  . Minutes of Exercise per Session: Not on file  Stress:   . Feeling of Stress : Not on file  Social Connections:   . Frequency of Communication with Friends and Family: Not on file  . Frequency of Social Gatherings with Friends and Family: Not on file  . Attends Religious Services: Not on file  . Active Member of Clubs or Organizations: Not on file  . Attends Archivist Meetings: Not on file  . Marital Status: Not on file  Intimate Partner Violence:   . Fear of Current or Ex-Partner: Not on file  . Emotionally Abused: Not on file  . Physically Abused: Not on file  . Sexually Abused: Not on file    Outpatient Medications Prior to Visit  Medication Sig Dispense Refill  . amLODipine (NORVASC) 5 MG tablet Take 1 tablet (5 mg total) by mouth daily. 90 tablet 3  . ARIPiprazole (ABILIFY) 5 MG tablet Take 1 tablet (5 mg total) by mouth daily. 3 tablet 0  . aspirin EC 81 MG EC tablet Take 1 tablet (81 mg total) by mouth daily. 30 tablet 0  . atorvastatin (LIPITOR) 40 MG tablet Take 1 tablet (40 mg total) by mouth daily at 6 PM. 30 tablet 0  . cholecalciferol (VITAMIN D) 1000 UNITS tablet Take 3,000 Units by mouth daily.     . ciprofloxacin (CIPRO) 250 MG tablet Take 1 tablet (250 mg total) by mouth 2 (two) times daily. 6 tablet 0  . clopidogrel (PLAVIX) 75 MG tablet Take 1 tablet (75 mg total) by mouth daily. 30 tablet 0  . furosemide (LASIX) 20 MG tablet Take 1 tablet (20 mg total) by mouth daily. 30 tablet   . levothyroxine (SYNTHROID) 88 MCG tablet TAKE 1 TABLET BY MOUTH EVERY DAY 90 tablet 1  . Melatonin 5 MG TABS Take 3 tablets (15 mg total) by mouth at bedtime. 3 tablet 0  . multivitamin (PROSIGHT) TABS tablet Take 1 tablet by mouth daily. 30 each 0  .  potassium chloride SA (K-DUR,KLOR-CON) 20 MEQ tablet Take 1 tablet (20 mEq total) by mouth daily.    Marland Kitchen senna-docusate (SENOKOT-S) 8.6-50 MG tablet Take 2 tablets by mouth 2 (two) times daily.    . Triamcinolone Acetonide (TRIAMCINOLONE 0.1 % CREAM : EUCERIN) CREA Apply 1 application topically daily as needed. 2  each 3  . triamcinolone cream (KENALOG) 0.1 % Apply 1 application topically daily as needed. 30 g 3   No facility-administered medications prior to visit.    Allergies  Allergen Reactions  . Bupropion Hcl Rash    Review of Systems  Constitutional: Positive for malaise/fatigue. Negative for fever.  HENT: Negative for congestion.   Eyes: Negative for blurred vision.  Respiratory: Negative for shortness of breath.   Cardiovascular: Negative for chest pain, palpitations and leg swelling.  Gastrointestinal: Negative for abdominal pain, blood in stool and nausea.  Genitourinary: Negative for dysuria and frequency.  Musculoskeletal: Negative for falls.  Skin: Negative for rash.  Neurological: Negative for dizziness, loss of consciousness and headaches.  Endo/Heme/Allergies: Negative for environmental allergies.  Psychiatric/Behavioral: Negative for depression. The patient is not nervous/anxious.        Objective:    Physical Exam Constitutional:      Appearance: Normal appearance. She is not ill-appearing.  HENT:     Head: Normocephalic and atraumatic.     Nose: Nose normal.  Eyes:     General:        Right eye: No discharge.        Left eye: Discharge present. Pulmonary:     Effort: Pulmonary effort is normal.  Neurological:     Mental Status: She is alert and oriented to person, place, and time.  Psychiatric:        Behavior: Behavior normal.     There were no vitals taken for this visit. Wt Readings from Last 3 Encounters:  03/12/20 150 lb (68 kg)  01/11/19 146 lb (66.2 kg)  11/22/18 150 lb 6.1 oz (68.2 kg)    Diabetic Foot Exam - Simple   No data filed      Lab Results  Component Value Date   WBC 7.5 05/22/2019   HGB 14.2 05/22/2019   HCT 42.5 05/22/2019   PLT 304.0 05/22/2019   GLUCOSE 96 03/12/2020   CHOL 228 (H) 05/22/2019   TRIG 134.0 05/22/2019   HDL 56.10 05/22/2019   LDLDIRECT 150.7 09/06/2013   LDLCALC 145 (H) 05/22/2019   ALT 16 03/12/2020   AST 17 03/12/2020   NA 140 03/12/2020   K 3.6 03/12/2020   CL 102 03/12/2020   CREATININE 1.02 03/12/2020   BUN 17 03/12/2020   CO2 29 03/12/2020   TSH 2.32 05/22/2019   INR 0.90 11/18/2018   HGBA1C 5.2 11/19/2018    Lab Results  Component Value Date   TSH 2.32 05/22/2019   Lab Results  Component Value Date   WBC 7.5 05/22/2019   HGB 14.2 05/22/2019   HCT 42.5 05/22/2019   MCV 96.7 05/22/2019   PLT 304.0 05/22/2019   Lab Results  Component Value Date   NA 140 03/12/2020   K 3.6 03/12/2020   CO2 29 03/12/2020   GLUCOSE 96 03/12/2020   BUN 17 03/12/2020   CREATININE 1.02 03/12/2020   BILITOT 0.4 03/12/2020   ALKPHOS 30 (L) 03/12/2020   AST 17 03/12/2020   ALT 16 03/12/2020   PROT 6.9 03/12/2020   ALBUMIN 4.2 03/12/2020   CALCIUM 9.5 03/12/2020   ANIONGAP 10 11/27/2018   GFR 51.54 (L) 03/12/2020   Lab Results  Component Value Date   CHOL 228 (H) 05/22/2019   Lab Results  Component Value Date   HDL 56.10 05/22/2019   Lab Results  Component Value Date   LDLCALC 145 (H) 05/22/2019   Lab Results  Component Value Date  TRIG 134.0 05/22/2019   Lab Results  Component Value Date   CHOLHDL 4 05/22/2019   Lab Results  Component Value Date   HGBA1C 5.2 11/19/2018       Assessment & Plan:   Problem List Items Addressed This Visit    Hypothyroidism    On Levothyroxine, continue to monitor      H/O: stroke    She is now residing at [facility] and they are considering switching her care to the facility physician now that she is wheelchair bound and it is harder to get to the office. They will let us know when they decide      HTN  (hypertension)    Monitor and report any concerns, no changes to meds. Encouraged heart healthy diet such as the DASH diet and exercise as tolerated.       Hyperlipidemia    Tolerating statin, encouraged heart healthy diet, avoid trans fats, minimize simple carbs and saturated fats. Increase exercise as tolerated      Corns/callosities    She is in need of regular podiatry appointments to manage her corns and thick toenails         I am having Royalti A. Messenger maintain her cholecalciferol, aspirin, atorvastatin, clopidogrel, ARIPiprazole, furosemide, melatonin, multivitamin, potassium chloride SA, senna-docusate, amLODipine, triamcinolone cream, triamcinolone 0.1 % cream : eucerin, levothyroxine, and ciprofloxacin.  No orders of the defined types were placed in this encounter.     I discussed the assessment and treatment plan with the patient. The patient was provided an opportunity to ask questions and all were answered. The patient agreed with the plan and demonstrated an understanding of the instructions.   The patient was advised to call back or seek an in-person evaluation if the symptoms worsen or if the condition fails to improve as anticipated.  I provided 20 minutes of face-to-face time during this encounter.   Penni Homans, MD

## 2020-08-22 NOTE — Assessment & Plan Note (Signed)
Tolerating statin, encouraged heart healthy diet, avoid trans fats, minimize simple carbs and saturated fats. Increase exercise as tolerated 

## 2020-09-15 ENCOUNTER — Telehealth: Payer: Self-pay

## 2020-09-15 NOTE — Telephone Encounter (Signed)
Fax receviced today of pt recent episode of care. Provider advised and document faxed back to facility with

## 2020-09-16 ENCOUNTER — Telehealth: Payer: Medicare Other | Admitting: Family Medicine

## 2020-11-24 DIAGNOSIS — F3161 Bipolar disorder, current episode mixed, mild: Secondary | ICD-10-CM | POA: Diagnosis not present

## 2020-11-26 DIAGNOSIS — E039 Hypothyroidism, unspecified: Secondary | ICD-10-CM | POA: Diagnosis not present

## 2020-11-26 DIAGNOSIS — E785 Hyperlipidemia, unspecified: Secondary | ICD-10-CM | POA: Diagnosis not present

## 2020-11-26 DIAGNOSIS — E559 Vitamin D deficiency, unspecified: Secondary | ICD-10-CM | POA: Diagnosis not present

## 2020-11-26 DIAGNOSIS — I10 Essential (primary) hypertension: Secondary | ICD-10-CM | POA: Diagnosis not present

## 2020-11-29 DIAGNOSIS — F319 Bipolar disorder, unspecified: Secondary | ICD-10-CM | POA: Diagnosis not present

## 2020-11-29 DIAGNOSIS — J449 Chronic obstructive pulmonary disease, unspecified: Secondary | ICD-10-CM | POA: Diagnosis not present

## 2020-11-29 DIAGNOSIS — I700 Atherosclerosis of aorta: Secondary | ICD-10-CM | POA: Diagnosis not present

## 2020-11-29 DIAGNOSIS — Z87891 Personal history of nicotine dependence: Secondary | ICD-10-CM | POA: Diagnosis not present

## 2020-11-29 DIAGNOSIS — I10 Essential (primary) hypertension: Secondary | ICD-10-CM | POA: Diagnosis not present

## 2020-11-29 DIAGNOSIS — Z7902 Long term (current) use of antithrombotics/antiplatelets: Secondary | ICD-10-CM | POA: Diagnosis not present

## 2020-11-29 DIAGNOSIS — Z7982 Long term (current) use of aspirin: Secondary | ICD-10-CM | POA: Diagnosis not present

## 2020-11-29 DIAGNOSIS — M150 Primary generalized (osteo)arthritis: Secondary | ICD-10-CM | POA: Diagnosis not present

## 2020-12-02 DIAGNOSIS — M150 Primary generalized (osteo)arthritis: Secondary | ICD-10-CM | POA: Diagnosis not present

## 2020-12-02 DIAGNOSIS — I700 Atherosclerosis of aorta: Secondary | ICD-10-CM | POA: Diagnosis not present

## 2020-12-02 DIAGNOSIS — F319 Bipolar disorder, unspecified: Secondary | ICD-10-CM | POA: Diagnosis not present

## 2020-12-02 DIAGNOSIS — I10 Essential (primary) hypertension: Secondary | ICD-10-CM | POA: Diagnosis not present

## 2020-12-02 DIAGNOSIS — Z87891 Personal history of nicotine dependence: Secondary | ICD-10-CM | POA: Diagnosis not present

## 2020-12-02 DIAGNOSIS — J449 Chronic obstructive pulmonary disease, unspecified: Secondary | ICD-10-CM | POA: Diagnosis not present

## 2020-12-05 DIAGNOSIS — I10 Essential (primary) hypertension: Secondary | ICD-10-CM | POA: Diagnosis not present

## 2020-12-05 DIAGNOSIS — I700 Atherosclerosis of aorta: Secondary | ICD-10-CM | POA: Diagnosis not present

## 2020-12-05 DIAGNOSIS — Z87891 Personal history of nicotine dependence: Secondary | ICD-10-CM | POA: Diagnosis not present

## 2020-12-05 DIAGNOSIS — J449 Chronic obstructive pulmonary disease, unspecified: Secondary | ICD-10-CM | POA: Diagnosis not present

## 2020-12-05 DIAGNOSIS — M150 Primary generalized (osteo)arthritis: Secondary | ICD-10-CM | POA: Diagnosis not present

## 2020-12-05 DIAGNOSIS — F319 Bipolar disorder, unspecified: Secondary | ICD-10-CM | POA: Diagnosis not present

## 2020-12-09 DIAGNOSIS — Z87891 Personal history of nicotine dependence: Secondary | ICD-10-CM | POA: Diagnosis not present

## 2020-12-09 DIAGNOSIS — I10 Essential (primary) hypertension: Secondary | ICD-10-CM | POA: Diagnosis not present

## 2020-12-09 DIAGNOSIS — F319 Bipolar disorder, unspecified: Secondary | ICD-10-CM | POA: Diagnosis not present

## 2020-12-09 DIAGNOSIS — J449 Chronic obstructive pulmonary disease, unspecified: Secondary | ICD-10-CM | POA: Diagnosis not present

## 2020-12-09 DIAGNOSIS — M150 Primary generalized (osteo)arthritis: Secondary | ICD-10-CM | POA: Diagnosis not present

## 2020-12-09 DIAGNOSIS — I700 Atherosclerosis of aorta: Secondary | ICD-10-CM | POA: Diagnosis not present

## 2020-12-10 DIAGNOSIS — F3161 Bipolar disorder, current episode mixed, mild: Secondary | ICD-10-CM | POA: Diagnosis not present

## 2020-12-12 DIAGNOSIS — I700 Atherosclerosis of aorta: Secondary | ICD-10-CM | POA: Diagnosis not present

## 2020-12-12 DIAGNOSIS — I10 Essential (primary) hypertension: Secondary | ICD-10-CM | POA: Diagnosis not present

## 2020-12-12 DIAGNOSIS — Z87891 Personal history of nicotine dependence: Secondary | ICD-10-CM | POA: Diagnosis not present

## 2020-12-12 DIAGNOSIS — J449 Chronic obstructive pulmonary disease, unspecified: Secondary | ICD-10-CM | POA: Diagnosis not present

## 2020-12-12 DIAGNOSIS — F319 Bipolar disorder, unspecified: Secondary | ICD-10-CM | POA: Diagnosis not present

## 2020-12-12 DIAGNOSIS — M150 Primary generalized (osteo)arthritis: Secondary | ICD-10-CM | POA: Diagnosis not present

## 2020-12-14 ENCOUNTER — Emergency Department (HOSPITAL_COMMUNITY)
Admission: EM | Admit: 2020-12-14 | Discharge: 2020-12-15 | Disposition: A | Discharge status: Home / Self Care | Payer: Medicare Other | Attending: Emergency Medicine | Admitting: Emergency Medicine

## 2020-12-14 ENCOUNTER — Other Ambulatory Visit: Payer: Self-pay

## 2020-12-14 DIAGNOSIS — I10 Essential (primary) hypertension: Secondary | ICD-10-CM | POA: Diagnosis not present

## 2020-12-14 DIAGNOSIS — E039 Hypothyroidism, unspecified: Secondary | ICD-10-CM | POA: Insufficient documentation

## 2020-12-14 DIAGNOSIS — Z87891 Personal history of nicotine dependence: Secondary | ICD-10-CM | POA: Diagnosis not present

## 2020-12-14 DIAGNOSIS — Z7982 Long term (current) use of aspirin: Secondary | ICD-10-CM | POA: Insufficient documentation

## 2020-12-14 DIAGNOSIS — N939 Abnormal uterine and vaginal bleeding, unspecified: Secondary | ICD-10-CM | POA: Insufficient documentation

## 2020-12-14 DIAGNOSIS — Z96651 Presence of right artificial knee joint: Secondary | ICD-10-CM | POA: Diagnosis not present

## 2020-12-14 DIAGNOSIS — R319 Hematuria, unspecified: Secondary | ICD-10-CM

## 2020-12-14 DIAGNOSIS — J449 Chronic obstructive pulmonary disease, unspecified: Secondary | ICD-10-CM | POA: Insufficient documentation

## 2020-12-14 DIAGNOSIS — Z79899 Other long term (current) drug therapy: Secondary | ICD-10-CM | POA: Diagnosis not present

## 2020-12-14 DIAGNOSIS — R58 Hemorrhage, not elsewhere classified: Secondary | ICD-10-CM | POA: Diagnosis not present

## 2020-12-14 LAB — CBC WITH DIFFERENTIAL/PLATELET
Abs Immature Granulocytes: 0.08 10*3/uL — ABNORMAL HIGH (ref 0.00–0.07)
Basophils Absolute: 0.1 10*3/uL (ref 0.0–0.1)
Basophils Relative: 1 %
Eosinophils Absolute: 0 10*3/uL (ref 0.0–0.5)
Eosinophils Relative: 0 %
HCT: 36.6 % (ref 36.0–46.0)
Hemoglobin: 11.4 g/dL — ABNORMAL LOW (ref 12.0–15.0)
Immature Granulocytes: 1 %
Lymphocytes Relative: 24 %
Lymphs Abs: 2.5 10*3/uL (ref 0.7–4.0)
MCH: 29.9 pg (ref 26.0–34.0)
MCHC: 31.1 g/dL (ref 30.0–36.0)
MCV: 96.1 fL (ref 80.0–100.0)
Monocytes Absolute: 0.9 10*3/uL (ref 0.1–1.0)
Monocytes Relative: 9 %
Neutro Abs: 6.9 10*3/uL (ref 1.7–7.7)
Neutrophils Relative %: 65 %
Platelets: 283 10*3/uL (ref 150–400)
RBC: 3.81 MIL/uL — ABNORMAL LOW (ref 3.87–5.11)
RDW: 13.4 % (ref 11.5–15.5)
WBC: 10.5 10*3/uL (ref 4.0–10.5)
nRBC: 0 % (ref 0.0–0.2)

## 2020-12-14 LAB — PROTIME-INR
INR: 1 (ref 0.8–1.2)
Prothrombin Time: 12.3 seconds (ref 11.4–15.2)

## 2020-12-14 LAB — BASIC METABOLIC PANEL
Anion gap: 10 (ref 5–15)
BUN: 17 mg/dL (ref 8–23)
CO2: 25 mmol/L (ref 22–32)
Calcium: 9 mg/dL (ref 8.9–10.3)
Chloride: 104 mmol/L (ref 98–111)
Creatinine, Ser: 0.86 mg/dL (ref 0.44–1.00)
GFR, Estimated: >60 mL/min (ref >60)
Glucose, Bld: 112 mg/dL — ABNORMAL HIGH (ref 70–99)
Potassium: 3.4 mmol/L — ABNORMAL LOW (ref 3.5–5.1)
Sodium: 139 mmol/L (ref 135–145)

## 2020-12-14 NOTE — ED Triage Notes (Signed)
Pt arrived from [facility] by EMS for vaginal bleeding that started yesterday. Pt denies any complaints at present. Staff reported bright red blood and clots.   Son [name] to be contacted for update [phone]

## 2020-12-14 NOTE — ED Provider Notes (Addendum)
[facility] EMERGENCY DEPARTMENT Provider Note   CSN: [id number] Arrival date & time: 12/14/20  2010     History Chief Complaint  Patient presents with  . Vaginal Bleeding    Tassie A Ishee is a 87 y.o. female with h/o anemia, fibroids, Bipolar disorder, depression, previous CVA, HTN, hysterectomy, and hypothyroidism who presents to the ED via EMS for suspected vaginal bleeding. CNA at SNF yesterday noticed small amount of red blood in diaper. This evening, CNA reported seeing bright red blood around patient's vagina and called EMS to bring her to the ED for further evaluation. No previous history of vaginal bleeding or GI bleeding. Takes Plavix for previous stroke. Wheelchair bound from previous stroke. Patient denies abdominal pain, rectal pain, urinary symptoms, N/V/D, constipation, fever, chills, or vaginal pain. No recent trauma.  The history is provided by the patient and medical records.  Vaginal Bleeding Quality:  Bright red Severity:  Mild Onset quality:  Sudden Duration:  1 day Timing:  Intermittent Progression:  Unchanged Chronicity:  New Menstrual history:  Postmenopausal Number of pads used:  None Number of tampons used:  None Possible pregnancy: no   Context: spontaneously   Relieved by:  None tried Worsened by:  Nothing Ineffective treatments:  None tried Associated symptoms: no abdominal pain, no back pain, no dizziness, no dysuria, no fatigue, no fever, no nausea and no vaginal discharge   Risk factors: gynecological surgery        Past Medical History:  Diagnosis Date  . Anemia    h/o fibroids  . Bipolar disorder ([place])   . Depression    bipolar  . Eczema   . H/O stroke within last year 11/19/2018  . HTN (hypertension)   . Hypothyroidism   . Insomnia   . Osteoarthritis   . Osteoporosis   . Psoriasis     Patient Active Problem List   Diagnosis Date Noted  . Hyperlipidemia 08/22/2020  . Corns/callosities 08/22/2020  . Do not  resuscitate 05/22/2019  . Dry skin dermatitis 05/22/2019  . Right hemiparesis ([place])   . Peripheral edema   . HTN (hypertension)   . Labile blood pressure   . Hypokalemia   . 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 H/O: stroke 11/19/2018  . Stroke ([place]) 11/18/2018  . Stroke (cerebrum) ([place]) 11/18/2018  . Myalgia 08/06/2018  . Left lower quadrant pain 08/29/2017  . Dyspepsia 03/25/2017  . Atherosclerosis of aorta ([place]) 09/27/2016  . COPD (chronic obstructive pulmonary disease) ([place]) 09/27/2016  . Acute blood loss anemia 03/16/2015  . Osteoarthritis of right knee 03/04/2015  . Status post total right knee replacement 03/04/2015  . IBS 02/23/2010  . HEMORRHOIDS, EXTERNAL 01/01/2010  . HEMATURIA UNSPECIFIED 11/04/2009  . NAUSEA 06/02/2009  . URINARY INCONTINENCE, STRESS, FEMALE 05/09/2009  . UTI 04/23/2008  . COLONIC POLYPS 12/21/2007  . Bipolar disorder ([place]) 12/21/2007  . DEVIATED NASAL SEPTUM 12/21/2007  . HERNIA, VENTRAL 12/21/2007  . Osteoarthritis 12/21/2007  . Hypothyroidism 09/01/2007  . ECZEMA 09/01/2007  . Psoriasis 09/01/2007  . Osteoporosis 09/01/2007  . Insomnia 09/01/2007    Past Surgical History:  Procedure Laterality Date  . ABDOMINAL HYSTERECTOMY  1982  . APPENDECTOMY  2003  . BREAST LUMPECTOMY  1947   left  . CATARACT EXTRACTION  2010  . EYE SURGERY    . HERNIA REPAIR    . KNEE ARTHROSCOPY  09/07/11   right knee  . LOOP RECORDER INSERTION N/A 11/20/2018   Procedure: LOOP RECORDER  INSERTION;  Surgeon: Evans Lance, MD;  Location: [place] [facility];  Service: Cardiovascular;  Laterality: N/A;  . LOOP RECORDER INSERTION  11/20/2018   Procedure: LOOP RECORDER INSERTION;  Surgeon: Jerline Pain, MD;  Location: [place];  Service: Cardiovascular;;  . NASAL SEPTUM SURGERY  1970's  . TEE WITHOUT CARDIOVERSION N/A 11/20/2018   Procedure: TRANSESOPHAGEAL ECHOCARDIOGRAM (TEE);  Surgeon: Jerline Pain, MD;  Location: [facility] ENDOSCOPY;  Service:  Cardiovascular;  Laterality: N/A;  . TONSILLECTOMY  1970's  . TONSILLECTOMY    . TOTAL KNEE ARTHROPLASTY Right 03/04/2015   Procedure: RIGHT TOTAL KNEE ARTHROPLASTY;  Surgeon: Mcarthur Rossetti, MD;  Location: [place];  Service: Orthopedics;  Laterality: Right;  . VENTRAL HERNIA REPAIR  2005     OB History   No obstetric history on file.     Family History  Problem Relation Age of Onset  . Colon cancer Brother   . Lung cancer Father   . Prostate cancer Father   . Arthritis Father   . Cancer Father        lung, prostate  . Heart disease Brother   . Cancer Brother        lung  . Lung cancer Brother   . Aortic aneurysm Brother   . Cancer Other        colon    Social History   Tobacco Use  . Smoking status: Former Smoker    Quit date: 09/29/1985    Years since quitting: 35.2  . Smokeless tobacco: Never Used  Substance Use Topics  . Alcohol use: No  . Drug use: No    Home Medications Prior to Admission medications   Medication Sig Start Date End Date Taking? Authorizing Provider  amLODipine (NORVASC) 5 MG tablet Take 1 tablet (5 mg total) by mouth daily. 04/05/19   Mosie Lukes, MD  ARIPiprazole (ABILIFY) 5 MG tablet Take 1 tablet (5 mg total) by mouth daily. 12/05/18   Angiulli, Lavon Paganini, PA-C  aspirin EC 81 MG EC tablet Take 1 tablet (81 mg total) by mouth daily. 11/20/18   Danford, Suann Larry, MD  atorvastatin (LIPITOR) 40 MG tablet Take 1 tablet (40 mg total) by mouth daily at 6 PM. 11/20/18   Danford, Suann Larry, MD  cholecalciferol (VITAMIN D) 1000 UNITS tablet Take 3,000 Units by mouth daily.     [provider]  ciprofloxacin (CIPRO) 250 MG tablet Take 1 tablet (250 mg total) by mouth 2 (two) times daily. 03/12/20   Saguier, Percell Miller, PA-C  clopidogrel (PLAVIX) 75 MG tablet Take 1 tablet (75 mg total) by mouth daily. 11/20/18   Danford, Suann Larry, MD  furosemide (LASIX) 20 MG tablet Take 1 tablet (20 mg total) by mouth daily. 12/06/18   Angiulli,  Lavon Paganini, PA-C  levothyroxine (SYNTHROID) 88 MCG tablet TAKE 1 TABLET BY MOUTH EVERY DAY 08/20/19   Mosie Lukes, MD  Melatonin 5 MG TABS Take 3 tablets (15 mg total) by mouth at bedtime. 12/05/18   Angiulli, Lavon Paganini, PA-C  multivitamin (PROSIGHT) TABS tablet Take 1 tablet by mouth daily. 12/06/18   Angiulli, Lavon Paganini, PA-C  potassium chloride SA (K-DUR,KLOR-CON) 20 MEQ tablet Take 1 tablet (20 mEq total) by mouth daily. 12/05/18   Angiulli, Lavon Paganini, PA-C  senna-docusate (SENOKOT-S) 8.6-50 MG tablet Take 2 tablets by mouth 2 (two) times daily. 12/05/18   Angiulli, Lavon Paganini, PA-C  Triamcinolone Acetonide (TRIAMCINOLONE 0.1 % CREAM : EUCERIN) CREA Apply 1 application  topically daily as needed. 05/25/19   Mosie Lukes, MD  triamcinolone cream (KENALOG) 0.1 % Apply 1 application topically daily as needed. 05/22/19   Mosie Lukes, MD    Allergies    Bupropion hcl  Review of Systems   Review of Systems  Constitutional: Negative for chills, fatigue and fever.  HENT: Negative for ear pain and sore throat.   Eyes: Negative for pain and visual disturbance.  Respiratory: Negative for cough and shortness of breath.   Cardiovascular: Negative for chest pain and palpitations.  Gastrointestinal: Negative for abdominal pain, nausea and vomiting.  Genitourinary: Positive for vaginal bleeding. Negative for dysuria, hematuria and vaginal discharge.  Musculoskeletal: Negative for arthralgias and back pain.  Skin: Negative for color change and rash.  Neurological: Negative for dizziness, seizures and syncope.  All other systems reviewed and are negative.   Physical Exam Updated Vital Signs BP (!) 182/89   Pulse 89   Resp 12   SpO2 96%   Physical Exam Vitals and nursing note reviewed. Exam conducted with a chaperone present.  Constitutional:      General: She is awake. She is not in acute distress.    Appearance: Normal appearance. She is well-developed and well-groomed. She is not ill-appearing.   HENT:     Head: Normocephalic and atraumatic.     Right Ear: External ear normal.     Left Ear: External ear normal.     Nose: Nose normal.  Eyes:     General: No scleral icterus.       Right eye: No discharge.        Left eye: No discharge.     Conjunctiva/sclera: Conjunctivae normal.  Cardiovascular:     Rate and Rhythm: Normal rate and regular rhythm.  Pulmonary:     Effort: Pulmonary effort is normal. No respiratory distress.  Abdominal:     General: Abdomen is flat. There is no distension.     Palpations: Abdomen is soft.     Tenderness: There is no abdominal tenderness. There is no guarding or rebound.     Hernia: There is no hernia in the left inguinal area or right inguinal area.  Genitourinary:    General: Normal vulva.     Labia:        Right: No rash.        Left: No rash.      Comments: Dark blood noted to vaginal orifice and on bimanual exam with no active bleeding. Skin:    General: Skin is warm and dry.     Findings: No rash.  Neurological:     Mental Status: She is alert and oriented to person, place, and time.     GCS: GCS eye subscore is 4. GCS verbal subscore is 5. GCS motor subscore is 6.  Psychiatric:        Mood and Affect: Mood normal.        Behavior: Behavior normal. Behavior is cooperative.     ED Results / Procedures / Treatments   Labs (all labs ordered are listed, but only abnormal results are displayed) Labs Reviewed  CBC WITH DIFFERENTIAL/PLATELET - Abnormal; Notable for the following components:      Result Value   RBC 3.81 (*)    Hemoglobin 11.4 (*)    Abs Immature Granulocytes 0.08 (*)    All other components within normal limits  BASIC METABOLIC PANEL - Abnormal; Notable for the following components:   Potassium 3.4 (*)  Glucose, Bld 112 (*)    All other components within normal limits  URINE CULTURE  PROTIME-INR  URINALYSIS, ROUTINE W REFLEX MICROSCOPIC    EKG None  Radiology No results  found.  Procedures Procedures  Medications Ordered in ED Medications - No data to display  ED Course  I have reviewed the triage vital signs and the nursing notes.  Pertinent labs & imaging results that were available during my care of the patient were reviewed by me and considered in my medical decision making (see chart for details).    MDM Rules/Calculators/A&P                          Patient is a 87yoF w/ history and physical as described above who presents to the ED for suspected vaginal bleeding. VS reassuring and HDS. Patient resting comfortably and in no acute distress. Physical exam findings as described above. No concern for active vaginal bleeding at this time. Will obtain CBC, BMP, and UA.  Hgb 11.6 stable (12.6 on 05/30/20). No leukocytosis. BMP unremarkable. UA pending. Low suspicion for acute hemorrhage. Etiology possibly 2/2 vaginal atrophy, UTI, possible underlying malignancy. No FB appreciated on bimanual exam. No previous history of vaginal bleeding, easy bleeding, or easy bruising making underlying coagulopathy unlikely. Plan at time of sign out will be to follow up urine. Recommend Gyn follow up outpatient for further evaluation of bleeding and pelvic US. Patient otherwise HDS with no acute events under my care.  Transfer of care at 2330 to [facility]. Please refer to her note for further MDM and ultimate disposition. Patient in stable condition at time of transfer.  Final Clinical Impression(s) / ED Diagnoses Final diagnoses:  Vaginal bleeding    Rx / DC Orders ED Discharge Orders    None         Christy Gentles, MD 12/15/20 Quentin Mulling    Noemi Chapel, MD 12/16/20 1029

## 2020-12-14 NOTE — Discharge Instructions (Addendum)
1. Medications: Keflex, usual home medications 2. Treatment: rest, drink plenty of fluids 3. Follow Up: Please followup with your primary doctor in 2-3 days for discussion of your diagnoses and further evaluation after today's visit; Please return to the ER for new or worsening symptoms

## 2020-12-14 NOTE — ED Provider Notes (Signed)
Care assumed from Dr. Lanny Hurst.  Please see her full H&P.  In short,  [name] [name] is a 87 y.o. female presents for vaginal bleeding. Per initial provider on exam no rectal bleeding; trance blood on the bimanual; Hgb stable.  Pt has a hx hysterectomy.   Plan: D/c after urine  Physical Exam  BP (!) 162/100   Pulse 89   Temp 98 F (36.7 C) (Oral)   Resp 15   SpO2 97%   Physical Exam Vitals and nursing note reviewed.  Constitutional:      General: She is not in acute distress.    Appearance: She is well-developed and well-nourished.  HENT:     Head: Normocephalic.  Eyes:     General: No scleral icterus.    Conjunctiva/sclera: Conjunctivae normal.  Cardiovascular:     Rate and Rhythm: Normal rate.     Pulses: Intact distal pulses.  Pulmonary:     Effort: Pulmonary effort is normal.  Musculoskeletal:        General: Normal range of motion.     Cervical back: Normal range of motion.  Skin:    General: Skin is warm and dry.  Neurological:     Mental Status: She is alert.     ED Course/Procedures   Clinical Course as of 12/15/20 0422  Mon Dec 15, 2020  0048 RN attempted in and out cath.  Reports tube filled immediately with blood and clots.  Patient urinated afterwards and were unable to collect sample.  Given gross hematuria, suspect urinary tract infection.  Will give Rocephin and small fluid bolus.  Will attempt repeat collection for urine culture. [HM]    Clinical Course User Index [HM] Muthersbaugh, Gwenlyn Perking    Procedures   Results for orders placed or performed during the hospital encounter of 12/14/20  CBC with Differential  Result Value Ref Range   WBC 10.5 4.0 - 10.5 K/uL   RBC 3.81 (L) 3.87 - 5.11 MIL/uL   Hemoglobin 11.4 (L) 12.0 - 15.0 g/dL   HCT 36.6 36.0 - 46.0 %   MCV 96.1 80.0 - 100.0 fL   MCH 29.9 26.0 - 34.0 pg   MCHC 31.1 30.0 - 36.0 g/dL   RDW 13.4 11.5 - 15.5 %   Platelets 283 150 - 400 K/uL   nRBC 0.0 0.0 - 0.2 %   Neutrophils  Relative % 65 %   Neutro Abs 6.9 1.7 - 7.7 K/uL   Lymphocytes Relative 24 %   Lymphs Abs 2.5 0.7 - 4.0 K/uL   Monocytes Relative 9 %   Monocytes Absolute 0.9 0.1 - 1.0 K/uL   Eosinophils Relative 0 %   Eosinophils Absolute 0.0 0.0 - 0.5 K/uL   Basophils Relative 1 %   Basophils Absolute 0.1 0.0 - 0.1 K/uL   Immature Granulocytes 1 %   Abs Immature Granulocytes 0.08 (H) 0.00 - 0.07 K/uL  Basic metabolic panel  Result Value Ref Range   Sodium 139 135 - 145 mmol/L   Potassium 3.4 (L) 3.5 - 5.1 mmol/L   Chloride 104 98 - 111 mmol/L   CO2 25 22 - 32 mmol/L   Glucose, Bld 112 (H) 70 - 99 mg/dL   BUN 17 8 - 23 mg/dL   Creatinine, Ser 0.86 0.44 - 1.00 mg/dL   Calcium 9.0 8.9 - 10.3 mg/dL   GFR, Estimated >60 >60 mL/min   Anion gap 10 5 - 15  Protime-INR  Result Value Ref Range   Prothrombin  Time 12.3 11.4 - 15.2 seconds   INR 1.0 0.8 - 1.2  Urinalysis, Routine w reflex microscopic  Result Value Ref Range   Color, Urine YELLOW YELLOW   APPearance HAZY (A) CLEAR   Specific Gravity, Urine 1.006 1.005 - 1.030   pH 8.0 5.0 - 8.0   Glucose, UA NEGATIVE NEGATIVE mg/dL   Hgb urine dipstick LARGE (A) NEGATIVE   Bilirubin Urine NEGATIVE NEGATIVE   Ketones, ur NEGATIVE NEGATIVE mg/dL   Protein, ur NEGATIVE NEGATIVE mg/dL   Nitrite NEGATIVE NEGATIVE   Leukocytes,Ua NEGATIVE NEGATIVE   RBC / HPF >50 (H) 0 - 5 RBC/hpf   WBC, UA 0-5 0 - 5 WBC/hpf   Bacteria, UA RARE (A) NONE SEEN   Squamous Epithelial / LPF 0-5 0 - 5   Mucus PRESENT    No results found.   MDM   Patient here with complaints of vaginal bleeding however on further exam it appears the blood is most likely coming from patient's urethra.  She is well-appearing.  Rocephin given here in the emergency department.  We will plan to treat for UTI however she will need urology follow-up if this does not clear with antibiotics.  Urine culture pending.   Vaginal bleeding  Hematuria, unspecified type          Agapito Games 12/15/20 0427    Ripley Fraise, MD 12/15/20 [phone]

## 2020-12-15 DIAGNOSIS — R279 Unspecified lack of coordination: Secondary | ICD-10-CM | POA: Diagnosis not present

## 2020-12-15 DIAGNOSIS — N939 Abnormal uterine and vaginal bleeding, unspecified: Secondary | ICD-10-CM | POA: Diagnosis not present

## 2020-12-15 DIAGNOSIS — R58 Hemorrhage, not elsewhere classified: Secondary | ICD-10-CM | POA: Diagnosis not present

## 2020-12-15 DIAGNOSIS — Z743 Need for continuous supervision: Secondary | ICD-10-CM | POA: Diagnosis not present

## 2020-12-15 DIAGNOSIS — F29 Unspecified psychosis not due to a substance or known physiological condition: Secondary | ICD-10-CM | POA: Diagnosis not present

## 2020-12-15 DIAGNOSIS — R5381 Other malaise: Secondary | ICD-10-CM | POA: Diagnosis not present

## 2020-12-15 LAB — URINALYSIS, ROUTINE W REFLEX MICROSCOPIC
Bilirubin Urine: NEGATIVE
Glucose, UA: NEGATIVE mg/dL
Ketones, ur: NEGATIVE mg/dL
Leukocytes,Ua: NEGATIVE
Nitrite: NEGATIVE
Protein, ur: NEGATIVE mg/dL
RBC / HPF: >50 RBC/hpf — ABNORMAL HIGH (ref 0–5)
Specific Gravity, Urine: 1.006 (ref 1.005–1.030)
pH: 8 (ref 5.0–8.0)

## 2020-12-15 MED ORDER — SODIUM CHLORIDE 0.9 % IV BOLUS
250.0000 mL | Freq: Once | INTRAVENOUS | Status: AC
  Administered 2020-12-15: 250 mL via INTRAVENOUS

## 2020-12-15 MED ORDER — CEPHALEXIN 500 MG PO CAPS: ORAL_CAPSULE | ORAL | 0 refills | Status: DC

## 2020-12-15 MED ORDER — ONDANSETRON HCL 4 MG/2ML IJ SOLN: 4.0000 mg | Freq: Once | INTRAMUSCULAR | Status: DC

## 2020-12-15 MED ORDER — SODIUM CHLORIDE 0.9 % IV SOLN
1.0000 g | Freq: Once | INTRAVENOUS | Status: AC
  Administered 2020-12-15: 1 g via INTRAVENOUS
  Filled 2020-12-15: qty 10

## 2020-12-15 NOTE — ED Notes (Signed)
[facility] at bedside at this time.

## 2020-12-15 NOTE — ED Notes (Signed)
Pt son [name]) made aware of pt diagnosis and discharge back to facility.

## 2020-12-15 NOTE — ED Notes (Signed)
Alana at [facility] made aware of pt discharge and provided update.

## 2020-12-15 NOTE — ED Notes (Signed)
This RN attempted In & Out catheter per order, unable to obtain urine due tube being gross hematuria with visible clots noted in brief. [place], [place] made aware at this time pt placed on external catheter.

## 2020-12-15 NOTE — ED Notes (Signed)
Pt reports vomiting at post antibiotics beginning, [place], [place] to be made aware.

## 2020-12-16 LAB — URINE CULTURE

## 2020-12-17 DIAGNOSIS — I10 Essential (primary) hypertension: Secondary | ICD-10-CM | POA: Diagnosis not present

## 2020-12-17 DIAGNOSIS — J449 Chronic obstructive pulmonary disease, unspecified: Secondary | ICD-10-CM | POA: Diagnosis not present

## 2020-12-17 DIAGNOSIS — I700 Atherosclerosis of aorta: Secondary | ICD-10-CM | POA: Diagnosis not present

## 2020-12-17 DIAGNOSIS — F319 Bipolar disorder, unspecified: Secondary | ICD-10-CM | POA: Diagnosis not present

## 2020-12-17 DIAGNOSIS — Z87891 Personal history of nicotine dependence: Secondary | ICD-10-CM | POA: Diagnosis not present

## 2020-12-17 DIAGNOSIS — M150 Primary generalized (osteo)arthritis: Secondary | ICD-10-CM | POA: Diagnosis not present

## 2020-12-19 DIAGNOSIS — M150 Primary generalized (osteo)arthritis: Secondary | ICD-10-CM | POA: Diagnosis not present

## 2020-12-19 DIAGNOSIS — Z87891 Personal history of nicotine dependence: Secondary | ICD-10-CM | POA: Diagnosis not present

## 2020-12-19 DIAGNOSIS — I10 Essential (primary) hypertension: Secondary | ICD-10-CM | POA: Diagnosis not present

## 2020-12-19 DIAGNOSIS — J449 Chronic obstructive pulmonary disease, unspecified: Secondary | ICD-10-CM | POA: Diagnosis not present

## 2020-12-19 DIAGNOSIS — F319 Bipolar disorder, unspecified: Secondary | ICD-10-CM | POA: Diagnosis not present

## 2020-12-19 DIAGNOSIS — I700 Atherosclerosis of aorta: Secondary | ICD-10-CM | POA: Diagnosis not present

## 2020-12-22 DIAGNOSIS — F3161 Bipolar disorder, current episode mixed, mild: Secondary | ICD-10-CM | POA: Diagnosis not present

## 2020-12-23 DIAGNOSIS — I700 Atherosclerosis of aorta: Secondary | ICD-10-CM | POA: Diagnosis not present

## 2020-12-23 DIAGNOSIS — J449 Chronic obstructive pulmonary disease, unspecified: Secondary | ICD-10-CM | POA: Diagnosis not present

## 2020-12-23 DIAGNOSIS — I10 Essential (primary) hypertension: Secondary | ICD-10-CM | POA: Diagnosis not present

## 2020-12-23 DIAGNOSIS — Z87891 Personal history of nicotine dependence: Secondary | ICD-10-CM | POA: Diagnosis not present

## 2020-12-23 DIAGNOSIS — M150 Primary generalized (osteo)arthritis: Secondary | ICD-10-CM | POA: Diagnosis not present

## 2020-12-23 DIAGNOSIS — Z20822 Contact with and (suspected) exposure to covid-19: Secondary | ICD-10-CM | POA: Diagnosis not present

## 2020-12-23 DIAGNOSIS — F319 Bipolar disorder, unspecified: Secondary | ICD-10-CM | POA: Diagnosis not present

## 2020-12-25 DIAGNOSIS — I10 Essential (primary) hypertension: Secondary | ICD-10-CM | POA: Diagnosis not present

## 2020-12-25 DIAGNOSIS — J449 Chronic obstructive pulmonary disease, unspecified: Secondary | ICD-10-CM | POA: Diagnosis not present

## 2020-12-25 DIAGNOSIS — M150 Primary generalized (osteo)arthritis: Secondary | ICD-10-CM | POA: Diagnosis not present

## 2020-12-25 DIAGNOSIS — I700 Atherosclerosis of aorta: Secondary | ICD-10-CM | POA: Diagnosis not present

## 2020-12-25 DIAGNOSIS — F319 Bipolar disorder, unspecified: Secondary | ICD-10-CM | POA: Diagnosis not present

## 2020-12-25 DIAGNOSIS — Z87891 Personal history of nicotine dependence: Secondary | ICD-10-CM | POA: Diagnosis not present

## 2020-12-29 DIAGNOSIS — F319 Bipolar disorder, unspecified: Secondary | ICD-10-CM | POA: Diagnosis not present

## 2020-12-29 DIAGNOSIS — Z7902 Long term (current) use of antithrombotics/antiplatelets: Secondary | ICD-10-CM | POA: Diagnosis not present

## 2020-12-29 DIAGNOSIS — I10 Essential (primary) hypertension: Secondary | ICD-10-CM | POA: Diagnosis not present

## 2020-12-29 DIAGNOSIS — Z87891 Personal history of nicotine dependence: Secondary | ICD-10-CM | POA: Diagnosis not present

## 2020-12-29 DIAGNOSIS — Z7982 Long term (current) use of aspirin: Secondary | ICD-10-CM | POA: Diagnosis not present

## 2020-12-29 DIAGNOSIS — J449 Chronic obstructive pulmonary disease, unspecified: Secondary | ICD-10-CM | POA: Diagnosis not present

## 2020-12-29 DIAGNOSIS — I700 Atherosclerosis of aorta: Secondary | ICD-10-CM | POA: Diagnosis not present

## 2020-12-29 DIAGNOSIS — M150 Primary generalized (osteo)arthritis: Secondary | ICD-10-CM | POA: Diagnosis not present

## 2020-12-30 DIAGNOSIS — M150 Primary generalized (osteo)arthritis: Secondary | ICD-10-CM | POA: Diagnosis not present

## 2020-12-30 DIAGNOSIS — F319 Bipolar disorder, unspecified: Secondary | ICD-10-CM | POA: Diagnosis not present

## 2020-12-30 DIAGNOSIS — Z87891 Personal history of nicotine dependence: Secondary | ICD-10-CM | POA: Diagnosis not present

## 2020-12-30 DIAGNOSIS — I700 Atherosclerosis of aorta: Secondary | ICD-10-CM | POA: Diagnosis not present

## 2020-12-30 DIAGNOSIS — I10 Essential (primary) hypertension: Secondary | ICD-10-CM | POA: Diagnosis not present

## 2020-12-30 DIAGNOSIS — J449 Chronic obstructive pulmonary disease, unspecified: Secondary | ICD-10-CM | POA: Diagnosis not present

## 2021-01-01 DIAGNOSIS — I10 Essential (primary) hypertension: Secondary | ICD-10-CM | POA: Diagnosis not present

## 2021-01-01 DIAGNOSIS — F319 Bipolar disorder, unspecified: Secondary | ICD-10-CM | POA: Diagnosis not present

## 2021-01-01 DIAGNOSIS — M150 Primary generalized (osteo)arthritis: Secondary | ICD-10-CM | POA: Diagnosis not present

## 2021-01-01 DIAGNOSIS — I700 Atherosclerosis of aorta: Secondary | ICD-10-CM | POA: Diagnosis not present

## 2021-01-01 DIAGNOSIS — J449 Chronic obstructive pulmonary disease, unspecified: Secondary | ICD-10-CM | POA: Diagnosis not present

## 2021-01-01 DIAGNOSIS — Z87891 Personal history of nicotine dependence: Secondary | ICD-10-CM | POA: Diagnosis not present

## 2021-01-07 DIAGNOSIS — M150 Primary generalized (osteo)arthritis: Secondary | ICD-10-CM | POA: Diagnosis not present

## 2021-01-07 DIAGNOSIS — K5900 Constipation, unspecified: Secondary | ICD-10-CM | POA: Diagnosis not present

## 2021-01-07 DIAGNOSIS — I700 Atherosclerosis of aorta: Secondary | ICD-10-CM | POA: Diagnosis not present

## 2021-01-07 DIAGNOSIS — F319 Bipolar disorder, unspecified: Secondary | ICD-10-CM | POA: Diagnosis not present

## 2021-01-07 DIAGNOSIS — Z87891 Personal history of nicotine dependence: Secondary | ICD-10-CM | POA: Diagnosis not present

## 2021-01-07 DIAGNOSIS — I10 Essential (primary) hypertension: Secondary | ICD-10-CM | POA: Diagnosis not present

## 2021-01-07 DIAGNOSIS — G4700 Insomnia, unspecified: Secondary | ICD-10-CM | POA: Diagnosis not present

## 2021-01-07 DIAGNOSIS — J449 Chronic obstructive pulmonary disease, unspecified: Secondary | ICD-10-CM | POA: Diagnosis not present

## 2021-01-08 ENCOUNTER — Telehealth: Payer: Self-pay | Admitting: Family Medicine

## 2021-01-08 DIAGNOSIS — I10 Essential (primary) hypertension: Secondary | ICD-10-CM | POA: Diagnosis not present

## 2021-01-08 DIAGNOSIS — I700 Atherosclerosis of aorta: Secondary | ICD-10-CM | POA: Diagnosis not present

## 2021-01-08 DIAGNOSIS — M150 Primary generalized (osteo)arthritis: Secondary | ICD-10-CM | POA: Diagnosis not present

## 2021-01-08 NOTE — Telephone Encounter (Signed)
Caller: Rosetta Posner  Call Back @ [phone], ext [phone]   [name] Canes is reporting a right lower leg bruise found on patient, patient does not complain of any pain.

## 2021-01-09 NOTE — Telephone Encounter (Signed)
Reporting bruise

## 2021-01-12 DIAGNOSIS — F319 Bipolar disorder, unspecified: Secondary | ICD-10-CM | POA: Diagnosis not present

## 2021-01-12 DIAGNOSIS — I10 Essential (primary) hypertension: Secondary | ICD-10-CM | POA: Diagnosis not present

## 2021-01-12 DIAGNOSIS — J449 Chronic obstructive pulmonary disease, unspecified: Secondary | ICD-10-CM | POA: Diagnosis not present

## 2021-01-12 DIAGNOSIS — I700 Atherosclerosis of aorta: Secondary | ICD-10-CM | POA: Diagnosis not present

## 2021-01-12 DIAGNOSIS — Z87891 Personal history of nicotine dependence: Secondary | ICD-10-CM | POA: Diagnosis not present

## 2021-01-12 DIAGNOSIS — M150 Primary generalized (osteo)arthritis: Secondary | ICD-10-CM | POA: Diagnosis not present

## 2021-01-19 DIAGNOSIS — I10 Essential (primary) hypertension: Secondary | ICD-10-CM | POA: Diagnosis not present

## 2021-01-19 DIAGNOSIS — J449 Chronic obstructive pulmonary disease, unspecified: Secondary | ICD-10-CM | POA: Diagnosis not present

## 2021-01-19 DIAGNOSIS — Z87891 Personal history of nicotine dependence: Secondary | ICD-10-CM | POA: Diagnosis not present

## 2021-01-19 DIAGNOSIS — F319 Bipolar disorder, unspecified: Secondary | ICD-10-CM | POA: Diagnosis not present

## 2021-01-19 DIAGNOSIS — M150 Primary generalized (osteo)arthritis: Secondary | ICD-10-CM | POA: Diagnosis not present

## 2021-01-19 DIAGNOSIS — I700 Atherosclerosis of aorta: Secondary | ICD-10-CM | POA: Diagnosis not present

## 2021-01-20 DIAGNOSIS — F5105 Insomnia due to other mental disorder: Secondary | ICD-10-CM | POA: Diagnosis not present

## 2021-01-20 DIAGNOSIS — F3161 Bipolar disorder, current episode mixed, mild: Secondary | ICD-10-CM | POA: Diagnosis not present

## 2021-02-11 DIAGNOSIS — I10 Essential (primary) hypertension: Secondary | ICD-10-CM | POA: Diagnosis not present

## 2021-02-11 DIAGNOSIS — K5900 Constipation, unspecified: Secondary | ICD-10-CM | POA: Diagnosis not present

## 2021-02-11 DIAGNOSIS — E876 Hypokalemia: Secondary | ICD-10-CM | POA: Diagnosis not present

## 2021-02-11 DIAGNOSIS — F5105 Insomnia due to other mental disorder: Secondary | ICD-10-CM | POA: Diagnosis not present

## 2021-02-17 DIAGNOSIS — F3161 Bipolar disorder, current episode mixed, mild: Secondary | ICD-10-CM | POA: Diagnosis not present

## 2021-02-17 DIAGNOSIS — F5105 Insomnia due to other mental disorder: Secondary | ICD-10-CM | POA: Diagnosis not present

## 2021-03-03 DIAGNOSIS — F5105 Insomnia due to other mental disorder: Secondary | ICD-10-CM | POA: Diagnosis not present

## 2021-03-03 DIAGNOSIS — F3161 Bipolar disorder, current episode mixed, mild: Secondary | ICD-10-CM | POA: Diagnosis not present

## 2021-03-17 DIAGNOSIS — F5105 Insomnia due to other mental disorder: Secondary | ICD-10-CM | POA: Diagnosis not present

## 2021-03-17 DIAGNOSIS — F3161 Bipolar disorder, current episode mixed, mild: Secondary | ICD-10-CM | POA: Diagnosis not present

## 2021-03-31 DIAGNOSIS — I10 Essential (primary) hypertension: Secondary | ICD-10-CM | POA: Diagnosis not present

## 2021-03-31 DIAGNOSIS — F5105 Insomnia due to other mental disorder: Secondary | ICD-10-CM | POA: Diagnosis not present

## 2021-03-31 DIAGNOSIS — K5900 Constipation, unspecified: Secondary | ICD-10-CM | POA: Diagnosis not present

## 2021-04-08 DIAGNOSIS — E559 Vitamin D deficiency, unspecified: Secondary | ICD-10-CM | POA: Diagnosis not present

## 2021-04-08 DIAGNOSIS — K5900 Constipation, unspecified: Secondary | ICD-10-CM | POA: Diagnosis not present

## 2021-04-08 DIAGNOSIS — F5105 Insomnia due to other mental disorder: Secondary | ICD-10-CM | POA: Diagnosis not present

## 2021-04-08 DIAGNOSIS — I10 Essential (primary) hypertension: Secondary | ICD-10-CM | POA: Diagnosis not present

## 2021-04-08 DIAGNOSIS — E876 Hypokalemia: Secondary | ICD-10-CM | POA: Diagnosis not present

## 2021-04-14 DIAGNOSIS — F3161 Bipolar disorder, current episode mixed, mild: Secondary | ICD-10-CM | POA: Diagnosis not present

## 2021-04-14 DIAGNOSIS — F5105 Insomnia due to other mental disorder: Secondary | ICD-10-CM | POA: Diagnosis not present

## 2021-04-18 ENCOUNTER — Emergency Department (HOSPITAL_COMMUNITY)
Admission: EM | Admit: 2021-04-18 | Discharge: 2021-04-19 | Disposition: A | Discharge status: Home / Self Care | Payer: Medicare Other | Attending: Emergency Medicine | Admitting: Emergency Medicine

## 2021-04-18 ENCOUNTER — Other Ambulatory Visit: Payer: Self-pay

## 2021-04-18 ENCOUNTER — Emergency Department (HOSPITAL_COMMUNITY): Payer: Medicare Other

## 2021-04-18 DIAGNOSIS — Z7982 Long term (current) use of aspirin: Secondary | ICD-10-CM | POA: Insufficient documentation

## 2021-04-18 DIAGNOSIS — J209 Acute bronchitis, unspecified: Secondary | ICD-10-CM | POA: Insufficient documentation

## 2021-04-18 DIAGNOSIS — R062 Wheezing: Secondary | ICD-10-CM | POA: Diagnosis present

## 2021-04-18 DIAGNOSIS — R609 Edema, unspecified: Secondary | ICD-10-CM | POA: Diagnosis not present

## 2021-04-18 DIAGNOSIS — Z7902 Long term (current) use of antithrombotics/antiplatelets: Secondary | ICD-10-CM | POA: Insufficient documentation

## 2021-04-18 DIAGNOSIS — R0602 Shortness of breath: Secondary | ICD-10-CM | POA: Diagnosis not present

## 2021-04-18 DIAGNOSIS — Z79899 Other long term (current) drug therapy: Secondary | ICD-10-CM | POA: Insufficient documentation

## 2021-04-18 DIAGNOSIS — R059 Cough, unspecified: Secondary | ICD-10-CM | POA: Diagnosis not present

## 2021-04-18 DIAGNOSIS — Z20822 Contact with and (suspected) exposure to covid-19: Secondary | ICD-10-CM | POA: Insufficient documentation

## 2021-04-18 DIAGNOSIS — Z96651 Presence of right artificial knee joint: Secondary | ICD-10-CM | POA: Insufficient documentation

## 2021-04-18 DIAGNOSIS — Z87891 Personal history of nicotine dependence: Secondary | ICD-10-CM | POA: Diagnosis not present

## 2021-04-18 DIAGNOSIS — E039 Hypothyroidism, unspecified: Secondary | ICD-10-CM | POA: Diagnosis not present

## 2021-04-18 DIAGNOSIS — I10 Essential (primary) hypertension: Secondary | ICD-10-CM | POA: Insufficient documentation

## 2021-04-18 DIAGNOSIS — J449 Chronic obstructive pulmonary disease, unspecified: Secondary | ICD-10-CM | POA: Insufficient documentation

## 2021-04-18 DIAGNOSIS — R079 Chest pain, unspecified: Secondary | ICD-10-CM | POA: Diagnosis not present

## 2021-04-18 DIAGNOSIS — R0902 Hypoxemia: Secondary | ICD-10-CM | POA: Diagnosis not present

## 2021-04-18 LAB — COMPREHENSIVE METABOLIC PANEL
ALT: 24 U/L (ref 0–44)
AST: 27 U/L (ref 15–41)
Albumin: 3.9 g/dL (ref 3.5–5.0)
Alkaline Phosphatase: 42 U/L (ref 38–126)
Anion gap: 11 (ref 5–15)
BUN: 18 mg/dL (ref 8–23)
CO2: 26 mmol/L (ref 22–32)
Calcium: 9.2 mg/dL (ref 8.9–10.3)
Chloride: 102 mmol/L (ref 98–111)
Creatinine, Ser: 0.94 mg/dL (ref 0.44–1.00)
GFR, Estimated: 59 mL/min — ABNORMAL LOW (ref >60)
Glucose, Bld: 105 mg/dL — ABNORMAL HIGH (ref 70–99)
Potassium: 3.7 mmol/L (ref 3.5–5.1)
Sodium: 139 mmol/L (ref 135–145)
Total Bilirubin: 0.8 mg/dL (ref 0.3–1.2)
Total Protein: 7.9 g/dL (ref 6.5–8.1)

## 2021-04-18 LAB — CBC WITH DIFFERENTIAL/PLATELET
Abs Immature Granulocytes: 0.04 10*3/uL (ref 0.00–0.07)
Basophils Absolute: 0.1 10*3/uL (ref 0.0–0.1)
Basophils Relative: 1 %
Eosinophils Absolute: 0.2 10*3/uL (ref 0.0–0.5)
Eosinophils Relative: 2 %
HCT: 40.3 % (ref 36.0–46.0)
Hemoglobin: 12.8 g/dL (ref 12.0–15.0)
Immature Granulocytes: 0 %
Lymphocytes Relative: 18 %
Lymphs Abs: 1.8 10*3/uL (ref 0.7–4.0)
MCH: 30 pg (ref 26.0–34.0)
MCHC: 31.8 g/dL (ref 30.0–36.0)
MCV: 94.6 fL (ref 80.0–100.0)
Monocytes Absolute: 1.1 10*3/uL — ABNORMAL HIGH (ref 0.1–1.0)
Monocytes Relative: 11 %
Neutro Abs: 7 10*3/uL (ref 1.7–7.7)
Neutrophils Relative %: 68 %
Platelets: 285 10*3/uL (ref 150–400)
RBC: 4.26 MIL/uL (ref 3.87–5.11)
RDW: 14.1 % (ref 11.5–15.5)
WBC: 10.3 10*3/uL (ref 4.0–10.5)
nRBC: 0 % (ref 0.0–0.2)

## 2021-04-18 LAB — RESP PANEL BY RT-PCR (FLU A&B, COVID) ARPGX2
Influenza A by PCR: NEGATIVE
Influenza B by PCR: NEGATIVE
SARS Coronavirus 2 by RT PCR: NEGATIVE

## 2021-04-18 LAB — BRAIN NATRIURETIC PEPTIDE: B Natriuretic Peptide: 59.3 pg/mL (ref 0.0–100.0)

## 2021-04-18 LAB — TROPONIN I (HIGH SENSITIVITY): Troponin I (High Sensitivity): 6 ng/L (ref <18)

## 2021-04-18 LAB — LACTIC ACID, PLASMA: Lactic Acid, Venous: 1.2 mmol/L (ref 0.5–1.9)

## 2021-04-18 MED ORDER — ACETAMINOPHEN 325 MG PO TABS
650.0000 mg | ORAL_TABLET | Freq: Once | ORAL | Status: AC
  Administered 2021-04-18: 650 mg via ORAL
  Filled 2021-04-18: qty 2

## 2021-04-18 MED ORDER — GUAIFENESIN 100 MG/5ML PO LIQD: 100.0000 mg | Freq: Four times a day (QID) | ORAL | 0 refills | Status: DC | PRN

## 2021-04-18 MED ORDER — ALBUTEROL SULFATE (2.5 MG/3ML) 0.083% IN NEBU
5.0000 mg | INHALATION_SOLUTION | Freq: Once | RESPIRATORY_TRACT | Status: AC
  Administered 2021-04-18: 5 mg via RESPIRATORY_TRACT
  Filled 2021-04-18: qty 6

## 2021-04-18 MED ORDER — ALBUTEROL SULFATE HFA 108 (90 BASE) MCG/ACT IN AERS
4.0000 | INHALATION_SPRAY | Freq: Once | RESPIRATORY_TRACT | Status: AC
  Administered 2021-04-18: 4 via RESPIRATORY_TRACT
  Filled 2021-04-18: qty 6.7

## 2021-04-18 NOTE — ED Provider Notes (Signed)
[facility] DEPT Provider Note   CSN: [id number] Arrival date & time: 04/18/21  1826     History Chief Complaint  Patient presents with   Aspiration    [name] [name] is a 87 y.o. female.  HPI 87 year old female presents with cough and congestion.  She has been feeling poorly since 6/29.  She states that she vomited during dinner.  After that she is been having a cough.  She does remember specifically choking or aspirating.  The cough is a dry cough but her chest feels congestion and she hears rails.  No fevers or shortness of breath.  She is getting chest pain whenever she coughs.  Her legs have been swollen as well.  Symptoms seem to be getting worse including a worsening cough so she came to the ER tonight.  Past Medical History:  Diagnosis Date   Anemia    h/o fibroids   Bipolar disorder (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) 11/18/2018   Stroke (cerebrum) ([place]) 11/18/2018   Myalgia 08/06/2018   Left lower quadrant pain 08/29/2017   Dyspepsia 03/25/2017   Atherosclerosis of aorta ([place]) 09/27/2016   COPD (chronic obstructive pulmonary disease) ([place]) 09/27/2016   Acute blood loss anemia 03/16/2015   Osteoarthritis of right knee 03/04/2015   Status post total right knee replacement 03/04/2015   IBS 02/23/2010   HEMORRHOIDS, EXTERNAL 01/01/2010   HEMATURIA UNSPECIFIED 11/04/2009   NAUSEA 06/02/2009   URINARY  INCONTINENCE, STRESS, FEMALE 05/09/2009   UTI 04/23/2008   COLONIC POLYPS 12/21/2007   Bipolar disorder ([place]) 12/21/2007   DEVIATED NASAL SEPTUM 12/21/2007   HERNIA, VENTRAL 12/21/2007   Osteoarthritis 12/21/2007   Hypothyroidism 09/01/2007   ECZEMA 09/01/2007   Psoriasis 09/01/2007   Osteoporosis 09/01/2007   Insomnia 09/01/2007    Past Surgical History:  Procedure Laterality Date   ABDOMINAL HYSTERECTOMY  1982   APPENDECTOMY  2003   BREAST LUMPECTOMY  1947   left   CATARACT EXTRACTION  2010   EYE SURGERY     HERNIA REPAIR     KNEE ARTHROSCOPY  09/07/11   right knee   LOOP RECORDER INSERTION N/A 11/20/2018   Procedure: LOOP RECORDER INSERTION;  Surgeon: Evans Lance, MD;  Location: [facility];  Service: Cardiovascular;  Laterality: N/A;   LOOP RECORDER INSERTION  11/20/2018   Procedure: LOOP RECORDER INSERTION;  Surgeon: Jerline Pain, MD;  Location: [facility];  Service: Cardiovascular;;   NASAL SEPTUM SURGERY  1970's   TEE WITHOUT CARDIOVERSION N/A 11/20/2018   Procedure: TRANSESOPHAGEAL ECHOCARDIOGRAM (TEE);  Surgeon: Jerline Pain, MD;  Location: [facility]OSCOPY;  Service: Cardiovascular;  Laterality: N/A;   TONSILLECTOMY  1970's   TONSILLECTOMY     TOTAL KNEE ARTHROPLASTY Right 03/04/2015   Procedure: RIGHT TOTAL KNEE ARTHROPLASTY;  Surgeon: Lind Guest  Ninfa Linden, MD;  Location: [facility];  Service: Orthopedics;  Laterality: Right;   VENTRAL HERNIA REPAIR  2005     OB History   No obstetric history on file.     Family History  Problem Relation Age of Onset   Colon cancer Brother    Lung cancer Father    Prostate cancer Father    Arthritis Father    Cancer Father        lung, prostate   Heart disease Brother    Cancer Brother        lung   Lung cancer Brother    Aortic aneurysm Brother    Cancer Other        colon    Social History   Tobacco Use   Smoking status: Former    Pack years: 0.00    Types: Cigarettes    Quit date: 09/29/1985     Years since quitting: 35.5   Smokeless tobacco: Never  Substance Use Topics   Alcohol use: No   Drug use: No    Home Medications Prior to Admission medications   Medication Sig Start Date End Date Taking? Authorizing Provider  amLODipine (NORVASC) 5 MG tablet Take 1 tablet (5 mg total) by mouth daily. 04/05/19   Mosie Lukes, MD  ARIPiprazole (ABILIFY) 5 MG tablet Take 1 tablet (5 mg total) by mouth daily. 12/05/18   Angiulli, Lavon Paganini, PA-C  aspirin EC 81 MG EC tablet Take 1 tablet (81 mg total) by mouth daily. 11/20/18   Danford, Suann Larry, MD  atorvastatin (LIPITOR) 40 MG tablet Take 1 tablet (40 mg total) by mouth daily at 6 PM. 11/20/18   Danford, Suann Larry, MD  cephALEXin (KEFLEX) 500 MG capsule 1 cap po bid x 7 days 12/15/20   Muthersbaugh, Jarrett Soho, PA-C  cholecalciferol (VITAMIN D) 1000 UNITS tablet Take 3,000 Units by mouth daily.     [provider]  ciprofloxacin (CIPRO) 250 MG tablet Take 1 tablet (250 mg total) by mouth 2 (two) times daily. 03/12/20   Saguier, Percell Miller, PA-C  clopidogrel (PLAVIX) 75 MG tablet Take 1 tablet (75 mg total) by mouth daily. 11/20/18   Danford, Suann Larry, MD  furosemide (LASIX) 20 MG tablet Take 1 tablet (20 mg total) by mouth daily. 12/06/18   Angiulli, Lavon Paganini, PA-C  levothyroxine (SYNTHROID) 88 MCG tablet TAKE 1 TABLET BY MOUTH EVERY DAY 08/20/19   Mosie Lukes, MD  Melatonin 5 MG TABS Take 3 tablets (15 mg total) by mouth at bedtime. 12/05/18   Angiulli, Lavon Paganini, PA-C  multivitamin (PROSIGHT) TABS tablet Take 1 tablet by mouth daily. 12/06/18   Angiulli, Lavon Paganini, PA-C  potassium chloride SA (K-DUR,KLOR-CON) 20 MEQ tablet Take 1 tablet (20 mEq total) by mouth daily. 12/05/18   Angiulli, Lavon Paganini, PA-C  senna-docusate (SENOKOT-S) 8.6-50 MG tablet Take 2 tablets by mouth 2 (two) times daily. 12/05/18   Angiulli, Lavon Paganini, PA-C  Triamcinolone Acetonide (TRIAMCINOLONE 0.1 % CREAM : EUCERIN) CREA Apply 1 application topically daily as needed.  05/25/19   Mosie Lukes, MD  triamcinolone cream (KENALOG) 0.1 % Apply 1 application topically daily as needed. 05/22/19   Mosie Lukes, MD    Allergies    Bupropion hcl  Review of Systems   Review of Systems  Constitutional:  Negative for fever.  Respiratory:  Positive for cough and shortness of breath.   Cardiovascular:  Positive for chest pain and leg swelling.  All other systems reviewed  and are negative.  Physical Exam Updated Vital Signs BP (!) 135/91 (BP Location: Right Arm)   Pulse 99   Temp 99.3 F (37.4 C) (Oral)   Resp 18   Ht 5\' 3"  (1.6 m)   Wt 68 kg   SpO2 95%   BMI 26.57 kg/m   Physical Exam Vitals and nursing note reviewed.  Constitutional:      General: She is not in acute distress.    Appearance: She is well-developed. She is not ill-appearing or diaphoretic.  HENT:     Head: Normocephalic and atraumatic.     Right Ear: External ear normal.     Left Ear: External ear normal.     Nose: Nose normal.  Eyes:     General:        Right eye: No discharge.        Left eye: No discharge.  Cardiovascular:     Rate and Rhythm: Normal rate and regular rhythm.     Heart sounds: Normal heart sounds.  Pulmonary:     Effort: Pulmonary effort is normal. No tachypnea, accessory muscle usage or respiratory distress.     Breath sounds: Wheezing (diffuse, expiratory) present.  Abdominal:     Palpations: Abdomen is soft.     Tenderness: There is no abdominal tenderness.  Musculoskeletal:     Right lower leg: Edema present.     Left lower leg: Edema present.  Skin:    General: Skin is warm and dry.  Neurological:     Mental Status: She is alert.  Psychiatric:        Mood and Affect: Mood is not anxious.    ED Results / Procedures / Treatments   Labs (all labs ordered are listed, but only abnormal results are displayed) Labs Reviewed  COMPREHENSIVE METABOLIC PANEL - Abnormal; Notable for the following components:      Result Value   Glucose, Bld 105 (*)     GFR, Estimated 59 (*)    All other components within normal limits  CBC WITH DIFFERENTIAL/PLATELET - Abnormal; Notable for the following components:   Monocytes Absolute 1.1 (*)    All other components within normal limits  RESP PANEL BY RT-PCR (FLU A&B, COVID) ARPGX2  CULTURE, BLOOD (ROUTINE X 2)  CULTURE, BLOOD (ROUTINE X 2)  LACTIC ACID, PLASMA  BRAIN NATRIURETIC PEPTIDE  TROPONIN I (HIGH SENSITIVITY)    EKG EKG Interpretation  Date/Time:  Saturday April 18 2021 19:35:10 EDT Ventricular Rate:  99 PR Interval:    QRS Duration: 84 QT Interval:  346 QTC Calculation: 444 R Axis:   55 Text Interpretation: Sinus rhythm Anterior infarct, old Borderline repolarization abnormality Interpretation limited secondary to artifact Confirmed by Sherwood Gambler [phone]) on 04/18/2021 8:05:55 PM  Radiology DG Chest 2 View  Result Date: 04/18/2021 CLINICAL DATA:  Aspiration on Wednesday. Cough, congestion, shortness of breath. EXAM: CHEST - 2 VIEW COMPARISON:  03/12/2020 FINDINGS: Heart size and pulmonary vascularity are normal. Lungs are clear. No pleural effusions. No pneumothorax. A loop recorder is present. Calcified and tortuous aorta. Degenerative changes in the spine and shoulders. IMPRESSION: No active cardiopulmonary disease. Electronically Signed   By: Lucienne Capers M.D.   On: 04/18/2021 20:30    Procedures Procedures   Medications Ordered in ED Medications  albuterol (VENTOLIN HFA) 108 (90 Base) MCG/ACT inhaler 4 puff (has no administration in time range)  acetaminophen (TYLENOL) tablet 650 mg (has no administration in time range)    ED Course  I  have reviewed the triage vital signs and the nursing notes.  Pertinent labs & imaging results that were available during my care of the patient were reviewed by me and considered in my medical decision making (see chart for details).    MDM Rules/Calculators/A&P                          Presentation is most consistent with  bronchitis.  There was no obvious aspiration episode that is certainly not a consideration as well.  However there is no overt pneumonia and in his clinical setting I do not think antibiotics are warranted.  She seems to be doing better with albuterol and especially after the albuterol nebulizer.  She does not carry an asthma/COPD history so I do not think she needs steroids necessarily.  At this point, she will be given her albuterol inhaler and discharged home to follow-up with PCP as needed.  Return precautions given. Final Clinical Impression(s) / ED Diagnoses Final diagnoses:  Acute bronchitis, unspecified organism    Rx / DC Orders ED Discharge Orders     None        Sherwood Gambler, MD 04/18/21 2232

## 2021-04-18 NOTE — ED Notes (Signed)
[facility] called for transport.  

## 2021-04-18 NOTE — ED Triage Notes (Signed)
Patient brought in by [facility] from [facility], patient unable to walk due to deficits from previous  CVA. Patient aspirated on food and emesis on Wednesday. Facility concerned patient my have pneumonia. Patient denies pain.

## 2021-04-18 NOTE — Discharge Instructions (Addendum)
Use the albuterol inhaler given to 2 puffs every 4 hours as needed for cough or shortness of breath.  If you develop high fever, severe cough or cough with blood, trouble breathing, severe headache, neck pain/stiffness, vomiting, or any other new/concerning symptoms then return to the ER for evaluation

## 2021-04-19 DIAGNOSIS — R531 Weakness: Secondary | ICD-10-CM | POA: Diagnosis not present

## 2021-04-19 DIAGNOSIS — Z7401 Bed confinement status: Secondary | ICD-10-CM | POA: Diagnosis not present

## 2021-04-19 NOTE — ED Notes (Signed)
RN attempted to call [facility] to give report to the facility. No answer. RN gave report to [employer] with [facility].

## 2021-04-24 LAB — CULTURE, BLOOD (ROUTINE X 2)
Culture: NO GROWTH
Culture: NO GROWTH
Special Requests: ADEQUATE

## 2021-05-12 DIAGNOSIS — F5105 Insomnia due to other mental disorder: Secondary | ICD-10-CM | POA: Diagnosis not present

## 2021-05-12 DIAGNOSIS — F3161 Bipolar disorder, current episode mixed, mild: Secondary | ICD-10-CM | POA: Diagnosis not present

## 2021-05-26 DIAGNOSIS — F3161 Bipolar disorder, current episode mixed, mild: Secondary | ICD-10-CM | POA: Diagnosis not present

## 2021-05-26 DIAGNOSIS — F5105 Insomnia due to other mental disorder: Secondary | ICD-10-CM | POA: Diagnosis not present

## 2021-06-03 DIAGNOSIS — K5900 Constipation, unspecified: Secondary | ICD-10-CM | POA: Diagnosis not present

## 2021-06-03 DIAGNOSIS — I6782 Cerebral ischemia: Secondary | ICD-10-CM | POA: Diagnosis not present

## 2021-06-03 DIAGNOSIS — W06XXXA Fall from bed, initial encounter: Secondary | ICD-10-CM | POA: Diagnosis not present

## 2021-06-03 DIAGNOSIS — R58 Hemorrhage, not elsewhere classified: Secondary | ICD-10-CM | POA: Diagnosis not present

## 2021-06-03 DIAGNOSIS — F5105 Insomnia due to other mental disorder: Secondary | ICD-10-CM | POA: Diagnosis not present

## 2021-06-03 DIAGNOSIS — I639 Cerebral infarction, unspecified: Secondary | ICD-10-CM | POA: Diagnosis not present

## 2021-06-03 DIAGNOSIS — W19XXXA Unspecified fall, initial encounter: Secondary | ICD-10-CM | POA: Diagnosis not present

## 2021-06-03 DIAGNOSIS — G9389 Other specified disorders of brain: Secondary | ICD-10-CM | POA: Diagnosis not present

## 2021-06-03 DIAGNOSIS — Z743 Need for continuous supervision: Secondary | ICD-10-CM | POA: Diagnosis not present

## 2021-06-03 DIAGNOSIS — W1839XA Other fall on same level, initial encounter: Secondary | ICD-10-CM | POA: Diagnosis not present

## 2021-06-03 DIAGNOSIS — I10 Essential (primary) hypertension: Secondary | ICD-10-CM | POA: Diagnosis not present

## 2021-06-03 DIAGNOSIS — S0101XA Laceration without foreign body of scalp, initial encounter: Secondary | ICD-10-CM | POA: Diagnosis not present

## 2021-06-03 DIAGNOSIS — S0990XA Unspecified injury of head, initial encounter: Secondary | ICD-10-CM | POA: Diagnosis not present

## 2021-06-03 DIAGNOSIS — R0689 Other abnormalities of breathing: Secondary | ICD-10-CM | POA: Diagnosis not present

## 2021-06-03 DIAGNOSIS — Y999 Unspecified external cause status: Secondary | ICD-10-CM | POA: Diagnosis not present

## 2021-06-03 DIAGNOSIS — R531 Weakness: Secondary | ICD-10-CM | POA: Diagnosis not present

## 2021-06-03 DIAGNOSIS — S60222A Contusion of left hand, initial encounter: Secondary | ICD-10-CM | POA: Diagnosis not present

## 2021-06-06 DIAGNOSIS — Z87891 Personal history of nicotine dependence: Secondary | ICD-10-CM | POA: Diagnosis not present

## 2021-06-06 DIAGNOSIS — F319 Bipolar disorder, unspecified: Secondary | ICD-10-CM | POA: Diagnosis not present

## 2021-06-06 DIAGNOSIS — Z9181 History of falling: Secondary | ICD-10-CM | POA: Diagnosis not present

## 2021-06-06 DIAGNOSIS — I10 Essential (primary) hypertension: Secondary | ICD-10-CM | POA: Diagnosis not present

## 2021-06-06 DIAGNOSIS — M810 Age-related osteoporosis without current pathological fracture: Secondary | ICD-10-CM | POA: Diagnosis not present

## 2021-06-06 DIAGNOSIS — I700 Atherosclerosis of aorta: Secondary | ICD-10-CM | POA: Diagnosis not present

## 2021-06-06 DIAGNOSIS — W19XXXD Unspecified fall, subsequent encounter: Secondary | ICD-10-CM | POA: Diagnosis not present

## 2021-06-06 DIAGNOSIS — Z8673 Personal history of transient ischemic attack (TIA), and cerebral infarction without residual deficits: Secondary | ICD-10-CM | POA: Diagnosis not present

## 2021-06-06 DIAGNOSIS — M150 Primary generalized (osteo)arthritis: Secondary | ICD-10-CM | POA: Diagnosis not present

## 2021-06-06 DIAGNOSIS — Z993 Dependence on wheelchair: Secondary | ICD-10-CM | POA: Diagnosis not present

## 2021-06-06 DIAGNOSIS — R296 Repeated falls: Secondary | ICD-10-CM | POA: Diagnosis not present

## 2021-06-06 DIAGNOSIS — Z7902 Long term (current) use of antithrombotics/antiplatelets: Secondary | ICD-10-CM | POA: Diagnosis not present

## 2021-06-06 DIAGNOSIS — S0101XD Laceration without foreign body of scalp, subsequent encounter: Secondary | ICD-10-CM | POA: Diagnosis not present

## 2021-06-06 DIAGNOSIS — J449 Chronic obstructive pulmonary disease, unspecified: Secondary | ICD-10-CM | POA: Diagnosis not present

## 2021-06-06 DIAGNOSIS — M6281 Muscle weakness (generalized): Secondary | ICD-10-CM | POA: Diagnosis not present

## 2021-06-06 DIAGNOSIS — Z7982 Long term (current) use of aspirin: Secondary | ICD-10-CM | POA: Diagnosis not present

## 2021-06-07 DIAGNOSIS — I700 Atherosclerosis of aorta: Secondary | ICD-10-CM | POA: Diagnosis not present

## 2021-06-07 DIAGNOSIS — I10 Essential (primary) hypertension: Secondary | ICD-10-CM | POA: Diagnosis not present

## 2021-06-07 DIAGNOSIS — M150 Primary generalized (osteo)arthritis: Secondary | ICD-10-CM | POA: Diagnosis not present

## 2021-06-07 DIAGNOSIS — F319 Bipolar disorder, unspecified: Secondary | ICD-10-CM | POA: Diagnosis not present

## 2021-06-07 DIAGNOSIS — J449 Chronic obstructive pulmonary disease, unspecified: Secondary | ICD-10-CM | POA: Diagnosis not present

## 2021-06-07 DIAGNOSIS — S0101XD Laceration without foreign body of scalp, subsequent encounter: Secondary | ICD-10-CM | POA: Diagnosis not present

## 2021-06-09 DIAGNOSIS — F5105 Insomnia due to other mental disorder: Secondary | ICD-10-CM | POA: Diagnosis not present

## 2021-06-09 DIAGNOSIS — F3161 Bipolar disorder, current episode mixed, mild: Secondary | ICD-10-CM | POA: Diagnosis not present

## 2021-06-16 DIAGNOSIS — J449 Chronic obstructive pulmonary disease, unspecified: Secondary | ICD-10-CM | POA: Diagnosis not present

## 2021-06-16 DIAGNOSIS — I10 Essential (primary) hypertension: Secondary | ICD-10-CM | POA: Diagnosis not present

## 2021-06-16 DIAGNOSIS — F319 Bipolar disorder, unspecified: Secondary | ICD-10-CM | POA: Diagnosis not present

## 2021-06-16 DIAGNOSIS — M150 Primary generalized (osteo)arthritis: Secondary | ICD-10-CM | POA: Diagnosis not present

## 2021-06-16 DIAGNOSIS — S0101XD Laceration without foreign body of scalp, subsequent encounter: Secondary | ICD-10-CM | POA: Diagnosis not present

## 2021-06-16 DIAGNOSIS — I700 Atherosclerosis of aorta: Secondary | ICD-10-CM | POA: Diagnosis not present

## 2021-06-18 DIAGNOSIS — F319 Bipolar disorder, unspecified: Secondary | ICD-10-CM | POA: Diagnosis not present

## 2021-06-18 DIAGNOSIS — M150 Primary generalized (osteo)arthritis: Secondary | ICD-10-CM | POA: Diagnosis not present

## 2021-06-18 DIAGNOSIS — I700 Atherosclerosis of aorta: Secondary | ICD-10-CM | POA: Diagnosis not present

## 2021-06-18 DIAGNOSIS — S0101XD Laceration without foreign body of scalp, subsequent encounter: Secondary | ICD-10-CM | POA: Diagnosis not present

## 2021-06-18 DIAGNOSIS — I10 Essential (primary) hypertension: Secondary | ICD-10-CM | POA: Diagnosis not present

## 2021-06-18 DIAGNOSIS — J449 Chronic obstructive pulmonary disease, unspecified: Secondary | ICD-10-CM | POA: Diagnosis not present

## 2021-06-19 DIAGNOSIS — B351 Tinea unguium: Secondary | ICD-10-CM | POA: Diagnosis not present

## 2021-06-19 DIAGNOSIS — E559 Vitamin D deficiency, unspecified: Secondary | ICD-10-CM | POA: Diagnosis not present

## 2021-06-23 DIAGNOSIS — M150 Primary generalized (osteo)arthritis: Secondary | ICD-10-CM | POA: Diagnosis not present

## 2021-06-23 DIAGNOSIS — I700 Atherosclerosis of aorta: Secondary | ICD-10-CM | POA: Diagnosis not present

## 2021-06-23 DIAGNOSIS — I10 Essential (primary) hypertension: Secondary | ICD-10-CM | POA: Diagnosis not present

## 2021-06-23 DIAGNOSIS — F319 Bipolar disorder, unspecified: Secondary | ICD-10-CM | POA: Diagnosis not present

## 2021-06-23 DIAGNOSIS — J449 Chronic obstructive pulmonary disease, unspecified: Secondary | ICD-10-CM | POA: Diagnosis not present

## 2021-06-23 DIAGNOSIS — S0101XD Laceration without foreign body of scalp, subsequent encounter: Secondary | ICD-10-CM | POA: Diagnosis not present

## 2021-06-24 DIAGNOSIS — M150 Primary generalized (osteo)arthritis: Secondary | ICD-10-CM | POA: Diagnosis not present

## 2021-06-24 DIAGNOSIS — I10 Essential (primary) hypertension: Secondary | ICD-10-CM | POA: Diagnosis not present

## 2021-06-24 DIAGNOSIS — J449 Chronic obstructive pulmonary disease, unspecified: Secondary | ICD-10-CM | POA: Diagnosis not present

## 2021-06-24 DIAGNOSIS — F319 Bipolar disorder, unspecified: Secondary | ICD-10-CM | POA: Diagnosis not present

## 2021-06-24 DIAGNOSIS — I700 Atherosclerosis of aorta: Secondary | ICD-10-CM | POA: Diagnosis not present

## 2021-06-24 DIAGNOSIS — S0101XD Laceration without foreign body of scalp, subsequent encounter: Secondary | ICD-10-CM | POA: Diagnosis not present

## 2021-06-26 DIAGNOSIS — M150 Primary generalized (osteo)arthritis: Secondary | ICD-10-CM | POA: Diagnosis not present

## 2021-06-26 DIAGNOSIS — I700 Atherosclerosis of aorta: Secondary | ICD-10-CM | POA: Diagnosis not present

## 2021-06-26 DIAGNOSIS — I10 Essential (primary) hypertension: Secondary | ICD-10-CM | POA: Diagnosis not present

## 2021-06-26 DIAGNOSIS — S0101XD Laceration without foreign body of scalp, subsequent encounter: Secondary | ICD-10-CM | POA: Diagnosis not present

## 2021-06-26 DIAGNOSIS — F319 Bipolar disorder, unspecified: Secondary | ICD-10-CM | POA: Diagnosis not present

## 2021-06-26 DIAGNOSIS — J449 Chronic obstructive pulmonary disease, unspecified: Secondary | ICD-10-CM | POA: Diagnosis not present

## 2021-06-29 DIAGNOSIS — I10 Essential (primary) hypertension: Secondary | ICD-10-CM | POA: Diagnosis not present

## 2021-06-29 DIAGNOSIS — F319 Bipolar disorder, unspecified: Secondary | ICD-10-CM | POA: Diagnosis not present

## 2021-06-29 DIAGNOSIS — S0101XD Laceration without foreign body of scalp, subsequent encounter: Secondary | ICD-10-CM | POA: Diagnosis not present

## 2021-06-29 DIAGNOSIS — J449 Chronic obstructive pulmonary disease, unspecified: Secondary | ICD-10-CM | POA: Diagnosis not present

## 2021-06-29 DIAGNOSIS — M150 Primary generalized (osteo)arthritis: Secondary | ICD-10-CM | POA: Diagnosis not present

## 2021-06-29 DIAGNOSIS — I700 Atherosclerosis of aorta: Secondary | ICD-10-CM | POA: Diagnosis not present

## 2021-07-06 DIAGNOSIS — R296 Repeated falls: Secondary | ICD-10-CM | POA: Diagnosis not present

## 2021-07-06 DIAGNOSIS — M150 Primary generalized (osteo)arthritis: Secondary | ICD-10-CM | POA: Diagnosis not present

## 2021-07-06 DIAGNOSIS — W19XXXD Unspecified fall, subsequent encounter: Secondary | ICD-10-CM | POA: Diagnosis not present

## 2021-07-06 DIAGNOSIS — Z993 Dependence on wheelchair: Secondary | ICD-10-CM | POA: Diagnosis not present

## 2021-07-06 DIAGNOSIS — Z9181 History of falling: Secondary | ICD-10-CM | POA: Diagnosis not present

## 2021-07-06 DIAGNOSIS — M810 Age-related osteoporosis without current pathological fracture: Secondary | ICD-10-CM | POA: Diagnosis not present

## 2021-07-06 DIAGNOSIS — F319 Bipolar disorder, unspecified: Secondary | ICD-10-CM | POA: Diagnosis not present

## 2021-07-06 DIAGNOSIS — M6281 Muscle weakness (generalized): Secondary | ICD-10-CM | POA: Diagnosis not present

## 2021-07-06 DIAGNOSIS — Z8673 Personal history of transient ischemic attack (TIA), and cerebral infarction without residual deficits: Secondary | ICD-10-CM | POA: Diagnosis not present

## 2021-07-06 DIAGNOSIS — Z7902 Long term (current) use of antithrombotics/antiplatelets: Secondary | ICD-10-CM | POA: Diagnosis not present

## 2021-07-06 DIAGNOSIS — Z87891 Personal history of nicotine dependence: Secondary | ICD-10-CM | POA: Diagnosis not present

## 2021-07-06 DIAGNOSIS — I10 Essential (primary) hypertension: Secondary | ICD-10-CM | POA: Diagnosis not present

## 2021-07-06 DIAGNOSIS — I700 Atherosclerosis of aorta: Secondary | ICD-10-CM | POA: Diagnosis not present

## 2021-07-06 DIAGNOSIS — S0101XD Laceration without foreign body of scalp, subsequent encounter: Secondary | ICD-10-CM | POA: Diagnosis not present

## 2021-07-06 DIAGNOSIS — J449 Chronic obstructive pulmonary disease, unspecified: Secondary | ICD-10-CM | POA: Diagnosis not present

## 2021-07-06 DIAGNOSIS — Z7982 Long term (current) use of aspirin: Secondary | ICD-10-CM | POA: Diagnosis not present

## 2021-07-07 DIAGNOSIS — M150 Primary generalized (osteo)arthritis: Secondary | ICD-10-CM | POA: Diagnosis not present

## 2021-07-07 DIAGNOSIS — I700 Atherosclerosis of aorta: Secondary | ICD-10-CM | POA: Diagnosis not present

## 2021-07-07 DIAGNOSIS — I10 Essential (primary) hypertension: Secondary | ICD-10-CM | POA: Diagnosis not present

## 2021-07-07 DIAGNOSIS — F3161 Bipolar disorder, current episode mixed, mild: Secondary | ICD-10-CM | POA: Diagnosis not present

## 2021-07-07 DIAGNOSIS — F5105 Insomnia due to other mental disorder: Secondary | ICD-10-CM | POA: Diagnosis not present

## 2021-07-07 DIAGNOSIS — F319 Bipolar disorder, unspecified: Secondary | ICD-10-CM | POA: Diagnosis not present

## 2021-07-07 DIAGNOSIS — J449 Chronic obstructive pulmonary disease, unspecified: Secondary | ICD-10-CM | POA: Diagnosis not present

## 2021-07-07 DIAGNOSIS — S0101XD Laceration without foreign body of scalp, subsequent encounter: Secondary | ICD-10-CM | POA: Diagnosis not present

## 2021-07-08 DIAGNOSIS — E039 Hypothyroidism, unspecified: Secondary | ICD-10-CM | POA: Diagnosis not present

## 2021-07-08 DIAGNOSIS — I10 Essential (primary) hypertension: Secondary | ICD-10-CM | POA: Diagnosis not present

## 2021-07-08 DIAGNOSIS — E785 Hyperlipidemia, unspecified: Secondary | ICD-10-CM | POA: Diagnosis not present

## 2021-07-08 DIAGNOSIS — K5900 Constipation, unspecified: Secondary | ICD-10-CM | POA: Diagnosis not present

## 2021-07-08 DIAGNOSIS — F5105 Insomnia due to other mental disorder: Secondary | ICD-10-CM | POA: Diagnosis not present

## 2021-07-10 DIAGNOSIS — I10 Essential (primary) hypertension: Secondary | ICD-10-CM | POA: Diagnosis not present

## 2021-07-10 DIAGNOSIS — J449 Chronic obstructive pulmonary disease, unspecified: Secondary | ICD-10-CM | POA: Diagnosis not present

## 2021-07-10 DIAGNOSIS — I700 Atherosclerosis of aorta: Secondary | ICD-10-CM | POA: Diagnosis not present

## 2021-07-10 DIAGNOSIS — E782 Mixed hyperlipidemia: Secondary | ICD-10-CM | POA: Diagnosis not present

## 2021-07-10 DIAGNOSIS — E039 Hypothyroidism, unspecified: Secondary | ICD-10-CM | POA: Diagnosis not present

## 2021-07-10 DIAGNOSIS — S0101XD Laceration without foreign body of scalp, subsequent encounter: Secondary | ICD-10-CM | POA: Diagnosis not present

## 2021-07-10 DIAGNOSIS — E119 Type 2 diabetes mellitus without complications: Secondary | ICD-10-CM | POA: Diagnosis not present

## 2021-07-10 DIAGNOSIS — F319 Bipolar disorder, unspecified: Secondary | ICD-10-CM | POA: Diagnosis not present

## 2021-07-10 DIAGNOSIS — M150 Primary generalized (osteo)arthritis: Secondary | ICD-10-CM | POA: Diagnosis not present

## 2021-07-14 ENCOUNTER — Telehealth: Payer: Self-pay | Admitting: Family Medicine

## 2021-07-14 DIAGNOSIS — J449 Chronic obstructive pulmonary disease, unspecified: Secondary | ICD-10-CM | POA: Diagnosis not present

## 2021-07-14 DIAGNOSIS — S0101XD Laceration without foreign body of scalp, subsequent encounter: Secondary | ICD-10-CM | POA: Diagnosis not present

## 2021-07-14 DIAGNOSIS — I10 Essential (primary) hypertension: Secondary | ICD-10-CM | POA: Diagnosis not present

## 2021-07-14 DIAGNOSIS — F319 Bipolar disorder, unspecified: Secondary | ICD-10-CM | POA: Diagnosis not present

## 2021-07-14 DIAGNOSIS — I700 Atherosclerosis of aorta: Secondary | ICD-10-CM | POA: Diagnosis not present

## 2021-07-14 DIAGNOSIS — M150 Primary generalized (osteo)arthritis: Secondary | ICD-10-CM | POA: Diagnosis not present

## 2021-07-14 NOTE — Telephone Encounter (Signed)
Pt. Son called in and stated that he is needing a letter stating his mom is unable to do 2 or more of daily activities needed for personal care due to her being wheelchair bound. Pt. Son is requesting letter to send to IRS to amend taxes. He would like to be contacted so he can see how to move forward in this process.  [name]: [phone]

## 2021-07-15 DIAGNOSIS — J449 Chronic obstructive pulmonary disease, unspecified: Secondary | ICD-10-CM | POA: Diagnosis not present

## 2021-07-15 DIAGNOSIS — I700 Atherosclerosis of aorta: Secondary | ICD-10-CM | POA: Diagnosis not present

## 2021-07-15 NOTE — Telephone Encounter (Signed)
Called pt but unable to leave a message.

## 2021-07-16 ENCOUNTER — Encounter: Payer: Self-pay | Admitting: Family Medicine

## 2021-07-16 DIAGNOSIS — F319 Bipolar disorder, unspecified: Secondary | ICD-10-CM | POA: Diagnosis not present

## 2021-07-16 DIAGNOSIS — M150 Primary generalized (osteo)arthritis: Secondary | ICD-10-CM | POA: Diagnosis not present

## 2021-07-16 DIAGNOSIS — I10 Essential (primary) hypertension: Secondary | ICD-10-CM | POA: Diagnosis not present

## 2021-07-16 DIAGNOSIS — I700 Atherosclerosis of aorta: Secondary | ICD-10-CM | POA: Diagnosis not present

## 2021-07-16 DIAGNOSIS — K5900 Constipation, unspecified: Secondary | ICD-10-CM | POA: Diagnosis not present

## 2021-07-16 DIAGNOSIS — F5105 Insomnia due to other mental disorder: Secondary | ICD-10-CM | POA: Diagnosis not present

## 2021-07-16 DIAGNOSIS — J449 Chronic obstructive pulmonary disease, unspecified: Secondary | ICD-10-CM | POA: Diagnosis not present

## 2021-07-16 DIAGNOSIS — S0101XD Laceration without foreign body of scalp, subsequent encounter: Secondary | ICD-10-CM | POA: Diagnosis not present

## 2021-07-16 NOTE — Telephone Encounter (Signed)
Spoke with pt's  and they will mychart letter to [place]

## 2021-07-20 ENCOUNTER — Encounter: Payer: Self-pay | Admitting: *Deleted

## 2021-07-20 ENCOUNTER — Encounter: Payer: Self-pay | Admitting: Family Medicine

## 2021-07-21 ENCOUNTER — Encounter: Payer: Self-pay | Admitting: Family Medicine

## 2021-07-21 DIAGNOSIS — I700 Atherosclerosis of aorta: Secondary | ICD-10-CM | POA: Diagnosis not present

## 2021-07-21 DIAGNOSIS — S0101XD Laceration without foreign body of scalp, subsequent encounter: Secondary | ICD-10-CM | POA: Diagnosis not present

## 2021-07-21 DIAGNOSIS — J449 Chronic obstructive pulmonary disease, unspecified: Secondary | ICD-10-CM | POA: Diagnosis not present

## 2021-07-21 DIAGNOSIS — M150 Primary generalized (osteo)arthritis: Secondary | ICD-10-CM | POA: Diagnosis not present

## 2021-07-21 DIAGNOSIS — F319 Bipolar disorder, unspecified: Secondary | ICD-10-CM | POA: Diagnosis not present

## 2021-07-21 DIAGNOSIS — I10 Essential (primary) hypertension: Secondary | ICD-10-CM | POA: Diagnosis not present

## 2021-07-24 DIAGNOSIS — S0101XD Laceration without foreign body of scalp, subsequent encounter: Secondary | ICD-10-CM | POA: Diagnosis not present

## 2021-07-24 DIAGNOSIS — M150 Primary generalized (osteo)arthritis: Secondary | ICD-10-CM | POA: Diagnosis not present

## 2021-07-24 DIAGNOSIS — I700 Atherosclerosis of aorta: Secondary | ICD-10-CM | POA: Diagnosis not present

## 2021-07-24 DIAGNOSIS — F319 Bipolar disorder, unspecified: Secondary | ICD-10-CM | POA: Diagnosis not present

## 2021-07-24 DIAGNOSIS — I10 Essential (primary) hypertension: Secondary | ICD-10-CM | POA: Diagnosis not present

## 2021-07-24 DIAGNOSIS — J449 Chronic obstructive pulmonary disease, unspecified: Secondary | ICD-10-CM | POA: Diagnosis not present

## 2021-07-27 DIAGNOSIS — I700 Atherosclerosis of aorta: Secondary | ICD-10-CM | POA: Diagnosis not present

## 2021-07-27 DIAGNOSIS — S0101XD Laceration without foreign body of scalp, subsequent encounter: Secondary | ICD-10-CM | POA: Diagnosis not present

## 2021-07-27 DIAGNOSIS — J449 Chronic obstructive pulmonary disease, unspecified: Secondary | ICD-10-CM | POA: Diagnosis not present

## 2021-07-27 DIAGNOSIS — I10 Essential (primary) hypertension: Secondary | ICD-10-CM | POA: Diagnosis not present

## 2021-07-27 DIAGNOSIS — M150 Primary generalized (osteo)arthritis: Secondary | ICD-10-CM | POA: Diagnosis not present

## 2021-07-27 DIAGNOSIS — F319 Bipolar disorder, unspecified: Secondary | ICD-10-CM | POA: Diagnosis not present

## 2021-07-30 DIAGNOSIS — M150 Primary generalized (osteo)arthritis: Secondary | ICD-10-CM | POA: Diagnosis not present

## 2021-07-30 DIAGNOSIS — I10 Essential (primary) hypertension: Secondary | ICD-10-CM | POA: Diagnosis not present

## 2021-07-30 DIAGNOSIS — F319 Bipolar disorder, unspecified: Secondary | ICD-10-CM | POA: Diagnosis not present

## 2021-07-30 DIAGNOSIS — S0101XD Laceration without foreign body of scalp, subsequent encounter: Secondary | ICD-10-CM | POA: Diagnosis not present

## 2021-07-30 DIAGNOSIS — J449 Chronic obstructive pulmonary disease, unspecified: Secondary | ICD-10-CM | POA: Diagnosis not present

## 2021-07-30 DIAGNOSIS — I700 Atherosclerosis of aorta: Secondary | ICD-10-CM | POA: Diagnosis not present

## 2021-08-03 DIAGNOSIS — S0101XD Laceration without foreign body of scalp, subsequent encounter: Secondary | ICD-10-CM | POA: Diagnosis not present

## 2021-08-03 DIAGNOSIS — I10 Essential (primary) hypertension: Secondary | ICD-10-CM | POA: Diagnosis not present

## 2021-08-03 DIAGNOSIS — I700 Atherosclerosis of aorta: Secondary | ICD-10-CM | POA: Diagnosis not present

## 2021-08-03 DIAGNOSIS — J449 Chronic obstructive pulmonary disease, unspecified: Secondary | ICD-10-CM | POA: Diagnosis not present

## 2021-08-03 DIAGNOSIS — F319 Bipolar disorder, unspecified: Secondary | ICD-10-CM | POA: Diagnosis not present

## 2021-08-03 DIAGNOSIS — M150 Primary generalized (osteo)arthritis: Secondary | ICD-10-CM | POA: Diagnosis not present

## 2021-08-04 DIAGNOSIS — F3161 Bipolar disorder, current episode mixed, mild: Secondary | ICD-10-CM | POA: Diagnosis not present

## 2021-08-04 DIAGNOSIS — F5105 Insomnia due to other mental disorder: Secondary | ICD-10-CM | POA: Diagnosis not present

## 2021-08-05 DIAGNOSIS — K5900 Constipation, unspecified: Secondary | ICD-10-CM | POA: Diagnosis not present

## 2021-08-05 DIAGNOSIS — I700 Atherosclerosis of aorta: Secondary | ICD-10-CM | POA: Diagnosis not present

## 2021-08-05 DIAGNOSIS — Z9181 History of falling: Secondary | ICD-10-CM | POA: Diagnosis not present

## 2021-08-05 DIAGNOSIS — M6281 Muscle weakness (generalized): Secondary | ICD-10-CM | POA: Diagnosis not present

## 2021-08-05 DIAGNOSIS — J449 Chronic obstructive pulmonary disease, unspecified: Secondary | ICD-10-CM | POA: Diagnosis not present

## 2021-08-05 DIAGNOSIS — M810 Age-related osteoporosis without current pathological fracture: Secondary | ICD-10-CM | POA: Diagnosis not present

## 2021-08-05 DIAGNOSIS — I10 Essential (primary) hypertension: Secondary | ICD-10-CM | POA: Diagnosis not present

## 2021-08-05 DIAGNOSIS — Z7982 Long term (current) use of aspirin: Secondary | ICD-10-CM | POA: Diagnosis not present

## 2021-08-05 DIAGNOSIS — E039 Hypothyroidism, unspecified: Secondary | ICD-10-CM | POA: Diagnosis not present

## 2021-08-05 DIAGNOSIS — Z87891 Personal history of nicotine dependence: Secondary | ICD-10-CM | POA: Diagnosis not present

## 2021-08-05 DIAGNOSIS — Z8673 Personal history of transient ischemic attack (TIA), and cerebral infarction without residual deficits: Secondary | ICD-10-CM | POA: Diagnosis not present

## 2021-08-05 DIAGNOSIS — F319 Bipolar disorder, unspecified: Secondary | ICD-10-CM | POA: Diagnosis not present

## 2021-08-05 DIAGNOSIS — F5105 Insomnia due to other mental disorder: Secondary | ICD-10-CM | POA: Diagnosis not present

## 2021-08-05 DIAGNOSIS — R296 Repeated falls: Secondary | ICD-10-CM | POA: Diagnosis not present

## 2021-08-05 DIAGNOSIS — M150 Primary generalized (osteo)arthritis: Secondary | ICD-10-CM | POA: Diagnosis not present

## 2021-08-05 DIAGNOSIS — Z7902 Long term (current) use of antithrombotics/antiplatelets: Secondary | ICD-10-CM | POA: Diagnosis not present

## 2021-08-05 DIAGNOSIS — E785 Hyperlipidemia, unspecified: Secondary | ICD-10-CM | POA: Diagnosis not present

## 2021-08-05 DIAGNOSIS — Z993 Dependence on wheelchair: Secondary | ICD-10-CM | POA: Diagnosis not present

## 2021-08-11 DIAGNOSIS — K219 Gastro-esophageal reflux disease without esophagitis: Secondary | ICD-10-CM | POA: Diagnosis not present

## 2021-08-11 DIAGNOSIS — J449 Chronic obstructive pulmonary disease, unspecified: Secondary | ICD-10-CM | POA: Diagnosis not present

## 2021-08-11 DIAGNOSIS — E039 Hypothyroidism, unspecified: Secondary | ICD-10-CM | POA: Diagnosis not present

## 2021-08-11 DIAGNOSIS — M2550 Pain in unspecified joint: Secondary | ICD-10-CM | POA: Diagnosis not present

## 2021-08-11 DIAGNOSIS — F319 Bipolar disorder, unspecified: Secondary | ICD-10-CM | POA: Diagnosis not present

## 2021-08-11 DIAGNOSIS — I679 Cerebrovascular disease, unspecified: Secondary | ICD-10-CM | POA: Diagnosis not present

## 2021-08-11 DIAGNOSIS — G4700 Insomnia, unspecified: Secondary | ICD-10-CM | POA: Diagnosis not present

## 2021-08-12 DIAGNOSIS — I10 Essential (primary) hypertension: Secondary | ICD-10-CM | POA: Diagnosis not present

## 2021-08-12 DIAGNOSIS — I700 Atherosclerosis of aorta: Secondary | ICD-10-CM | POA: Diagnosis not present

## 2021-08-12 DIAGNOSIS — J449 Chronic obstructive pulmonary disease, unspecified: Secondary | ICD-10-CM | POA: Diagnosis not present

## 2021-08-12 DIAGNOSIS — M150 Primary generalized (osteo)arthritis: Secondary | ICD-10-CM | POA: Diagnosis not present

## 2021-08-12 DIAGNOSIS — F319 Bipolar disorder, unspecified: Secondary | ICD-10-CM | POA: Diagnosis not present

## 2021-08-12 DIAGNOSIS — M810 Age-related osteoporosis without current pathological fracture: Secondary | ICD-10-CM | POA: Diagnosis not present

## 2021-08-14 DIAGNOSIS — I10 Essential (primary) hypertension: Secondary | ICD-10-CM | POA: Diagnosis not present

## 2021-08-14 DIAGNOSIS — I739 Peripheral vascular disease, unspecified: Secondary | ICD-10-CM | POA: Diagnosis not present

## 2021-08-14 DIAGNOSIS — F319 Bipolar disorder, unspecified: Secondary | ICD-10-CM | POA: Diagnosis not present

## 2021-08-14 DIAGNOSIS — M150 Primary generalized (osteo)arthritis: Secondary | ICD-10-CM | POA: Diagnosis not present

## 2021-08-14 DIAGNOSIS — B351 Tinea unguium: Secondary | ICD-10-CM | POA: Diagnosis not present

## 2021-08-14 DIAGNOSIS — J449 Chronic obstructive pulmonary disease, unspecified: Secondary | ICD-10-CM | POA: Diagnosis not present

## 2021-08-14 DIAGNOSIS — M810 Age-related osteoporosis without current pathological fracture: Secondary | ICD-10-CM | POA: Diagnosis not present

## 2021-08-14 DIAGNOSIS — I700 Atherosclerosis of aorta: Secondary | ICD-10-CM | POA: Diagnosis not present

## 2021-08-17 DIAGNOSIS — I700 Atherosclerosis of aorta: Secondary | ICD-10-CM | POA: Diagnosis not present

## 2021-08-17 DIAGNOSIS — J449 Chronic obstructive pulmonary disease, unspecified: Secondary | ICD-10-CM | POA: Diagnosis not present

## 2021-08-17 DIAGNOSIS — F319 Bipolar disorder, unspecified: Secondary | ICD-10-CM | POA: Diagnosis not present

## 2021-08-17 DIAGNOSIS — M810 Age-related osteoporosis without current pathological fracture: Secondary | ICD-10-CM | POA: Diagnosis not present

## 2021-08-17 DIAGNOSIS — I10 Essential (primary) hypertension: Secondary | ICD-10-CM | POA: Diagnosis not present

## 2021-08-17 DIAGNOSIS — M150 Primary generalized (osteo)arthritis: Secondary | ICD-10-CM | POA: Diagnosis not present

## 2021-08-21 DIAGNOSIS — J449 Chronic obstructive pulmonary disease, unspecified: Secondary | ICD-10-CM | POA: Diagnosis not present

## 2021-08-21 DIAGNOSIS — I700 Atherosclerosis of aorta: Secondary | ICD-10-CM | POA: Diagnosis not present

## 2021-08-21 DIAGNOSIS — F319 Bipolar disorder, unspecified: Secondary | ICD-10-CM | POA: Diagnosis not present

## 2021-08-21 DIAGNOSIS — M150 Primary generalized (osteo)arthritis: Secondary | ICD-10-CM | POA: Diagnosis not present

## 2021-08-21 DIAGNOSIS — M810 Age-related osteoporosis without current pathological fracture: Secondary | ICD-10-CM | POA: Diagnosis not present

## 2021-08-21 DIAGNOSIS — I10 Essential (primary) hypertension: Secondary | ICD-10-CM | POA: Diagnosis not present

## 2021-08-24 DIAGNOSIS — I700 Atherosclerosis of aorta: Secondary | ICD-10-CM | POA: Diagnosis not present

## 2021-08-24 DIAGNOSIS — M150 Primary generalized (osteo)arthritis: Secondary | ICD-10-CM | POA: Diagnosis not present

## 2021-08-24 DIAGNOSIS — M810 Age-related osteoporosis without current pathological fracture: Secondary | ICD-10-CM | POA: Diagnosis not present

## 2021-08-24 DIAGNOSIS — I10 Essential (primary) hypertension: Secondary | ICD-10-CM | POA: Diagnosis not present

## 2021-08-24 DIAGNOSIS — J449 Chronic obstructive pulmonary disease, unspecified: Secondary | ICD-10-CM | POA: Diagnosis not present

## 2021-08-24 DIAGNOSIS — F319 Bipolar disorder, unspecified: Secondary | ICD-10-CM | POA: Diagnosis not present

## 2021-08-26 DIAGNOSIS — J449 Chronic obstructive pulmonary disease, unspecified: Secondary | ICD-10-CM | POA: Diagnosis not present

## 2021-08-26 DIAGNOSIS — I700 Atherosclerosis of aorta: Secondary | ICD-10-CM | POA: Diagnosis not present

## 2021-08-26 DIAGNOSIS — F319 Bipolar disorder, unspecified: Secondary | ICD-10-CM | POA: Diagnosis not present

## 2021-08-26 DIAGNOSIS — I10 Essential (primary) hypertension: Secondary | ICD-10-CM | POA: Diagnosis not present

## 2021-08-26 DIAGNOSIS — M810 Age-related osteoporosis without current pathological fracture: Secondary | ICD-10-CM | POA: Diagnosis not present

## 2021-08-26 DIAGNOSIS — M150 Primary generalized (osteo)arthritis: Secondary | ICD-10-CM | POA: Diagnosis not present

## 2021-08-27 DIAGNOSIS — F01A0 Vascular dementia, mild, without behavioral disturbance, psychotic disturbance, mood disturbance, and anxiety: Secondary | ICD-10-CM | POA: Diagnosis not present

## 2021-08-27 DIAGNOSIS — F319 Bipolar disorder, unspecified: Secondary | ICD-10-CM | POA: Diagnosis not present

## 2021-08-27 DIAGNOSIS — I679 Cerebrovascular disease, unspecified: Secondary | ICD-10-CM | POA: Diagnosis not present

## 2021-08-31 DIAGNOSIS — I700 Atherosclerosis of aorta: Secondary | ICD-10-CM | POA: Diagnosis not present

## 2021-08-31 DIAGNOSIS — F319 Bipolar disorder, unspecified: Secondary | ICD-10-CM | POA: Diagnosis not present

## 2021-08-31 DIAGNOSIS — J449 Chronic obstructive pulmonary disease, unspecified: Secondary | ICD-10-CM | POA: Diagnosis not present

## 2021-08-31 DIAGNOSIS — M150 Primary generalized (osteo)arthritis: Secondary | ICD-10-CM | POA: Diagnosis not present

## 2021-08-31 DIAGNOSIS — I10 Essential (primary) hypertension: Secondary | ICD-10-CM | POA: Diagnosis not present

## 2021-08-31 DIAGNOSIS — M810 Age-related osteoporosis without current pathological fracture: Secondary | ICD-10-CM | POA: Diagnosis not present

## 2021-09-01 DIAGNOSIS — I10 Essential (primary) hypertension: Secondary | ICD-10-CM | POA: Diagnosis not present

## 2021-09-01 DIAGNOSIS — K5909 Other constipation: Secondary | ICD-10-CM | POA: Diagnosis not present

## 2021-09-01 DIAGNOSIS — E785 Hyperlipidemia, unspecified: Secondary | ICD-10-CM | POA: Diagnosis not present

## 2021-09-01 DIAGNOSIS — E039 Hypothyroidism, unspecified: Secondary | ICD-10-CM | POA: Diagnosis not present

## 2021-09-01 DIAGNOSIS — F5101 Primary insomnia: Secondary | ICD-10-CM | POA: Diagnosis not present

## 2021-09-01 DIAGNOSIS — J441 Chronic obstructive pulmonary disease with (acute) exacerbation: Secondary | ICD-10-CM | POA: Diagnosis not present

## 2021-09-02 DIAGNOSIS — R059 Cough, unspecified: Secondary | ICD-10-CM | POA: Diagnosis not present

## 2021-09-04 DIAGNOSIS — Z993 Dependence on wheelchair: Secondary | ICD-10-CM | POA: Diagnosis not present

## 2021-09-04 DIAGNOSIS — M6281 Muscle weakness (generalized): Secondary | ICD-10-CM | POA: Diagnosis not present

## 2021-09-04 DIAGNOSIS — Z7982 Long term (current) use of aspirin: Secondary | ICD-10-CM | POA: Diagnosis not present

## 2021-09-04 DIAGNOSIS — M810 Age-related osteoporosis without current pathological fracture: Secondary | ICD-10-CM | POA: Diagnosis not present

## 2021-09-04 DIAGNOSIS — Z87891 Personal history of nicotine dependence: Secondary | ICD-10-CM | POA: Diagnosis not present

## 2021-09-04 DIAGNOSIS — I700 Atherosclerosis of aorta: Secondary | ICD-10-CM | POA: Diagnosis not present

## 2021-09-04 DIAGNOSIS — Z8673 Personal history of transient ischemic attack (TIA), and cerebral infarction without residual deficits: Secondary | ICD-10-CM | POA: Diagnosis not present

## 2021-09-04 DIAGNOSIS — Z7902 Long term (current) use of antithrombotics/antiplatelets: Secondary | ICD-10-CM | POA: Diagnosis not present

## 2021-09-04 DIAGNOSIS — I10 Essential (primary) hypertension: Secondary | ICD-10-CM | POA: Diagnosis not present

## 2021-09-04 DIAGNOSIS — Z9181 History of falling: Secondary | ICD-10-CM | POA: Diagnosis not present

## 2021-09-04 DIAGNOSIS — R296 Repeated falls: Secondary | ICD-10-CM | POA: Diagnosis not present

## 2021-09-04 DIAGNOSIS — M150 Primary generalized (osteo)arthritis: Secondary | ICD-10-CM | POA: Diagnosis not present

## 2021-09-04 DIAGNOSIS — F319 Bipolar disorder, unspecified: Secondary | ICD-10-CM | POA: Diagnosis not present

## 2021-09-04 DIAGNOSIS — J449 Chronic obstructive pulmonary disease, unspecified: Secondary | ICD-10-CM | POA: Diagnosis not present

## 2021-09-08 DIAGNOSIS — I10 Essential (primary) hypertension: Secondary | ICD-10-CM | POA: Diagnosis not present

## 2021-09-08 DIAGNOSIS — J449 Chronic obstructive pulmonary disease, unspecified: Secondary | ICD-10-CM | POA: Diagnosis not present

## 2021-09-08 DIAGNOSIS — M150 Primary generalized (osteo)arthritis: Secondary | ICD-10-CM | POA: Diagnosis not present

## 2021-09-08 DIAGNOSIS — I700 Atherosclerosis of aorta: Secondary | ICD-10-CM | POA: Diagnosis not present

## 2021-09-08 DIAGNOSIS — M810 Age-related osteoporosis without current pathological fracture: Secondary | ICD-10-CM | POA: Diagnosis not present

## 2021-09-08 DIAGNOSIS — F319 Bipolar disorder, unspecified: Secondary | ICD-10-CM | POA: Diagnosis not present

## 2021-09-14 DIAGNOSIS — J449 Chronic obstructive pulmonary disease, unspecified: Secondary | ICD-10-CM | POA: Diagnosis not present

## 2021-09-14 DIAGNOSIS — F319 Bipolar disorder, unspecified: Secondary | ICD-10-CM | POA: Diagnosis not present

## 2021-09-14 DIAGNOSIS — I10 Essential (primary) hypertension: Secondary | ICD-10-CM | POA: Diagnosis not present

## 2021-09-14 DIAGNOSIS — M810 Age-related osteoporosis without current pathological fracture: Secondary | ICD-10-CM | POA: Diagnosis not present

## 2021-09-14 DIAGNOSIS — I700 Atherosclerosis of aorta: Secondary | ICD-10-CM | POA: Diagnosis not present

## 2021-09-14 DIAGNOSIS — M150 Primary generalized (osteo)arthritis: Secondary | ICD-10-CM | POA: Diagnosis not present

## 2021-09-17 DIAGNOSIS — M810 Age-related osteoporosis without current pathological fracture: Secondary | ICD-10-CM | POA: Diagnosis not present

## 2021-09-17 DIAGNOSIS — I700 Atherosclerosis of aorta: Secondary | ICD-10-CM | POA: Diagnosis not present

## 2021-09-17 DIAGNOSIS — F319 Bipolar disorder, unspecified: Secondary | ICD-10-CM | POA: Diagnosis not present

## 2021-09-17 DIAGNOSIS — M150 Primary generalized (osteo)arthritis: Secondary | ICD-10-CM | POA: Diagnosis not present

## 2021-09-17 DIAGNOSIS — I10 Essential (primary) hypertension: Secondary | ICD-10-CM | POA: Diagnosis not present

## 2021-09-17 DIAGNOSIS — J449 Chronic obstructive pulmonary disease, unspecified: Secondary | ICD-10-CM | POA: Diagnosis not present

## 2021-09-21 DIAGNOSIS — M810 Age-related osteoporosis without current pathological fracture: Secondary | ICD-10-CM | POA: Diagnosis not present

## 2021-09-21 DIAGNOSIS — J449 Chronic obstructive pulmonary disease, unspecified: Secondary | ICD-10-CM | POA: Diagnosis not present

## 2021-09-21 DIAGNOSIS — F319 Bipolar disorder, unspecified: Secondary | ICD-10-CM | POA: Diagnosis not present

## 2021-09-21 DIAGNOSIS — I10 Essential (primary) hypertension: Secondary | ICD-10-CM | POA: Diagnosis not present

## 2021-09-21 DIAGNOSIS — M150 Primary generalized (osteo)arthritis: Secondary | ICD-10-CM | POA: Diagnosis not present

## 2021-09-21 DIAGNOSIS — I700 Atherosclerosis of aorta: Secondary | ICD-10-CM | POA: Diagnosis not present

## 2021-09-24 DIAGNOSIS — J449 Chronic obstructive pulmonary disease, unspecified: Secondary | ICD-10-CM | POA: Diagnosis not present

## 2021-09-24 DIAGNOSIS — M810 Age-related osteoporosis without current pathological fracture: Secondary | ICD-10-CM | POA: Diagnosis not present

## 2021-09-24 DIAGNOSIS — F319 Bipolar disorder, unspecified: Secondary | ICD-10-CM | POA: Diagnosis not present

## 2021-09-24 DIAGNOSIS — M150 Primary generalized (osteo)arthritis: Secondary | ICD-10-CM | POA: Diagnosis not present

## 2021-09-24 DIAGNOSIS — F5101 Primary insomnia: Secondary | ICD-10-CM | POA: Diagnosis not present

## 2021-09-24 DIAGNOSIS — F3161 Bipolar disorder, current episode mixed, mild: Secondary | ICD-10-CM | POA: Diagnosis not present

## 2021-09-24 DIAGNOSIS — I700 Atherosclerosis of aorta: Secondary | ICD-10-CM | POA: Diagnosis not present

## 2021-09-24 DIAGNOSIS — I10 Essential (primary) hypertension: Secondary | ICD-10-CM | POA: Diagnosis not present

## 2021-09-25 DIAGNOSIS — N1831 Chronic kidney disease, stage 3a: Secondary | ICD-10-CM | POA: Diagnosis not present

## 2021-09-25 DIAGNOSIS — J449 Chronic obstructive pulmonary disease, unspecified: Secondary | ICD-10-CM | POA: Diagnosis not present

## 2021-09-25 DIAGNOSIS — U071 COVID-19: Secondary | ICD-10-CM | POA: Diagnosis not present

## 2021-09-25 DIAGNOSIS — E876 Hypokalemia: Secondary | ICD-10-CM | POA: Diagnosis not present

## 2021-09-25 DIAGNOSIS — I129 Hypertensive chronic kidney disease with stage 1 through stage 4 chronic kidney disease, or unspecified chronic kidney disease: Secondary | ICD-10-CM | POA: Diagnosis not present

## 2021-09-25 DIAGNOSIS — F5105 Insomnia due to other mental disorder: Secondary | ICD-10-CM | POA: Diagnosis not present

## 2021-09-28 DIAGNOSIS — M810 Age-related osteoporosis without current pathological fracture: Secondary | ICD-10-CM | POA: Diagnosis not present

## 2021-09-28 DIAGNOSIS — J069 Acute upper respiratory infection, unspecified: Secondary | ICD-10-CM | POA: Diagnosis not present

## 2021-09-28 DIAGNOSIS — I700 Atherosclerosis of aorta: Secondary | ICD-10-CM | POA: Diagnosis not present

## 2021-09-28 DIAGNOSIS — J449 Chronic obstructive pulmonary disease, unspecified: Secondary | ICD-10-CM | POA: Diagnosis not present

## 2021-09-28 DIAGNOSIS — I69351 Hemiplegia and hemiparesis following cerebral infarction affecting right dominant side: Secondary | ICD-10-CM | POA: Diagnosis not present

## 2021-09-28 DIAGNOSIS — E039 Hypothyroidism, unspecified: Secondary | ICD-10-CM | POA: Diagnosis not present

## 2021-09-28 DIAGNOSIS — E785 Hyperlipidemia, unspecified: Secondary | ICD-10-CM | POA: Diagnosis not present

## 2021-09-28 DIAGNOSIS — F3161 Bipolar disorder, current episode mixed, mild: Secondary | ICD-10-CM | POA: Diagnosis not present

## 2021-09-28 DIAGNOSIS — E876 Hypokalemia: Secondary | ICD-10-CM | POA: Diagnosis not present

## 2021-09-28 DIAGNOSIS — M150 Primary generalized (osteo)arthritis: Secondary | ICD-10-CM | POA: Diagnosis not present

## 2021-09-28 DIAGNOSIS — I10 Essential (primary) hypertension: Secondary | ICD-10-CM | POA: Diagnosis not present

## 2021-09-28 DIAGNOSIS — Z993 Dependence on wheelchair: Secondary | ICD-10-CM | POA: Diagnosis not present

## 2021-09-28 DIAGNOSIS — F319 Bipolar disorder, unspecified: Secondary | ICD-10-CM | POA: Diagnosis not present

## 2021-09-29 DIAGNOSIS — J069 Acute upper respiratory infection, unspecified: Secondary | ICD-10-CM | POA: Diagnosis not present

## 2021-10-01 DIAGNOSIS — I700 Atherosclerosis of aorta: Secondary | ICD-10-CM | POA: Diagnosis not present

## 2021-10-01 DIAGNOSIS — I10 Essential (primary) hypertension: Secondary | ICD-10-CM | POA: Diagnosis not present

## 2021-10-01 DIAGNOSIS — J449 Chronic obstructive pulmonary disease, unspecified: Secondary | ICD-10-CM | POA: Diagnosis not present

## 2021-10-01 DIAGNOSIS — F319 Bipolar disorder, unspecified: Secondary | ICD-10-CM | POA: Diagnosis not present

## 2021-10-01 DIAGNOSIS — M150 Primary generalized (osteo)arthritis: Secondary | ICD-10-CM | POA: Diagnosis not present

## 2021-10-01 DIAGNOSIS — M810 Age-related osteoporosis without current pathological fracture: Secondary | ICD-10-CM | POA: Diagnosis not present

## 2022-09-14 ENCOUNTER — Telehealth: Payer: Self-pay

## 2022-09-14 NOTE — Telephone Encounter (Signed)
[facility] called to verify that pt has a Dx of CFH or what the reasoning for taking Lasix is. Pt has not been seen in 2 years.

## 2022-09-15 ENCOUNTER — Telehealth: Payer: Self-pay | Admitting: Family Medicine

## 2022-09-15 NOTE — Telephone Encounter (Signed)
[name] [name] is aware of the following info- will let Sham[facility] schedule the appointment for the F2F.

## 2022-09-15 NOTE — Telephone Encounter (Signed)
Called agency back and no voicemail to leave message Caller/Agency: [facility] Number: [phone]

## 2022-09-15 NOTE — Telephone Encounter (Signed)
Caller/Agency: [facility] Number: [phone] Requesting OT/PT/Skilled Nursing/Social Work/Speech Therapy:  Frequency: Patient has a low grade tempt, crackle and wheezing, pt refused to go to the hospital. They are requesting an order portable chest x-ray. Please advise.

## 2022-09-16 NOTE — Telephone Encounter (Signed)
Called the Agency back twice and can't lvm  Will try later again. We have appt date for pt.

## 2022-09-17 NOTE — Telephone Encounter (Signed)
Called pt family to discuss with them @ need  appointment, Pt son stated pt isn't able to walk Are able to come in the office. Pt son was advised [facility] provider should be taking over care For any orders for the pt. Family stated understand.

## 2022-09-17 NOTE — Telephone Encounter (Signed)
Called pt family and was advised the [employer]  Provider should be signing out for her orders

## 2022-12-01 ENCOUNTER — Ambulatory Visit (INDEPENDENT_AMBULATORY_CARE_PROVIDER_SITE_OTHER): Payer: Medicare Other | Admitting: Podiatry

## 2022-12-01 DIAGNOSIS — B351 Tinea unguium: Secondary | ICD-10-CM

## 2022-12-01 DIAGNOSIS — L603 Nail dystrophy: Secondary | ICD-10-CM | POA: Diagnosis not present

## 2022-12-01 DIAGNOSIS — M79675 Pain in left toe(s): Secondary | ICD-10-CM

## 2022-12-01 DIAGNOSIS — R52 Pain, unspecified: Secondary | ICD-10-CM | POA: Diagnosis not present

## 2022-12-01 DIAGNOSIS — M79674 Pain in right toe(s): Secondary | ICD-10-CM | POA: Diagnosis not present

## 2022-12-01 NOTE — Progress Notes (Signed)
   Chief Complaint  Patient presents with   Nail Problem    Left hallux nail is coming off, blood and drainage    SUBJECTIVE Patient nonambulatory wheelchair-bound presents to office today complaining of elongated, thickened nails that cause pain while ambulating in shoes.  Patient is unable to trim their own nails.   Patient also presents for new complaint of pain and states that about 1-1/2 weeks ago she injured her left great toenail on a lift that transfers the patient from the bed to the chair.  She says her toenail got caught and it created blood and tenderness to this area.  She has not had it treated at the moment.  Patient is here for further evaluation and treatment.  Past Medical History:  Diagnosis Date   Anemia    h/o fibroids   Bipolar disorder ([place])    Depression    bipolar   Eczema    H/O stroke within last year 11/19/2018   HTN (hypertension)    Hypothyroidism    Insomnia    Osteoarthritis    Osteoporosis    Psoriasis     Allergies  Allergen Reactions   Bupropion Hcl Rash     OBJECTIVE General Patient is awake, alert, and oriented x 3 and in no acute distress. Derm Skin is dry and supple bilateral. Negative open lesions or macerations. Remaining integument unremarkable. Nails are tender, long, thickened and dystrophic with subungual debris, consistent with onychomycosis, 1-5 bilateral. No signs of infection noted.  Dry sanguinous drainage noted to the loosely adhered nail plate left hallux.  Please see above noted photo. Vasc  DP and PT pedal pulses palpable bilaterally. Temperature gradient within normal limits.  Capillary refill immediate.  Chronic edema noted bilateral lower extremities Neuro Epicritic and protective threshold sensation grossly intact bilaterally.  Musculoskeletal Exam No symptomatic pedal deformities noted bilateral. Muscular strength within normal limits.  ASSESSMENT 1.  Pain due to onychomycosis of toenails both 2.  Traumatic injury to  the left hallux nail plate  PLAN OF CARE 1. Patient evaluated today.  2. Instructed to maintain good pedal hygiene and foot care.  3. Mechanical debridement of nails 1-5 bilaterally performed using a nail nipper. Filed with dremel without incident.  4.  Today we discussed treatment and management of the left hallux nail plate.  The patient would like to have the nail plate removed.  The area was prepped aseptically and digital block performed using 3 mL of 2% lidocaine plain.  The nail was avulsed in its entirety with dry sterile dressings applied.  Recommend antibiotic ointment and a Band-Aid beginning tomorrow  5.  Return to clinic in 3 mos. for routine footcare   Edrick Kins, DPM [facility]  Dr. Edrick Kins, DPM    [address], [place] [address]                Office [phone]  Fax [phone]

## 2022-12-02 ENCOUNTER — Telehealth: Payer: Self-pay | Admitting: *Deleted

## 2022-12-02 NOTE — Telephone Encounter (Signed)
Called [facility] to give update per physician, spoke with Hinda Kehr, will be needing information in writing, faxed to apply triple antibiotic 3 weeks complete.

## 2022-12-02 NOTE — Telephone Encounter (Signed)
[facility] is calling to clarify the order sent to them yesterday for the triple antibiotic ointment, stated that it has to be either x 2 wks complete or x 3 wks -complete, please advise.May fax new orders to:[phone]

## 2022-12-02 NOTE — Telephone Encounter (Signed)
3 weeks complete. - Dr. Amalia Hailey

## 2022-12-03 NOTE — Telephone Encounter (Signed)
Okay could you write it and fax it?

## 2022-12-24 ENCOUNTER — Ambulatory Visit (INDEPENDENT_AMBULATORY_CARE_PROVIDER_SITE_OTHER): Payer: Medicare Other | Admitting: Podiatry

## 2022-12-24 DIAGNOSIS — L603 Nail dystrophy: Secondary | ICD-10-CM | POA: Diagnosis not present

## 2022-12-24 NOTE — Progress Notes (Signed)
   Chief Complaint  Patient presents with   Nail Problem    3wks for nail problem    SUBJECTIVE Patient nonambulatory wheelchair-bound presents to office today complaining of elongated, thickened nails that cause pain while ambulating in shoes.  Patient is unable to trim their own nails.  Last visit on 12/01/2022 we performed a total temporary nail avulsion of the left hallux nail plate.  It has healed nicely.  Past Medical History:  Diagnosis Date   Anemia    h/o fibroids   Bipolar disorder ([place])    Depression    bipolar   Eczema    H/O stroke within last year 11/19/2018   HTN (hypertension)    Hypothyroidism    Insomnia    Osteoarthritis    Osteoporosis    Psoriasis     Allergies  Allergen Reactions   Bupropion Hcl Rash    OBJECTIVE General Patient is awake, alert, and oriented x 3 and in no acute distress. Derm Skin is dry and supple bilateral. Negative open lesions or macerations. Remaining integument unremarkable. Nails are tender, long, thickened and dystrophic with subungual debris, consistent with onychomycosis, 1-5 bilateral. No signs of infection noted.  Absence of the left hallux nail plate noted with good healing of the underlying nailbed.  No drainage.  No clinical indication of infection.  Well-healed Vasc  Chronic edema noted bilateral lower extremities Neuro Epicritic and protective threshold sensation grossly intact bilaterally.  Musculoskeletal Exam nonambulatory in a wheelchair  ASSESSMENT 1.  Pain due to onychomycosis of toenails both 2.  History of total nail avulsion left hallux nail plate  PLAN OF CARE 1. Patient evaluated today.  2.  Light debridement of the lesser digits were performed today. 3.  Return to clinic 3 months for routine footcare or as needed  Edrick Kins, DPM [facility]  Dr. Edrick Kins, DPM    [address],  [address]                Office [phone]   Fax [phone]

## 2023-09-21 ENCOUNTER — Ambulatory Visit: Payer: Medicare Other | Admitting: Podiatry

## 2023-09-22 ENCOUNTER — Encounter (HOSPITAL_COMMUNITY): Payer: Self-pay

## 2023-09-22 ENCOUNTER — Emergency Department (HOSPITAL_COMMUNITY)
Admission: EM | Admit: 2023-09-22 | Discharge: 2023-09-23 | Disposition: A | Discharge status: Home / Self Care | Payer: Medicare Other | Attending: Emergency Medicine | Admitting: Emergency Medicine

## 2023-09-22 ENCOUNTER — Other Ambulatory Visit: Payer: Self-pay

## 2023-09-22 ENCOUNTER — Emergency Department (HOSPITAL_COMMUNITY): Payer: Medicare Other

## 2023-09-22 DIAGNOSIS — I82431 Acute embolism and thrombosis of right popliteal vein: Secondary | ICD-10-CM | POA: Diagnosis not present

## 2023-09-22 DIAGNOSIS — Z8673 Personal history of transient ischemic attack (TIA), and cerebral infarction without residual deficits: Secondary | ICD-10-CM | POA: Insufficient documentation

## 2023-09-22 DIAGNOSIS — M7989 Other specified soft tissue disorders: Secondary | ICD-10-CM

## 2023-09-22 DIAGNOSIS — Z7982 Long term (current) use of aspirin: Secondary | ICD-10-CM | POA: Diagnosis not present

## 2023-09-22 DIAGNOSIS — I10 Essential (primary) hypertension: Secondary | ICD-10-CM | POA: Diagnosis not present

## 2023-09-22 DIAGNOSIS — Z79899 Other long term (current) drug therapy: Secondary | ICD-10-CM | POA: Diagnosis not present

## 2023-09-22 DIAGNOSIS — M79661 Pain in right lower leg: Secondary | ICD-10-CM | POA: Diagnosis present

## 2023-09-22 LAB — BASIC METABOLIC PANEL
Anion gap: 10 (ref 5–15)
BUN: 14 mg/dL (ref 8–23)
CO2: 24 mmol/L (ref 22–32)
Calcium: 8.3 mg/dL — ABNORMAL LOW (ref 8.9–10.3)
Chloride: 103 mmol/L (ref 98–111)
Creatinine, Ser: 1.11 mg/dL — ABNORMAL HIGH (ref 0.44–1.00)
GFR, Estimated: 48 mL/min — ABNORMAL LOW (ref >60)
Glucose, Bld: 109 mg/dL — ABNORMAL HIGH (ref 70–99)
Potassium: 3.9 mmol/L (ref 3.5–5.1)
Sodium: 137 mmol/L (ref 135–145)

## 2023-09-22 LAB — CBC WITH DIFFERENTIAL/PLATELET
Abs Immature Granulocytes: 0.1 10*3/uL — ABNORMAL HIGH (ref 0.00–0.07)
Basophils Absolute: 0.1 10*3/uL (ref 0.0–0.1)
Basophils Relative: 1 %
Eosinophils Absolute: 0 10*3/uL (ref 0.0–0.5)
Eosinophils Relative: 0 %
HCT: 30.5 % — ABNORMAL LOW (ref 36.0–46.0)
Hemoglobin: 9.4 g/dL — ABNORMAL LOW (ref 12.0–15.0)
Immature Granulocytes: 1 %
Lymphocytes Relative: 16 %
Lymphs Abs: 1.9 10*3/uL (ref 0.7–4.0)
MCH: 28.7 pg (ref 26.0–34.0)
MCHC: 30.8 g/dL (ref 30.0–36.0)
MCV: 93.3 fL (ref 80.0–100.0)
Monocytes Absolute: 1.5 10*3/uL — ABNORMAL HIGH (ref 0.1–1.0)
Monocytes Relative: 13 %
Neutro Abs: 8.2 10*3/uL — ABNORMAL HIGH (ref 1.7–7.7)
Neutrophils Relative %: 69 %
Platelets: 326 10*3/uL (ref 150–400)
RBC: 3.27 MIL/uL — ABNORMAL LOW (ref 3.87–5.11)
RDW: 14.9 % (ref 11.5–15.5)
WBC: 11.7 10*3/uL — ABNORMAL HIGH (ref 4.0–10.5)
nRBC: 0 % (ref 0.0–0.2)

## 2023-09-22 MED ORDER — APIXABAN (ELIQUIS) VTE STARTER PACK (10MG AND 5MG): ORAL_TABLET | ORAL | 0 refills | Status: DC

## 2023-09-22 MED ORDER — APIXABAN 5 MG PO TABS
10.0000 mg | ORAL_TABLET | Freq: Once | ORAL | Status: AC
  Administered 2023-09-22: 10 mg via ORAL
  Filled 2023-09-22: qty 2

## 2023-09-22 NOTE — ED Notes (Signed)
[facility] called for transport. JRPRN

## 2023-09-22 NOTE — Progress Notes (Signed)
RLE venous duplex has been completed.  Preliminary results given to Dr. Theresia Lo.    Results can be found under chart review under CV PROC. 09/22/2023 5:26 PM Yostin Malacara RVT, RDMS
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
## 2023-09-22 NOTE — ED Notes (Signed)
Report called to [facility], report given to [facility]. JRPRN

## 2023-09-22 NOTE — Discharge Instructions (Addendum)
Information on my medicine - ELIQUIS (apixaban)  This medication education was reviewed with me or my healthcare representative as part of my discharge preparation.   Why was Eliquis prescribed for you? Eliquis was prescribed to treat blood clots that may have been found in the veins of your legs (deep vein thrombosis) or in your lungs (pulmonary embolism) and to reduce the risk of them occurring again.  What do You need to know about Eliquis ? The starting dose is 10 mg (two 5 mg tablets) taken TWICE daily for the FIRST SEVEN (7) DAYS, then on 09/30/23 the dose is reduced to ONE 5 mg tablet taken TWICE daily.  Eliquis may be taken with or without food.   Try to take the dose about the same time in the morning and in the evening. If you have difficulty swallowing the tablet whole please discuss with your pharmacist how to take the medication safely.  Take Eliquis exactly as prescribed and DO NOT stop taking Eliquis without talking to the doctor who prescribed the medication.  Stopping may increase your risk of developing a new blood clot.  Refill your prescription before you run out.  After discharge, you should have regular check-up appointments with your healthcare provider that is prescribing your Eliquis.    What do you do if you miss a dose? If a dose of ELIQUIS is not taken at the scheduled time, take it as soon as possible on the same day and twice-daily administration should be resumed. The dose should not be doubled to make up for a missed dose.  Important Safety Information A possible side effect of Eliquis is bleeding. You should call your healthcare provider right away if you experience any of the following: Bleeding from an injury or your nose that does not stop. Unusual colored urine (red or dark brown) or unusual colored stools (red or black). Unusual bruising for unknown reasons. A serious fall or if you hit your head (even if there is no bleeding).  Some medicines  may interact with Eliquis and might increase your risk of bleeding or clotting while on Eliquis. To help avoid this, consult your healthcare provider or pharmacist prior to using any new prescription or non-prescription medications, including herbals, vitamins, non-steroidal anti-inflammatory drugs (NSAIDs) and supplements.  This website has more information on Eliquis (apixaban): http://www.eliquis.com/eliquis/homeYou were seen in the emergency department for your leg pain.  You do have a blood clot in your right leg and I have given you a prescription for blood thinner and you should take this as prescribed.  You can follow-up with your primary doctor for further management of your blood clot.  You should return to the emergency department if you develop chest pain or shortness of breath or if you have any other new or concerning symptoms.

## 2023-10-04 ENCOUNTER — Inpatient Hospital Stay (HOSPITAL_COMMUNITY)
Admission: EM | Admit: 2023-10-04 | Discharge: 2023-10-07 | DRG: 534 | Disposition: A | Discharge status: Skilled Nursing Facility | Payer: Medicare Other | Source: Skilled Nursing Facility | Attending: Internal Medicine | Admitting: Internal Medicine

## 2023-10-04 ENCOUNTER — Emergency Department (HOSPITAL_COMMUNITY): Payer: Medicare Other

## 2023-10-04 ENCOUNTER — Encounter (HOSPITAL_COMMUNITY): Payer: Self-pay

## 2023-10-04 DIAGNOSIS — Z90710 Acquired absence of both cervix and uterus: Secondary | ICD-10-CM

## 2023-10-04 DIAGNOSIS — M9711XA Periprosthetic fracture around internal prosthetic right knee joint, initial encounter: Secondary | ICD-10-CM | POA: Diagnosis present

## 2023-10-04 DIAGNOSIS — Z7401 Bed confinement status: Secondary | ICD-10-CM | POA: Diagnosis not present

## 2023-10-04 DIAGNOSIS — Z8042 Family history of malignant neoplasm of prostate: Secondary | ICD-10-CM

## 2023-10-04 DIAGNOSIS — Z8261 Family history of arthritis: Secondary | ICD-10-CM

## 2023-10-04 DIAGNOSIS — M810 Age-related osteoporosis without current pathological fracture: Secondary | ICD-10-CM | POA: Diagnosis present

## 2023-10-04 DIAGNOSIS — Z8249 Family history of ischemic heart disease and other diseases of the circulatory system: Secondary | ICD-10-CM | POA: Diagnosis not present

## 2023-10-04 DIAGNOSIS — E785 Hyperlipidemia, unspecified: Secondary | ICD-10-CM | POA: Diagnosis present

## 2023-10-04 DIAGNOSIS — Z6830 Body mass index (BMI) 30.0-30.9, adult: Secondary | ICD-10-CM

## 2023-10-04 DIAGNOSIS — W19XXXA Unspecified fall, initial encounter: Secondary | ICD-10-CM | POA: Diagnosis present

## 2023-10-04 DIAGNOSIS — Z79899 Other long term (current) drug therapy: Secondary | ICD-10-CM

## 2023-10-04 DIAGNOSIS — N1831 Chronic kidney disease, stage 3a: Secondary | ICD-10-CM | POA: Diagnosis present

## 2023-10-04 DIAGNOSIS — Z79890 Hormone replacement therapy (postmenopausal): Secondary | ICD-10-CM | POA: Diagnosis not present

## 2023-10-04 DIAGNOSIS — Z66 Do not resuscitate: Secondary | ICD-10-CM | POA: Diagnosis present

## 2023-10-04 DIAGNOSIS — E669 Obesity, unspecified: Secondary | ICD-10-CM | POA: Diagnosis present

## 2023-10-04 DIAGNOSIS — M25561 Pain in right knee: Secondary | ICD-10-CM | POA: Diagnosis present

## 2023-10-04 DIAGNOSIS — D649 Anemia, unspecified: Secondary | ICD-10-CM | POA: Diagnosis present

## 2023-10-04 DIAGNOSIS — Z800 Family history of malignant neoplasm of digestive organs: Secondary | ICD-10-CM

## 2023-10-04 DIAGNOSIS — I69351 Hemiplegia and hemiparesis following cerebral infarction affecting right dominant side: Secondary | ICD-10-CM

## 2023-10-04 DIAGNOSIS — R627 Adult failure to thrive: Secondary | ICD-10-CM | POA: Diagnosis present

## 2023-10-04 DIAGNOSIS — F319 Bipolar disorder, unspecified: Secondary | ICD-10-CM | POA: Diagnosis present

## 2023-10-04 DIAGNOSIS — Z1152 Encounter for screening for COVID-19: Secondary | ICD-10-CM | POA: Diagnosis not present

## 2023-10-04 DIAGNOSIS — S8011XA Contusion of right lower leg, initial encounter: Secondary | ICD-10-CM | POA: Diagnosis present

## 2023-10-04 DIAGNOSIS — M1990 Unspecified osteoarthritis, unspecified site: Secondary | ICD-10-CM | POA: Diagnosis present

## 2023-10-04 DIAGNOSIS — I6930 Unspecified sequelae of cerebral infarction: Secondary | ICD-10-CM

## 2023-10-04 DIAGNOSIS — Z888 Allergy status to other drugs, medicaments and biological substances status: Secondary | ICD-10-CM

## 2023-10-04 DIAGNOSIS — E039 Hypothyroidism, unspecified: Secondary | ICD-10-CM | POA: Diagnosis present

## 2023-10-04 DIAGNOSIS — I82531 Chronic embolism and thrombosis of right popliteal vein: Secondary | ICD-10-CM | POA: Diagnosis present

## 2023-10-04 DIAGNOSIS — Z801 Family history of malignant neoplasm of trachea, bronchus and lung: Secondary | ICD-10-CM

## 2023-10-04 DIAGNOSIS — S72009A Fracture of unspecified part of neck of unspecified femur, initial encounter for closed fracture: Secondary | ICD-10-CM | POA: Diagnosis present

## 2023-10-04 DIAGNOSIS — J449 Chronic obstructive pulmonary disease, unspecified: Secondary | ICD-10-CM | POA: Diagnosis present

## 2023-10-04 DIAGNOSIS — I129 Hypertensive chronic kidney disease with stage 1 through stage 4 chronic kidney disease, or unspecified chronic kidney disease: Secondary | ICD-10-CM | POA: Diagnosis present

## 2023-10-04 DIAGNOSIS — S72401A Unspecified fracture of lower end of right femur, initial encounter for closed fracture: Principal | ICD-10-CM

## 2023-10-04 DIAGNOSIS — E66811 Obesity, class 1: Secondary | ICD-10-CM | POA: Diagnosis present

## 2023-10-04 DIAGNOSIS — Z87891 Personal history of nicotine dependence: Secondary | ICD-10-CM | POA: Diagnosis not present

## 2023-10-04 DIAGNOSIS — Z7902 Long term (current) use of antithrombotics/antiplatelets: Secondary | ICD-10-CM

## 2023-10-04 DIAGNOSIS — Z7982 Long term (current) use of aspirin: Secondary | ICD-10-CM

## 2023-10-04 DIAGNOSIS — I10 Essential (primary) hypertension: Secondary | ICD-10-CM | POA: Diagnosis present

## 2023-10-04 LAB — CBC WITH DIFFERENTIAL/PLATELET
Abs Immature Granulocytes: 0.22 10*3/uL — ABNORMAL HIGH (ref 0.00–0.07)
Basophils Absolute: 0.1 10*3/uL (ref 0.0–0.1)
Basophils Relative: 1 %
Eosinophils Absolute: 0 10*3/uL (ref 0.0–0.5)
Eosinophils Relative: 0 %
HCT: 26.8 % — ABNORMAL LOW (ref 36.0–46.0)
Hemoglobin: 8.3 g/dL — ABNORMAL LOW (ref 12.0–15.0)
Immature Granulocytes: 2 %
Lymphocytes Relative: 20 %
Lymphs Abs: 2.2 10*3/uL (ref 0.7–4.0)
MCH: 29.5 pg (ref 26.0–34.0)
MCHC: 31 g/dL (ref 30.0–36.0)
MCV: 95.4 fL (ref 80.0–100.0)
Monocytes Absolute: 1 10*3/uL (ref 0.1–1.0)
Monocytes Relative: 9 %
Neutro Abs: 7.5 10*3/uL (ref 1.7–7.7)
Neutrophils Relative %: 68 %
Platelets: 543 10*3/uL — ABNORMAL HIGH (ref 150–400)
RBC: 2.81 MIL/uL — ABNORMAL LOW (ref 3.87–5.11)
RDW: 17.2 % — ABNORMAL HIGH (ref 11.5–15.5)
WBC: 11 10*3/uL — ABNORMAL HIGH (ref 4.0–10.5)
nRBC: 0 % (ref 0.0–0.2)

## 2023-10-04 LAB — BASIC METABOLIC PANEL
Anion gap: 12 (ref 5–15)
BUN: 17 mg/dL (ref 8–23)
CO2: 23 mmol/L (ref 22–32)
Calcium: 8.9 mg/dL (ref 8.9–10.3)
Chloride: 105 mmol/L (ref 98–111)
Creatinine, Ser: 0.91 mg/dL (ref 0.44–1.00)
GFR, Estimated: >60 mL/min (ref >60)
Glucose, Bld: 100 mg/dL — ABNORMAL HIGH (ref 70–99)
Potassium: 4 mmol/L (ref 3.5–5.1)
Sodium: 140 mmol/L (ref 135–145)

## 2023-10-04 MED ORDER — POLYETHYLENE GLYCOL 3350 17 G PO PACK: 17.0000 g | PACK | Freq: Every day | ORAL | Status: DC | PRN

## 2023-10-04 MED ORDER — ACETAMINOPHEN 650 MG RE SUPP: 650.0000 mg | Freq: Four times a day (QID) | RECTAL | Status: DC | PRN

## 2023-10-04 MED ORDER — OXYCODONE-ACETAMINOPHEN 5-325 MG PO TABS
1.0000 | ORAL_TABLET | Freq: Once | ORAL | Status: AC
Start: 2023-10-04 — End: 2023-10-04
  Administered 2023-10-04: 1 via ORAL
  Filled 2023-10-04: qty 1

## 2023-10-04 MED ORDER — DONEPEZIL HCL 5 MG PO TABS
5.0000 mg | ORAL_TABLET | Freq: Every day | ORAL | Status: DC
  Administered 2023-10-04 – 2023-10-06 (×3): 5 mg via ORAL
  Filled 2023-10-04 (×3): qty 1

## 2023-10-04 MED ORDER — ARIPIPRAZOLE 5 MG PO TABS
5.0000 mg | ORAL_TABLET | Freq: Every day | ORAL | Status: DC
  Administered 2023-10-05 – 2023-10-06 (×2): 5 mg via ORAL
  Filled 2023-10-04 (×2): qty 1

## 2023-10-04 MED ORDER — LEVOTHYROXINE SODIUM 100 MCG PO TABS
100.0000 ug | ORAL_TABLET | Freq: Every day | ORAL | Status: DC
  Administered 2023-10-05 – 2023-10-07 (×3): 100 ug via ORAL
  Filled 2023-10-04 (×3): qty 1

## 2023-10-04 MED ORDER — ACETAMINOPHEN 325 MG PO TABS
650.0000 mg | ORAL_TABLET | Freq: Four times a day (QID) | ORAL | Status: DC | PRN
  Administered 2023-10-05 (×2): 650 mg via ORAL
  Filled 2023-10-04 (×2): qty 2

## 2023-10-04 MED ORDER — BISACODYL 5 MG PO TBEC: 5.0000 mg | DELAYED_RELEASE_TABLET | Freq: Every day | ORAL | Status: DC | PRN

## 2023-10-04 MED ORDER — ALBUTEROL SULFATE (2.5 MG/3ML) 0.083% IN NEBU: 2.5000 mg | INHALATION_SOLUTION | Freq: Four times a day (QID) | RESPIRATORY_TRACT | Status: DC | PRN

## 2023-10-04 MED ORDER — ALBUTEROL SULFATE (2.5 MG/3ML) 0.083% IN NEBU
3.0000 mL | INHALATION_SOLUTION | Freq: Three times a day (TID) | RESPIRATORY_TRACT | Status: DC
  Administered 2023-10-05: 3 mL via RESPIRATORY_TRACT
  Filled 2023-10-04: qty 3

## 2023-10-04 MED ORDER — DIVALPROEX SODIUM 125 MG PO CSDR
125.0000 mg | DELAYED_RELEASE_CAPSULE | Freq: Two times a day (BID) | ORAL | Status: DC
Start: 2023-10-04 — End: 2023-10-07
  Administered 2023-10-04 – 2023-10-06 (×5): 125 mg via ORAL
  Filled 2023-10-04 (×5): qty 1

## 2023-10-04 MED ORDER — ENOXAPARIN SODIUM 40 MG/0.4ML IJ SOSY: 40.0000 mg | PREFILLED_SYRINGE | INTRAMUSCULAR | Status: DC

## 2023-10-04 MED ORDER — FLUTICASONE FUROATE-VILANTEROL 50-25 MCG/ACT IN AEPB: 1.0000 | INHALATION_SPRAY | Freq: Every morning | RESPIRATORY_TRACT | Status: DC

## 2023-10-04 MED ORDER — ATORVASTATIN CALCIUM 40 MG PO TABS
40.0000 mg | ORAL_TABLET | Freq: Every day | ORAL | Status: DC
  Administered 2023-10-05 – 2023-10-06 (×2): 40 mg via ORAL
  Filled 2023-10-04 (×2): qty 1

## 2023-10-04 MED ORDER — MELATONIN 5 MG PO TABS
15.0000 mg | ORAL_TABLET | Freq: Every day | ORAL | Status: DC
  Administered 2023-10-04 – 2023-10-06 (×3): 15 mg via ORAL
  Filled 2023-10-04 (×3): qty 3

## 2023-10-04 MED ORDER — OXYCODONE HCL 5 MG PO TABS
5.0000 mg | ORAL_TABLET | ORAL | Status: DC | PRN
  Administered 2023-10-05 – 2023-10-06 (×2): 5 mg via ORAL
  Filled 2023-10-04 (×3): qty 1

## 2023-10-04 MED ORDER — HYDRALAZINE HCL 20 MG/ML IJ SOLN: 10.0000 mg | Freq: Four times a day (QID) | INTRAMUSCULAR | Status: DC | PRN

## 2023-10-04 MED ORDER — ENOXAPARIN SODIUM 80 MG/0.8ML IJ SOSY
80.0000 mg | PREFILLED_SYRINGE | Freq: Two times a day (BID) | INTRAMUSCULAR | Status: DC
  Administered 2023-10-04 – 2023-10-05 (×2): 80 mg via SUBCUTANEOUS
  Filled 2023-10-04 (×4): qty 0.8

## 2023-10-04 MED ORDER — MORPHINE SULFATE (PF) 2 MG/ML IV SOLN
1.0000 mg | INTRAVENOUS | Status: DC | PRN
  Administered 2023-10-05 (×2): 1 mg via INTRAVENOUS
  Filled 2023-10-04 (×2): qty 1
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
## 2023-10-04 NOTE — ED Notes (Signed)
2nd attempt to try calling report- no answer

## 2023-10-04 NOTE — ED Provider Notes (Signed)
[facility] Provider Note   CSN: [id number] Arrival date & time: 10/04/23  1359     History  Chief Complaint  Patient presents with   Leg Injury    Suzann A Voltz is a 89 y.o. female.  HPI Patient presents for femur fracture.  Medical history includes CVA, anemia, HTN, arthritis, osteoporosis, bipolar disorder, depression.  Due to her prior CVA, she has right hemibody weakness.  She requires Hoyer lift for transfers.  She was undergoing transfer with a Hoyer lift.  She states that the battery ran out and she was hanging.  She had a fall where her right leg was twisted underneath her.  She has since had pain and swelling to that leg.  She was seen in the ED 2 weeks ago and diagnosed with popliteal DVT.  She was started on Eliquis.  She underwent an x-ray earlier today at her facility.  They noted a distal femur fracture.  She was sent to the ED for further evaluation.  Patient arrives with her guardian and power of attorney.  Patient is under guardianship of a private company.  Guardian feels that patient needs higher level of care.  She request to speak to a Child psychotherapist.    Home Medications Prior to Admission medications   Medication Sig Start Date End Date Taking? Authorizing Provider  amLODipine (NORVASC) 5 MG tablet Take 1 tablet (5 mg total) by mouth daily. 04/05/19   Bradd Canary, MD  APIXABAN Everlene Balls) VTE STARTER PACK (10MG  AND 5MG ) Take as directed on package: start with two-5mg  tablets twice daily for 7 days. On day 8, switch to one-5mg  tablet twice daily. 09/22/23   Elayne Snare K, DO  ARIPiprazole (ABILIFY) 5 MG tablet Take 1 tablet (5 mg total) by mouth daily. 12/05/18   Angiulli, Mcarthur Rossetti, PA-C  aspirin EC 81 MG EC tablet Take 1 tablet (81 mg total) by mouth daily. 11/20/18   Danford, Earl Lites, MD  atorvastatin (LIPITOR) 40 MG tablet Take 1 tablet (40 mg total) by mouth daily at 6 PM. 11/20/18   Danford, Earl Lites,  MD  cephALEXin (KEFLEX) 500 MG capsule 1 cap po bid x 7 days 12/15/20   Muthersbaugh, Dahlia Client, PA-C  cholecalciferol (VITAMIN D) 1000 UNITS tablet Take 3,000 Units by mouth daily.     [provider]  ciprofloxacin (CIPRO) 250 MG tablet Take 1 tablet (250 mg total) by mouth 2 (two) times daily. 03/12/20   Saguier, Ramon Dredge, PA-C  clopidogrel (PLAVIX) 75 MG tablet Take 1 tablet (75 mg total) by mouth daily. 11/20/18   Danford, Earl Lites, MD  furosemide (LASIX) 20 MG tablet Take 1 tablet (20 mg total) by mouth daily. 12/06/18   Angiulli, Mcarthur Rossetti, PA-C  guaiFENesin (ROBITUSSIN) 100 MG/5ML liquid Take 5 mLs (100 mg total) by mouth 4 (four) times daily as needed for cough. 04/18/21   Pricilla Loveless, MD  levothyroxine (SYNTHROID) 88 MCG tablet TAKE 1 TABLET BY MOUTH EVERY DAY 08/20/19   Bradd Canary, MD  Melatonin 5 MG TABS Take 3 tablets (15 mg total) by mouth at bedtime. 12/05/18   Angiulli, Mcarthur Rossetti, PA-C  multivitamin (PROSIGHT) TABS tablet Take 1 tablet by mouth daily. 12/06/18   Angiulli, Mcarthur Rossetti, PA-C  potassium chloride SA (K-DUR,KLOR-CON) 20 MEQ tablet Take 1 tablet (20 mEq total) by mouth daily. 12/05/18   Angiulli, Mcarthur Rossetti, PA-C  senna-docusate (SENOKOT-S) 8.6-50 MG tablet Take 2 tablets by mouth 2 (two)  times daily. 12/05/18   Angiulli, Mcarthur Rossetti, PA-C  Triamcinolone Acetonide (TRIAMCINOLONE 0.1 % CREAM : EUCERIN) CREA Apply 1 application topically daily as needed. 05/25/19   Bradd Canary, MD  triamcinolone cream (KENALOG) 0.1 % Apply 1 application topically daily as needed. 05/22/19   Bradd Canary, MD      Allergies    Bupropion hcl    Review of Systems   Review of Systems  Musculoskeletal:  Positive for arthralgias and joint swelling.  All other systems reviewed and are negative.   Physical Exam Updated Vital Signs BP 111/61   Pulse 86   Temp 97.9 F (36.6 C)   Resp 18   SpO2 97%  Physical Exam Vitals and nursing note reviewed.  Constitutional:      General: She is  not in acute distress.    Appearance: Normal appearance. She is well-developed. She is not ill-appearing, toxic-appearing or diaphoretic.  HENT:     Head: Normocephalic and atraumatic.     Right Ear: External ear normal.     Left Ear: External ear normal.     Nose: Nose normal.     Mouth/Throat:     Mouth: Mucous membranes are moist.  Eyes:     Extraocular Movements: Extraocular movements intact.     Conjunctiva/sclera: Conjunctivae normal.  Cardiovascular:     Rate and Rhythm: Normal rate and regular rhythm.  Pulmonary:     Effort: Pulmonary effort is normal. No respiratory distress.  Abdominal:     General: There is no distension.     Palpations: Abdomen is soft.  Musculoskeletal:        General: Swelling and tenderness present.     Cervical back: Normal range of motion and neck supple.  Skin:    General: Skin is warm and dry.     Coloration: Skin is not jaundiced or pale.  Neurological:     Mental Status: She is alert and oriented to person, place, and time.     Comments: Chronic right hemibody deficits from prior CVA  Psychiatric:        Mood and Affect: Mood normal.        Behavior: Behavior normal.     ED Results / Procedures / Treatments   Labs (all labs ordered are listed, but only abnormal results are displayed) Labs Reviewed  BASIC METABOLIC PANEL  CBC WITH DIFFERENTIAL/PLATELET    EKG None  Radiology No results found.  Procedures Procedures    Medications Ordered in ED Medications  oxyCODONE-acetaminophen (PERCOCET/ROXICET) 5-325 MG per tablet 1 tablet (has no administration in time range)    ED Course/ Medical Decision Making/ A&P                                 Medical Decision Making  This patient presents to the ED for concern of right leg pain, this involves an extensive number of treatment options, and is a complaint that carries with it a high risk of complications and morbidity.  The differential diagnosis includes fracture, worsening  DVT, infection, myalgia, hematoma   Co morbidities that complicate the patient evaluation  CVA, anemia, HTN, arthritis, osteoporosis, bipolar disorder, depression   Additional history obtained:  Additional history obtained from patient's guardian External records from outside source obtained and reviewed including EMR   Lab Tests:  I Ordered, and personally interpreted labs.  The pertinent results include: (Pending at time of signout)  Imaging Studies ordered:  I ordered imaging studies including x-ray of right hip, femur, knee I independently visualized and interpreted imaging which showed (pending at time of signout) I agree with the radiologist interpretation   Cardiac Monitoring: / EKG:  The patient was maintained on a cardiac monitor.  I personally viewed and interpreted the cardiac monitored which showed an underlying rhythm of: Sinus rhythm  Problem List / ED Course / Critical interventions / Medication management  Patient resenting for right leg pain and swelling.  She underwent x-ray at her facility earlier today which showed a distal femur fracture.  She does have a history of right knee replacement.  This was done 7 years ago by Dr. Magnus Ivan with [facility] orthopedics.  On exam, she does have swelling and tenderness to area of right knee.  X-ray imaging was ordered.  Percocet was ordered for analgesia.  Per review of facility paperwork, last dose of Eliquis was this morning.  Patient's guardian requested to speak with social work.  TOC consult was ordered.  Care of patient was signed out to oncoming ED provider. I ordered medication including Percocet for analgesia Reevaluation of the patient after these medicines showed that the patient improved I have reviewed the patients home medicines and have made adjustments as needed   Social Determinants of Health:  Resides in nursing facility        Final Clinical Impression(s) / ED Diagnoses Final diagnoses:   Acute pain of right knee    Rx / DC Orders ED Discharge Orders     None         Gloris Manchester, MD 10/04/23 1439

## 2023-10-04 NOTE — Consult Note (Signed)
Consultation Note  Date of consultation: 10/04/2023  Reason for consultation: Right distal femur periprosthetic fracture Consulting provider: Doran Durand, MD/EDP  HPI: The patient is an 89 year old female who stays at [facility] assisted living facility.  She has a history of a stroke affecting her right side and she does not walk.  Over a week ago apparently she had some kind of accident involving a Hoyer lift at the facility.  This lift is required for transfers.  Somehow her right leg twisted underneath her.  She has had some pain since then.  However x-ray was not obtained until today at the facility and it showed a periprosthetic distal femur fracture around a previous total knee arthroplasty.  She was thus sent to the [facility] emergency room for further evaluation treatment.  Recently she was seen in the emergency department and found to have a popliteal DVT.  I believe this was of the right lower extremity and she was started on Eliquis which she has been on up until today.  I did speak to the patient at the bedside.  She does follow commands appropriately.  She has not walked in a long period of time and states that her right knee remains in a flexed position.  She says that the pain she is experiencing right now is tolerable except when they are lifting her with the [facility] lift.  It does get more painful then.  She actually appears comfortable right now laying in the stretcher.  She is able to use her right upper extremity she states somewhat.  She does not have any family members and states there is someone that helps with her decision making from a medical standpoint.  She states that she would rather not have any type of surgery right now.  Exam: Fortunately examination of her right thigh, knee and leg showed no soft tissue compromise at all.  I can gently flex and extend her right knee but her arc of motion is quite limited.  She does not exhibit severe pain when I tried to put her  through some motion of that knee and hip on the right side.  The pain was not excruciating.  There is swelling and bruising about her right knee that is consistent with a subacute injury.  Again, there is no soft tissue compromise currently around the distal femur or knee on that right side.  X-rays: I did review the x-rays of her right knee.  There is a distal femur fracture that involves the femoral prosthesis.  It appears to be impacted but there is some displacement.  There is no sharp edge of bone they can be seen on plain films that would cause any soft tissue compromise.  Assessment: Right distal femur closed periprosthetic fracture  Recommendations/plan: As of now, this complex fracture will likely be treated nonoperative given the fact that the patient does not walk, her knee stays in more of a flexed position, and there is no soft tissue compromise with her right knee as a relates to the fracture.  I do feel it is appropriate to transfer her to [facility] for the evening and I may get the opinion of my orthopedic colleagues in terms of the potential for any surgical intervention as a relates to this fracture.  My feeling is that this will likely be treated nonoperative.  She does have significant comorbidities.  As for now, she can have a regular diet and I will likely order a CT scan for tomorrow to  assess her distal femur further.  There is a good chance that we will recommend resuming her Eliquis and working on a return to [facility] in the next 1 to 2 days.

## 2023-10-04 NOTE — ED Notes (Signed)
[facility] notified for transport 

## 2023-10-04 NOTE — Progress Notes (Signed)
PHARMACY - ANTICOAGULATION CONSULT NOTE  Pharmacy Consult for enoxaparin Indication: bridge therapy while Eliquis for popliteal vein DVT dx 09/22/23 on hold for possible surgery  Allergies  Allergen Reactions   Bupropion Hcl Other (See Comments)    "Allergic," per [facility]    Patient Measurements:   Heparin Dosing Weight:   Vital Signs: Temp: 97.9 F (36.6 C) (12/17 1419) BP: 111/61 (12/17 1419) Pulse Rate: 86 (12/17 1419)  Labs: Recent Labs    10/04/23 1559  HGB 8.3*  HCT 26.8*  PLT 543*  CREATININE 0.91    CrCl cannot be calculated (Unknown ideal weight.).   Medical History: Past Medical History:  Diagnosis Date   Anemia    h/o fibroids   Bipolar disorder (HCC)    Depression    bipolar   Eczema    H/O stroke within last year 11/19/2018   HTN (hypertension)    Hypothyroidism    Insomnia    Osteoarthritis    Osteoporosis    Psoriasis     Assessment: 89 yo F on apixaban 5 mg po bid for DVT diagnosed 09/22/23 - last dose 10/04/23 at 07 am.  Pharmacy consulted to dose enoxaparin as bridge therapy while apixaban on hold for possible surgery.  Hg 8.3, PLT 543, SCr 0.91   Goal of Therapy:  Anti-Xa level 0.6-1 units/ml 4hrs after LMWH dose given Monitor platelets by anticoagulation protocol: Yes   Plan:  Enoxaparin 1 mg/kg sq q12h = 80 mg q12h CBC q 72 hrs F/u renal function & timing of possible OR  Herby Abraham, Pharm.D Use secure chat for questions 10/04/2023 7:41 PM

## 2023-10-04 NOTE — H&P (Signed)
[facility] History and Physical  [name] [name] QIO:[id number] DOB: 04-23-1933 DOA: 10/04/2023 PCP: Patient, No Pcp Per  Presented from: [facility] facility Chief Complaint: Possible right femur fracture  History of Present Illness: [name] [name] is a 89 y.o. female with PMH significant for CVA with right-sided deficits, DVT on Eliquis, HTN, HLD, hypothyroidism, bipolar disorder, depression, osteoarthritis.  At baseline, she is bedbound and is moved out of bed with a Nurse, adult.   2 weeks ago, she had an accident with a Hoyer lift and she dropped down to the floor and her legs were caught beneath her.  She developed bruising and swelling of the right leg.   There was a concern of DVT and hence she was brought to the ED on 12/5.  At the time ultrasound duplex of right leg showed age-indeterminate DVT of right popliteal vein.  She was started on Eliquis and discharged back to ALF. Following that, she also developed progressive pain.   Earlier today 12/17, she had an x-ray done at the facility which showed a distal femur fracture and hence she was sent to the ED.  In the ED, patient was afebrile, heart rate in 80s, blood pressure in 100s, breathing on room air Labs showed WC count 11.7, hemoglobin 9.4, BUN/creatinine 14/1.1 On exam in the ED, she did not have right leg pedal edema and tenderness. Right femur x-ray showed displaced periprosthetic fracture about the femoral component of the right knee arthroplasty. No acute fracture of the pelvis or right hip.  EDP discussed with orthopedics Dr. Magnus Ivan who was not certain about the indication of surgical intervention but recommended admission to [facility] for any possible need of surgery after discussion with the team and after Eliquis washout. [facility] consulted for in-hospital medical management.  At the time of my evaluation, patient was propped up in bed.  Not in distress.  Able to meaningful conversation.   History  reviewed and detailed as below.   Legal guardian was not present at bedside at the time of my evaluation  Review of Systems:  All systems were reviewed and were negative unless otherwise mentioned in the HPI   Past medical history: Past Medical History:  Diagnosis Date   Anemia    h/o fibroids   Bipolar disorder (HCC)    Depression    bipolar   Eczema    H/O stroke within last year 11/19/2018   HTN (hypertension)    Hypothyroidism    Insomnia    Osteoarthritis    Osteoporosis    Psoriasis     Past surgical history: Past Surgical History:  Procedure Laterality Date   ABDOMINAL HYSTERECTOMY  1982   APPENDECTOMY  2003   BREAST LUMPECTOMY  1947   left   CATARACT EXTRACTION  2010   EYE SURGERY     HERNIA REPAIR     KNEE ARTHROSCOPY  09/07/11   right knee   LOOP RECORDER INSERTION N/A 11/20/2018   Procedure: LOOP RECORDER INSERTION;  Surgeon: Marinus Maw, MD;  Location: [facility] INVASIVE CV LAB;  Service: Cardiovascular;  Laterality: N/A;   LOOP RECORDER INSERTION  11/20/2018   Procedure: LOOP RECORDER INSERTION;  Surgeon: Jake Bathe, MD;  Location: [facility] ENDOSCOPY;  Service: Cardiovascular;;   NASAL SEPTUM SURGERY  1970's   TEE WITHOUT CARDIOVERSION N/A 11/20/2018   Procedure: TRANSESOPHAGEAL ECHOCARDIOGRAM (TEE);  Surgeon: Jake Bathe, MD;  Location: [facility] ENDOSCOPY;  Service: Cardiovascular;  Laterality: N/A;   TONSILLECTOMY  [phone]  TONSILLECTOMY     TOTAL KNEE ARTHROPLASTY Right 03/04/2015   Procedure: RIGHT TOTAL KNEE ARTHROPLASTY;  Surgeon: Kathryne Hitch, MD;  Location: [facility] OR;  Service: Orthopedics;  Laterality: Right;   VENTRAL HERNIA REPAIR  2005    Social History:  reports that she quit smoking about 38 years ago. She has never used smokeless tobacco. She reports that she does not drink alcohol and does not use drugs.  Allergies:  Allergies  Allergen Reactions   Bupropion Hcl Other (See Comments)    "Allergic," per MAR   Bupropion hcl   Family  history:  Family History  Problem Relation Age of Onset   Colon cancer Brother    Lung cancer Father    Prostate cancer Father    Arthritis Father    Cancer Father        lung, prostate   Heart disease Brother    Cancer Brother        lung   Lung cancer Brother    Aortic aneurysm Brother    Cancer Other        colon     Physical Exam: Vitals:   10/04/23 1419  BP: 111/61  Pulse: 86  Resp: 18  Temp: 97.9 F (36.6 C)  SpO2: 97%   Wt Readings from Last 3 Encounters:  09/22/23 81.6 kg  04/18/21 68 kg  03/12/20 68 kg   There is no height or weight on file to calculate BMI.  General exam: Pleasant elderly Caucasian female.  Not in distress Skin: No rashes, lesions or ulcers. HEENT: Atraumatic, normocephalic, no obvious bleeding Lungs: Clear to auscultation bilaterally CVS: Regular rate and rhythm, no murmur GI/Abd soft, nontender, nondistended, bowel submitted CNS: Alert, awake, oriented to place and person, slow to respond but able to answer questions Psychiatry: Mood appropriate Extremities: Puffy both lower extremities, right more than left.  No pitting edema.  No calf tenderness   ----------------------------------------------------------------------------------------------------------------------------------------- ----------------------------------------------------------------------------------------------------------------------------------------- -----------------------------------------------------------------------------------------------------------------------------------------  Assessment/Plan: Principal Problem:   Closed fracture of right distal femur (HCC) Active Problems:   Periprosthetic fracture around internal prosthetic right knee joint  Closed fracture of right distal femur Secondary to a Hoyer left accident Orthopedic Dr. Magnus Ivan was consulted Given patient's bedbound status, surgical intervention may be needed.  Per Dr. Magnus Ivan, he will  obtain CT scan of the femur to better assess. Will keep Eliquis on hold Continue pain management  Recent right popliteal vein DVT Diagnosed 2 weeks ago and was started on Eliquis.  Currently on hold for potential need of surgery. For the time being, I will switch her to full dose Lovenox  H/o CVA with right-sided deficits HLD PTA meds- aspirin, Plavix, statin. Continue statin.  I would hold aspirin and Plavix in preparation of surgery  Hypertension PTA meds- Lasix 40 mg daily, amlodipine 5 mg daily Blood pressure currently stable.  Keep meds on hold  Hypothyroidism Continue Synthroid  Bipolar disorder Depression PTA meds- Abilify, Depakote, Aricept, trazodone Resume all   Mobility: Chronically bedbound  Goals of care:   Code Status: Limited: Do not attempt resuscitation (DNR) -DNR-LIMITED -Do Not Intubate/DNI     DVT prophylaxis: Lovenox subcu    Antimicrobials: None Fluid: None Consultants: Orthopedics Family Communication: Legal guardian not at bedside  Dispo: The patient is from: ALF              Anticipated d/c is to: Patient is under legal guardianship of a private company.  [place] and ED social worker discussed about patient's disposition.  Legal guardian requested for higher level of care.  Diet: Diet Order             Diet regular Room service appropriate? Yes; Fluid consistency: Thin  Diet effective now                    ------------------------------------------------------------------------------------- Severity of Illness: The appropriate patient status for this patient is OBSERVATION. Observation status is judged to be reasonable and necessary in order to provide the required intensity of service to ensure the patient's safety. The patient's presenting symptoms, physical exam findings, and initial radiographic and laboratory data in the context of their medical condition is felt to place them at decreased risk for further clinical  deterioration. Furthermore, it is anticipated that the patient will be medically stable for discharge from the hospital within 2 midnights of admission.  -------------------------------------------------------------------------------------  Home Meds: Prior to Admission medications   Medication Sig Start Date End Date Taking? Authorizing Provider  amLODipine (NORVASC) 5 MG tablet Take 1 tablet (5 mg total) by mouth daily. 04/05/19   Bradd Canary, MD  APIXABAN Everlene Balls) VTE STARTER PACK (10MG  AND 5MG ) Take as directed on package: start with two-5mg  tablets twice daily for 7 days. On day 8, switch to one-5mg  tablet twice daily. 09/22/23   Elayne Snare K, DO  ARIPiprazole (ABILIFY) 5 MG tablet Take 1 tablet (5 mg total) by mouth daily. 12/05/18   Angiulli, Mcarthur Rossetti, PA-C  aspirin EC 81 MG EC tablet Take 1 tablet (81 mg total) by mouth daily. 11/20/18   Danford, Earl Lites, MD  atorvastatin (LIPITOR) 40 MG tablet Take 1 tablet (40 mg total) by mouth daily at 6 PM. 11/20/18   Danford, Earl Lites, MD  cephALEXin (KEFLEX) 500 MG capsule 1 cap po bid x 7 days 12/15/20   Muthersbaugh, Dahlia Client, PA-C  cholecalciferol (VITAMIN D) 1000 UNITS tablet Take 3,000 Units by mouth daily.     [provider]  ciprofloxacin (CIPRO) 250 MG tablet Take 1 tablet (250 mg total) by mouth 2 (two) times daily. 03/12/20   Saguier, Ramon Dredge, PA-C  clopidogrel (PLAVIX) 75 MG tablet Take 1 tablet (75 mg total) by mouth daily. 11/20/18   Danford, Earl Lites, MD  furosemide (LASIX) 20 MG tablet Take 1 tablet (20 mg total) by mouth daily. 12/06/18   Angiulli, Mcarthur Rossetti, PA-C  guaiFENesin (ROBITUSSIN) 100 MG/5ML liquid Take 5 mLs (100 mg total) by mouth 4 (four) times daily as needed for cough. 04/18/21   Pricilla Loveless, MD  levothyroxine (SYNTHROID) 88 MCG tablet TAKE 1 TABLET BY MOUTH EVERY DAY 08/20/19   Bradd Canary, MD  Melatonin 5 MG TABS Take 3 tablets (15 mg total) by mouth at bedtime. 12/05/18   Angiulli, Mcarthur Rossetti,  PA-C  multivitamin (PROSIGHT) TABS tablet Take 1 tablet by mouth daily. 12/06/18   Angiulli, Mcarthur Rossetti, PA-C  potassium chloride SA (K-DUR,KLOR-CON) 20 MEQ tablet Take 1 tablet (20 mEq total) by mouth daily. 12/05/18   Angiulli, Mcarthur Rossetti, PA-C  senna-docusate (SENOKOT-S) 8.6-50 MG tablet Take 2 tablets by mouth 2 (two) times daily. 12/05/18   Angiulli, Mcarthur Rossetti, PA-C  Triamcinolone Acetonide (TRIAMCINOLONE 0.1 % CREAM : EUCERIN) CREA Apply 1 application topically daily as needed. 05/25/19   Bradd Canary, MD  triamcinolone cream (KENALOG) 0.1 % Apply 1 application topically daily as needed. 05/22/19   Bradd Canary, MD    Labs on Admission:   CBC: Recent Labs  Lab 10/04/23 1559  WBC  11.0*  NEUTROABS 7.5  HGB 8.3*  HCT 26.8*  MCV 95.4  PLT 543*    Basic Metabolic Panel: Recent Labs  Lab 10/04/23 1559  NA 140  K 4.0  CL 105  CO2 23  GLUCOSE 100*  BUN 17  CREATININE 0.91  CALCIUM 8.9    Liver Function Tests: No results for input(s): "AST", "ALT", "ALKPHOS", "BILITOT", "PROT", "ALBUMIN" in the last 168 hours. No results for input(s): "LIPASE", "AMYLASE" in the last 168 hours. No results for input(s): "AMMONIA" in the last 168 hours.  Cardiac Enzymes: No results for input(s): "CKTOTAL", "CKMB", "CKMBINDEX", "TROPONINI" in the last 168 hours.  BNP (last 3 results) No results for input(s): "BNP" in the last 8760 hours.  ProBNP (last 3 results) No results for input(s): "PROBNP" in the last 8760 hours.  CBG: No results for input(s): "GLUCAP" in the last 168 hours.  Lipase  No results found for: "LIPASE"   Urinalysis    Component Value Date/Time   COLORURINE YELLOW 12/15/2020 0252   APPEARANCEUR HAZY (A) 12/15/2020 0252   LABSPEC 1.006 12/15/2020 0252   PHURINE 8.0 12/15/2020 0252   GLUCOSEU NEGATIVE 12/15/2020 0252   GLUCOSEU NEGATIVE 03/22/2017 1607   HGBUR LARGE (A) 12/15/2020 0252   HGBUR moderate 01/04/2011 1323   BILIRUBINUR NEGATIVE 12/15/2020 0252    BILIRUBINUR negative 03/12/2020 1500   KETONESUR NEGATIVE 12/15/2020 0252   PROTEINUR NEGATIVE 12/15/2020 0252   UROBILINOGEN 0.2 03/12/2020 1500   UROBILINOGEN 0.2 03/22/2017 1607   NITRITE NEGATIVE 12/15/2020 0252   LEUKOCYTESUR NEGATIVE 12/15/2020 0252     Drugs of Abuse  No results found for: "LABOPIA", "COCAINSCRNUR", "LABBENZ", "AMPHETMU", "THCU", "LABBARB"    Radiological Exams on Admission: DG Hip Unilat W or Wo Pelvis 2-3 Views Right Result Date: 10/04/2023 CLINICAL DATA:  Pain after fall. Femur fracture on outside radiograph. EXAM: DG HIP (WITH OR WITHOUT PELVIS) 2-3V RIGHT; RIGHT KNEE - 1-2 VIEW; RIGHT FEMUR 2 VIEWS COMPARISON:  None Available. FINDINGS: Pelvis and right hip: The bones are subjectively under mineralized. No evidence of acute fracture. Femoral head is seated in the acetabulum, no hip dislocation. Pubic rami are intact. No pubic symphyseal or sacroiliac diastasis. Bilateral sacroiliac degenerative change. Large amount of stool in the rectum. Femur and knee: Upper femur included on above hip exam. The bones are subjectively under mineralized. Right knee arthroplasty. Displaced periprosthetic fracture about the femoral component, mildly comminuted. Distal fracture fragments are displaced laterally with respect to the femoral shaft. There is a moderate knee joint effusion. No additional fracture of the knee. IMPRESSION: 1. Displaced periprosthetic fracture about the femoral component of the right knee arthroplasty. 2. No acute fracture of the pelvis or right hip. Electronically Signed   By: Narda Rutherford M.D.   On: 10/04/2023 17:15   DG Knee 2 Views Right Result Date: 10/04/2023 CLINICAL DATA:  Pain after fall. Femur fracture on outside radiograph. EXAM: DG HIP (WITH OR WITHOUT PELVIS) 2-3V RIGHT; RIGHT KNEE - 1-2 VIEW; RIGHT FEMUR 2 VIEWS COMPARISON:  None Available. FINDINGS: Pelvis and right hip: The bones are subjectively under mineralized. No evidence of acute  fracture. Femoral head is seated in the acetabulum, no hip dislocation. Pubic rami are intact. No pubic symphyseal or sacroiliac diastasis. Bilateral sacroiliac degenerative change. Large amount of stool in the rectum. Femur and knee: Upper femur included on above hip exam. The bones are subjectively under mineralized. Right knee arthroplasty. Displaced periprosthetic fracture about the femoral component, mildly comminuted. Distal fracture fragments are  displaced laterally with respect to the femoral shaft. There is a moderate knee joint effusion. No additional fracture of the knee. IMPRESSION: 1. Displaced periprosthetic fracture about the femoral component of the right knee arthroplasty. 2. No acute fracture of the pelvis or right hip. Electronically Signed   By: Narda Rutherford M.D.   On: 10/04/2023 17:15   DG FEMUR, MIN 2 VIEWS RIGHT Result Date: 10/04/2023 CLINICAL DATA:  Pain after fall. Femur fracture on outside radiograph. EXAM: DG HIP (WITH OR WITHOUT PELVIS) 2-3V RIGHT; RIGHT KNEE - 1-2 VIEW; RIGHT FEMUR 2 VIEWS COMPARISON:  None Available. FINDINGS: Pelvis and right hip: The bones are subjectively under mineralized. No evidence of acute fracture. Femoral head is seated in the acetabulum, no hip dislocation. Pubic rami are intact. No pubic symphyseal or sacroiliac diastasis. Bilateral sacroiliac degenerative change. Large amount of stool in the rectum. Femur and knee: Upper femur included on above hip exam. The bones are subjectively under mineralized. Right knee arthroplasty. Displaced periprosthetic fracture about the femoral component, mildly comminuted. Distal fracture fragments are displaced laterally with respect to the femoral shaft. There is a moderate knee joint effusion. No additional fracture of the knee. IMPRESSION: 1. Displaced periprosthetic fracture about the femoral component of the right knee arthroplasty. 2. No acute fracture of the pelvis or right hip. Electronically Signed   By:  Narda Rutherford M.D.   On: 10/04/2023 17:15     Signed, Lorin Glass, MD [facility] 10/04/2023

## 2023-10-04 NOTE — ED Notes (Signed)
Attempted to call report to [facility], no answer from RN

## 2023-10-04 NOTE — ED Provider Notes (Signed)
Care of patient received from prior provider at 5:56 PM, please see their note for complete H/P and care plan.  Received handoff per ED course.  Clinical Course as of 10/04/23 1756  Tue Oct 04, 2023  1525 Stable BIBA from assisted living.  Possible left femur. On eliquis.  Full 2 person assist. Facility reporting FTT.  [facility] did knee.   [CC]    Clinical Course User Index [CC] Glyn Ade, MD    Reassessment: Dr. Magnus Ivan recommended admission to [facility] hospitals so that Ortho trauma can be involved. Discussed with medicine who is in agreement.Disposition:   Based on the above findings, I believe this patient is stable for admission.    Patient/family educated about specific findings on our evaluation and explained exact reasons for admission.  Patient/family educated about clinical situation and time was allowed to answer questions.   Admission team communicated with and agreed with need for admission. Patient admitted. Patient  ready to move at this time.     Emergency Department Medication Summary:   Medications  oxyCODONE-acetaminophen (PERCOCET/ROXICET) 5-325 MG per tablet 1 tablet (1 tablet Oral Given 10/04/23 1446)            Glyn Ade, MD 10/04/23 1756

## 2023-10-05 ENCOUNTER — Inpatient Hospital Stay (HOSPITAL_COMMUNITY): Payer: Medicare Other

## 2023-10-05 DIAGNOSIS — M9711XA Periprosthetic fracture around internal prosthetic right knee joint, initial encounter: Secondary | ICD-10-CM | POA: Diagnosis not present

## 2023-10-05 DIAGNOSIS — N1831 Chronic kidney disease, stage 3a: Secondary | ICD-10-CM | POA: Diagnosis present

## 2023-10-05 LAB — BASIC METABOLIC PANEL
Anion gap: 11 (ref 5–15)
BUN: 14 mg/dL (ref 8–23)
CO2: 21 mmol/L — ABNORMAL LOW (ref 22–32)
Calcium: 9 mg/dL (ref 8.9–10.3)
Chloride: 106 mmol/L (ref 98–111)
Creatinine, Ser: 1.16 mg/dL — ABNORMAL HIGH (ref 0.44–1.00)
GFR, Estimated: 45 mL/min — ABNORMAL LOW (ref >60)
Glucose, Bld: 98 mg/dL (ref 70–99)
Potassium: 3.8 mmol/L (ref 3.5–5.1)
Sodium: 138 mmol/L (ref 135–145)

## 2023-10-05 LAB — CBC WITH DIFFERENTIAL/PLATELET
Abs Immature Granulocytes: 0.26 10*3/uL — ABNORMAL HIGH (ref 0.00–0.07)
Basophils Absolute: 0.1 10*3/uL (ref 0.0–0.1)
Basophils Relative: 1 %
Eosinophils Absolute: 0 10*3/uL (ref 0.0–0.5)
Eosinophils Relative: 0 %
HCT: 26.3 % — ABNORMAL LOW (ref 36.0–46.0)
Hemoglobin: 8.3 g/dL — ABNORMAL LOW (ref 12.0–15.0)
Immature Granulocytes: 3 %
Lymphocytes Relative: 18 %
Lymphs Abs: 1.8 10*3/uL (ref 0.7–4.0)
MCH: 28.8 pg (ref 26.0–34.0)
MCHC: 31.6 g/dL (ref 30.0–36.0)
MCV: 91.3 fL (ref 80.0–100.0)
Monocytes Absolute: 0.9 10*3/uL (ref 0.1–1.0)
Monocytes Relative: 9 %
Neutro Abs: 6.8 10*3/uL (ref 1.7–7.7)
Neutrophils Relative %: 69 %
Platelets: 580 10*3/uL — ABNORMAL HIGH (ref 150–400)
RBC: 2.88 MIL/uL — ABNORMAL LOW (ref 3.87–5.11)
RDW: 17.2 % — ABNORMAL HIGH (ref 11.5–15.5)
WBC: 9.7 10*3/uL (ref 4.0–10.5)
nRBC: 0 % (ref 0.0–0.2)

## 2023-10-05 MED ORDER — CLOPIDOGREL BISULFATE 75 MG PO TABS
75.0000 mg | ORAL_TABLET | Freq: Every day | ORAL | Status: DC
  Administered 2023-10-05 – 2023-10-06 (×2): 75 mg via ORAL
  Filled 2023-10-05 (×2): qty 1

## 2023-10-05 MED ORDER — AMLODIPINE BESYLATE 5 MG PO TABS
5.0000 mg | ORAL_TABLET | Freq: Every day | ORAL | Status: DC
  Administered 2023-10-05 – 2023-10-06 (×2): 5 mg via ORAL
  Filled 2023-10-05 (×2): qty 1

## 2023-10-05 MED ORDER — FLUTICASONE PROPIONATE 50 MCG/ACT NA SUSP
1.0000 | Freq: Two times a day (BID) | NASAL | Status: DC
  Administered 2023-10-05 – 2023-10-06 (×3): 1 via NASAL
  Filled 2023-10-05: qty 16

## 2023-10-05 MED ORDER — TRAZODONE HCL 50 MG PO TABS
75.0000 mg | ORAL_TABLET | Freq: Every day | ORAL | Status: DC
  Administered 2023-10-05 – 2023-10-06 (×2): 75 mg via ORAL
  Filled 2023-10-05 (×2): qty 2

## 2023-10-05 MED ORDER — ASPIRIN 81 MG PO CHEW
81.0000 mg | CHEWABLE_TABLET | Freq: Every morning | ORAL | Status: DC
  Administered 2023-10-06 – 2023-10-07 (×2): 81 mg via ORAL
  Filled 2023-10-05 (×2): qty 1

## 2023-10-05 MED ORDER — MOMETASONE FURO-FORMOTEROL FUM 100-5 MCG/ACT IN AERO
2.0000 | INHALATION_SPRAY | Freq: Two times a day (BID) | RESPIRATORY_TRACT | Status: DC
  Administered 2023-10-05 – 2023-10-06 (×4): 2 via RESPIRATORY_TRACT
  Filled 2023-10-05: qty 8.8

## 2023-10-05 MED ORDER — LORATADINE 10 MG PO TABS
10.0000 mg | ORAL_TABLET | Freq: Every day | ORAL | Status: DC
  Administered 2023-10-05 – 2023-10-06 (×2): 10 mg via ORAL
  Filled 2023-10-05 (×2): qty 1

## 2023-10-05 MED ORDER — APIXABAN 5 MG PO TABS
5.0000 mg | ORAL_TABLET | Freq: Two times a day (BID) | ORAL | Status: DC
  Administered 2023-10-05 – 2023-10-06 (×3): 5 mg via ORAL
  Filled 2023-10-05 (×3): qty 1

## 2023-10-05 NOTE — Evaluation (Signed)
Physical Therapy Evaluation Patient Details Name: [name] [name] MRN: [id number] DOB: 1932-11-29 Today's Date: 10/05/2023  History of Present Illness  89 y/o F presenting to ED on 12/17 from [facility] ALF after hoyer lift accident on 12/5 where she was dropped down to floor with legs beneath her. Seen in ED that date and was found to have DVT, started on Eliquis and d/c'd. X-ray taken at facility 12/17, and was found to have R distal femur periprosthetic fx and came to ED. Ortho recommending AKA or complicated distal femur replacement, pt declines surgery. PMH includes CVA with R deficits, DVT on Eliquis, HTN, HLD, hypothyroidism, bipolar disorder, depression OA,   Clinical Impression  Pt in bed upon arrival and agreeable to PT eval. Prior to admit, pt would move from bed <> WC via hoyer lift. Pt was able to roll B with slight assistance and reports not walking or sitting at EOB for ~ 2 years. Pt is below previous level of mobility as she was able to roll to the left with MaxA, however, was unable to roll past midline to the right due to pain in R LE. Pt presents to therapy session with decreased B LE ROM/strength and ability to perform bed mobility. Pt would benefit from acute skilled PT to address functional impairments. Recommending post-acute rehab <3 hrs to gain more independence with mobility. Acute PT to follow.          If plan is discharge home, recommend the following: A lot of help with walking and/or transfers;A lot of help with bathing/dressing/bathroom;Assist for transportation;Help with stairs or ramp for entrance   Can travel by private vehicle   No    Equipment Recommendations None recommended by PT (TBD at next venue)     Functional Status Assessment Patient has had a recent decline in their functional status and demonstrates the ability to make significant improvements in function in a reasonable and predictable amount of time.     Precautions / Restrictions  Precautions Precautions: Fall Precaution Comments: R hemi, R femur fx Restrictions Weight Bearing Restrictions Per Provider Order: No      Mobility  Bed Mobility Overal bed mobility: Needs Assistance Bed Mobility: Rolling Rolling: Max assist    General bed mobility comments: max A to roll to L , Pt unable to roll past midline towards the R 2/2 pain    Transfers    General transfer comment: deferred         Balance Overall balance assessment:  (unable to assess)        Pertinent Vitals/Pain Pain Assessment Pain Assessment: Faces Faces Pain Scale: Hurts little more Pain Location: BLE with mobility (R>L) Pain Descriptors / Indicators: Discomfort Pain Intervention(s): Limited activity within patient's tolerance, Monitored during session, Repositioned, RN gave pain meds during session    Home Living Family/patient expects to be discharged to:: Assisted living    Additional Comments: [facility]    Prior Function Prior Level of Function : Needs assist;History of Falls (last six months)       Physical Assist : Mobility (physical);ADLs (physical) Mobility (physical): Bed mobility;Transfers ADLs (physical): Bathing;Dressing;Toileting;IADLs Mobility Comments: staff uses hoyer lift 3x/day to a w/c per pt. Pt reports staff pushes WC ADLs Comments: performs self feeding and grooming tasks without Assist, assist for all other ADLs     Extremity/Trunk Assessment   Upper Extremity Assessment Upper Extremity Assessment: Defer to OT evaluation RUE Deficits / Details: hx of prior CVA, 4/5 globally, R hand with  decr extension in 4th digit, can extend all other digits, but keeps in flexed position at rest RUE Coordination: decreased fine motor;decreased gross motor    Lower Extremity Assessment Lower Extremity Assessment: Generalized weakness;RLE deficits/detail;LLE deficits/detail (severe B hip IR, able to get to neutral with PROM, however unable to hold in neutral.  Knee in flexion w/ limited PROM into extension. Alert to light touch)    Cervical / Trunk Assessment Cervical / Trunk Assessment: Normal  Communication   Communication Communication: Difficulty communicating thoughts/reduced clarity of speech (slow speech) Cueing Techniques: Verbal cues  Cognition Arousal: Alert Behavior During Therapy: WFL for tasks assessed/performed Overall Cognitive Status: No family/caregiver present to determine baseline cognitive functioning      General Comments: oriented, and aware of why she is at the hospital, follows commands with incr time        General Comments General comments (skin integrity, edema, etc.): VSS on RA, dressing on R 5th metatarsal head     PT Assessment Patient needs continued PT services  PT Problem List Decreased strength;Decreased range of motion;Decreased activity tolerance;Decreased balance;Decreased mobility       PT Treatment Interventions Functional mobility training;Therapeutic activities;Therapeutic exercise;Balance training;Neuromuscular re-education;Patient/family education;Wheelchair mobility training    PT Goals (Current goals can be found in the Care Plan section)  Acute Rehab PT Goals Patient Stated Goal: to get better PT Goal Formulation: With patient Time For Goal Achievement: 10/19/23 Potential to Achieve Goals: Fair    Frequency Min 1X/week     Co-evaluation   Reason for Co-Treatment: Complexity of the patient's impairments (multi-system involvement);For patient/therapist safety;To address functional/ADL transfers PT goals addressed during session: Mobility/safety with mobility;Strengthening/ROM         AM-PAC PT "6 Clicks" Mobility  Outcome Measure Help needed turning from your back to your side while in a flat bed without using bedrails?: A Lot Help needed moving from lying on your back to sitting on the side of a flat bed without using bedrails?: Total Help needed moving to and from a bed to a  chair (including a wheelchair)?: Total Help needed standing up from a chair using your arms (e.g., wheelchair or bedside chair)?: Total Help needed to walk in hospital room?: Total Help needed climbing 3-5 steps with a railing? : Total 6 Click Score: 7    End of Session   Activity Tolerance: Patient limited by pain Patient left: in bed;with call bell/phone within reach;with bed alarm set Nurse Communication: Mobility status PT Visit Diagnosis: Muscle weakness (generalized) (M62.81);Difficulty in walking, not elsewhere classified (R26.2)    Time: 2725-3664 PT Time Calculation (min) (ACUTE ONLY): 19 min   Charges:   PT Evaluation $PT Eval Moderate Complexity: 1 Mod   PT General Charges $$ ACUTE PT VISIT: 1 Visit        Hilton Cork, PT, DPT Secure Chat Preferred  Rehab Office [phone]   Arturo Morton Brion Aliment 10/05/2023, 5:05 PM

## 2023-10-05 NOTE — Progress Notes (Signed)
Patient ID: [name] [name], female   DOB: [birth year], 89 y.o.   MRN: [id number] I was able to review the CT scan of the patient's right knee and distal femur.  This is a complicated situation.  The only surgery that would be helpful for her would be either an above-knee amputation or a complicated distal femur replacement with revising the knee replacement.  I talked her in length in detail about both of these.  She is not interested in any type of surgery.  I agree with this as well given the complicated nature of the surgery combined with her very thin bone.  She does not ambulate at all.  Obviously this is going to be painful for her for hopefully just a short run.  Again on exam there is no soft tissue compromise around her right distal femur and right knee.  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  From my standpoint, we will treat this just conservatively with pain management only.  She can be put back on Eliquis and the transitional care team can start looking at return to [facility] at any time from an orthopedic standpoint.

## 2023-10-05 NOTE — Plan of Care (Signed)

## 2023-10-05 NOTE — Plan of Care (Signed)
  Problem: Coping: Goal: Level of anxiety will decrease Outcome: Progressing   Problem: Pain Management: Goal: General experience of comfort will improve Outcome: Progressing   Problem: Safety: Goal: Ability to remain free from injury will improve Outcome: Progressing

## 2023-10-05 NOTE — Progress Notes (Signed)
Patient ID: [name] [name], female   DOB: Jan 24, 1933, 89 y.o.   MRN: [id number] I will order a CT scan today of her right distal femur and knee to better assess the fracture.  She can eat today and I will see her later today when she has the CT scan to come up with the best treatment options and plan for her.  This is most likely going to be treated nonoperative given her low level of function combined with other comorbidities.

## 2023-10-05 NOTE — TOC Initial Note (Addendum)
Transition of Care [facility]) - Initial/Assessment Note    Patient Details  Name: [name] [name] MRN: [id number] Date of Birth: [birth year]  Transition of [facility]) CM/SW Contact:    Lorri Frederick, LCSW Phone Number: 10/05/2023, 1:16 PM  Clinical Narrative:       CSW requested to come to room to talk with legal guardian Kami Sabune.  Lannette Donath is from [employer] [facility] [employer] [place], [place], [phone] and provides the following information: pt son died last year, daughter in law has not remained involved.  Has been in assisted living at [facility] but is behind on payment and also needs higher level of care.  Kami has been in touch with [facility] in [place] about possible STR to LTC plan.  Pt does not currently have medicaid.  Discussed that it is currently unclear if pt will be evaluated for rehab or not.  Kami aware that no plans for surgery.      Kami will be out of office after today, Eliot Ford at [employer] for [employer] will be the contact while she is out, [phone].             1530: [place] PASSR: [phone] [id number]  CSW spoke with Emily/[facility] and rehab.  She confirmed that she has spoken with guardian about this pt.  She would be able to accept pt if she admits as STR.  If she is going to admit as LTC, will need additional information regarding medicaid eligibility.    CSW spoke with Ernest Haber RN at [employer].  Pt has not been ambulatory since [facility] started work there 8 months ago, transfers to wheelchair by [employer].  Discussed rehab vs LTC at SNF, depending on PT/OT eval.  [facility] aware that if pt is not appropriate for rehab, she may need to return while LTC is worked out and they would accept pt back in that case.     Barriers to Discharge: Continued Medical Work up   Patient Goals and CMS Choice            Expected Discharge Plan and Services In-house Referral: Clinical Social Work     Living arrangements for the past 2 months:  Assisted Living Facility [facility])                                      Prior Living Arrangements/Services Living arrangements for the past 2 months: Assisted Living Facility [facility]) Lives with:: Facility Resident          Need for Family Participation in Patient Care: Yes (Comment) Care giver support system in place?: No (comment) Current home services: Other (comment) (na)    Activities of Daily Living   ADL Screening (condition at time of admission) Independently performs ADLs?: No Does the patient have a NEW difficulty with bathing/dressing/toileting/self-feeding that is expected to last >3 days?: No Does the patient have a NEW difficulty with getting in/out of bed, walking, or climbing stairs that is expected to last >3 days?: No Does the patient have a NEW difficulty with communication that is expected to last >3 days?: No Is the patient deaf or have difficulty hearing?: Yes Does the patient have difficulty seeing, even when wearing glasses/contacts?: No Does the patient have difficulty concentrating, remembering, or making decisions?: No  Permission Sought/Granted  Emotional Assessment Appearance:: Appears stated age Attitude/Demeanor/Rapport: Engaged Affect (typically observed): Pleasant Orientation: : Oriented to Self, Oriented to Place, Oriented to  Time, Oriented to Situation      Admission diagnosis:  Hip fracture (HCC) [S72.009A] Acute pain of right knee [M25.561] Patient Active Problem List   Diagnosis Date Noted   Closed fracture of right distal femur (HCC) 10/04/2023   Periprosthetic fracture around internal prosthetic right knee joint 10/04/2023   Hyperlipidemia 08/22/2020   Corns/callosities 08/22/2020   Do not resuscitate 05/22/2019   Dry skin dermatitis 05/22/2019   Right hemiparesis (HCC)    Peripheral edema    HTN (hypertension)    Labile blood pressure    Hypokalemia    Left middle cerebral artery  stroke (HCC) 11/20/2018   H/O: stroke 11/19/2018   Stroke (HCC) 11/18/2018   Stroke (cerebrum) (HCC) 11/18/2018   Myalgia 08/06/2018   Left lower quadrant pain 08/29/2017   Dyspepsia 03/25/2017   Atherosclerosis of aorta (HCC) 09/27/2016   COPD (chronic obstructive pulmonary disease) (HCC) 09/27/2016   Acute blood loss anemia 03/16/2015   Osteoarthritis of right knee 03/04/2015   Status post total right knee replacement 03/04/2015   IBS 02/23/2010   External hemorrhoids 01/01/2010   HEMATURIA UNSPECIFIED 11/04/2009   NAUSEA 06/02/2009   URINARY INCONTINENCE, STRESS, FEMALE 05/09/2009   UTI 04/23/2008   COLONIC POLYPS 12/21/2007   Bipolar disorder (HCC) 12/21/2007   DEVIATED NASAL SEPTUM 12/21/2007   Ventral hernia 12/21/2007   Osteoarthritis 12/21/2007   Hypothyroidism 09/01/2007   ECZEMA 09/01/2007   Psoriasis 09/01/2007   Osteoporosis 09/01/2007   Insomnia 09/01/2007   PCP:  Patient, No Pcp Per Pharmacy:   [facility] [place], [place] - [address] [facility] [address] [facility] [address] [place] [address] Phone: [phone] Fax: [phone]     Social Drivers of Health (SDOH) Social History: SDOH Screenings   Tobacco Use: Medium Risk (10/04/2023)   SDOH Interventions:     Readmission Risk Interventions     No data to display

## 2023-10-05 NOTE — Progress Notes (Signed)
Progress Note   Patient: [name] [name] WUJ:[id number] DOB: 03-05-1933 DOA: 10/04/2023     1 DOS: the patient was seen and examined on 10/05/2023   Brief hospital course: 89yo with h/o CVA with R hemiparesis, HTN, HLD, hypothyroidism, and bipolar d/o who presented from ALF on 12/17 with persistent hip pain.  She is non-ambulatory at her facility and moved with a Hoyer lift and had a fall from the [place] about 2 weeks ago.  Evaluation on 12/5 showed an age-indeterminate DVT of the R popliteal vein and she was started on Eliquis.  She has had progressive pain and imaging at her facility showed concern for a distal femur fracture so she was sent to the ER.  Orthopedics is planning to manage nonoperatively.  Assessment and Plan:  R distal femur fracture Secondary to a Hoyer left accident Orthopedic Dr. Magnus Ivan was consulted Patient is bedbound at baseline After discussions with patient, management will be non-operative OK to resume Eliquis Continue pain management PT/OT consults for possible SNF rehab placement   Recent right popliteal vein DVT Diagnosed 2 weeks ago and was started on Eliquis Changed to full dose Lovenox at time of admission but will resume Eliquis since surgery is not planned   H/o CVA with right-sided deficits Resume ASA, Plavix, Lipitor   Hypertension Resume home amlodipine   Hypothyroidism Continue Synthroid   Bipolar disorder Continue Abilify, Donepezil, trazodone, melatonin  COPD Continue Breo, Albuterol  Stage 3a CKD Appears to be stable at this time Attempt to avoid nephrotoxic medications Recheck BMP in AM   DNR Code status was discussed on admission She will need a gold out of facility DNR form at the time of discharge     Consultants: Orthopedics  Procedures: None  Antibiotics: None  30 Day Unplanned Readmission Risk Score    Flowsheet Row ED to Hosp-Admission (Current) from 10/04/2023 in [facility]  ORTHOPEDICS  30 Day Unplanned Readmission Risk Score (%) 19.57 Filed at 10/05/2023 0801       This score is the patient's risk of an unplanned readmission within 30 days of being discharged (0 -100%). The score is based on dignosis, age, lab data, medications, orders, and past utilization.   Low:  0-14.9   Medium: 15-21.9   High: 22-29.9   Extreme: 30 and above           Subjective: Feeling ok.  Pain is currently well controlled.  She would like a diet.   Objective: Vitals:   10/05/23 0813 10/05/23 0853  BP: 129/60   Pulse: 93 95  Resp: 17 16  Temp: 98.3 F (36.8 C)   SpO2: 96% 96%   No intake or output data in the 24 hours ending 10/05/23 1548 There were no vitals filed for this visit.  Exam:  General:  Appears calm and comfortable and is in NAD Eyes:  EOMI, normal lids, iris ENT:  grossly normal hearing, lips & tongue, mmm Neck:  no LAD, masses or thyromegaly Cardiovascular:  RRR, no m/r/g. No LE edema.  Respiratory:   CTA bilaterally with no wheezes/rales/rhonchi.  Normal respiratory effort. Abdomen:  soft, NT, ND Skin:  no rash or induration seen on limited exam Musculoskeletal:  RLE pain with movement, fairly contracted and muscles are atrophic Psychiatric:  blunted mood and affect, speech fluent and appropriate, AOx3   Data Reviewed: I have reviewed the patient's lab results since admission.  Pertinent labs for today include:   BUN 14/Creatinine 1.16/GFR 45 -  stable WBC 9.7 Hgb 8.3 - stable Platelets 580     Family Communication: None present; TOC team has been in touch with her guardian today  Disposition: Status is: Inpatient Remains inpatient appropriate because: ongoing evaluation and management     Time spent: 50 minutes  Unresulted Labs (From admission, onward)     Start     Ordered   10/06/23 0500  CBC with Differential/Platelet  Tomorrow morning,   R        10/05/23 1547   10/06/23 0500  Basic metabolic panel  Tomorrow morning,   R         10/05/23 1547             Author: Jonah Blue, MD 10/05/2023 3:48 PM  For on call review [email or web address].

## 2023-10-05 NOTE — Hospital Course (Addendum)
89yo with h/o CVA with R hemiparesis, HTN, HLD, hypothyroidism, and bipolar d/o who presented from ALF on 12/17 with persistent hip pain.  She is non-ambulatory at her facility and moved with a Hoyer lift and had a fall from the [place] about 2 weeks ago.  Evaluation on 12/5 showed an age-indeterminate DVT of the R popliteal vein and she was started on Eliquis.  She has had progressive pain and imaging at her facility showed concern for a distal femur fracture so she was sent to the ER.  Orthopedics is planning to manage nonoperatively.

## 2023-10-05 NOTE — Consult Note (Signed)
WOC Nurse Consult Note: Reason for Consult: right heel When I arrived removed dressings from right foot. Patient does not have right heel wound.  Wound type: previous clavus  Right lateral 5th metatarsal head Pressure Injury POA: NA Measurement: 0.4cm x 0.3cm x 0.1cm  Wound bed:100% clean, bleeding Drainage (amount, consistency, odor) bloody Periwound:hyperkeratosis  Dressing procedure/placement/frequency: Cleanse with saline, pat dry Cut to fit small piece of silver hydrofiber, top with foam. Change every other day.   FU with podiatrist outpatient   Discussed POC with patient and bedside nurse.  Re consult if needed, will not follow at this time. Thanks  Oasis [employer]ring [employer], RN,CWOCN, CNS, CWON-AP [phone])

## 2023-10-05 NOTE — Evaluation (Addendum)
Occupational Therapy Evaluation Patient Details Name: [name] [name] MRN: [id number] DOB: 05-09-1933 Today's Date: 10/05/2023   History of Present Illness 89 y/o F presenting to ED on 12/17 from [facility] ALF after hoyer lift accident on 12/5 where she was dropped down to floor with legs beneath her. Seen in ED that date and was found to have DVT, started on Eliquis and d/c'd. X-ray taken at facility 12/17, and pt was found to have R distal femur periprosthetic fx and came to ED. Ortho recommending AKA or complicated distal femur replacement, pt declines surgery. PMH includes CVA with R deficits, DVT on Eliquis, HTN, HLD, hypothyroidism, bipolar disorder, depression OA,   Clinical Impression   Pt transfers via hoyer lift at baseline from bed <> w/c with assist of staff, states she does most ADLs at bed level with staff assist, is able to self feed and perform basic grooming tasks. States staff gets her up to w/c 3x/day. Pt currently needing set up - total A for ADLs, max A for bed mobility and is only able to roll to midline due to pain. Pt with residual R deficits but uses BUE functionally for BADL. Pt presenting with impairments listed below, will follow acutely. Patient will benefit from continued inpatient follow up therapy, <3 hours/day.       If plan is discharge home, recommend the following: Two people to help with walking and/or transfers;A lot of help with bathing/dressing/bathroom;Assistance with cooking/housework;Direct supervision/assist for medications management;Direct supervision/assist for financial management;Assist for transportation    Functional Status Assessment  Patient has had a recent decline in their functional status and demonstrates the ability to make significant improvements in function in a reasonable and predictable amount of time.  Equipment Recommendations  Other (comment) (defer)    Recommendations for Other Services       Precautions / Restrictions  Precautions Precautions: Fall Precaution Comments: R hemi, R femur fx Restrictions Weight Bearing Restrictions Per Provider Order: No      Mobility Bed Mobility Overal bed mobility: Needs Assistance Bed Mobility: Rolling Rolling: Max assist         General bed mobility comments: max A to roll to L , rolls to midline but cannot roll any further to R side 2/2 pain    Transfers                   General transfer comment: deferred      Balance                                           ADL either performed or assessed with clinical judgement   ADL Overall ADL's : Needs assistance/impaired Eating/Feeding: Set up;Sitting Eating/Feeding Details (indicate cue type and reason): takes medication/sips of water Grooming: Set up;Sitting   Upper Body Bathing: Moderate assistance;Bed level   Lower Body Bathing: Maximal assistance;Bed level   Upper Body Dressing : Minimal assistance;Bed level   Lower Body Dressing: Maximal assistance;Bed level   Toilet Transfer: Total assistance;+2 for physical assistance   Toileting- Clothing Manipulation and Hygiene: Total assistance       Functional mobility during ADLs: Total assistance;+2 for physical assistance       Vision   Vision Assessment?: No apparent visual deficits     Perception Perception: Not tested       Praxis Praxis: Not tested  Pertinent Vitals/Pain Pain Assessment Pain Assessment: Faces Pain Score: 4  Faces Pain Scale: Hurts little more Pain Location: BLE with mobility (R>L) Pain Descriptors / Indicators: Discomfort Pain Intervention(s): Limited activity within patient's tolerance, Monitored during session, Repositioned     Extremity/Trunk Assessment Upper Extremity Assessment Upper Extremity Assessment: Generalized weakness;RUE deficits/detail RUE Deficits / Details: hx of prior CVA, 4/5 globally, R hand with decr extension in 4th digit, can extend all other digits, but  keeps in flexed position at rest RUE Coordination: decreased fine motor;decreased gross motor   Lower Extremity Assessment Lower Extremity Assessment: Defer to PT evaluation       Communication Communication Communication: Difficulty communicating thoughts/reduced clarity of speech (slow speech)   Cognition Arousal: Alert Behavior During Therapy: WFL for tasks assessed/performed Overall Cognitive Status: No family/caregiver present to determine baseline cognitive functioning                                 General Comments: oriented, and aware of why she is at the hospital, follows commands with incr time     General Comments  VSS    Exercises     Shoulder Instructions      Home Living Family/patient expects to be discharged to:: Assisted living                                 Additional Comments: [facility]      Prior Functioning/Environment               Mobility Comments: w/c baseline, staff propels, 3x/day in w/c per pt ADLs Comments: performs self feeding and grooming tasks without Assist, assist for all other ADLs        OT Problem List: Decreased strength;Decreased range of motion;Decreased activity tolerance;Impaired balance (sitting and/or standing);Pain;Impaired UE functional use      OT Treatment/Interventions: Self-care/ADL training;Therapeutic exercise;DME and/or AE instruction;Energy conservation;Neuromuscular education;Therapeutic activities;Patient/family education;Balance training    OT Goals(Current goals can be found in the care plan section) Acute Rehab OT Goals Patient Stated Goal: none stated OT Goal Formulation: With patient Time For Goal Achievement: 10/19/23 Potential to Achieve Goals: Fair ADL Goals Pt Will Perform Eating: Independently;sitting;bed level Pt Will Perform Grooming: Independently;sitting;bed level Pt Will Perform Upper Body Bathing: with supervision;sitting;bed level Pt Will  Perform Upper Body Dressing: with supervision;sitting;bed level Pt/caregiver will Perform Home Exercise Program: Both right and left upper extremity;With Supervision;With written HEP provided Additional ADL Goal #1: pt will perform bed mobility (rolling) with min A in prep for bed level ADLs  OT Frequency: Min 1X/week    Co-evaluation              AM-PAC OT "6 Clicks" Daily Activity     Outcome Measure Help from another person eating meals?: A Little Help from another person taking care of personal grooming?: A Little Help from another person toileting, which includes using toliet, bedpan, or urinal?: A Lot Help from another person bathing (including washing, rinsing, drying)?: A Lot Help from another person to put on and taking off regular upper body clothing?: A Lot Help from another person to put on and taking off regular lower body clothing?: Total 6 Click Score: 13   End of Session Nurse Communication: Mobility status  Activity Tolerance: Patient tolerated treatment well Patient left: in bed;with call bell/phone within reach;with bed alarm set  OT  Visit Diagnosis: Unsteadiness on feet (R26.81);Other abnormalities of gait and mobility (R26.89);Muscle weakness (generalized) (M62.81)                Time: 8295-6213 OT Time Calculation (min): 19 min Charges:  OT General Charges $OT Visit: 1 Visit OT Evaluation $OT Eval Moderate Complexity: 1 Mod  [name] [name], OTD, OTR/L SecureChat Preferred Acute Rehab [phone]   [name] [name] 10/05/2023, 4:46 PM

## 2023-10-06 ENCOUNTER — Other Ambulatory Visit: Payer: Self-pay

## 2023-10-06 DIAGNOSIS — M9711XA Periprosthetic fracture around internal prosthetic right knee joint, initial encounter: Secondary | ICD-10-CM | POA: Diagnosis not present

## 2023-10-06 LAB — CBC WITH DIFFERENTIAL/PLATELET
Abs Immature Granulocytes: 0.18 10*3/uL — ABNORMAL HIGH (ref 0.00–0.07)
Basophils Absolute: 0.1 10*3/uL (ref 0.0–0.1)
Basophils Relative: 1 %
Eosinophils Absolute: 0 10*3/uL (ref 0.0–0.5)
Eosinophils Relative: 0 %
HCT: 24.3 % — ABNORMAL LOW (ref 36.0–46.0)
Hemoglobin: 7.6 g/dL — ABNORMAL LOW (ref 12.0–15.0)
Immature Granulocytes: 2 %
Lymphocytes Relative: 19 %
Lymphs Abs: 1.6 10*3/uL (ref 0.7–4.0)
MCH: 28.7 pg (ref 26.0–34.0)
MCHC: 31.3 g/dL (ref 30.0–36.0)
MCV: 91.7 fL (ref 80.0–100.0)
Monocytes Absolute: 0.7 10*3/uL (ref 0.1–1.0)
Monocytes Relative: 9 %
Neutro Abs: 6 10*3/uL (ref 1.7–7.7)
Neutrophils Relative %: 69 %
Platelets: 506 10*3/uL — ABNORMAL HIGH (ref 150–400)
RBC: 2.65 MIL/uL — ABNORMAL LOW (ref 3.87–5.11)
RDW: 17.2 % — ABNORMAL HIGH (ref 11.5–15.5)
WBC: 8.6 10*3/uL (ref 4.0–10.5)
nRBC: 0 % (ref 0.0–0.2)

## 2023-10-06 LAB — BASIC METABOLIC PANEL
Anion gap: 9 (ref 5–15)
BUN: 14 mg/dL (ref 8–23)
CO2: 21 mmol/L — ABNORMAL LOW (ref 22–32)
Calcium: 8.8 mg/dL — ABNORMAL LOW (ref 8.9–10.3)
Chloride: 105 mmol/L (ref 98–111)
Creatinine, Ser: 1.11 mg/dL — ABNORMAL HIGH (ref 0.44–1.00)
GFR, Estimated: 48 mL/min — ABNORMAL LOW (ref >60)
Glucose, Bld: 108 mg/dL — ABNORMAL HIGH (ref 70–99)
Potassium: 3.6 mmol/L (ref 3.5–5.1)
Sodium: 135 mmol/L (ref 135–145)

## 2023-10-06 MED ORDER — OXYCODONE HCL 5 MG PO TABS: 5.0000 mg | ORAL_TABLET | ORAL | 0 refills | Status: AC | PRN

## 2023-10-06 NOTE — Plan of Care (Signed)
  Problem: Education: Goal: Knowledge of General Education information will improve Description: Including pain rating scale, medication(s)/side effects and non-pharmacologic comfort measures 10/06/2023 1019 by Olga Millers, LPN Outcome: Progressing 10/06/2023 1019 by Olga Millers, LPN Outcome: Progressing   Problem: Activity: Goal: Risk for activity intolerance will decrease 10/06/2023 1019 by Olga Millers, LPN Outcome: Progressing 10/06/2023 1019 by Olga Millers, LPN Outcome: Progressing   Problem: Nutrition: Goal: Adequate nutrition will be maintained 10/06/2023 1019 by Olga Millers, LPN Outcome: Progressing 10/06/2023 1019 by Olga Millers, LPN Outcome: Progressing   Problem: Coping: Goal: Level of anxiety will decrease 10/06/2023 1019 by Olga Millers, LPN Outcome: Progressing 10/06/2023 1019 by Olga Millers, LPN Outcome: Progressing   Problem: Pain Management: Goal: General experience of comfort will improve 10/06/2023 1019 by Olga Millers, LPN Outcome: Progressing 10/06/2023 1019 by Olga Millers, LPN Outcome: Progressing   Problem: Safety: Goal: Ability to remain free from injury will improve 10/06/2023 1019 by Olga Millers, LPN Outcome: Progressing 10/06/2023 1019 by Olga Millers, LPN Outcome: Progressing   Problem: Skin Integrity: Goal: Risk for impaired skin integrity will decrease 10/06/2023 1019 by Olga Millers, LPN Outcome: Progressing 10/06/2023 1019 by Olga Millers, LPN Outcome: Progressing

## 2023-10-06 NOTE — Discharge Instructions (Signed)
The patient should be premedicated with pain medications prior to any type of transfer out of her bed with a Hoyer lift.

## 2023-10-06 NOTE — Progress Notes (Signed)
Progress Note   Patient: [name] [name] ZOX:[id number] DOB: April 18, 1933 DOA: 10/04/2023     2 DOS: the patient was seen and examined on 10/06/2023   Brief hospital course: 89yo with h/o CVA with R hemiparesis, HTN, HLD, hypothyroidism, and bipolar d/o who presented from ALF on 12/17 with persistent hip pain.  She is non-ambulatory at her facility and moved with a Hoyer lift and had a fall from the [place] about 2 weeks ago.  Evaluation on 12/5 showed an age-indeterminate DVT of the R popliteal vein and she was started on Eliquis.  She has had progressive pain and imaging at her facility showed concern for a distal femur fracture so she was sent to the ER.  Orthopedics is planning to manage nonoperatively.  Assessment and Plan:  R distal femur fracture Secondary to a Hoyer left accident Orthopedic Dr. Magnus Ivan was consulted Patient is bedbound at baseline After discussions with patient, management will be non-operative OK to resume Eliquis Continue pain management PT/OT consults for possible SNF rehab placement She can go to SNF rehab 12/20   Recent right popliteal vein DVT Diagnosed 2 weeks ago and was started on Eliquis Changed to full dose Lovenox at time of admission but resumed Eliquis since surgery is not planned   H/o CVA with right-sided deficits Resume ASA, Plavix, Lipitor   Hypertension Resume home amlodipine   Hypothyroidism Continue Synthroid   Bipolar disorder Continue Abilify, Donepezil, trazodone, melatonin   COPD Continue Breo, Albuterol   Stage 3a CKD Appears to be stable at this time Attempt to avoid nephrotoxic medications Recheck BMP in AM    DNR Code status was discussed on admission She will need a gold out of facility DNR form at the time of discharge        Consultants: Orthopedics Wound care PT OT TOC team   Procedures: None   Antibiotics: None   30 Day Unplanned Readmission Risk Score    Flowsheet Row ED to Hosp-Admission  (Current) from 10/04/2023 in [facility]  30 Day Unplanned Readmission Risk Score (%) 23.66 Filed at 10/06/2023 1600       This score is the patient's risk of an unplanned readmission within 30 days of being discharged (0 -100%). The score is based on dignosis, age, lab data, medications, orders, and past utilization.   Low:  0-14.9   Medium: 15-21.9   High: 22-29.9   Extreme: 30 and above           Subjective: Feeling ok and really wants to dc to facility ASAP.   Objective: Vitals:   10/06/23 0807 10/06/23 1343  BP: (!) 133/59 (!) 153/90  Pulse: 89 97  Resp: 17 17  Temp: 99 F (37.2 C) 98.8 F (37.1 C)  SpO2: 98% 95%    Intake/Output Summary (Last 24 hours) at 10/06/2023 1747 Last data filed at 10/06/2023 1625 Gross per 24 hour  Intake --  Output 600 ml  Net -600 ml   There were no vitals filed for this visit.  Exam:  General:  Appears calm and comfortable and is in NAD Eyes:  PERRL, EOMI, normal lids, iris ENT:  grossly normal hearing, lips & tongue, mmm Neck:  no LAD, masses or thyromegaly Cardiovascular:  RRR, no m/r/g. No LE edema.  Respiratory:   CTA bilaterally with no wheezes/rales/rhonchi.  Normal respiratory effort. Abdomen:  soft, NT, ND Skin:  no rash or induration seen on limited exam Musculoskeletal:  RLE pain with movement,  fairly contracted and muscles are atrophic Psychiatric:  blunted mood and affect, speech fluent and appropriate, AOx3  Data Reviewed: I have reviewed the patient's lab results since admission.  Pertinent labs for today include:   BUN 14/Creatinine 1.16/GFR 45 - stable WBC 9.7 Hgb 8.3 - stable Platelets 580     Family Communication: None present  Disposition: Status is: Inpatient Remains inpatient appropriate because: needs SNF placement     Time spent: 35 minutes  Unresulted Labs (From admission, onward)     Start     Ordered   10/07/23 0500  CBC with  Differential/Platelet  Tomorrow morning,   R       Question:  Specimen collection method  Answer:  Lab=Lab collect   10/06/23 1747   10/07/23 0500  Basic metabolic panel  Tomorrow morning,   R       Question:  Specimen collection method  Answer:  Lab=Lab collect   10/06/23 1747             Author: Jonah Blue, MD 10/06/2023 5:47 PM  For on call review [email or web address].

## 2023-10-06 NOTE — TOC Progression Note (Signed)
Transition of Care [facility]) - Progression Note    Patient Details  Name: [name] [name] MRN: [id number] Date of Birth: 17-Oct-1933  [facility] of [facility]) CM/SW Contact  Dellie Burns [place], [place] Phone Number: 10/06/2023, 11:04 AM  Clinical Narrative:   PT recommendation for SNF noted. Spoke to Jourd[place] at [facility] who confirmed they are able to admit pt under Medicare and will work with pt's legal guardian for LTC placement if needed. Spoke to pt's guardian Eliot Ford [phone] and she is agreeable to current dc plan. MD updated.   Dellie Burns, MSW, LCSW [phone] (coverage)        Barriers to Discharge: Continued Medical Work up  Expected Discharge Plan and Services In-house Referral: Clinical Social Work     Living arrangements for the past 2 months: Assisted Living Facility ([facility])                                       Social Determinants of Health (SDOH) Interventions SDOH Screenings   Food Insecurity: No Food Insecurity (10/06/2023)  Housing: Unknown (10/06/2023)  Transportation Needs: No Transportation Needs (10/06/2023)  Utilities: Not At Risk (10/06/2023)  Tobacco Use: Medium Risk (10/04/2023)    Readmission Risk Interventions     No data to display

## 2023-10-06 NOTE — NC FL2 (Signed)
[place] MEDICAID FL2 LEVEL OF CARE FORM     IDENTIFICATION  Patient Name: [name] [name] Birthdate: 04-May-1933 Sex: female Admission Date (Current Location): 10/04/2023  [facility] and [place] Number:  Producer, television/film/video and Address:  [facility], [address], [place], [place] [address]      Provider Number: [id number]  Attending Physician Name and Address:  Jonah Blue, MD  Relative Name and Phone Number:       Current Level of Care: Hospital Recommended Level of Care: Skilled Nursing Facility Prior Approval Number:    Date Approved/Denied:   PASRR Number: [id number]  Discharge Plan: SNF    Current Diagnoses: Patient Active Problem List   Diagnosis Date Noted   Chronic kidney disease, stage 3a (HCC) 10/05/2023   Periprosthetic fracture around internal prosthetic right knee joint 10/04/2023   Hyperlipidemia 08/22/2020   Corns/callosities 08/22/2020   Do not resuscitate 05/22/2019   Dry skin dermatitis 05/22/2019   Right hemiparesis (HCC)    Peripheral edema    HTN (hypertension)    Labile blood pressure    Hypokalemia    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   H/O: stroke 11/19/2018   History of CVA with residual deficit 11/18/2018   Stroke (cerebrum) (HCC) 11/18/2018   Myalgia 08/06/2018   Left lower quadrant pain 08/29/2017   Dyspepsia 03/25/2017   Atherosclerosis of aorta (HCC) 09/27/2016   COPD (chronic obstructive pulmonary disease) (HCC) 09/27/2016   Acute blood loss anemia 03/16/2015   Osteoarthritis of right knee 03/04/2015   Status post total right knee replacement 03/04/2015   IBS 02/23/2010   External hemorrhoids 01/01/2010   HEMATURIA UNSPECIFIED 11/04/2009   NAUSEA 06/02/2009   URINARY INCONTINENCE, STRESS, FEMALE 05/09/2009   UTI 04/23/2008   COLONIC POLYPS 12/21/2007   Bipolar disorder (HCC) 12/21/2007   DEVIATED NASAL SEPTUM 12/21/2007   Ventral hernia 12/21/2007   Osteoarthritis  12/21/2007   Hypothyroidism 09/01/2007   ECZEMA 09/01/2007   Psoriasis 09/01/2007   Osteoporosis 09/01/2007   Insomnia 09/01/2007    Orientation RESPIRATION BLADDER Height & Weight     Self, Situation, Place, Time (fluctuating orientation)  Normal Incontinent Weight:   Height:     BEHAVIORAL SYMPTOMS/MOOD NEUROLOGICAL BOWEL NUTRITION STATUS      Incontinent    AMBULATORY STATUS COMMUNICATION OF NEEDS Skin   Extensive Assist Verbally Other (Comment) (see notes)                       Personal Care Assistance Level of Assistance  Bathing, Feeding, Dressing Bathing Assistance: Maximum assistance Feeding assistance: Limited assistance Dressing Assistance: Maximum assistance     Functional Limitations Info  Sight, Hearing, Speech Sight Info: Adequate Hearing Info: Adequate Speech Info: Adequate    SPECIAL CARE FACTORS FREQUENCY  PT (By licensed PT), OT (By licensed OT)                    Contractures Contractures Info: Not present    Additional Factors Info  Code Status Code Status Info: DNR             Current Medications (10/06/2023):  This is the current hospital active medication list Current Facility-Administered Medications  Medication Dose Route Frequency Provider Last Rate Last Admin   acetaminophen (TYLENOL) tablet 650 mg  650 mg Oral Q6H PRN Lorin Glass, MD   650 mg at 10/05/23 2052   Or   acetaminophen (TYLENOL) suppository 650 mg  650 mg Rectal Q6H PRN Dahal, Melina Schools, MD       albuterol (PROVENTIL) (2.5 MG/3ML) 0.083% nebulizer solution 2.5 mg  2.5 mg Nebulization Q6H PRN Dahal, Binaya, MD       amLODipine (NORVASC) tablet 5 mg  5 mg Oral Daily Jonah Blue, MD   5 mg at 10/06/23 0830   apixaban (ELIQUIS) tablet 5 mg  5 mg Oral BID Kathryne Hitch, MD   5 mg at 10/06/23 0831   ARIPiprazole (ABILIFY) tablet 5 mg  5 mg Oral Daily Dahal, Melina Schools, MD   5 mg at 10/06/23 0831   aspirin chewable tablet 81 mg  81 mg Oral q AM Jonah Blue, MD   81 mg at 10/06/23 1191   atorvastatin (LIPITOR) tablet 40 mg  40 mg Oral q1800 Dahal, Melina Schools, MD   40 mg at 10/05/23 1604   bisacodyl (DULCOLAX) EC tablet 5 mg  5 mg Oral Daily PRN Dahal, Melina Schools, MD       clopidogrel (PLAVIX) tablet 75 mg  75 mg Oral Daily Jonah Blue, MD   75 mg at 10/06/23 0831   divalproex (DEPAKOTE SPRINKLE) capsule 125 mg  125 mg Oral BID Lorin Glass, MD   125 mg at 10/06/23 0831   donepezil (ARICEPT) tablet 5 mg  5 mg Oral QHS Dahal, Melina Schools, MD   5 mg at 10/05/23 2052   fluticasone (FLONASE) 50 MCG/ACT nasal spray 1 spray  1 spray Each Nare BID Jonah Blue, MD   1 spray at 10/06/23 4782   hydrALAZINE (APRESOLINE) injection 10 mg  10 mg Intravenous Q6H PRN Dahal, Melina Schools, MD       levothyroxine (SYNTHROID) tablet 100 mcg  100 mcg Oral QAC breakfast Dahal, Melina Schools, MD   100 mcg at 10/06/23 0614   loratadine (CLARITIN) tablet 10 mg  10 mg Oral Daily Jonah Blue, MD   10 mg at 10/06/23 9562   melatonin tablet 15 mg  15 mg Oral QHS Dahal, Melina Schools, MD   15 mg at 10/05/23 2054   mometasone-formoterol (DULERA) 100-5 MCG/ACT inhaler 2 puff  2 puff Inhalation BID Paytes, Austin A, RPH   2 puff at 10/06/23 1308   morphine (PF) 2 MG/ML injection 1 mg  1 mg Intravenous Q4H PRN Dahal, Melina Schools, MD   1 mg at 10/05/23 6578   oxyCODONE (Oxy IR/ROXICODONE) immediate release tablet 5 mg  5 mg Oral Q4H PRN Dahal, Melina Schools, MD   5 mg at 10/06/23 0837   polyethylene glycol (MIRALAX / GLYCOLAX) packet 17 g  17 g Oral Daily PRN Dahal, Melina Schools, MD       traZODone (DESYREL) tablet 75 mg  75 mg Oral Noemi Chapel, MD   75 mg at 10/05/23 2053     Discharge Medications: Please see discharge summary for a list of discharge medications.  Relevant Imaging Results:  Relevant Lab Results:   Additional Information SSN: [id number]  Deatra Robinson, [place]

## 2023-10-06 NOTE — Plan of Care (Signed)
  Problem: Education: Goal: Knowledge of General Education information will improve Description: Including pain rating scale, medication(s)/side effects and non-pharmacologic comfort measures Outcome: Progressing   Problem: Activity: Goal: Risk for activity intolerance will decrease Outcome: Progressing   Problem: Nutrition: Goal: Adequate nutrition will be maintained Outcome: Progressing   Problem: Coping: Goal: Level of anxiety will decrease Outcome: Progressing   Problem: Pain Management: Goal: General experience of comfort will improve Outcome: Progressing   Problem: Safety: Goal: Ability to remain free from injury will improve Outcome: Progressing   Problem: Skin Integrity: Goal: Risk for impaired skin integrity will decrease Outcome: Progressing

## 2023-10-07 DIAGNOSIS — M9711XA Periprosthetic fracture around internal prosthetic right knee joint, initial encounter: Secondary | ICD-10-CM | POA: Diagnosis not present

## 2023-10-07 LAB — CBC WITH DIFFERENTIAL/PLATELET
Abs Immature Granulocytes: 0.22 10*3/uL — ABNORMAL HIGH (ref 0.00–0.07)
Basophils Absolute: 0.1 10*3/uL (ref 0.0–0.1)
Basophils Relative: 1 %
Eosinophils Absolute: 0 10*3/uL (ref 0.0–0.5)
Eosinophils Relative: 0 %
HCT: 24.8 % — ABNORMAL LOW (ref 36.0–46.0)
Hemoglobin: 7.8 g/dL — ABNORMAL LOW (ref 12.0–15.0)
Immature Granulocytes: 2 %
Lymphocytes Relative: 14 %
Lymphs Abs: 1.3 10*3/uL (ref 0.7–4.0)
MCH: 28.6 pg (ref 26.0–34.0)
MCHC: 31.5 g/dL (ref 30.0–36.0)
MCV: 90.8 fL (ref 80.0–100.0)
Monocytes Absolute: 0.9 10*3/uL (ref 0.1–1.0)
Monocytes Relative: 10 %
Neutro Abs: 7 10*3/uL (ref 1.7–7.7)
Neutrophils Relative %: 73 %
Platelets: 524 10*3/uL — ABNORMAL HIGH (ref 150–400)
RBC: 2.73 MIL/uL — ABNORMAL LOW (ref 3.87–5.11)
RDW: 17.7 % — ABNORMAL HIGH (ref 11.5–15.5)
WBC: 9.5 10*3/uL (ref 4.0–10.5)
nRBC: 0 % (ref 0.0–0.2)

## 2023-10-07 LAB — BASIC METABOLIC PANEL
Anion gap: 11 (ref 5–15)
BUN: 12 mg/dL (ref 8–23)
CO2: 21 mmol/L — ABNORMAL LOW (ref 22–32)
Calcium: 8.7 mg/dL — ABNORMAL LOW (ref 8.9–10.3)
Chloride: 103 mmol/L (ref 98–111)
Creatinine, Ser: 0.93 mg/dL (ref 0.44–1.00)
GFR, Estimated: 59 mL/min — ABNORMAL LOW (ref >60)
Glucose, Bld: 112 mg/dL — ABNORMAL HIGH (ref 70–99)
Potassium: 3.7 mmol/L (ref 3.5–5.1)
Sodium: 135 mmol/L (ref 135–145)

## 2023-10-07 NOTE — Plan of Care (Signed)
  Problem: Education: Goal: Knowledge of General Education information will improve Description: Including pain rating scale, medication(s)/side effects and non-pharmacologic comfort measures Outcome: Progressing   Problem: Health Behavior/Discharge Planning: Goal: Ability to manage health-related needs will improve Outcome: Progressing   Problem: Nutrition: Goal: Adequate nutrition will be maintained Outcome: Progressing   Problem: Coping: Goal: Level of anxiety will decrease Outcome: Progressing   Problem: Pain Management: Goal: General experience of comfort will improve Outcome: Progressing   Problem: Safety: Goal: Ability to remain free from injury will improve Outcome: Progressing

## 2023-10-07 NOTE — Discharge Summary (Signed)
Physician Discharge Summary   Patient: [name] [name] MRN: [id number] DOB: [birth year]  Admit date:     10/04/2023  Discharge date: 10/07/23  Discharge Physician: Jonah Blue   PCP: Patient, No Pcp Per   Recommendations at discharge:   You are being discharged to [facility] for rehabilitation  Discharge Diagnoses: Principal Problem:   Periprosthetic fracture around internal prosthetic right knee joint Active Problems:   Hypothyroidism   Bipolar disorder (HCC)   COPD (chronic obstructive pulmonary disease) (HCC)   History of CVA with residual deficit   HTN (hypertension)   Do not resuscitate   Hyperlipidemia   Chronic kidney disease, stage 3a [facility])   Hospital Course: 89yo with h/o CVA with R hemiparesis, HTN, HLD, hypothyroidism, and bipolar d/o who presented from ALF on 12/17 with persistent hip pain.  She is non-ambulatory at her facility and moved with a Hoyer lift and had a fall from the [place] about 2 weeks ago.  Evaluation on 12/5 showed an age-indeterminate DVT of the R popliteal vein and she was started on Eliquis.  She has had progressive pain and imaging at her facility showed concern for a distal femur fracture so she was sent to the ER.  Orthopedics is planning to manage nonoperatively.  Assessment and Plan:  R distal femur fracture Secondary to a Hoyer left accident Orthopedic Dr. Magnus Ivan was consulted Patient is bedbound at baseline After discussions with patient, management will be non-operative Continue pain management PT/OT consults -> approved to go to SNF rehab 12/20   Recent right popliteal vein DVT Diagnosed 2 weeks ago and was started on Eliquis Changed to full dose Lovenox at time of admission but resumed Eliquis since surgery is not planned   H/o CVA with right-sided deficits Resume ASA, Plavix, Lipitor   Hypertension Resume home amlodipine   Hypothyroidism Continue Synthroid   Bipolar disorder Continue Abilify, Donepezil, trazodone,  melatonin   COPD Continue Breo, Albuterol   Stage 3a CKD Appears to be stable at this time Attempt to avoid nephrotoxic medications  Obesity Body mass index is 30.73 kg/m.Marland Kitchen  Weight loss should be encouraged This may have a negative impact on long-term morbidity and mortality  DNR Code status was discussed on admission She will need a gold out of facility DNR form at the time of discharge        Consultants: Orthopedics Wound care PT OT TOC team   Procedures: None   Antibiotics: None    Pain control - [facility] Controlled Substance Reporting System database was reviewed. and patient was instructed, not to drive, operate heavy machinery, perform activities at heights, swimming or participation in water activities or provide baby-sitting services while on Pain, Sleep and Anxiety Medications; until their outpatient Physician has advised to do so again. Also recommended to not to take more than prescribed Pain, Sleep and Anxiety Medications.   Disposition: Skilled nursing facility Diet recommendation:  Regular diet DISCHARGE MEDICATION: Allergies as of 10/07/2023       Reactions   Bupropion Hcl Other (See Comments)   "Allergic," per [facility]        Medication List     TAKE these medications    acetaminophen 500 MG tablet Commonly known as: TYLENOL Take 500-1,000 mg by mouth See admin instructions. Take 1,000 mg by mouth at 9 AM and 5 PM- may take an additional 500 mg twice daily as needed for unresolved pain   albuterol 108 (90 Base) MCG/ACT inhaler Commonly known as: VENTOLIN HFA Inhale 2  puffs into the lungs 3 (three) times daily.   amLODipine 5 MG tablet Commonly known as: NORVASC Take 1 tablet (5 mg total) by mouth daily.   ARIPiprazole 5 MG tablet Commonly known as: ABILIFY Take 1 tablet (5 mg total) by mouth daily.   aspirin 81 MG chewable tablet Chew 81 mg by mouth in the morning.   atorvastatin 40 MG tablet Commonly known as: LIPITOR Take  1 tablet (40 mg total) by mouth daily at 6 PM. What changed:  how much to take when to take this   Breo Ellipta 50-25 MCG/INH Aepb Generic drug: Fluticasone Furoate-Vilanterol Inhale 1 puff into the lungs in the morning.   cetirizine 10 MG tablet Commonly known as: ZYRTEC Take 10 mg by mouth at bedtime.   clopidogrel 75 MG tablet Commonly known as: PLAVIX Take 1 tablet (75 mg total) by mouth daily.   divalproex 125 MG capsule Commonly known as: DEPAKOTE SPRINKLE Take 125 mg by mouth 2 (two) times daily.   donepezil 5 MG tablet Commonly known as: ARICEPT Take 5 mg by mouth at bedtime.   Eliquis 5 MG Tabs tablet Generic drug: apixaban Take 5 mg by mouth 2 (two) times daily.   fluticasone 50 MCG/ACT nasal spray Commonly known as: FLONASE Place 1 spray into both nostrils 2 (two) times daily.   furosemide 40 MG tablet Commonly known as: LASIX Take 40 mg by mouth in the morning.   levothyroxine 100 MCG tablet Commonly known as: SYNTHROID Take 100 mcg by mouth daily before breakfast.   melatonin 5 MG Tabs Take 3 tablets (15 mg total) by mouth at bedtime.   multivitamin tablet Take 1 tablet by mouth daily with breakfast.   oxyCODONE 5 MG immediate release tablet Commonly known as: Oxy IR/ROXICODONE Take 1 tablet (5 mg total) by mouth every 4 (four) hours as needed for moderate pain (pain score 4-6).   potassium chloride 20 MEQ packet Commonly known as: KLOR-CON Take 20 mEq by mouth See admin instructions. Mix 20 mEq (1 packet) into 4 ounces of water and drink by  mouth every morning   Santyl 250 UNIT/GM ointment Generic drug: collagenase Apply 1 Application topically See admin instructions. Apply a nickel-sized thickness of ointment into the wound base of right foot on Mon/Wed/Fri [facility])   traZODone 50 MG tablet Commonly known as: DESYREL Take 75 mg by mouth at bedtime.   Tussin DM 10-100 MG/5ML liquid Generic drug:  Dextromethorphan-guaiFENesin Take 10 mLs by mouth every 12 (twelve) hours.   Vitamin D3 25 MCG (1000 UT) Caps Take 3,000 Units by mouth in the morning.               Discharge Care Instructions  (From admission, onward)           Start     Ordered   10/07/23 0000  Discharge wound care:       Comments: Cleanse right lateral foot wound with saline daily, pat dry Cut to fit silver hydrofiber (Aquacel Ag+) [place] # [phone] and place over wound bed, top with foam Change every other day beginning 10/09/2023   10/07/23 1120            Follow-up Information     Kathryne Hitch, MD. Schedule an appointment as soon as possible for a visit in 4 week(s).   Specialty: Orthopedic Surgery Contact information: [address] [place] [address] [phone]  Discharge Exam:   Subjective: Pleasant, delighted to go to SNF today.   Objective: Vitals:   10/07/23 0529 10/07/23 0740  BP: 130/62 (!) 124/59  Pulse: 96 95  Resp: 16 16  Temp: 98.5 F (36.9 C) 98.6 F (37 C)  SpO2: 94% 94%    Intake/Output Summary (Last 24 hours) at 10/07/2023 1120 Last data filed at 10/07/2023 1056 Gross per 24 hour  Intake 240 ml  Output 1150 ml  Net -910 ml   Filed Weights   10/06/23 2043 10/07/23 0500  Weight: 81.7 kg 78.7 kg    Exam:  General:  Appears calm and comfortable and is in NAD Eyes:  PERRL, EOMI, normal lids, iris ENT:  grossly normal hearing, lips & tongue, mmm Neck:  no LAD, masses or thyromegaly Cardiovascular:  RRR, no m/r/g. No LE edema.  Respiratory:   CTA bilaterally with no wheezes/rales/rhonchi.  Normal respiratory effort. Abdomen:  soft, NT, ND Skin:  no rash or induration seen on limited exam; bruising around R knee Musculoskeletal:  RLE pain with movement, contracted and muscles are atrophic Psychiatric:  blunted mood and affect, speech fluent and appropriate, AOx3  Data Reviewed: I have reviewed the patient's lab  results since admission.  Pertinent labs for today include:  Glucose 112 BUN 12/Creatinine 0.93/GFR 59 - stable WBC 9.5 Hgb 7.8 - stable Platelets 524     Condition at discharge: stable  The results of significant diagnostics from this hospitalization (including imaging, microbiology, ancillary and laboratory) are listed below for reference.   Imaging Studies: CT KNEE RIGHT WO CONTRAST Result Date: 10/05/2023 CLINICAL DATA:  Distal femur fracture status post fall. History of right total knee arthroplasty. EXAM: CT OF THE RIGHT KNEE WITHOUT CONTRAST TECHNIQUE: Multidetector CT imaging of the right knee was performed according to the standard protocol. Multiplanar CT image reconstructions were also generated. RADIATION DOSE REDUCTION: This exam was performed according to the departmental dose-optimization program which includes automated exposure control, adjustment of the mA and/or kV according to patient size and/or use of iterative reconstruction technique. COMPARISON:  Radiographs 10/04/2023 and 03/04/2015. FINDINGS: Bones/Joint/Cartilage Positioning is limited by the patient's injury. The knee is flexed more than 90 degrees. In addition, there is beam hardening artifact from the previous total knee arthroplasty. As shown on the radiographs, there is a periprosthetic fracture of the distal right femur which is mildly comminuted and moderately displaced medially. The tibial component of the total knee arthroplasty appears intact, and there is no evidence of acute fracture of the patella, proximal tibia or proximal fibula. Moderate-sized joint effusion noted. Ligaments Suboptimally assessed by CT. Muscles and Tendons Generalized muscular atrophy. The quadriceps tendon is largely obscured by artifact from the prior total knee arthroplasty, and its integrity is not confirmed. Soft tissues Soft tissue swelling and ill-defined hematoma surrounding the displaced distal femur fracture. No unexpected  foreign body or soft tissue emphysema. Prominent femoropopliteal atherosclerosis. IMPRESSION: 1. Mildly comminuted and moderately displaced periprosthetic fracture of the distal right femur as described. 2. No evidence of acute fracture of the proximal tibia, patella or fibula. 3. Soft tissue swelling and ill-defined hematoma surrounding the displaced distal femur fracture. 4. The quadriceps tendon is largely obscured by artifact from the prior total knee arthroplasty, and its integrity is not confirmed. Electronically Signed   By: Carey Bullocks M.D.   On: 10/05/2023 11:53   DG Hip Unilat W or Wo Pelvis 2-3 Views Right Result Date: 10/04/2023 CLINICAL DATA:  Pain after fall. Femur fracture on  outside radiograph. EXAM: DG HIP (WITH OR WITHOUT PELVIS) 2-3V RIGHT; RIGHT KNEE - 1-2 VIEW; RIGHT FEMUR 2 VIEWS COMPARISON:  None Available. FINDINGS: Pelvis and right hip: The bones are subjectively under mineralized. No evidence of acute fracture. Femoral head is seated in the acetabulum, no hip dislocation. Pubic rami are intact. No pubic symphyseal or sacroiliac diastasis. Bilateral sacroiliac degenerative change. Large amount of stool in the rectum. Femur and knee: Upper femur included on above hip exam. The bones are subjectively under mineralized. Right knee arthroplasty. Displaced periprosthetic fracture about the femoral component, mildly comminuted. Distal fracture fragments are displaced laterally with respect to the femoral shaft. There is a moderate knee joint effusion. No additional fracture of the knee. IMPRESSION: 1. Displaced periprosthetic fracture about the femoral component of the right knee arthroplasty. 2. No acute fracture of the pelvis or right hip. Electronically Signed   By: Narda Rutherford M.D.   On: 10/04/2023 17:15   DG Knee 2 Views Right Result Date: 10/04/2023 CLINICAL DATA:  Pain after fall. Femur fracture on outside radiograph. EXAM: DG HIP (WITH OR WITHOUT PELVIS) 2-3V RIGHT; RIGHT  KNEE - 1-2 VIEW; RIGHT FEMUR 2 VIEWS COMPARISON:  None Available. FINDINGS: Pelvis and right hip: The bones are subjectively under mineralized. No evidence of acute fracture. Femoral head is seated in the acetabulum, no hip dislocation. Pubic rami are intact. No pubic symphyseal or sacroiliac diastasis. Bilateral sacroiliac degenerative change. Large amount of stool in the rectum. Femur and knee: Upper femur included on above hip exam. The bones are subjectively under mineralized. Right knee arthroplasty. Displaced periprosthetic fracture about the femoral component, mildly comminuted. Distal fracture fragments are displaced laterally with respect to the femoral shaft. There is a moderate knee joint effusion. No additional fracture of the knee. IMPRESSION: 1. Displaced periprosthetic fracture about the femoral component of the right knee arthroplasty. 2. No acute fracture of the pelvis or right hip. Electronically Signed   By: Narda Rutherford M.D.   On: 10/04/2023 17:15   DG FEMUR, MIN 2 VIEWS RIGHT Result Date: 10/04/2023 CLINICAL DATA:  Pain after fall. Femur fracture on outside radiograph. EXAM: DG HIP (WITH OR WITHOUT PELVIS) 2-3V RIGHT; RIGHT KNEE - 1-2 VIEW; RIGHT FEMUR 2 VIEWS COMPARISON:  None Available. FINDINGS: Pelvis and right hip: The bones are subjectively under mineralized. No evidence of acute fracture. Femoral head is seated in the acetabulum, no hip dislocation. Pubic rami are intact. No pubic symphyseal or sacroiliac diastasis. Bilateral sacroiliac degenerative change. Large amount of stool in the rectum. Femur and knee: Upper femur included on above hip exam. The bones are subjectively under mineralized. Right knee arthroplasty. Displaced periprosthetic fracture about the femoral component, mildly comminuted. Distal fracture fragments are displaced laterally with respect to the femoral shaft. There is a moderate knee joint effusion. No additional fracture of the knee. IMPRESSION: 1.  Displaced periprosthetic fracture about the femoral component of the right knee arthroplasty. 2. No acute fracture of the pelvis or right hip. Electronically Signed   By: Narda Rutherford M.D.   On: 10/04/2023 17:15   VAS [place] LOWER EXTREMITY VENOUS (DVT) (7a-7p) Result Date: 09/23/2023  Lower Venous DVT Study Patient Name:  DAEJAH CORDELL  Date of Exam:   09/22/2023 Medical Rec #: [id number]       Accession #:    [id number] Date of Birth: 1933/07/12       Patient Gender: F Patient Age:   78 years Exam Location:  [facility] Procedure:  VAS [place] LOWER EXTREMITY VENOUS (DVT) Referring Phys: Elayne Snare --------------------------------------------------------------------------------  Indications: Pain, and Swelling. Other Indications: Recent hoyer lift accident at care home with knee injury.                    Mobile doppler was positive for DVT. Limitations: Poor patient postioning and severe patient discomfort (screaming when moved or touched near area of concern) and poor ultrasound/tissue interface. Comparison Study: No previous exams Performing Technologist: Jody Hill RVT, RDMS  Examination Guidelines: A complete evaluation includes B-mode imaging, spectral Doppler, color Doppler, and power Doppler as needed of all accessible portions of each vessel. Bilateral testing is considered an integral part of a complete examination. Limited examinations for reoccurring indications may be performed as noted. The reflux portion of the exam is performed with the patient in reverse Trendelenburg.  +--------+---------------+---------+-----------+----------+--------------------+ RIGHT   CompressibilityPhasicitySpontaneityPropertiesThrombus Aging       +--------+---------------+---------+-----------+----------+--------------------+ CFV     Full           Yes      Yes                                       +--------+---------------+---------+-----------+----------+--------------------+ SFJ     Full                                                               +--------+---------------+---------+-----------+----------+--------------------+ FV Prox Full           Yes      Yes                                       +--------+---------------+---------+-----------+----------+--------------------+ FV Mid  Full           Yes      Yes                                       +--------+---------------+---------+-----------+----------+--------------------+ FV                     Yes      Yes                  patent by            Distal                                               color/doppler        +--------+---------------+---------+-----------+----------+--------------------+ PFV     Full                                                              +--------+---------------+---------+-----------+----------+--------------------+ POP     None  No       No                   Age Indeterminate    +--------+---------------+---------+-----------+----------+--------------------+ PTV     Full                                         Not well visualized  +--------+---------------+---------+-----------+----------+--------------------+ PERO    Full                                         Not well visualized  +--------+---------------+---------+-----------+----------+--------------------+   +----+---------------+---------+-----------+----------+--------------+ LEFTCompressibilityPhasicitySpontaneityPropertiesThrombus Aging +----+---------------+---------+-----------+----------+--------------+ CFV Full           Yes      Yes                                 +----+---------------+---------+-----------+----------+--------------+     Summary: RIGHT: - Findings consistent with age indeterminate deep vein thrombosis involving the right popliteal vein. No other obvious evidence of DVT, however exam limited due to patient condition.  - No cystic structure found  in the popliteal fossa.  LEFT: - No evidence of common femoral vein obstruction.   *See table(s) above for measurements and observations. Electronically signed by Heath Lark on 09/23/2023 at 9:37:25 AM.    Final     Microbiology: Results for orders placed or performed during the hospital encounter of 04/18/21  Culture, blood (routine x 2)     Status: None   Collection Time: 04/18/21  8:00 PM   Specimen: BLOOD LEFT HAND  Result Value Ref Range Status   Specimen Description   Final    BLOOD LEFT HAND Performed at [facility], [address], [place], [place] [address]    Special Requests   Final    BOTTLES DRAWN AEROBIC ONLY Blood Culture results may not be optimal due to an inadequate volume of blood received in culture bottles Performed at [facility], [address] [place], [place] [address]    Culture   Final    NO GROWTH 5 DAYS Performed at [facility], [address] [place], [place] [address]    Report Status 04/24/2021 FINAL  Final  Culture, blood (routine x 2)     Status: None   Collection Time: 04/18/21  8:00 PM   Specimen: BLOOD  Result Value Ref Range Status   Specimen Description   Final    BLOOD LEFT ANTECUBITAL Performed at [facility], [address] [place], [place] [address]    Special Requests   Final    BOTTLES DRAWN AEROBIC AND ANAEROBIC Blood Culture adequate volume Performed at [facility], [address], [place], [place] [address]    Culture   Final    NO GROWTH 5 DAYS Performed at [facility], [address], [place], [place] [address]    Report Status 04/24/2021 FINAL  Final  Resp Panel by RT-PCR (Flu A&B, Covid) Nasopharyngeal Swab     Status: None   Collection Time: 04/18/21  8:00 PM   Specimen: Nasopharyngeal Swab; Nasopharyngeal(NP) swabs in vial transport medium  Result Value Ref Range Status   SARS Coronavirus 2 by RT PCR NEGATIVE  NEGATIVE Final    Comment:  (NOTE) SARS-CoV-2 target nucleic acids are NOT DETECTED.  The SARS-CoV-2 RNA is generally detectable in upper respiratory specimens during the acute phase of infection. The lowest concentration of SARS-CoV-2 viral copies this assay can detect is 138 copies/mL. A negative result does not preclude SARS-Cov-2 infection and should not be used as the sole basis for treatment or other patient management decisions. A negative result may occur with  improper specimen collection/handling, submission of specimen other than nasopharyngeal swab, presence of viral mutation(s) within the areas targeted by this assay, and inadequate number of viral copies(<138 copies/mL). A negative result must be combined with clinical observations, patient history, and epidemiological information. The expected result is Negative.  Fact Sheet for Patients:  BloggerCourse.com  Fact Sheet for Healthcare Providers:  SeriousBroker.it  This test is no t yet approved or cleared by the [place] FDA and  has been authorized for detection and/or diagnosis of SARS-CoV-2 by FDA under an Emergency Use Authorization (EUA). This EUA will remain  in effect (meaning this test can be used) for the duration of the COVID-19 declaration under Section 564(b)(1) of the Act, 21 U.S.C.section 360bbb-3(b)(1), unless the authorization is terminated  or revoked sooner.       Influenza A by PCR NEGATIVE NEGATIVE Final   Influenza B by PCR NEGATIVE NEGATIVE Final    Comment: (NOTE) The Xpert Xpress SARS-CoV-2/FLU/RSV plus assay is intended as an aid in the diagnosis of influenza from Nasopharyngeal swab specimens and should not be used as a sole basis for treatment. Nasal washings and aspirates are unacceptable for Xpert Xpress SARS-CoV-2/FLU/RSV testing.  Fact Sheet for Patients: BloggerCourse.com  Fact Sheet for Healthcare  Providers: SeriousBroker.it  This test is not yet approved or cleared by the [place] FDA and has been authorized for detection and/or diagnosis of SARS-CoV-2 by FDA under an Emergency Use Authorization (EUA). This EUA will remain in effect (meaning this test can be used) for the duration of the COVID-19 declaration under Section 564(b)(1) of the Act, 21 U.S.C. section 360bbb-3(b)(1), unless the authorization is terminated or revoked.  Performed at [facility], [address], [place], [place] [address]     Labs: CBC: Recent Labs  Lab 10/04/23 1559 10/05/23 1039 10/06/23 0556 10/07/23 0622  WBC 11.0* 9.7 8.6 9.5  NEUTROABS 7.5 6.8 6.0 7.0  HGB 8.3* 8.3* 7.6* 7.8*  HCT 26.8* 26.3* 24.3* 24.8*  MCV 95.4 91.3 91.7 90.8  PLT 543* 580* 506* 524*   Basic Metabolic Panel: Recent Labs  Lab 10/04/23 1559 10/05/23 1039 10/06/23 0556 10/07/23 0622  NA 140 138 135 135  K 4.0 3.8 3.6 3.7  CL 105 106 105 103  CO2 23 21* 21* 21*  GLUCOSE 100* 98 108* 112*  BUN 17 14 14 12   CREATININE 0.91 1.16* 1.11* 0.93  CALCIUM 8.9 9.0 8.8* 8.7*   Liver Function Tests: No results for input(s): "AST", "ALT", "ALKPHOS", "BILITOT", "PROT", "ALBUMIN" in the last 168 hours. CBG: No results for input(s): "GLUCAP" in the last 168 hours.  Discharge time spent: greater than 30 minutes.  Signed: Jonah Blue, MD [facility] 10/07/2023

## 2023-10-07 NOTE — TOC Transition Note (Signed)
Transition of Care [facility]) - Discharge Note   Patient Details  Name: [name] [name] MRN: [id number] Date of Birth: 27-Feb-1933  Transition of Care [facility]) CM/SW Contact:  Deatra Robinson, [place] Phone Number: 10/07/2023, 11:40 AM   Clinical Narrative: pt for dc to [facility] today. Confirmed with Irving Burton at [facility] they are prepared to admit pt to room 143. Pt's legal guardian Eliot Ford [phone] notified of dc and reports agreeable. RN provided with number for report and PTAR arranged for transport. SW signing off at dc.   Dellie Burns, MSW, LCSW [phone] (coverage)        Final next level of care: Skilled Nursing Facility Barriers to Discharge: Barriers Resolved   Patient Goals and CMS Choice   CMS Medicare.gov Compare Post Acute Care list provided to:: Legal Guardian Choice offered to / list presented to : [facility] POA / Guardian [place] ownership interest in [facility].provided to:: [facility] POA / Guardian    Discharge Placement              Patient chooses bed at: [facility]   Name of family member notified: Dorian Sylvester/legal guardian Patient and family notified of of transfer: 10/07/23  Discharge Plan and Services Additional resources added to the After Visit Summary for   In-house Referral: Clinical Social Work                                   Social Drivers of Health (SDOH) Interventions SDOH Screenings   Food Insecurity: No Food Insecurity (10/06/2023)  Housing: Low Risk  (10/06/2023)  Transportation Needs: No Transportation Needs (10/06/2023)  Utilities: Not At Risk (10/06/2023)  Tobacco Use: Medium Risk (10/04/2023)     Readmission Risk Interventions     No data to display

## 2023-10-07 NOTE — Care Management Important Message (Signed)
Important Message  Patient Details  Name: [name] [name] MRN: [id number] Date of Birth: 1933-08-11   Important Message Given:  Yes - Medicare IM  Patient left prior to IM delivery will mail a copy to the patient home address.    Hilma Steinhilber 10/07/2023, 3:26 PM
